# Patient Record
Sex: Male | Born: 1935 | Race: White | Hispanic: No | Marital: Married | Smoking: Never smoker
Health system: Southern US, Community
[De-identification: ages and names within clinical notes are randomized; demographics above are authoritative.]

## PROBLEM LIST (undated history)

## (undated) ENCOUNTER — Inpatient Hospital Stay: Admission: EM | Payer: Self-pay | Source: Home / Self Care

## (undated) DIAGNOSIS — G4730 Sleep apnea, unspecified: Secondary | ICD-10-CM

## (undated) DIAGNOSIS — I480 Paroxysmal atrial fibrillation: Secondary | ICD-10-CM

## (undated) DIAGNOSIS — Z8709 Personal history of other diseases of the respiratory system: Secondary | ICD-10-CM

## (undated) DIAGNOSIS — I10 Essential (primary) hypertension: Secondary | ICD-10-CM

## (undated) DIAGNOSIS — G4733 Obstructive sleep apnea (adult) (pediatric): Secondary | ICD-10-CM

## (undated) DIAGNOSIS — N189 Chronic kidney disease, unspecified: Secondary | ICD-10-CM

## (undated) DIAGNOSIS — Z5111 Encounter for antineoplastic chemotherapy: Secondary | ICD-10-CM

## (undated) DIAGNOSIS — I5032 Chronic diastolic (congestive) heart failure: Secondary | ICD-10-CM

## (undated) DIAGNOSIS — E7800 Pure hypercholesterolemia, unspecified: Secondary | ICD-10-CM

## (undated) DIAGNOSIS — N400 Enlarged prostate without lower urinary tract symptoms: Secondary | ICD-10-CM

## (undated) DIAGNOSIS — D472 Monoclonal gammopathy: Secondary | ICD-10-CM

## (undated) DIAGNOSIS — I495 Sick sinus syndrome: Secondary | ICD-10-CM

## (undated) DIAGNOSIS — Z79899 Other long term (current) drug therapy: Secondary | ICD-10-CM

## (undated) DIAGNOSIS — I350 Nonrheumatic aortic (valve) stenosis: Secondary | ICD-10-CM

## (undated) DIAGNOSIS — C61 Malignant neoplasm of prostate: Secondary | ICD-10-CM

## (undated) DIAGNOSIS — R011 Cardiac murmur, unspecified: Secondary | ICD-10-CM

## (undated) DIAGNOSIS — T8859XA Other complications of anesthesia, initial encounter: Secondary | ICD-10-CM

## (undated) DIAGNOSIS — Z9989 Dependence on other enabling machines and devices: Secondary | ICD-10-CM

## (undated) DIAGNOSIS — N289 Disorder of kidney and ureter, unspecified: Secondary | ICD-10-CM

## (undated) DIAGNOSIS — C9000 Multiple myeloma not having achieved remission: Secondary | ICD-10-CM

## (undated) DIAGNOSIS — Z8679 Personal history of other diseases of the circulatory system: Secondary | ICD-10-CM

## (undated) HISTORY — DX: Pure hypercholesterolemia, unspecified: E78.00

## (undated) HISTORY — DX: Obstructive sleep apnea (adult) (pediatric): G47.33

## (undated) HISTORY — DX: Monoclonal gammopathy: D47.2

## (undated) HISTORY — DX: Personal history of other diseases of the respiratory system: Z87.09

## (undated) HISTORY — DX: Dependence on other enabling machines and devices: Z99.89

## (undated) HISTORY — DX: Disorder of kidney and ureter, unspecified: N28.9

## (undated) HISTORY — DX: Chronic kidney disease, unspecified: N18.9

## (undated) HISTORY — DX: Encounter for antineoplastic chemotherapy: Z51.11

## (undated) HISTORY — DX: Personal history of other diseases of the circulatory system: Z86.79

## (undated) HISTORY — DX: Malignant neoplasm of prostate: C61

## (undated) HISTORY — DX: Essential (primary) hypertension: I10

## (undated) HISTORY — DX: Benign prostatic hyperplasia without lower urinary tract symptoms: N40.0

## (undated) HISTORY — DX: Chronic diastolic (congestive) heart failure: I50.32

## (undated) HISTORY — DX: Nonrheumatic aortic (valve) stenosis: I35.0

## (undated) HISTORY — DX: Sick sinus syndrome: I49.5

## (undated) HISTORY — DX: Multiple myeloma not having achieved remission: C90.00

## (undated) HISTORY — DX: Other long term (current) drug therapy: Z79.899

## (undated) HISTORY — DX: Paroxysmal atrial fibrillation: I48.0

---

## 1969-10-03 HISTORY — PX: KNEE SURGERY: SHX244

## 1982-10-03 HISTORY — PX: GASTRECTOMY: SHX58

## 2008-03-05 ENCOUNTER — Encounter: Payer: Self-pay | Admitting: Emergency Medicine

## 2008-03-05 ENCOUNTER — Inpatient Hospital Stay (HOSPITAL_COMMUNITY): Admission: EM | Admit: 2008-03-05 | Discharge: 2008-03-09 | Payer: Self-pay | Admitting: General Surgery

## 2008-03-05 ENCOUNTER — Encounter (INDEPENDENT_AMBULATORY_CARE_PROVIDER_SITE_OTHER): Payer: Self-pay | Admitting: Surgery

## 2008-04-14 ENCOUNTER — Ambulatory Visit (HOSPITAL_COMMUNITY): Admission: RE | Admit: 2008-04-14 | Discharge: 2008-04-14 | Payer: Self-pay | Admitting: Nurse Practitioner

## 2009-02-09 ENCOUNTER — Ambulatory Visit (HOSPITAL_COMMUNITY): Admission: RE | Admit: 2009-02-09 | Discharge: 2009-02-09 | Payer: Self-pay | Admitting: Urology

## 2009-03-03 ENCOUNTER — Ambulatory Visit: Admission: RE | Admit: 2009-03-03 | Discharge: 2009-03-03 | Payer: Self-pay | Admitting: Urology

## 2009-03-09 DIAGNOSIS — Z8679 Personal history of other diseases of the circulatory system: Secondary | ICD-10-CM

## 2009-03-09 HISTORY — DX: Personal history of other diseases of the circulatory system: Z86.79

## 2009-03-16 ENCOUNTER — Encounter (INDEPENDENT_AMBULATORY_CARE_PROVIDER_SITE_OTHER): Payer: Self-pay | Admitting: Urology

## 2009-03-16 ENCOUNTER — Inpatient Hospital Stay (HOSPITAL_COMMUNITY): Admission: RE | Admit: 2009-03-16 | Discharge: 2009-03-22 | Payer: Self-pay | Admitting: Urology

## 2009-12-05 ENCOUNTER — Inpatient Hospital Stay (HOSPITAL_COMMUNITY): Admission: EM | Admit: 2009-12-05 | Discharge: 2009-12-11 | Payer: Self-pay | Admitting: Emergency Medicine

## 2009-12-08 ENCOUNTER — Ambulatory Visit: Payer: Self-pay | Admitting: Internal Medicine

## 2009-12-25 ENCOUNTER — Ambulatory Visit: Payer: Self-pay | Admitting: Hematology and Oncology

## 2010-01-06 LAB — MORPHOLOGY
PLT EST: ADEQUATE
RBC Comments: NORMAL

## 2010-01-06 LAB — CBC WITH DIFFERENTIAL/PLATELET
BASO%: 0.3 % (ref 0.0–2.0)
Basophils Absolute: 0 10*3/uL (ref 0.0–0.1)
EOS%: 3.4 % (ref 0.0–7.0)
Eosinophils Absolute: 0.2 10*3/uL (ref 0.0–0.5)
HCT: 43.6 % (ref 38.4–49.9)
HGB: 15 g/dL (ref 13.0–17.1)
LYMPH%: 31.6 % (ref 14.0–49.0)
MCH: 32.6 pg (ref 27.2–33.4)
MCHC: 34.4 g/dL (ref 32.0–36.0)
MCV: 94.7 fL (ref 79.3–98.0)
MONO#: 0.7 10*3/uL (ref 0.1–0.9)
MONO%: 10.1 % (ref 0.0–14.0)
NEUT#: 3.6 10*3/uL (ref 1.5–6.5)
NEUT%: 54.6 % (ref 39.0–75.0)
Platelets: 194 10*3/uL (ref 140–400)
RBC: 4.6 10*6/uL (ref 4.20–5.82)
RDW: 13.4 % (ref 11.0–14.6)
WBC: 6.6 10*3/uL (ref 4.0–10.3)
lymph#: 2.1 10*3/uL (ref 0.9–3.3)

## 2010-01-08 LAB — SPEP & IFE WITH QIG
Albumin ELP: 55.1 % — ABNORMAL LOW (ref 55.8–66.1)
Alpha-1-Globulin: 3.6 % (ref 2.9–4.9)
Alpha-2-Globulin: 8.1 % (ref 7.1–11.8)
Beta 2: 3.2 % (ref 3.2–6.5)
Beta Globulin: 5.2 % (ref 4.7–7.2)
Gamma Globulin: 24.8 % — ABNORMAL HIGH (ref 11.1–18.8)
IgA: 72 mg/dL (ref 68–378)
IgG (Immunoglobin G), Serum: 2550 mg/dL — ABNORMAL HIGH (ref 694–1618)
IgM, Serum: 31 mg/dL — ABNORMAL LOW (ref 60–263)
M-Spike, %: 1.54 g/dL
Total Protein, Serum Electrophoresis: 7.5 g/dL (ref 6.0–8.3)

## 2010-01-08 LAB — COMPREHENSIVE METABOLIC PANEL
ALT: 29 U/L (ref 0–53)
AST: 22 U/L (ref 0–37)
Albumin: 4.2 g/dL (ref 3.5–5.2)
Alkaline Phosphatase: 80 U/L (ref 39–117)
BUN: 19 mg/dL (ref 6–23)
CO2: 20 mEq/L (ref 19–32)
Calcium: 8.9 mg/dL (ref 8.4–10.5)
Chloride: 105 mEq/L (ref 96–112)
Creatinine, Ser: 1.83 mg/dL — ABNORMAL HIGH (ref 0.40–1.50)
Glucose, Bld: 94 mg/dL (ref 70–99)
Potassium: 4.6 mEq/L (ref 3.5–5.3)
Sodium: 135 mEq/L (ref 135–145)
Total Bilirubin: 0.6 mg/dL (ref 0.3–1.2)
Total Protein: 7.5 g/dL (ref 6.0–8.3)

## 2010-01-08 LAB — KAPPA/LAMBDA LIGHT CHAINS
Kappa free light chain: 71.3 mg/dL — ABNORMAL HIGH (ref 0.33–1.94)
Kappa:Lambda Ratio: 44.01 — ABNORMAL HIGH (ref 0.26–1.65)
Lambda Free Lght Chn: 1.62 mg/dL (ref 0.57–2.63)

## 2010-01-08 LAB — BETA 2 MICROGLOBULIN, SERUM: Beta-2 Microglobulin: 2.33 mg/L — ABNORMAL HIGH (ref 1.01–1.73)

## 2010-01-14 LAB — UIFE/LIGHT CHAINS/TP QN, 24-HR UR
Albumin, U: DETECTED
Alpha 1, Urine: DETECTED — AB
Alpha 2, Urine: DETECTED — AB
Beta, Urine: DETECTED — AB
Free Kappa Lt Chains,Ur: 2.32 mg/dL — ABNORMAL HIGH (ref 0.04–1.51)
Free Kappa/Lambda Ratio: 17.85 ratio — ABNORMAL HIGH (ref 0.46–4.00)
Free Lambda Excretion/Day: 1.56 mg/d
Free Lambda Lt Chains,Ur: 0.13 mg/dL (ref 0.08–1.01)
Free Lt Chn Excr Rate: 27.84 mg/d
Gamma Globulin, Urine: DETECTED — AB
Time: 24 hours
Total Protein, Urine-Ur/day: 40 mg/d (ref 10–140)
Total Protein, Urine: 3.3 mg/dL
Volume, Urine: 1200 mL

## 2010-01-21 ENCOUNTER — Other Ambulatory Visit: Admission: RE | Admit: 2010-01-21 | Discharge: 2010-01-21 | Payer: Self-pay | Admitting: Hematology and Oncology

## 2010-02-02 ENCOUNTER — Ambulatory Visit: Payer: Self-pay | Admitting: Hematology and Oncology

## 2010-02-18 ENCOUNTER — Encounter (INDEPENDENT_AMBULATORY_CARE_PROVIDER_SITE_OTHER): Payer: Self-pay | Admitting: Interventional Radiology

## 2010-02-18 ENCOUNTER — Ambulatory Visit (HOSPITAL_COMMUNITY): Admission: RE | Admit: 2010-02-18 | Discharge: 2010-02-19 | Payer: Self-pay | Admitting: Nephrology

## 2010-03-09 ENCOUNTER — Ambulatory Visit: Payer: Self-pay | Admitting: Hematology and Oncology

## 2010-06-09 ENCOUNTER — Ambulatory Visit: Payer: Self-pay | Admitting: Hematology and Oncology

## 2010-06-09 LAB — CBC WITH DIFFERENTIAL/PLATELET
BASO%: 0.1 % (ref 0.0–2.0)
Basophils Absolute: 0 10*3/uL (ref 0.0–0.1)
EOS%: 1.7 % (ref 0.0–7.0)
Eosinophils Absolute: 0.1 10*3/uL (ref 0.0–0.5)
HCT: 47.4 % (ref 38.4–49.9)
HGB: 15.8 g/dL (ref 13.0–17.1)
LYMPH%: 37.7 % (ref 14.0–49.0)
MCH: 31.6 pg (ref 27.2–33.4)
MCHC: 33.4 g/dL (ref 32.0–36.0)
MCV: 94.6 fL (ref 79.3–98.0)
MONO#: 0.5 10*3/uL (ref 0.1–0.9)
MONO%: 7 % (ref 0.0–14.0)
NEUT#: 3.7 10*3/uL (ref 1.5–6.5)
NEUT%: 53.5 % (ref 39.0–75.0)
Platelets: 218 10*3/uL (ref 140–400)
RBC: 5.01 10*6/uL (ref 4.20–5.82)
RDW: 15.5 % — ABNORMAL HIGH (ref 11.0–14.6)
WBC: 6.8 10*3/uL (ref 4.0–10.3)
lymph#: 2.6 10*3/uL (ref 0.9–3.3)

## 2010-06-11 LAB — COMPREHENSIVE METABOLIC PANEL
ALT: 18 U/L (ref 0–53)
AST: 19 U/L (ref 0–37)
Albumin: 3.9 g/dL (ref 3.5–5.2)
Alkaline Phosphatase: 85 U/L (ref 39–117)
BUN: 21 mg/dL (ref 6–23)
CO2: 21 mEq/L (ref 19–32)
Calcium: 9.2 mg/dL (ref 8.4–10.5)
Chloride: 106 mEq/L (ref 96–112)
Creatinine, Ser: 1.68 mg/dL — ABNORMAL HIGH (ref 0.40–1.50)
Glucose, Bld: 145 mg/dL — ABNORMAL HIGH (ref 70–99)
Potassium: 4.1 mEq/L (ref 3.5–5.3)
Sodium: 138 mEq/L (ref 135–145)
Total Bilirubin: 0.6 mg/dL (ref 0.3–1.2)
Total Protein: 7.6 g/dL (ref 6.0–8.3)

## 2010-06-11 LAB — SPEP & IFE WITH QIG
Albumin ELP: 57.1 % (ref 55.8–66.1)
Alpha-1-Globulin: 3 % (ref 2.9–4.9)
Alpha-2-Globulin: 7.8 % (ref 7.1–11.8)
Beta 2: 2.1 % — ABNORMAL LOW (ref 3.2–6.5)
Beta Globulin: 5 % (ref 4.7–7.2)
Gamma Globulin: 25 % — ABNORMAL HIGH (ref 11.1–18.8)
IgA: 57 mg/dL — ABNORMAL LOW (ref 68–378)
IgG (Immunoglobin G), Serum: 2670 mg/dL — ABNORMAL HIGH (ref 694–1618)
IgM, Serum: 29 mg/dL — ABNORMAL LOW (ref 60–263)
M-Spike, %: 1.63 g/dL
Total Protein, Serum Electrophoresis: 7.6 g/dL (ref 6.0–8.3)

## 2010-06-11 LAB — KAPPA/LAMBDA LIGHT CHAINS
Kappa free light chain: 79.9 mg/dL — ABNORMAL HIGH (ref 0.33–1.94)
Kappa:Lambda Ratio: 74.67 — ABNORMAL HIGH (ref 0.26–1.65)
Lambda Free Lght Chn: 1.07 mg/dL (ref 0.57–2.63)

## 2010-06-11 LAB — BETA 2 MICROGLOBULIN, SERUM: Beta-2 Microglobulin: 2.85 mg/L — ABNORMAL HIGH (ref 1.01–1.73)

## 2010-10-14 ENCOUNTER — Ambulatory Visit: Payer: Self-pay | Admitting: Hematology and Oncology

## 2010-10-18 LAB — CBC WITH DIFFERENTIAL/PLATELET
BASO%: 0.2 % (ref 0.0–2.0)
Basophils Absolute: 0 10*3/uL (ref 0.0–0.1)
EOS%: 2.9 % (ref 0.0–7.0)
Eosinophils Absolute: 0.2 10*3/uL (ref 0.0–0.5)
HCT: 46.5 % (ref 38.4–49.9)
HGB: 15.7 g/dL (ref 13.0–17.1)
LYMPH%: 38.4 % (ref 14.0–49.0)
MCH: 32.3 pg (ref 27.2–33.4)
MCHC: 33.8 g/dL (ref 32.0–36.0)
MCV: 95.6 fL (ref 79.3–98.0)
MONO#: 0.6 10*3/uL (ref 0.1–0.9)
MONO%: 7.9 % (ref 0.0–14.0)
NEUT#: 3.7 10*3/uL (ref 1.5–6.5)
NEUT%: 50.6 % (ref 39.0–75.0)
Platelets: 216 10*3/uL (ref 140–400)
RBC: 4.86 10*6/uL (ref 4.20–5.82)
RDW: 13.9 % (ref 11.0–14.6)
WBC: 7.3 10*3/uL (ref 4.0–10.3)
lymph#: 2.8 10*3/uL (ref 0.9–3.3)

## 2010-10-20 LAB — KAPPA/LAMBDA LIGHT CHAINS
Kappa free light chain: 88.5 mg/dL — ABNORMAL HIGH (ref 0.33–1.94)
Kappa:Lambda Ratio: 89.39 — ABNORMAL HIGH (ref 0.26–1.65)
Lambda Free Lght Chn: 0.99 mg/dL (ref 0.57–2.63)

## 2010-10-20 LAB — COMPREHENSIVE METABOLIC PANEL
ALT: 23 U/L (ref 0–53)
AST: 22 U/L (ref 0–37)
Albumin: 4.3 g/dL (ref 3.5–5.2)
Alkaline Phosphatase: 86 U/L (ref 39–117)
BUN: 22 mg/dL (ref 6–23)
CO2: 25 mEq/L (ref 19–32)
Calcium: 9.8 mg/dL (ref 8.4–10.5)
Chloride: 104 mEq/L (ref 96–112)
Creatinine, Ser: 1.66 mg/dL — ABNORMAL HIGH (ref 0.40–1.50)
Glucose, Bld: 119 mg/dL — ABNORMAL HIGH (ref 70–99)
Potassium: 4.5 mEq/L (ref 3.5–5.3)
Sodium: 139 mEq/L (ref 135–145)
Total Bilirubin: 0.5 mg/dL (ref 0.3–1.2)
Total Protein: 7.8 g/dL (ref 6.0–8.3)

## 2010-10-20 LAB — SPEP & IFE WITH QIG
Albumin ELP: 56 % (ref 55.8–66.1)
Alpha-1-Globulin: 3.3 % (ref 2.9–4.9)
Alpha-2-Globulin: 7.9 % (ref 7.1–11.8)
Beta 2: 3.1 % — ABNORMAL LOW (ref 3.2–6.5)
Beta Globulin: 5.2 % (ref 4.7–7.2)
Gamma Globulin: 24.5 % — ABNORMAL HIGH (ref 11.1–18.8)
IgA: 68 mg/dL (ref 68–378)
IgG (Immunoglobin G), Serum: 3080 mg/dL — ABNORMAL HIGH (ref 694–1618)
IgM, Serum: 32 mg/dL — ABNORMAL LOW (ref 60–263)
M-Spike, %: 1.61 g/dL
Total Protein, Serum Electrophoresis: 7.8 g/dL (ref 6.0–8.3)

## 2010-10-20 LAB — BETA 2 MICROGLOBULIN, SERUM: Beta-2 Microglobulin: 2.36 mg/L — ABNORMAL HIGH (ref 1.01–1.73)

## 2010-10-20 LAB — LACTATE DEHYDROGENASE: LDH: 161 U/L (ref 94–250)

## 2010-10-22 ENCOUNTER — Other Ambulatory Visit: Payer: Self-pay | Admitting: Hematology and Oncology

## 2010-10-22 DIAGNOSIS — C9000 Multiple myeloma not having achieved remission: Secondary | ICD-10-CM

## 2010-12-20 LAB — CBC
HCT: 36.5 % — ABNORMAL LOW (ref 39.0–52.0)
HCT: 36.8 % — ABNORMAL LOW (ref 39.0–52.0)
HCT: 38.2 % — ABNORMAL LOW (ref 39.0–52.0)
HCT: 39.2 % (ref 39.0–52.0)
HCT: 42.2 % (ref 39.0–52.0)
HCT: 43.7 % (ref 39.0–52.0)
HCT: 44.6 % (ref 39.0–52.0)
Hemoglobin: 12.7 g/dL — ABNORMAL LOW (ref 13.0–17.0)
Hemoglobin: 12.8 g/dL — ABNORMAL LOW (ref 13.0–17.0)
Hemoglobin: 13 g/dL (ref 13.0–17.0)
Hemoglobin: 13.5 g/dL (ref 13.0–17.0)
Hemoglobin: 14.5 g/dL (ref 13.0–17.0)
Hemoglobin: 15.1 g/dL (ref 13.0–17.0)
Hemoglobin: 15.5 g/dL (ref 13.0–17.0)
MCHC: 34.1 g/dL (ref 30.0–36.0)
MCHC: 34.4 g/dL (ref 30.0–36.0)
MCHC: 34.5 g/dL (ref 30.0–36.0)
MCHC: 34.5 g/dL (ref 30.0–36.0)
MCHC: 34.7 g/dL (ref 30.0–36.0)
MCHC: 34.8 g/dL (ref 30.0–36.0)
MCHC: 34.9 g/dL (ref 30.0–36.0)
MCV: 94.4 fL (ref 78.0–100.0)
MCV: 95.4 fL (ref 78.0–100.0)
MCV: 95.6 fL (ref 78.0–100.0)
MCV: 95.7 fL (ref 78.0–100.0)
MCV: 95.8 fL (ref 78.0–100.0)
MCV: 95.8 fL (ref 78.0–100.0)
MCV: 96 fL (ref 78.0–100.0)
Platelets: 163 10*3/uL (ref 150–400)
Platelets: 184 10*3/uL (ref 150–400)
Platelets: 202 10*3/uL (ref 150–400)
Platelets: 213 10*3/uL (ref 150–400)
Platelets: 222 10*3/uL (ref 150–400)
Platelets: 225 10*3/uL (ref 150–400)
Platelets: 253 10*3/uL (ref 150–400)
RBC: 3.81 MIL/uL — ABNORMAL LOW (ref 4.22–5.81)
RBC: 3.85 MIL/uL — ABNORMAL LOW (ref 4.22–5.81)
RBC: 3.98 MIL/uL — ABNORMAL LOW (ref 4.22–5.81)
RBC: 4.09 MIL/uL — ABNORMAL LOW (ref 4.22–5.81)
RBC: 4.42 MIL/uL (ref 4.22–5.81)
RBC: 4.57 MIL/uL (ref 4.22–5.81)
RBC: 4.73 MIL/uL (ref 4.22–5.81)
RDW: 14.2 % (ref 11.5–15.5)
RDW: 14.3 % (ref 11.5–15.5)
RDW: 14.4 % (ref 11.5–15.5)
RDW: 14.5 % (ref 11.5–15.5)
RDW: 14.6 % (ref 11.5–15.5)
RDW: 14.6 % (ref 11.5–15.5)
RDW: 14.7 % (ref 11.5–15.5)
WBC: 10.6 10*3/uL — ABNORMAL HIGH (ref 4.0–10.5)
WBC: 11.1 10*3/uL — ABNORMAL HIGH (ref 4.0–10.5)
WBC: 14.1 10*3/uL — ABNORMAL HIGH (ref 4.0–10.5)
WBC: 6.7 10*3/uL (ref 4.0–10.5)
WBC: 9 10*3/uL (ref 4.0–10.5)
WBC: 9.8 10*3/uL (ref 4.0–10.5)
WBC: 9.9 10*3/uL (ref 4.0–10.5)

## 2010-12-20 LAB — PROTIME-INR
INR: 0.97 (ref 0.00–1.49)
INR: 1.01 (ref 0.00–1.49)
Prothrombin Time: 12.8 seconds (ref 11.6–15.2)
Prothrombin Time: 13.2 seconds (ref 11.6–15.2)

## 2010-12-20 LAB — APTT
aPTT: 31 seconds (ref 24–37)
aPTT: 33 seconds (ref 24–37)

## 2010-12-20 LAB — COMPREHENSIVE METABOLIC PANEL
ALT: 31 U/L (ref 0–53)
AST: 34 U/L (ref 0–37)
Albumin: 3.2 g/dL — ABNORMAL LOW (ref 3.5–5.2)
Alkaline Phosphatase: 74 U/L (ref 39–117)
BUN: 13 mg/dL (ref 6–23)
CO2: 29 mEq/L (ref 19–32)
Calcium: 8.5 mg/dL (ref 8.4–10.5)
Chloride: 107 mEq/L (ref 96–112)
Creatinine, Ser: 1.59 mg/dL — ABNORMAL HIGH (ref 0.4–1.5)
GFR calc Af Amer: 52 mL/min — ABNORMAL LOW (ref >60)
GFR calc non Af Amer: 43 mL/min — ABNORMAL LOW (ref >60)
Glucose, Bld: 99 mg/dL (ref 70–99)
Potassium: 4.5 mEq/L (ref 3.5–5.1)
Sodium: 139 mEq/L (ref 135–145)
Total Bilirubin: 1 mg/dL (ref 0.3–1.2)
Total Protein: 6.5 g/dL (ref 6.0–8.3)

## 2010-12-20 LAB — GLUCOSE, CAPILLARY: Glucose-Capillary: 172 mg/dL — ABNORMAL HIGH (ref 70–99)

## 2010-12-20 LAB — MRSA PCR SCREENING: MRSA by PCR: NEGATIVE

## 2010-12-21 LAB — DIFFERENTIAL
Basophils Absolute: 0 10*3/uL (ref 0.0–0.1)
Basophils Relative: 0 % (ref 0–1)
Eosinophils Absolute: 0.2 10*3/uL (ref 0.0–0.7)
Eosinophils Relative: 3 % (ref 0–5)
Lymphocytes Relative: 34 % (ref 12–46)
Lymphs Abs: 2.2 10*3/uL (ref 0.7–4.0)
Monocytes Absolute: 0.5 10*3/uL (ref 0.1–1.0)
Monocytes Relative: 8 % (ref 3–12)
Neutro Abs: 3.4 10*3/uL (ref 1.7–7.7)
Neutrophils Relative %: 55 % (ref 43–77)

## 2010-12-21 LAB — CBC
HCT: 43 % (ref 39.0–52.0)
Hemoglobin: 14.6 g/dL (ref 13.0–17.0)
MCHC: 33.8 g/dL (ref 30.0–36.0)
MCV: 95.3 fL (ref 78.0–100.0)
Platelets: 244 10*3/uL (ref 150–400)
RBC: 4.52 MIL/uL (ref 4.22–5.81)
RDW: 14.2 % (ref 11.5–15.5)
WBC: 6.3 10*3/uL (ref 4.0–10.5)

## 2010-12-21 LAB — TISSUE HYBRIDIZATION (BONE MARROW)-NCBH

## 2010-12-21 LAB — BONE MARROW EXAM

## 2010-12-21 LAB — CHROMOSOME ANALYSIS, BONE MARROW

## 2010-12-27 LAB — CBC
HCT: 39.3 % (ref 39.0–52.0)
HCT: 39.5 % (ref 39.0–52.0)
HCT: 40 % (ref 39.0–52.0)
HCT: 40.7 % (ref 39.0–52.0)
HCT: 43.4 % (ref 39.0–52.0)
HCT: 50.4 % (ref 39.0–52.0)
Hemoglobin: 13.4 g/dL (ref 13.0–17.0)
Hemoglobin: 13.6 g/dL (ref 13.0–17.0)
Hemoglobin: 13.8 g/dL (ref 13.0–17.0)
Hemoglobin: 13.8 g/dL (ref 13.0–17.0)
Hemoglobin: 14.8 g/dL (ref 13.0–17.0)
Hemoglobin: 17.2 g/dL — ABNORMAL HIGH (ref 13.0–17.0)
MCHC: 33.9 g/dL (ref 30.0–36.0)
MCHC: 33.9 g/dL (ref 30.0–36.0)
MCHC: 34.1 g/dL (ref 30.0–36.0)
MCHC: 34.1 g/dL (ref 30.0–36.0)
MCHC: 34.3 g/dL (ref 30.0–36.0)
MCHC: 34.5 g/dL (ref 30.0–36.0)
MCV: 96.6 fL (ref 78.0–100.0)
MCV: 96.7 fL (ref 78.0–100.0)
MCV: 96.9 fL (ref 78.0–100.0)
MCV: 97.2 fL (ref 78.0–100.0)
MCV: 97.2 fL (ref 78.0–100.0)
MCV: 98.4 fL (ref 78.0–100.0)
Platelets: 121 10*3/uL — ABNORMAL LOW (ref 150–400)
Platelets: 148 K/uL — ABNORMAL LOW (ref 150–400)
Platelets: 150 10*3/uL (ref 150–400)
Platelets: 189 K/uL (ref 150–400)
Platelets: 193 10*3/uL (ref 150–400)
Platelets: 216 10*3/uL (ref 150–400)
RBC: 4.05 MIL/uL — ABNORMAL LOW (ref 4.22–5.81)
RBC: 4.09 MIL/uL — ABNORMAL LOW (ref 4.22–5.81)
RBC: 4.14 MIL/uL — ABNORMAL LOW (ref 4.22–5.81)
RBC: 4.15 MIL/uL — ABNORMAL LOW (ref 4.22–5.81)
RBC: 4.46 MIL/uL (ref 4.22–5.81)
RBC: 5.21 MIL/uL (ref 4.22–5.81)
RDW: 13.3 % (ref 11.5–15.5)
RDW: 13.4 % (ref 11.5–15.5)
RDW: 13.6 % (ref 11.5–15.5)
RDW: 13.7 % (ref 11.5–15.5)
RDW: 13.7 % (ref 11.5–15.5)
RDW: 13.8 % (ref 11.5–15.5)
WBC: 10.9 K/uL — ABNORMAL HIGH (ref 4.0–10.5)
WBC: 15.6 10*3/uL — ABNORMAL HIGH (ref 4.0–10.5)
WBC: 16.9 10*3/uL — ABNORMAL HIGH (ref 4.0–10.5)
WBC: 21 10*3/uL — ABNORMAL HIGH (ref 4.0–10.5)
WBC: 8.9 K/uL (ref 4.0–10.5)
WBC: 9.9 10*3/uL (ref 4.0–10.5)

## 2010-12-27 LAB — DIFFERENTIAL
Basophils Absolute: 0 10*3/uL (ref 0.0–0.1)
Basophils Relative: 0 % (ref 0–1)
Eosinophils Absolute: 0 10*3/uL (ref 0.0–0.7)
Eosinophils Relative: 0 % (ref 0–5)
Lymphocytes Relative: 7 % — ABNORMAL LOW (ref 12–46)
Lymphs Abs: 1.2 10*3/uL (ref 0.7–4.0)
Monocytes Absolute: 0.8 10*3/uL (ref 0.1–1.0)
Monocytes Relative: 5 % (ref 3–12)
Neutro Abs: 14.9 10*3/uL — ABNORMAL HIGH (ref 1.7–7.7)
Neutrophils Relative %: 88 % — ABNORMAL HIGH (ref 43–77)

## 2010-12-27 LAB — CULTURE, BLOOD (ROUTINE X 2)
Culture: NO GROWTH
Culture: NO GROWTH

## 2010-12-27 LAB — URINE CULTURE: Colony Count: 10000

## 2010-12-27 LAB — URINALYSIS, ROUTINE W REFLEX MICROSCOPIC
Bilirubin Urine: NEGATIVE
Glucose, UA: NEGATIVE mg/dL
Ketones, ur: 15 mg/dL — AB
Nitrite: NEGATIVE
Protein, ur: 30 mg/dL — AB
Specific Gravity, Urine: 1.022 (ref 1.005–1.030)
Urobilinogen, UA: 1 mg/dL (ref 0.0–1.0)
pH: 5 (ref 5.0–8.0)

## 2010-12-27 LAB — BASIC METABOLIC PANEL WITH GFR
BUN: 22 mg/dL (ref 6–23)
BUN: 27 mg/dL — ABNORMAL HIGH (ref 6–23)
CO2: 23 meq/L (ref 19–32)
CO2: 25 meq/L (ref 19–32)
Calcium: 8 mg/dL — ABNORMAL LOW (ref 8.4–10.5)
Calcium: 8.4 mg/dL (ref 8.4–10.5)
Chloride: 107 meq/L (ref 96–112)
Chloride: 107 meq/L (ref 96–112)
Creatinine, Ser: 1.57 mg/dL — ABNORMAL HIGH (ref 0.4–1.5)
Creatinine, Ser: 1.86 mg/dL — ABNORMAL HIGH (ref 0.4–1.5)
GFR calc non Af Amer: 36 mL/min — ABNORMAL LOW
GFR calc non Af Amer: 44 mL/min — ABNORMAL LOW
Glucose, Bld: 101 mg/dL — ABNORMAL HIGH (ref 70–99)
Glucose, Bld: 141 mg/dL — ABNORMAL HIGH (ref 70–99)
Potassium: 3.9 meq/L (ref 3.5–5.1)
Potassium: 4.4 meq/L (ref 3.5–5.1)
Sodium: 135 meq/L (ref 135–145)
Sodium: 136 meq/L (ref 135–145)

## 2010-12-27 LAB — CARDIAC PANEL(CRET KIN+CKTOT+MB+TROPI)
CK, MB: 1.8 ng/mL (ref 0.3–4.0)
CK, MB: 2.7 ng/mL (ref 0.3–4.0)
CK, MB: 4.5 ng/mL — ABNORMAL HIGH (ref 0.3–4.0)
Relative Index: 0.6 (ref 0.0–2.5)
Relative Index: 0.8 (ref 0.0–2.5)
Relative Index: 0.8 (ref 0.0–2.5)
Total CK: 233 U/L — ABNORMAL HIGH (ref 7–232)
Total CK: 426 U/L — ABNORMAL HIGH (ref 7–232)
Total CK: 546 U/L — ABNORMAL HIGH (ref 7–232)
Troponin I: 0.13 ng/mL — ABNORMAL HIGH (ref 0.00–0.06)

## 2010-12-27 LAB — POCT I-STAT, CHEM 8
BUN: 21 mg/dL (ref 6–23)
Calcium, Ion: 1.08 mmol/L — ABNORMAL LOW (ref 1.12–1.32)
Chloride: 106 mEq/L (ref 96–112)
Creatinine, Ser: 2 mg/dL — ABNORMAL HIGH (ref 0.4–1.5)
Glucose, Bld: 128 mg/dL — ABNORMAL HIGH (ref 70–99)
HCT: 53 % — ABNORMAL HIGH (ref 39.0–52.0)
Hemoglobin: 18 g/dL — ABNORMAL HIGH (ref 13.0–17.0)
Potassium: 4.4 mEq/L (ref 3.5–5.1)
Sodium: 138 mEq/L (ref 135–145)
TCO2: 23 mmol/L (ref 0–100)

## 2010-12-27 LAB — BASIC METABOLIC PANEL
BUN: 18 mg/dL (ref 6–23)
BUN: 18 mg/dL (ref 6–23)
BUN: 21 mg/dL (ref 6–23)
CO2: 22 mEq/L (ref 19–32)
CO2: 26 mEq/L (ref 19–32)
CO2: 29 mEq/L (ref 19–32)
Calcium: 8.2 mg/dL — ABNORMAL LOW (ref 8.4–10.5)
Calcium: 8.7 mg/dL (ref 8.4–10.5)
Calcium: 9 mg/dL (ref 8.4–10.5)
Chloride: 104 mEq/L (ref 96–112)
Chloride: 111 mEq/L (ref 96–112)
Chloride: 111 mEq/L (ref 96–112)
Creatinine, Ser: 1.52 mg/dL — ABNORMAL HIGH (ref 0.4–1.5)
Creatinine, Ser: 1.65 mg/dL — ABNORMAL HIGH (ref 0.4–1.5)
Creatinine, Ser: 2.14 mg/dL — ABNORMAL HIGH (ref 0.4–1.5)
GFR calc Af Amer: 37 mL/min — ABNORMAL LOW (ref >60)
GFR calc Af Amer: 50 mL/min — ABNORMAL LOW (ref >60)
GFR calc Af Amer: 55 mL/min — ABNORMAL LOW (ref >60)
GFR calc non Af Amer: 31 mL/min — ABNORMAL LOW (ref >60)
GFR calc non Af Amer: 41 mL/min — ABNORMAL LOW (ref >60)
GFR calc non Af Amer: 45 mL/min — ABNORMAL LOW (ref >60)
Glucose, Bld: 156 mg/dL — ABNORMAL HIGH (ref 70–99)
Glucose, Bld: 92 mg/dL (ref 70–99)
Glucose, Bld: 99 mg/dL (ref 70–99)
Potassium: 3.8 mEq/L (ref 3.5–5.1)
Potassium: 3.9 mEq/L (ref 3.5–5.1)
Potassium: 3.9 mEq/L (ref 3.5–5.1)
Sodium: 134 mEq/L — ABNORMAL LOW (ref 135–145)
Sodium: 141 mEq/L (ref 135–145)
Sodium: 144 mEq/L (ref 135–145)

## 2010-12-27 LAB — COMPREHENSIVE METABOLIC PANEL
ALT: 33 U/L (ref 0–53)
AST: 33 U/L (ref 0–37)
Albumin: 3.7 g/dL (ref 3.5–5.2)
Alkaline Phosphatase: 87 U/L (ref 39–117)
BUN: 20 mg/dL (ref 6–23)
CO2: 22 mEq/L (ref 19–32)
Calcium: 8.9 mg/dL (ref 8.4–10.5)
Chloride: 106 mEq/L (ref 96–112)
Creatinine, Ser: 2.03 mg/dL — ABNORMAL HIGH (ref 0.4–1.5)
GFR calc Af Amer: 39 mL/min — ABNORMAL LOW (ref >60)
GFR calc non Af Amer: 32 mL/min — ABNORMAL LOW (ref >60)
Glucose, Bld: 136 mg/dL — ABNORMAL HIGH (ref 70–99)
Potassium: 3.8 mEq/L (ref 3.5–5.1)
Sodium: 135 mEq/L (ref 135–145)
Total Bilirubin: 1.3 mg/dL — ABNORMAL HIGH (ref 0.3–1.2)
Total Protein: 8 g/dL (ref 6.0–8.3)

## 2010-12-27 LAB — RAPID STREP SCREEN (MED CTR MEBANE ONLY): Streptococcus, Group A Screen (Direct): POSITIVE — AB

## 2010-12-27 LAB — PROTIME-INR
INR: 1.29 (ref 0.00–1.49)
Prothrombin Time: 16 s — ABNORMAL HIGH (ref 11.6–15.2)

## 2010-12-27 LAB — TSH: TSH: 1.41 u[IU]/mL (ref 0.350–4.500)

## 2010-12-27 LAB — URINE MICROSCOPIC-ADD ON

## 2010-12-27 LAB — MAGNESIUM: Magnesium: 1.8 mg/dL (ref 1.5–2.5)

## 2010-12-27 LAB — POCT CARDIAC MARKERS
CKMB, poc: <1 ng/mL — ABNORMAL LOW (ref 1.0–8.0)
Myoglobin, poc: 408 ng/mL (ref 12–200)
Troponin i, poc: <0.05 ng/mL (ref 0.00–0.09)

## 2010-12-27 LAB — MRSA PCR SCREENING: MRSA by PCR: NEGATIVE

## 2011-01-10 LAB — BASIC METABOLIC PANEL
BUN: 10 mg/dL (ref 6–23)
BUN: 12 mg/dL (ref 6–23)
BUN: 17 mg/dL (ref 6–23)
BUN: 14 mg/dL (ref 6–23)
BUN: 16 mg/dL (ref 6–23)
BUN: 16 mg/dL (ref 6–23)
CO2: 23 mEq/L (ref 19–32)
CO2: 24 mEq/L (ref 19–32)
CO2: 27 mEq/L (ref 19–32)
CO2: 25 mEq/L (ref 19–32)
CO2: 29 mEq/L (ref 19–32)
CO2: 29 mEq/L (ref 19–32)
Calcium: 9.2 mg/dL (ref 8.4–10.5)
Calcium: 9.2 mg/dL (ref 8.4–10.5)
Calcium: 9.3 mg/dL (ref 8.4–10.5)
Calcium: 8 mg/dL — ABNORMAL LOW (ref 8.4–10.5)
Calcium: 8.4 mg/dL (ref 8.4–10.5)
Calcium: 9.4 mg/dL (ref 8.4–10.5)
Chloride: 104 mEq/L (ref 96–112)
Chloride: 104 mEq/L (ref 96–112)
Chloride: 107 mEq/L (ref 96–112)
Chloride: 104 mEq/L (ref 96–112)
Chloride: 106 mEq/L (ref 96–112)
Chloride: 108 mEq/L (ref 96–112)
Creatinine, Ser: 1.44 mg/dL (ref 0.4–1.5)
Creatinine, Ser: 1.45 mg/dL (ref 0.4–1.5)
Creatinine, Ser: 1.55 mg/dL — ABNORMAL HIGH (ref 0.4–1.5)
Creatinine, Ser: 1.38 mg/dL (ref 0.4–1.5)
Creatinine, Ser: 1.49 mg/dL (ref 0.4–1.5)
Creatinine, Ser: 1.62 mg/dL — ABNORMAL HIGH (ref 0.4–1.5)
GFR calc Af Amer: 54 mL/min — ABNORMAL LOW (ref >60)
GFR calc Af Amer: 58 mL/min — ABNORMAL LOW (ref >60)
GFR calc Af Amer: 58 mL/min — ABNORMAL LOW (ref >60)
GFR calc Af Amer: 51 mL/min — ABNORMAL LOW (ref >60)
GFR calc Af Amer: 56 mL/min — ABNORMAL LOW (ref >60)
GFR calc Af Amer: >60 mL/min (ref >60)
GFR calc non Af Amer: 44 mL/min — ABNORMAL LOW (ref >60)
GFR calc non Af Amer: 48 mL/min — ABNORMAL LOW (ref >60)
GFR calc non Af Amer: 48 mL/min — ABNORMAL LOW (ref >60)
GFR calc non Af Amer: 42 mL/min — ABNORMAL LOW (ref >60)
GFR calc non Af Amer: 46 mL/min — ABNORMAL LOW (ref >60)
GFR calc non Af Amer: 51 mL/min — ABNORMAL LOW (ref >60)
Glucose, Bld: 102 mg/dL — ABNORMAL HIGH (ref 70–99)
Glucose, Bld: 102 mg/dL — ABNORMAL HIGH (ref 70–99)
Glucose, Bld: 116 mg/dL — ABNORMAL HIGH (ref 70–99)
Glucose, Bld: 111 mg/dL — ABNORMAL HIGH (ref 70–99)
Glucose, Bld: 121 mg/dL — ABNORMAL HIGH (ref 70–99)
Glucose, Bld: 146 mg/dL — ABNORMAL HIGH (ref 70–99)
Potassium: 3.2 mEq/L — ABNORMAL LOW (ref 3.5–5.1)
Potassium: 3.4 mEq/L — ABNORMAL LOW (ref 3.5–5.1)
Potassium: 3.6 mEq/L (ref 3.5–5.1)
Potassium: 4.1 mEq/L (ref 3.5–5.1)
Potassium: 4.1 mEq/L (ref 3.5–5.1)
Potassium: 4.3 mEq/L (ref 3.5–5.1)
Sodium: 136 mEq/L (ref 135–145)
Sodium: 136 mEq/L (ref 135–145)
Sodium: 139 mEq/L (ref 135–145)
Sodium: 134 mEq/L — ABNORMAL LOW (ref 135–145)
Sodium: 137 mEq/L (ref 135–145)
Sodium: 140 mEq/L (ref 135–145)

## 2011-01-10 LAB — CARDIAC PANEL(CRET KIN+CKTOT+MB+TROPI)
CK, MB: 2.8 ng/mL (ref 0.3–4.0)
CK, MB: 3.7 ng/mL (ref 0.3–4.0)
Relative Index: 1.4 (ref 0.0–2.5)
Relative Index: 1.6 (ref 0.0–2.5)
Total CK: 204 U/L (ref 7–232)
Total CK: 235 U/L — ABNORMAL HIGH (ref 7–232)
Troponin I: 0.09 ng/mL — ABNORMAL HIGH (ref 0.00–0.06)
Troponin I: 0.15 ng/mL — ABNORMAL HIGH (ref 0.00–0.06)

## 2011-01-10 LAB — COMPREHENSIVE METABOLIC PANEL
ALT: 45 U/L (ref 0–53)
AST: 33 U/L (ref 0–37)
Albumin: 3.5 g/dL (ref 3.5–5.2)
Alkaline Phosphatase: 70 U/L (ref 39–117)
BUN: 8 mg/dL (ref 6–23)
CO2: 26 mEq/L (ref 19–32)
Calcium: 8.3 mg/dL — ABNORMAL LOW (ref 8.4–10.5)
Chloride: 105 mEq/L (ref 96–112)
Creatinine, Ser: 1.4 mg/dL (ref 0.4–1.5)
GFR calc Af Amer: >60 mL/min (ref >60)
GFR calc non Af Amer: 50 mL/min — ABNORMAL LOW (ref >60)
Glucose, Bld: 134 mg/dL — ABNORMAL HIGH (ref 70–99)
Potassium: 3.8 mEq/L (ref 3.5–5.1)
Sodium: 136 mEq/L (ref 135–145)
Total Bilirubin: 1.4 mg/dL — ABNORMAL HIGH (ref 0.3–1.2)
Total Protein: 7 g/dL (ref 6.0–8.3)

## 2011-01-10 LAB — HEMOGLOBIN AND HEMATOCRIT, BLOOD
HCT: 38 % — ABNORMAL LOW (ref 39.0–52.0)
HCT: 44 % (ref 39.0–52.0)
Hemoglobin: 13.4 g/dL (ref 13.0–17.0)
Hemoglobin: 15.3 g/dL (ref 13.0–17.0)

## 2011-01-10 LAB — CBC
HCT: 42.2 % (ref 39.0–52.0)
HCT: 40.5 % (ref 39.0–52.0)
HCT: 45.2 % (ref 39.0–52.0)
HCT: 47.9 % (ref 39.0–52.0)
Hemoglobin: 14.8 g/dL (ref 13.0–17.0)
Hemoglobin: 14.4 g/dL (ref 13.0–17.0)
Hemoglobin: 15.6 g/dL (ref 13.0–17.0)
Hemoglobin: 16.2 g/dL (ref 13.0–17.0)
MCHC: 35.1 g/dL (ref 30.0–36.0)
MCHC: 33.8 g/dL (ref 30.0–36.0)
MCHC: 34.5 g/dL (ref 30.0–36.0)
MCHC: 35.7 g/dL (ref 30.0–36.0)
MCV: 93.9 fL (ref 78.0–100.0)
MCV: 95.2 fL (ref 78.0–100.0)
MCV: 95.5 fL (ref 78.0–100.0)
MCV: 95.6 fL (ref 78.0–100.0)
Platelets: 162 10*3/uL (ref 150–400)
Platelets: 180 10*3/uL (ref 150–400)
Platelets: 197 10*3/uL (ref 150–400)
Platelets: 219 10*3/uL (ref 150–400)
RBC: 4.5 MIL/uL (ref 4.22–5.81)
RBC: 4.25 MIL/uL (ref 4.22–5.81)
RBC: 4.73 MIL/uL (ref 4.22–5.81)
RBC: 5.01 MIL/uL (ref 4.22–5.81)
RDW: 13.4 % (ref 11.5–15.5)
RDW: 12.8 % (ref 11.5–15.5)
RDW: 13.5 % (ref 11.5–15.5)
RDW: 13.5 % (ref 11.5–15.5)
WBC: 13.1 10*3/uL — ABNORMAL HIGH (ref 4.0–10.5)
WBC: 15.9 10*3/uL — ABNORMAL HIGH (ref 4.0–10.5)
WBC: 7.1 10*3/uL (ref 4.0–10.5)
WBC: 9.5 10*3/uL (ref 4.0–10.5)

## 2011-01-10 LAB — PROTIME-INR
INR: 1.1 (ref 0.00–1.49)
INR: 1.2 (ref 0.00–1.49)
INR: 2.6 — ABNORMAL HIGH (ref 0.00–1.49)
Prothrombin Time: 14.4 seconds (ref 11.6–15.2)
Prothrombin Time: 15.7 seconds — ABNORMAL HIGH (ref 11.6–15.2)
Prothrombin Time: 29.5 seconds — ABNORMAL HIGH (ref 11.6–15.2)

## 2011-01-10 LAB — BRAIN NATRIURETIC PEPTIDE: Pro B Natriuretic peptide (BNP): 318 pg/mL — ABNORMAL HIGH (ref 0.0–100.0)

## 2011-01-10 LAB — DIFFERENTIAL
Basophils Absolute: 0 10*3/uL (ref 0.0–0.1)
Basophils Relative: 0 % (ref 0–1)
Eosinophils Absolute: 0.2 10*3/uL (ref 0.0–0.7)
Eosinophils Relative: 1 % (ref 0–5)
Lymphocytes Relative: 9 % — ABNORMAL LOW (ref 12–46)
Lymphs Abs: 1.1 10*3/uL (ref 0.7–4.0)
Monocytes Absolute: 0.9 10*3/uL (ref 0.1–1.0)
Monocytes Relative: 7 % (ref 3–12)
Neutro Abs: 10.9 10*3/uL — ABNORMAL HIGH (ref 1.7–7.7)
Neutrophils Relative %: 83 % — ABNORMAL HIGH (ref 43–77)

## 2011-01-10 LAB — GLUCOSE, CAPILLARY: Glucose-Capillary: 146 mg/dL — ABNORMAL HIGH (ref 70–99)

## 2011-01-10 LAB — TYPE AND SCREEN
ABO/RH(D): O POS
Antibody Screen: NEGATIVE

## 2011-01-10 LAB — MAGNESIUM
Magnesium: 1.7 mg/dL (ref 1.5–2.5)
Magnesium: 1.7 mg/dL (ref 1.5–2.5)
Magnesium: 1.8 mg/dL (ref 1.5–2.5)

## 2011-01-10 LAB — AMYLASE: Amylase: 72 U/L (ref 27–131)

## 2011-01-10 LAB — LIPASE, BLOOD: Lipase: 16 U/L (ref 11–59)

## 2011-01-10 LAB — ABO/RH: ABO/RH(D): O POS

## 2011-02-12 HISTORY — PX: ADRENALECTOMY: SHX876

## 2011-02-12 HISTORY — PX: CHOLECYSTECTOMY: SHX55

## 2011-02-15 NOTE — Discharge Summary (Signed)
NAMESEQUOIA, MINCEY NO.:  [id number]   MEDICAL RECORD NO.:  [id number]          PATIENT TYPE:  INP   LOCATION:  1432                         FACILITY:  [facility]   PHYSICIAN:  Thornton Park. Daphine Deutscher, MD  DATE OF BIRTH:  03-22-1935   DATE OF ADMISSION:  03/05/2008  DATE OF DISCHARGE:  03/09/2008                               DISCHARGE SUMMARY   ADMISSION DIAGNOSES:  1. Right upper quadrant abdominal pain.  2. Cholecystitis.   DISCHARGE DIAGNOSIS:  Severe gangrenous cholecystitis.   PROCEDURE:  March 05, 2008, laparoscopy with open cholecystectomy and  cholecystostomy placement.   COURSE IN THE HOSPITAL:  Mr. Isreal Moline was admitted by Dr.  Zachery Dakins.  I subsequently operated on him.  Because of how bad his  gallbladder was, I ended up resecting most of it and getting out his  stones and then putting in a cholecystostomy tube, which was a #20  Foley, and also a Blake drain.  He had had a previous __________  for  ulcer disease many years ago and presented with really late-stage  cholecystitis.  Despite that, he got along fairly well and was ready for  discharge on March 09, 2008.   CONDITION:  Markedly improved.      Thornton Park Daphine Deutscher, MD  Electronically Signed     MBM/MEDQ  D:  04/09/2008  T:  04/09/2008  Job:  [id number]

## 2011-02-15 NOTE — Discharge Summary (Signed)
NAMEWADDELL, ITEN NO.:  [id number]   MEDICAL RECORD NO.:  [id number]          PATIENT TYPE:  INP   LOCATION:  1429                         FACILITY:  [facility]   PHYSICIAN:  Heloise Purpura, MD      DATE OF BIRTH:  [birth year]   DATE OF ADMISSION:  03/16/2009  DATE OF DISCHARGE:  03/22/2009                               DISCHARGE SUMMARY   ADMISSION DIAGNOSIS:  Left adrenal mass.   DISCHARGE DIAGNOSES:  1. Pheochromocytoma  2. Atrial fibrillation.   HISTORY AND PHYSICAL:  For full details please see admission history and  physical.  Briefly, Mr. Veasey is a 76 year old gentleman who was  incidentally found to have a left adrenal mass that was concerning for  possible malignancy or pheochromocytoma or other biochemically active  adrenal lesion.  He underwent a full biochemical evaluation including  serum and 24-hour urine studies which were negative and did not  demonstrate evidence for biochemical activity including no evidence for  catecholamine excess.  After undergoing a metastatic evaluation and  discussing management options, he did wish to proceed with  adrenalectomy.   HOSPITAL COURSE:  On March 16, 2009, the patient was taken to the  operating room and underwent a left laparoscopic adrenalectomy.  He  remained stable, intraoperatively as well as postoperatively.  He was  maintained on a cardiac monitor over the first 24 hours after surgery.  He remained hemodynamically stable and his hemoglobin was 14.4 on  postoperative day #1.  On postoperative day #1, he did experience quite  a bit of nausea and was only able to tolerate a small amount of liquid.  Due to some persistent nausea, he did undergo evaluation with laboratory  studies on postoperative day #2, including serum amylase and lipase  studies, which were negative for pancreatitis.  His nausea subsequently  improved and appeared to be related to anesthesia and normal  postoperative bowel  immotility.  He also experienced some subjective  shortness of breath although no objective findings that were concerning.  A chest x-ray was performed which was unremarkable.  By the evening of  postoperative day #2, he was doing quite well and had been able to  tolerate a regular diet.  He was ambulating without difficulty.  He had  been able to be transitioned to oral pain medication.  He was monitored  that evening with plans to allow him to be discharged on postoperative  day #3.  On the morning of postoperative day #3, he was noted to be  mildly tachycardiac and on evaluation was noted to be in an irregular  heart rhythm.  An ECG was performed which demonstrated atrial  fibrillation of new onset.  He was placed on Diltiazem for heart rate  control and a cardiology consult was obtained.  Per cardiology, he was  managed medically with heart rate and blood pressure control consisting  of beta blockade, amiodarone, and calcium channel blockade.  He was able  to be transitioned to an oral medical regimen over the next couple of  days and was able to have his IV Diltiazem drip discontinued.  Unfortunately, he remained in atrial fibrillation.  It was felt  necessary for him to be anticoagulated.  Per cardiology, he was  anticoagulated with Coumadin.  On March 22, 2009, his INR was therapeutic  any was able to be discharged home in good condition.   DISPOSITION:  Home.   DISCHARGE MEDICATIONS:  He was in he was instructed to resume his  regular home medications as before surgery.  In addition, he was  discharged home with prescriptions to take Vicodin as needed for pain  and also given a prescription for Flomax (see addendum).  He was also  placed on oral metoprolol and Diltiazem per cardiology as well as  amiodarone.   DISCHARGE INSTRUCTIONS:  He was instructed to be ambulatory but  specifically told to refrain from any heavy lifting, strenuous activity,  or driving.  He was instructed  on signs and symptoms to be aware of that  would indicate further problems with atrial fibrillation and rapid  ventricular response.  He was instructed to resume his regular diet as  before surgery.   FOLLOW UP:  He is scheduled to follow up in the urology clinic in 1 week  for further postoperative evaluation.  He also has been arranged to have  followup per usual cardiology for monitoring of his INR and further  cardiac evaluation for management of his atrial fibrillation.   ADDENDUM:  He also did develop voiding symptoms on postoperative day #1  after his catheter was removed.  Postvoid residual urine were initially  checked and found to be approximately 400.  He was placed on Flomax and  by the following day his postvoid residuals had decreased under 200.  He  was maintained on alpha blockade with little Flomax throughout his  hospitalization.  In addition, his pathology did return during his  hospitalization and it was noted that he did have a pheochromocytoma.  It was felt that this was most likely biochemically inactive considering  his preoperative biochemical evaluation and the fact that he had  remained exceedingly stable intraoperatively.  This pathology report and  its implications were discussed with the patient during his  hospitalization.      Heloise Purpura, MD  Electronically Signed     LB/MEDQ  D:  03/22/2009  T:  03/22/2009  Job:  [id number]

## 2011-02-15 NOTE — H&P (Signed)
[name], [name] NO.:  [id number]   MEDICAL RECORD NO.:  [id number]          PATIENT TYPE:  INP   LOCATION:  1432                         FACILITY:  [facility]   PHYSICIAN:  Anselm Pancoast. Weatherly, M.D.DATE OF BIRTH:  07/15/1935   DATE OF ADMISSION:  03/05/2008  DATE OF DISCHARGE:                              HISTORY & PHYSICAL   CHIEF COMPLAINT:  Severe epigastric and right upper quadrant pain.   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  He says he has seen Dr. [name] for a routine  physical examination, and there were no abnormalities noted.  He is on  no chronic medications, with the exception of 2 eye drops for glaucoma.  The patient has recently had x-rays and MRI and ultrasound that was  prompted after he had back pain that he contributes to the onset of  helping his son move, and during this they did an MRI of the back and  noted that he had gallstones and then also some cysts in his kidneys,  and he had an ultrasound of the kidneys that showed cysts.  The patient  last evening had a regular supper, and then about 9:00 had the onset of  severe epigastric pain.  The pain became more intense, and he presented  to the [facility] out on [address] at approximately 3 a.m.  In the  ER, he was noted to be hypertensive and in significant pain.  He was  treated with Dilaudid and Phenergan.  They did a bedside ultrasound that  showed that he had gallstones.  Of course, he knows he has had  gallstones, as he has been imaged with ultrasounds and an MRI recently.  Then, I was called at about 6:00, and they said they were sending him to  [facility].  The patient arrived here about 6:45, and the nurses noted  that he was extremely hypertensive, about 220/120 range, and he has no  history of medications for hypertension.  I suggested that they  could  give him some pain medication and came in to see him promptly afterward,  and he is hypertensive.  His past history is significant in that he has  had a vagotomy and pyloroplasty for peptic ulcer disease and presented  with bleeding in the 1980s.  He says he has not had episodes similar to  this in pain, and it is thought that the symptoms that he is having now  are mostly a gallbladder attack.  I have talked with Dr. Lendell Caprice, who  has ordered some blood pressure medication, and the patient is n.p.o.  I  have also tentatively scheduled him for laparoscopic cholecystectomy and  cholangiogram today under Dr. Daphine Deutscher, and he tentatively will be on the  O.R. schedule for approximately 11 a.m.  The patient is allergic to  PENICILLIN and was given cefotetan I think 1 g prior to transfer to him,  so that was about 6:30 a.m.   PAST SURGICAL HISTORY:  He  has had a TURP about 3 or 4 years ago, and  said the he did not have significantly elevated blood pressure at that  time.   REVIEW OF SYSTEMS:  He does not smoke.  He does not use excessive  alcohol.  He denies any chest pain consistent with angina, and he has  not vomited with this episode of pain.  When he was in the Emergency  Room in [facility], his blood pressure when first recorded was 240/114,  pulse was 87, and respirations 16.  After he had been given Dilaudid and  transferred, his blood pressure was 193/99, with a pulse of 80 when he  left that facility.   LABORATORY STUDIES:  His white count was 12,600, and his hematocrit is  48.3, his hemoglobin is 17.  His electrolytes are normal at sodium 140,  potassium 4, chloride 108, CO2 22, glucose is 154, supposedly he is not  a diabetic, and his BUN is 18, creatinine is 1.3.  His liver function  studies are normal.  They did not send a copy of the ultrasound they did  at bedside with the patient.  I think he did have some cardiac enzymes  that appear to be normal.  There is no EKG,  and I obtained an EKG which  showed sinus rhythm and what could be atrial complexes.   PHYSICAL EXAMINATION:  GENERAL:  He is a moderately overweight Caucasian  male who does appear uncomfortable.  His wife is with him.  The patient  appears adequately hydrated.  VITAL SIGNS:  Blood pressures obtained here are in the approximately 210  range, with a diastolic of about 110, pulse is in the 80s-90s.  LUNGS:  Clear.  HEART:  There may be a little systolic murmur.  ABDOMEN:  He is tender in the upper right abdomen and also the  epigastric area.  There is a well-healed midline incision, and there is  no obvious incisional hernia.  He is not tender in the lower abdomen.  EXTREMITIES:  There is no pedal edema, and there are good peripheral  pulses.  CNS:  Appears physiologic.   ADMISSION IMPRESSION:  1. Probably acute cholecystitis with stones.  2. History in the past of peptic ulcer disease, with a history of      vagotomy and pyloroplasty.  3. Obvious hypertension, not controlled, and probably exacerbated by      pain.  4. History of glaucoma.  5. Moderate obesity.   PLAN:  Dr. Daphine Deutscher has been notified, and the patient has been added to  the O.R. schedule for about 11:00.  I am going to get an acute abdomen  and chest, and abdominal x-rays to make sure there is no free air or  nothing unusual.  I do not think we need to repeat the ultrasound, since  his stones have been demonstrated now three ways, MRIs and the  ultrasounds on two occasions.  The patient has had 1 g of cefotetan at  approximately 6:30 a.m.           ______________________________  Anselm Pancoast. Zachery Dakins, M.D.     WJW/MEDQ  D:  03/05/2008  T:  03/05/2008  Job:  [id number]

## 2011-02-15 NOTE — Op Note (Signed)
NAMEADRIENNE, DELAY NO.:  [id number]   MEDICAL RECORD NO.:  [id number]          PATIENT TYPE:  INP   LOCATION:  1432                         FACILITY:  [facility]   PHYSICIAN:  Thornton Park. Daphine Deutscher, MD  DATE OF BIRTH:  12/02/1934   DATE OF PROCEDURE:  03/05/2008  DATE OF DISCHARGE:  03/05/2008                               OPERATIVE REPORT   PREOPERATIVE DIAGNOSIS:  Acute cholecystitis.   POSTOPERATIVE DIAGNOSIS:  Acute and subacute chronic cholecystitis with  a gangrenous gallbladder.   PROCEDURE:  Laparoscopy, open cholecystectomy, cholecystostomy tube  placement with cholangiogram.   SURGEON:  Luretha Murphy, M.D.   ASSISTANT:  Anselm Pancoast. Zachery Dakins, M.D.   ANESTHESIA:  General endotracheal.   DESCRIPTION OF PROCEDURE:  Mr. Tiggs is a 76 year old man who  previously had undergone a vagotomy and pyloroplasty.  He was admitted  this morning by Dr. Zachery Dakins coming in from a [facility]  facility out on [address].  After prepping him with Techni-Care  and draping sterilely, made a longitudinal incision in his navel and  used an open technique to enter without difficulty.  I put my finger  inside and gently swept away and created a space, but he had lot of  adhesions from his midline incision from his laparotomy.  With the scope  in place, I found transverse colon was blocking off what appeared to be  gangrenous gallbladder, and what I did was I gently took that down with  scissors, and this fell away revealing green to black gallbladder.  It  was extremely tense because of her number of stones, and I thought about  doing opening it, but I held off.  Standard trocars were then placed,  two on the right lateral side and one in the upper midline, and I  dissected this down, but down near the neck, it really was stuck to the  duodenum, and because of the pyloroplasty, I wanted to prevent a  possible duodenal injury, not to mention common duct  injury.  I  therefore elected to open.  Once upon opening, I did a cholecystotomy  sucking out the gallbladder and removing as many little stones.  The  scrub nurse counted about 60 that I had removed at one point.  I then  attempted to go ahead and take out most if not all of the gallbladder  and carried this down to the infundibulum which was still somewhat thick-  walled, but at that point, it tended to be stuck to the duodenum.  With  my finger inside, I could see how it dove down and then went into this  chronic inflammatory mass.  I felt that the safest thing to do would be  to put a cholecystostomy tube at this point and not try to dissect free  the cystic duct.  A 20 Foley was brought in through a separate lateral  incision and blown up with about 5 mL of saline and then sutured in  place with a pursestring suture and with interrupted 2-0 Vicryls.  A 19  Blake drain was placed around that.  I did do a cholecystogram which  showed filling of the distal cystic duct, and there appeared to be a  stone impacted in that.  I went ahead and sucked out this and put it to  drain.  The 19 Blake drain was placed.  The wound was infiltrated with  lidocaine, it was irrigated out, closed in 2 layers with running #1 PDS,  and all  wounds were stable.  The patient tolerated the procedure well.  The  umbilical port was also repaired with a single 0 Vicryl pulling the  fascia together.  The patient tolerated the procedure well and was taken  to recovery room in satisfactory condition.      Thornton Park Daphine Deutscher, MD  Electronically Signed     MBM/MEDQ  D:  03/05/2008  T:  03/05/2008  Job:  [id number]

## 2011-02-15 NOTE — Op Note (Signed)
NAMEYOSEF, KROGH NO.:  [id number]   MEDICAL RECORD NO.:  [id number]          PATIENT TYPE:  INP   LOCATION:  0012                         [facility]AC[facility]   PHYSICIAN:  Heloise Purpura, MD      DATE OF BIRTH:  09/08/1935   DATE OF PROCEDURE:  03/16/2009  DATE OF DISCHARGE:                               OPERATIVE REPORT   PREOPERATIVE DIAGNOSIS:  Left adrenal mass.   POSTOPERATIVE DIAGNOSIS:  Left adrenal mass.   PROCEDURE:  Left laparoscopic adrenalectomy.   SURGEON:  Heloise Purpura, MD.   ASSISTANT:  Delia Chimes, NP.   ANESTHESIA:  General.   COMPLICATIONS:  None.   ESTIMATED BLOOD LOSS:  100 mL.   INTRAVENOUS FLUIDS:  2350 of lactated Ringer's.   SPECIMEN:  Left adrenal gland.   DISPOSITION:  Specimen to pathology.   INDICATION:  Mr. Moen is a 76 year old gentleman who was found to  have an incidentally detected left adrenal mass.  He underwent an  evaluation including MRI which demonstrated findings concerning for a  possible malignant neoplasm versus pheochromocytoma.  He underwent a  biochemical evaluation which did not demonstrate metabolic activity or  evidence of catecholamine excess.  After a discussion regarding  management options for treatment and a negative metastatic workup, he  elected to proceed with the above procedure.  The potential risks,  complications and alternative treatment options were discussed in detail  and informed consent was obtained.   DESCRIPTION OF PROCEDURE:  The patient was taken to the operating room  and a general anesthetic was administered.  He was given preoperative  antibiotics, placed in the left modified flank position, prepped and  draped in the usual sterile fashion.  Next, a preoperative time-out was  performed.  A site was selected just superior to the umbilicus in the  midline.  The patient had a prior upper midline incision.  This was  performed in an open Hasson fashion, allowing entry  through the rectus  fascia.  There were noted to be some adhesions below the rectus fascia  it was decided to proceed with placement of a port in the left lower  quadrant.  A 12 mm port was placed with the aid of the OptiView device  allowing entry under direct vision.  This allowed the pneumoperitoneum  to be developed and inspection of the abdomen revealed some adhesions  along the upper midline along with some omental adhesions overlying the  left kidney and colon.  This under laparoscopic vision, the previously  made incision into the upper midline was carried into the peritoneal  cavity away from the previously mentioned adhesions.  An additional 5 mm  port was then placed into the left upper quadrant under direct vision.  All ports were placed under direct vision and without difficulty.  With  the aid of the Harmonic scalpel, the white line of Toldt was incised  along the length of the descending colon allowing the colon to be  mobilized medially.  There were significant adhesions between the  omentum and the abdominal wall in the left upper quadrant which were  carefully taken down the Harmonic scalpel as well.  The patient was  noted to have a large cyst overlying the lateral aspect of the left  kidney.  This did provide some difficulty in the ability to retract the  kidney and therefore it was decided to drain this cyst which allowed for  easy retraction of the kidney laterally and inferiorly.  The space  between the anterior layer of Gerota's fascia and the mesocolon was then  developed and the renal vein was identified.  The patient was noted to  have multiple renal arteries on his on preoperative imaging.  He  appeared to have approximately three renal arteries on the left side and  a single renal vein.  The renal vein was identified and there was noted  to be a renal artery just superior to the renal vein.  This obscured the  adrenal vein which appeared to be extending  underneath the superior  renal artery.  The upper pole of the kidney was identified and dissected  away from the adrenal bed.  The adrenal vein was then identified  extending underneath the superior renal artery and was isolated with  right-angle dissection.  In order to properly and safely perform this  dissection an additional of 5 mm port was placed in the left lateral  abdominal wall with another 5 mm port placed in the left upper quadrant.  With adequate retraction the adrenal vein was able to be isolated with  right-angle dissection and divided between multiple Hem-o-lok clips. No  change in the patient's hemodynamic parameters were noted after ligation  of the adrenal vein.  The Harmonic scalpel and Hem-o-lok clips were then  used to ligate and divide the arterial blood supply to the adrenal gland  in the surrounding retroperitoneal fat.  There was noted to be an  extensive amount of perinephric fat which did make this dissection  somewhat difficult.  Once the adrenal mass and surrounding perinephric  fat was isolated and disarticulated it was placed into an Endopouch  retrieval bag for removal.  The adrenal bed was examined.  There  appeared to be excellent hemostasis even with a low pneumoperitoneum.  A  piece of Surgicel was then placed into the adrenal bed for extra  hemostatic purposes.  The 12 mm left lower quadrant port was then closed  at the fascial level with a zero Vicryl suture placed with the aid of  the suture passer device.  All remaining ports were removed under direct  vision and the adrenal mass was removed within the Endopouch retrieval  bag via the midline port site which was slightly extended inferiorly  away from the previously mentioned adhesions.  All ports were removed  under direct vision and the midline incision site was closed with a  running zero Vicryl suture.  The port sites were then injected with  0.25% Marcaine and reapproximated at the skin level  with 4-0 Monocryl  subcuticular sutures.  Dermabond was applied at the level of the skin.  He tolerated the procedure well and without complications.  He was able  to be extubated and transferred to the recovery unit in satisfactory  condition.      Heloise Purpura, MD  Electronically Signed     LB/MEDQ  D:  03/16/2009  T:  03/16/2009  Job:  [id number]

## 2011-02-15 NOTE — Consult Note (Signed)
NAMEMARQUIS, Thomas [name] NO.:  [id number]   MEDICAL RECORD NO.:  [id number]          PATIENT TYPE:  INP   LOCATION:  1429                         FACILITY:  [facility]   PHYSICIAN:  Lyn Records, M.D.   DATE OF BIRTH:  1935-05-04   DATE OF CONSULTATION:  03/19/2009  DATE OF DISCHARGE:                                 CONSULTATION   REASON FOR CONSULTATION:  Atrial fibrillation.   CONCLUSIONS:  1. Acute atrial fibrillation with rapid ventricular response likely      secondary to postop state and elevated blood pressure.  2. Left adrenalectomy, March 16, 2009.  3. Hypertension, untreated  4. History of glaucoma.  5. History of peptic ulcer disease.  6. Hyperlipidemia.   RECOMMENDATIONS:  1. Aggressive blood pressure control, to include a beta-blocker.  2. IV amiodarone bolus to convert AFib.  3. Consider echocardiography.  Before I order this study, I will check      our office records.  The patient states that he had an echo at      [facility] less than a year ago.  4. Replete potassium.  5. Check BNP.  6. Check serial cardiac markers.  7. IV heparin with subcu Lovenox if AFib persist for greater than 48      hours.  Anticoagulation would have to be approved by Dr. Laverle Patter      given the patient's postoperative state.   COMMENTS:  The patient is 76 years of age, was admitted to the hospital  on March 16, 2009, and underwent successful left adrenalectomy for an  adrenal mass.  He has recuperated quite well, but on March 19, 2009, a.m.  was noted to be in atrial fibrillation with rapid ventricular response.  The patient was relatively asymptomatic.  The patient did have a  pheochromocytoma by pathology report.  This likely had some influence on  the elevated blood pressures prior to adrenal resection.  He has been  asymptomatic while in atrial fibrillation is best I can tell.  He  currently denies chest pain, orthopnea, PND, syncope, and prior history  of heart  disease.  He was scheduled for discharge yesterday, but had a  fever.   OBJECTIVE:  VITAL SIGNS: Blood pressure is 138/90, heart rate is 130 on  IV Cardizem.  NECK: No JVD is noted.  No carotid bruits are heard.  LUNGS: Clear.  Breath sounds are diminished.  CARDIAC: Exam reveals a rapid irregular rhythm.  No murmurs or rub is  heard.  ABDOMEN: Soft.  EXTREMITIES: Reveal no edema.   EKG demonstrates atrial fibrillation with a rapid ventricular response  at 135 beats per minute without acute ST-T wave change.  Laboratory data  to this point is unremarkable with exception of a potassium of 3.2.  He  has already received supplemental potassium.  Chest x-ray on admission  was unremarkable.  EKG on admission was unremarkable.   DISCUSSION:  The patient has developed postoperative acute atrial  fibrillation.  Hopefully, this will convert relatively easily.  We will  give some intravenous amiodarone to attempt more rapid conversion.  He  needs  to have aggressive blood pressure control.  My assumption is that  the entire pheochromocytoma was resected.  Therefore, his current  pressures are probably not being driven by catecholamine excess.  It  will probably be safe to use a beta-blocker in this situation.  That  would certainly help to control heart rate response.  A combined alpha  and beta-blocker may be needed if we have difficulty with increasing  blood pressure upon initiation of a pure beta-blocker.  We will monitor.      Lyn Records, M.D.  Electronically Signed     HWS/MEDQ  D:  03/19/2009  T:  03/20/2009  Job:  [id number]   cc:   Heloise Purpura, MD  Fax: [phone]   Stacie Acres. Cliffton Asters, M.D.  Fax: [phone]

## 2011-02-15 NOTE — Consult Note (Signed)
NAMEBORNA, Thomas [name] NO.:  [id number]   MEDICAL RECORD NO.:  [id number]          [name] TYPE:  INP   LOCATION:  1432                         FACILITY:  [facility]   PHYSICIAN:  Corinna L. Lendell Caprice, MDDATE OF BIRTH:  Jun 25, 1935   DATE OF CONSULTATION:  03/05/2008  DATE OF DISCHARGE:                                 CONSULTATION   REQUESTING PHYSICIAN:  Anselm Pancoast. Zachery Dakins, M.D.   REASON FOR CONSULTATION:  Hypertensive urgency   IMPRESSION AND RECOMMENDATION:  1. Hypertensive urgency:  Thomas [name] reports high blood pressure      readings in Thomas past in Thomas office but does not carry a diagnosis      of hypertension.  Thomas [name] has never been on any antihypertensives or      counseled on a low-salt diet, etc. I suspect Thomas [name] has underlying      hypertension worsened by pain.  I will ask for progress notes from      Dr. Lucilla Lame office to confirm.  I recommend changing morphine to      Dilaudid to see whether this helps with this pain and elevated      blood pressure.  I will also give labetalol IV as needed as well as      hydralazine IV as needed.  Thomas [name] may need long-term antihypertensive.      Once his systolic blood pressure is less than __________ and      diastolic blood pressure less than 100, Thomas [name] is cleared for surgery      from my standpoint.  2. Glaucoma:  Continue outpatient medications.  3. History of peptic ulcer disease, status post vagotomy in 1984.  4. Recommend DVT and ulcer prophylaxis after surgery per general      surgery.   HISTORY OF PRESENT ILLNESS:  Thomas [name] is a pleasant 76 year old  white male who had a sudden onset of right upper quadrant pain last  night.  Thomas [name] was seen at Thomas free-standing [facility] emergency room and  directly admitted to Dr. Annette Stable service.  His blood pressure in Thomas  emergency room was 238/114 and has remained elevated here on Thomas floor.  Thomas [name] has received several small doses of morphine without much relief.  His pain  initially was 10/10 and remains about Thomas same.  Thomas [name] also had  vomiting.  Thomas [name] has had no diarrhea.  I was consulted for assistance with  blood pressure.  Thomas [name] denies any chest pain, shortness of breath.  Thomas [name] does complain of a headache, which she attributes to not eating.  Thomas [name]  denies any visual changes.   PAST MEDICAL HISTORY:  As above.   MEDICATIONS:  At home Thomas [name] takes Travatan 1 drop to both eyes daily,  Alphagan 1 drop to left eye twice a day.   SOCIAL HISTORY:  Thomas [name] does not drink, smoke or have a drug abuse  history.  Thomas [name] is here with his wife and other family members.   FAMILY HISTORY:  Negative for hypertension, heart disease, stroke.   PAST SURGICAL HISTORY:  As above.  Also bilateral knee surgeries.  REVIEW OF SYSTEMS:  As above, otherwise negative.   PHYSICAL EXAMINATION:  Temperature has not been recorded on Thomas floor  but in Thomas emergency room was 96.8 orally.  Blood pressure this morning  was 221/107 and after 10 mg of IV labetalol is 214/106, pulse 87,  respiratory rate 16, oxygen saturation 95% on room air.  GENERAL:  Thomas [name] is in a mild to moderate amount of distress due to  pain.  HEENT:  Normocephalic, atraumatic.  Pupils equal, round, reactive to  light.  Sclerae are nonicteric.  Slightly dry mucous membranes.  NECK:  Supple.  No thyromegaly.  No carotid bruits.  LUNGS:  Clear to auscultation bilaterally without wheezes, rhonchi or  rales.  CARDIOVASCULAR:  Regular rate and rhythm without murmurs, gallops or  rubs.  ABDOMEN:  Normal bowel sounds, soft.  Thomas [name] has right upper quadrant  tenderness and a positive Murphy sign.  GU:  Deferred.  RECTAL:  Deferred.  EXTREMITIES:  No clubbing, cyanosis or edema.  SKIN:  Thomas [name] has linear excoriations which are rather erythematous on both  forearms and his right leg without any vesicles.  NEUROLOGIC:  Thomas [name] is alert and oriented.  Cranial nerves and  sensorimotor exam are intact.  PSYCHIATRIC:   Normal affect.   LABS:  White blood cell count is 12,000 with 90% neutrophils, otherwise  unremarkable CBC.  Complete metabolic panel significant for a glucose of  154, SGOT of 40, total protein 9.0, otherwise unremarkable.  Lipase 81.  BNP 25.  Point care enzymes negative.  EKG shows normal sinus rhythm  with PACs.  Chest x-ray shows cardiomegaly without anything acute.  Two  views of Thomas abdomen shows nothing acute.  Two views of Thomas chest shows  nothing acute.   In addition to above, I will check a hemoglobin A1c to further evaluate  Thomas [name]'s hyperglycemia.  However, I suspect this is a stress  response rather than diabetes.   Thank you Dr. Zachery Dakins, for this consult, and I will continue to  follow.      Corinna L. Lendell Caprice, MD  Electronically Signed     CLS/MEDQ  D:  03/05/2008  T:  03/05/2008  Job:  [id number]   cc:   Stacie Acres. Cliffton Asters, M.D.  Fax: [phone]   [place] Daphine Deutscher, MD  [address]  [place]  [place] [address]

## 2011-02-21 ENCOUNTER — Ambulatory Visit (HOSPITAL_COMMUNITY)
Admission: RE | Admit: 2011-02-21 | Discharge: 2011-02-21 | Disposition: A | Discharge status: Home / Self Care | Payer: Medicare Other | Source: Ambulatory Visit | Attending: Hematology and Oncology | Admitting: Hematology and Oncology

## 2011-02-21 ENCOUNTER — Encounter (HOSPITAL_BASED_OUTPATIENT_CLINIC_OR_DEPARTMENT_OTHER): Payer: Medicare Other | Admitting: Hematology and Oncology

## 2011-02-21 ENCOUNTER — Other Ambulatory Visit: Payer: Self-pay | Admitting: Hematology and Oncology

## 2011-02-21 DIAGNOSIS — D472 Monoclonal gammopathy: Secondary | ICD-10-CM

## 2011-02-21 DIAGNOSIS — I129 Hypertensive chronic kidney disease with stage 1 through stage 4 chronic kidney disease, or unspecified chronic kidney disease: Secondary | ICD-10-CM

## 2011-02-21 DIAGNOSIS — C9000 Multiple myeloma not having achieved remission: Secondary | ICD-10-CM

## 2011-02-21 DIAGNOSIS — E8809 Other disorders of plasma-protein metabolism, not elsewhere classified: Secondary | ICD-10-CM

## 2011-02-21 DIAGNOSIS — N189 Chronic kidney disease, unspecified: Secondary | ICD-10-CM

## 2011-02-21 LAB — CBC WITH DIFFERENTIAL/PLATELET
BASO%: 0.4 % (ref 0.0–2.0)
Basophils Absolute: 0 10*3/uL (ref 0.0–0.1)
EOS%: 2.6 % (ref 0.0–7.0)
Eosinophils Absolute: 0.2 10*3/uL (ref 0.0–0.5)
HCT: 46.2 % (ref 38.4–49.9)
HGB: 15.7 g/dL (ref 13.0–17.1)
LYMPH%: 36 % (ref 14.0–49.0)
MCH: 32.3 pg (ref 27.2–33.4)
MCHC: 34 g/dL (ref 32.0–36.0)
MCV: 94.8 fL (ref 79.3–98.0)
MONO#: 0.7 10*3/uL (ref 0.1–0.9)
MONO%: 9 % (ref 0.0–14.0)
NEUT#: 4 10*3/uL (ref 1.5–6.5)
NEUT%: 52 % (ref 39.0–75.0)
Platelets: 196 10*3/uL (ref 140–400)
RBC: 4.88 10*6/uL (ref 4.20–5.82)
RDW: 14.3 % (ref 11.0–14.6)
WBC: 7.8 10*3/uL (ref 4.0–10.3)
lymph#: 2.8 10*3/uL (ref 0.9–3.3)

## 2011-02-23 LAB — SPEP & IFE WITH QIG
Albumin ELP: 56 % (ref 55.8–66.1)
Alpha-1-Globulin: 3.5 % (ref 2.9–4.9)
Alpha-2-Globulin: 7.9 % (ref 7.1–11.8)
Beta 2: 3.1 % — ABNORMAL LOW (ref 3.2–6.5)
Beta Globulin: 5.3 % (ref 4.7–7.2)
Gamma Globulin: 24.2 % — ABNORMAL HIGH (ref 11.1–18.8)
IgA: 58 mg/dL — ABNORMAL LOW (ref 68–378)
IgG (Immunoglobin G), Serum: 2050 mg/dL — ABNORMAL HIGH (ref 694–1618)
IgM, Serum: 24 mg/dL — ABNORMAL LOW (ref 60–263)
M-Spike, %: 1.51 g/dL
Total Protein, Serum Electrophoresis: 7.5 g/dL (ref 6.0–8.3)

## 2011-02-23 LAB — COMPREHENSIVE METABOLIC PANEL
ALT: 22 U/L (ref 0–53)
AST: 20 U/L (ref 0–37)
Albumin: 4.2 g/dL (ref 3.5–5.2)
Alkaline Phosphatase: 86 U/L (ref 39–117)
BUN: 19 mg/dL (ref 6–23)
CO2: 23 mEq/L (ref 19–32)
Calcium: 9.6 mg/dL (ref 8.4–10.5)
Chloride: 106 mEq/L (ref 96–112)
Creatinine, Ser: 1.61 mg/dL — ABNORMAL HIGH (ref 0.40–1.50)
Glucose, Bld: 86 mg/dL (ref 70–99)
Potassium: 4.5 mEq/L (ref 3.5–5.3)
Sodium: 137 mEq/L (ref 135–145)
Total Bilirubin: 0.6 mg/dL (ref 0.3–1.2)
Total Protein: 7.5 g/dL (ref 6.0–8.3)

## 2011-02-23 LAB — KAPPA/LAMBDA LIGHT CHAINS
Kappa free light chain: 72.2 mg/dL — ABNORMAL HIGH (ref 0.33–1.94)
Kappa:Lambda Ratio: 78.48 — ABNORMAL HIGH (ref 0.26–1.65)
Lambda Free Lght Chn: 0.92 mg/dL (ref 0.57–2.63)

## 2011-02-25 ENCOUNTER — Encounter (HOSPITAL_BASED_OUTPATIENT_CLINIC_OR_DEPARTMENT_OTHER): Payer: Medicare Other | Admitting: Hematology and Oncology

## 2011-02-25 DIAGNOSIS — C9000 Multiple myeloma not having achieved remission: Secondary | ICD-10-CM

## 2011-04-03 DIAGNOSIS — C61 Malignant neoplasm of prostate: Secondary | ICD-10-CM

## 2011-04-03 HISTORY — DX: Malignant neoplasm of prostate: C61

## 2011-04-03 HISTORY — PX: TRANSURETHRAL RESECTION OF PROSTATE: SHX73

## 2011-06-30 LAB — COMPREHENSIVE METABOLIC PANEL
ALT: 34
ALT: 38
ALT: 58 — ABNORMAL HIGH
AST: 29
AST: 40 — ABNORMAL HIGH
AST: 54 — ABNORMAL HIGH
Albumin: 2.8 — ABNORMAL LOW
Albumin: 3.1 — ABNORMAL LOW
Albumin: 4.3
Alkaline Phosphatase: 111
Alkaline Phosphatase: 58
Alkaline Phosphatase: 58
BUN: 12
BUN: 18
BUN: 20
CO2: 22
CO2: 25
CO2: 26
Calcium: 8.3 — ABNORMAL LOW
Calcium: 8.7
Calcium: 9.4
Chloride: 101
Chloride: 108
Chloride: 108
Creatinine, Ser: 1.3
Creatinine, Ser: 1.33
Creatinine, Ser: 1.5
GFR calc Af Amer: 56 — ABNORMAL LOW
GFR calc Af Amer: >60
GFR calc Af Amer: >60
GFR calc non Af Amer: 46 — ABNORMAL LOW
GFR calc non Af Amer: 53 — ABNORMAL LOW
GFR calc non Af Amer: 54 — ABNORMAL LOW
Glucose, Bld: 109 — ABNORMAL HIGH
Glucose, Bld: 154 — ABNORMAL HIGH
Glucose, Bld: 162 — ABNORMAL HIGH
Potassium: 3.8
Potassium: 4
Potassium: 4.6
Sodium: 133 — ABNORMAL LOW
Sodium: 139
Sodium: 140
Total Bilirubin: 0.7
Total Bilirubin: 1
Total Bilirubin: 1.5 — ABNORMAL HIGH
Total Protein: 5.8 — ABNORMAL LOW
Total Protein: 6.3
Total Protein: 9 — ABNORMAL HIGH

## 2011-06-30 LAB — DIFFERENTIAL
Basophils Absolute: 0
Basophils Absolute: 0
Basophils Relative: 0
Basophils Relative: 0
Eosinophils Absolute: 0
Eosinophils Absolute: 0
Eosinophils Relative: 0
Eosinophils Relative: 0
Lymphocytes Relative: 6 — ABNORMAL LOW
Lymphocytes Relative: 8 — ABNORMAL LOW
Lymphs Abs: 0.9
Lymphs Abs: 1
Monocytes Absolute: 0.3
Monocytes Absolute: 0.7
Monocytes Relative: 3
Monocytes Relative: 4
Neutro Abs: 11.3 — ABNORMAL HIGH
Neutro Abs: 15.3 — ABNORMAL HIGH
Neutrophils Relative %: 90 — ABNORMAL HIGH
Neutrophils Relative %: 90 — ABNORMAL HIGH

## 2011-06-30 LAB — HEMOGLOBIN A1C
Hgb A1c MFr Bld: 5.3
Mean Plasma Glucose: 111

## 2011-06-30 LAB — CBC
HCT: 40.1
HCT: 43.4
HCT: 48.3
Hemoglobin: 13.8
Hemoglobin: 14.6
Hemoglobin: 17
MCHC: 33.6
MCHC: 34.3
MCHC: 35.3
MCV: 89.7
MCV: 90.8
MCV: 92.2
Platelets: 183
Platelets: 207
Platelets: 215
RBC: 4.41
RBC: 4.7
RBC: 5.38
RDW: 13.4
RDW: 14.4
RDW: 14.4
WBC: 12.6 — ABNORMAL HIGH
WBC: 16.9 — ABNORMAL HIGH
WBC: 9.4

## 2011-06-30 LAB — POCT B-TYPE NATRIURETIC PEPTIDE (BNP): B Natriuretic Peptide, POC: 25.1

## 2011-06-30 LAB — POCT CARDIAC MARKERS
CKMB, poc: 2.2
Myoglobin, poc: 112
Operator id: 5507
Troponin i, poc: <0.05

## 2011-06-30 LAB — LIPASE, BLOOD: Lipase: 81

## 2011-08-30 ENCOUNTER — Other Ambulatory Visit: Payer: Self-pay | Admitting: Hematology and Oncology

## 2011-08-30 ENCOUNTER — Other Ambulatory Visit (HOSPITAL_BASED_OUTPATIENT_CLINIC_OR_DEPARTMENT_OTHER): Payer: Medicare Other | Admitting: Lab

## 2011-08-30 DIAGNOSIS — C9000 Multiple myeloma not having achieved remission: Secondary | ICD-10-CM

## 2011-08-30 LAB — CBC WITH DIFFERENTIAL/PLATELET
BASO%: 0.3 % (ref 0.0–2.0)
Basophils Absolute: 0 10*3/uL (ref 0.0–0.1)
EOS%: 3.6 % (ref 0.0–7.0)
Eosinophils Absolute: 0.2 10*3/uL (ref 0.0–0.5)
HCT: 46.5 % (ref 38.4–49.9)
HGB: 15.7 g/dL (ref 13.0–17.1)
LYMPH%: 36.6 % (ref 14.0–49.0)
MCH: 32.1 pg (ref 27.2–33.4)
MCHC: 33.7 g/dL (ref 32.0–36.0)
MCV: 95.1 fL (ref 79.3–98.0)
MONO#: 0.6 10*3/uL (ref 0.1–0.9)
MONO%: 8.8 % (ref 0.0–14.0)
NEUT#: 3.4 10*3/uL (ref 1.5–6.5)
NEUT%: 50.7 % (ref 39.0–75.0)
Platelets: 205 10*3/uL (ref 140–400)
RBC: 4.88 10*6/uL (ref 4.20–5.82)
RDW: 14.1 % (ref 11.0–14.6)
WBC: 6.7 10*3/uL (ref 4.0–10.3)
lymph#: 2.5 10*3/uL (ref 0.9–3.3)

## 2011-09-03 ENCOUNTER — Encounter: Payer: Self-pay | Admitting: *Deleted

## 2011-09-05 LAB — COMPREHENSIVE METABOLIC PANEL
ALT: 27 U/L (ref 0–53)
AST: 22 U/L (ref 0–37)
Albumin: 4.1 g/dL (ref 3.5–5.2)
Alkaline Phosphatase: 83 U/L (ref 39–117)
BUN: 22 mg/dL (ref 6–23)
CO2: 24 mEq/L (ref 19–32)
Calcium: 9.5 mg/dL (ref 8.4–10.5)
Chloride: 106 mEq/L (ref 96–112)
Creatinine, Ser: 1.79 mg/dL — ABNORMAL HIGH (ref 0.50–1.35)
Glucose, Bld: 106 mg/dL — ABNORMAL HIGH (ref 70–99)
Potassium: 4.2 mEq/L (ref 3.5–5.3)
Sodium: 139 mEq/L (ref 135–145)
Total Bilirubin: 0.7 mg/dL (ref 0.3–1.2)
Total Protein: 7.7 g/dL (ref 6.0–8.3)

## 2011-09-05 LAB — SPEP & IFE WITH QIG
Albumin ELP: 55.6 % — ABNORMAL LOW (ref 55.8–66.1)
Alpha-1-Globulin: 3.6 % (ref 2.9–4.9)
Alpha-2-Globulin: 8.5 % (ref 7.1–11.8)
Beta 2: 2.8 % — ABNORMAL LOW (ref 3.2–6.5)
Beta Globulin: 5.2 % (ref 4.7–7.2)
Gamma Globulin: 24.3 % — ABNORMAL HIGH (ref 11.1–18.8)
IgA: 60 mg/dL — ABNORMAL LOW (ref 68–379)
IgG (Immunoglobin G), Serum: 2530 mg/dL — ABNORMAL HIGH (ref 650–1600)
IgM, Serum: 20 mg/dL — ABNORMAL LOW (ref 41–251)
M-Spike, %: 1.54 g/dL
Total Protein, Serum Electrophoresis: 7.7 g/dL (ref 6.0–8.3)

## 2011-09-05 LAB — KAPPA/LAMBDA LIGHT CHAINS
Kappa free light chain: 83 mg/dL — ABNORMAL HIGH (ref 0.33–1.94)
Kappa:Lambda Ratio: 140.68 — ABNORMAL HIGH (ref 0.26–1.65)
Lambda Free Lght Chn: 0.59 mg/dL (ref 0.57–2.63)

## 2011-09-06 ENCOUNTER — Ambulatory Visit (HOSPITAL_BASED_OUTPATIENT_CLINIC_OR_DEPARTMENT_OTHER): Payer: Medicare Other | Admitting: Hematology and Oncology

## 2011-09-06 DIAGNOSIS — C9000 Multiple myeloma not having achieved remission: Secondary | ICD-10-CM

## 2011-09-06 DIAGNOSIS — Z23 Encounter for immunization: Secondary | ICD-10-CM

## 2011-09-06 DIAGNOSIS — I10 Essential (primary) hypertension: Secondary | ICD-10-CM

## 2011-09-06 DIAGNOSIS — N289 Disorder of kidney and ureter, unspecified: Secondary | ICD-10-CM

## 2011-09-06 HISTORY — DX: Disorder of kidney and ureter, unspecified: N28.9

## 2011-09-06 MED ORDER — INFLUENZA VIRUS VACC SPLIT PF IM SUSP
0.5000 mL | INTRAMUSCULAR | Status: AC | PRN
  Administered 2011-09-06: 0.5 mL via INTRAMUSCULAR

## 2011-09-06 NOTE — Progress Notes (Signed)
This office note has been dictated.

## 2011-09-06 NOTE — Progress Notes (Signed)
CC:   Lyn Records, M.D. Stacie Acres Cliffton Asters, M.D. Cecille Aver, M.D. Valetta Fuller, MD  IDENTIFYING STATEMENT:  The patient is a 76 year old man with multiple myeloma who presents for followup.  INTERIM HISTORY:  Mr. Spisak presents for followup.  He was last seen here 6 months ago.  He reports no pain.  His weight is stable.  LABORATORIES:  Lab results are as follows:  On 08/30/2011 hemoglobin and hematocrit 15.7 and 46.5 respectively, platelets 205; BUN and creatinine 22 and 1.75 respectively; M-spike 1.54 (1.51), IgG 2530 (2050), IgA 60 (58), IgM 20 (24).  MEDICATIONS:  Medications reviewed and updated.  ALLERGIES:  Ceftriaxone, penicillin.  PHYSICAL EXAMINATION:  General:  The patient is a well-appearing, well- nourished man in no distress.  Vitals:  Pulse 54, blood pressure 150/86, temperature 97.5, respirations 20, weight 213 pounds.  HEENT:  Head is atraumatic, normocephalic.  Sclerae anicteric.  Mouth moist.  Chest: Clear to percussion and auscultation.  CVS:  Unremarkable.  Abdomen: Soft, nontender.  Bowel sounds present.  Extremities:  No calf tenderness.  Pulses present and symmetrical.  LABORATORY DATA:  08/30/2011 white count 6.7; sodium 139, potassium 4.2, chloride 106, CO2 of 24, glucose 106, T-bili 0.7, alkaline phosphatase 83, AST 22, ALT 27, calcium 9.5.  Serum free light chains note a kappa/lambda ratio of 140.68 and a kappa free light chain of 83 (72).  IMPRESSION AND PLAN:  Mr. Steffenhagen is a 76 year old man with multiple myeloma, primary with light chain deposition disease based on the fact that bone marrow revealed 11% plasma cells.  His myeloma markers are slightly elevated, but overall better than they were a year ago.  Thus, we will continue surveillance for his smoldering disease and he has a followup visit in 6 months' time.  Besides blood work, we will obtain a skeletal survey.  He will receive a flu vaccine prior to discharge  from clinic.    ______________________________ Laurice Record, M.D. LIO/MEDQ  D:  09/06/2011  T:  09/06/2011  Job:  [id number]

## 2012-01-25 ENCOUNTER — Other Ambulatory Visit (HOSPITAL_BASED_OUTPATIENT_CLINIC_OR_DEPARTMENT_OTHER): Payer: Medicare Other | Admitting: Lab

## 2012-01-25 DIAGNOSIS — N289 Disorder of kidney and ureter, unspecified: Secondary | ICD-10-CM

## 2012-01-25 DIAGNOSIS — I10 Essential (primary) hypertension: Secondary | ICD-10-CM

## 2012-01-25 DIAGNOSIS — C9000 Multiple myeloma not having achieved remission: Secondary | ICD-10-CM

## 2012-01-25 LAB — CBC WITH DIFFERENTIAL/PLATELET
BASO%: 0.2 % (ref 0.0–2.0)
Basophils Absolute: 0 10*3/uL (ref 0.0–0.1)
EOS%: 3.3 % (ref 0.0–7.0)
Eosinophils Absolute: 0.2 10*3/uL (ref 0.0–0.5)
HCT: 44 % (ref 38.4–49.9)
HGB: 15.2 g/dL (ref 13.0–17.1)
LYMPH%: 39.9 % (ref 14.0–49.0)
MCH: 33 pg (ref 27.2–33.4)
MCHC: 34.6 g/dL (ref 32.0–36.0)
MCV: 95.4 fL (ref 79.3–98.0)
MONO#: 0.7 10*3/uL (ref 0.1–0.9)
MONO%: 10.6 % (ref 0.0–14.0)
NEUT#: 3 10*3/uL (ref 1.5–6.5)
NEUT%: 46 % (ref 39.0–75.0)
Platelets: 194 10*3/uL (ref 140–400)
RBC: 4.62 10*6/uL (ref 4.20–5.82)
RDW: 14.5 % (ref 11.0–14.6)
WBC: 6.5 10*3/uL (ref 4.0–10.3)
lymph#: 2.6 10*3/uL (ref 0.9–3.3)
nRBC: 0 % (ref 0–0)

## 2012-01-27 LAB — COMPREHENSIVE METABOLIC PANEL
ALT: 34 U/L (ref 0–53)
AST: 27 U/L (ref 0–37)
Albumin: 4.2 g/dL (ref 3.5–5.2)
Alkaline Phosphatase: 73 U/L (ref 39–117)
BUN: 17 mg/dL (ref 6–23)
CO2: 25 mEq/L (ref 19–32)
Calcium: 9.1 mg/dL (ref 8.4–10.5)
Chloride: 107 mEq/L (ref 96–112)
Creatinine, Ser: 1.66 mg/dL — ABNORMAL HIGH (ref 0.50–1.35)
Glucose, Bld: 90 mg/dL (ref 70–99)
Potassium: 4.2 mEq/L (ref 3.5–5.3)
Sodium: 138 mEq/L (ref 135–145)
Total Bilirubin: 0.5 mg/dL (ref 0.3–1.2)
Total Protein: 7.2 g/dL (ref 6.0–8.3)

## 2012-01-27 LAB — SPEP & IFE WITH QIG
Albumin ELP: 57.5 % (ref 55.8–66.1)
Alpha-1-Globulin: 3.6 % (ref 2.9–4.9)
Alpha-2-Globulin: 7.9 % (ref 7.1–11.8)
Beta 2: 2.6 % — ABNORMAL LOW (ref 3.2–6.5)
Beta Globulin: 4.9 % (ref 4.7–7.2)
Gamma Globulin: 23.5 % — ABNORMAL HIGH (ref 11.1–18.8)
IgA: 59 mg/dL — ABNORMAL LOW (ref 68–379)
IgG (Immunoglobin G), Serum: 2390 mg/dL — ABNORMAL HIGH (ref 650–1600)
IgM, Serum: 19 mg/dL — ABNORMAL LOW (ref 41–251)
M-Spike, %: 1.38 g/dL
Total Protein, Serum Electrophoresis: 7.2 g/dL (ref 6.0–8.3)

## 2012-01-27 LAB — KAPPA/LAMBDA LIGHT CHAINS
Kappa free light chain: 59.8 mg/dL — ABNORMAL HIGH (ref 0.33–1.94)
Kappa:Lambda Ratio: 87.94 — ABNORMAL HIGH (ref 0.26–1.65)
Lambda Free Lght Chn: 0.68 mg/dL (ref 0.57–2.63)

## 2012-02-01 ENCOUNTER — Encounter: Payer: Self-pay | Admitting: Hematology and Oncology

## 2012-02-01 ENCOUNTER — Telehealth: Payer: Self-pay | Admitting: Hematology and Oncology

## 2012-02-01 ENCOUNTER — Ambulatory Visit (HOSPITAL_BASED_OUTPATIENT_CLINIC_OR_DEPARTMENT_OTHER): Payer: Medicare Other | Admitting: Hematology and Oncology

## 2012-02-01 DIAGNOSIS — C9000 Multiple myeloma not having achieved remission: Secondary | ICD-10-CM

## 2012-02-01 NOTE — Patient Instructions (Signed)
Patient to follow up as instructed.   Current Outpatient Prescriptions  Medication Sig Dispense Refill  . brimonidine-timolol (COMBIGAN) 0.2-0.5 % ophthalmic solution Place 1 drop into both eyes every 12 (twelve) hours.        Marland Kitchen diltiazem (CARTIA XT) 240 MG 24 hr capsule Take 240 mg by mouth daily.        . fluticasone (FLONASE) 50 MCG/ACT nasal spray Place 2 sprays into the nose as needed. Take 2 sprays each nostril daily.      . metoprolol (LOPRESSOR) 50 MG tablet Take 50 mg by mouth 2 (two) times daily.        . Omega-3 Fatty Acids (FISH OIL) 300 MG CAPS Take 300 mg by mouth daily. Megared .       Marland Kitchen Travoprost, BAK Free, (TRAVATAN) 0.004 % SOLN ophthalmic solution Place 1 drop into both eyes daily.             May 2013  Sunday Monday Tuesday Wednesday Thursday Friday Saturday            1   EST PT 30   9:00 AM  (30 min.)  Laurice Record, MD  [facility] 2   3   4    5   6   7   8   9   10   11    12   13   14   15   16   17   18    19   20   21   22   23   24   25    26   27   28   29   30    31

## 2012-02-01 NOTE — Progress Notes (Signed)
This office note has been dictated.

## 2012-02-01 NOTE — Progress Notes (Signed)
CC:   Stacie Acres. Cliffton Asters, M.D. Cecille Aver, M.D. Valetta Fuller, MD  IDENTIFYING STATEMENT:  Patient is a 77 year old man with multiple myeloma who presents for followup.  INTERVAL HISTORY:  The patient was last seen 6 months ago.  Has no current issues or concerns.  Does not report pain.  Notes a stable weight.  LAB RESULTS:  On 01/25/2012 notes the following; hemoglobin and hematocrit 16.2 and 44 respectively, platelets 194; calcium 9.1; BUN and creatinine 17 and 1.66 (22-1.79); IgG 2390 (2530), IgA 59 (60), IgM 19 (20); SPEP demonstrated monoclonal IgG kappa protein with an M spike of 1.38 g per dL (1.5 g per dL).  MEDICATIONS:  Reviewed and updated.  PAST MEDICAL HISTORY/FAMILY HISTORY/SOCIAL HISTORY:  Unchanged.  REVIEW OF SYSTEMS:  Ten point review of systems negative.  PHYSICAL EXAM:  Patient is alert and oriented x3.  Vitals:  Pulse 47, blood pressure 150/67, temperature 97.1, respirations 20, weight 221.3 pounds.  HEENT:  Head is atraumatic, normocephalic.  Sclerae anicteric. Mouth moist.  Chest:  Clear.  Cardiovascular:  Unremarkable.  Abdomen: Soft, nontender.  Bowel sounds present.  Extremities:  No calf tenderness or edema.  Lymph nodes:  No adenopathy.  LAB DATA:  As above.  In addition, white cell count 6.5, sodium 139, potassium 4.2, chloride 106, CO2 24.  T bilirubin 0.7, alkaline phosphatase 83, AST 22, ALT 27, calcium 9.5.  IMPRESSION AND PLAN:  Mr. 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  His myeloma markers are a little better than last time, but in general stable. He will receive a skeletal survey.  He follows up in 6 months' time or sooner if needed.   ______________________________ Laurice Record, M.D. LIO/MEDQ  D:  02/01/2012  T:  02/01/2012  Job:  [id number]

## 2012-02-01 NOTE — Telephone Encounter (Signed)
appts made and printed for pt aom °

## 2012-02-06 ENCOUNTER — Ambulatory Visit (HOSPITAL_COMMUNITY)
Admission: RE | Admit: 2012-02-06 | Discharge: 2012-02-06 | Disposition: A | Discharge status: Home / Self Care | Payer: Medicare Other | Source: Ambulatory Visit | Attending: Hematology and Oncology | Admitting: Hematology and Oncology

## 2012-02-06 DIAGNOSIS — C9000 Multiple myeloma not having achieved remission: Secondary | ICD-10-CM | POA: Insufficient documentation

## 2012-02-06 DIAGNOSIS — I10 Essential (primary) hypertension: Secondary | ICD-10-CM

## 2012-02-06 DIAGNOSIS — N289 Disorder of kidney and ureter, unspecified: Secondary | ICD-10-CM

## 2012-02-09 ENCOUNTER — Telehealth: Payer: Self-pay | Admitting: Nurse Practitioner

## 2012-02-09 NOTE — Telephone Encounter (Signed)
Informed pt per Dr. Dalene Carrow- bone survey looked stable.  Pt verbalized appreciation.

## 2012-06-06 ENCOUNTER — Other Ambulatory Visit: Payer: Self-pay | Admitting: Gastroenterology

## 2012-07-04 ENCOUNTER — Other Ambulatory Visit (HOSPITAL_BASED_OUTPATIENT_CLINIC_OR_DEPARTMENT_OTHER): Payer: Medicare Other

## 2012-07-04 DIAGNOSIS — D472 Monoclonal gammopathy: Secondary | ICD-10-CM

## 2012-07-04 LAB — COMPREHENSIVE METABOLIC PANEL (CC13)
ALT: 33 U/L (ref 0–55)
AST: 28 U/L (ref 5–34)
Albumin: 3.7 g/dL (ref 3.5–5.0)
Alkaline Phosphatase: 86 U/L (ref 40–150)
BUN: 18 mg/dL (ref 7.0–26.0)
CO2: 22 mEq/L (ref 22–29)
Calcium: 9.3 mg/dL (ref 8.4–10.4)
Chloride: 108 mEq/L — ABNORMAL HIGH (ref 98–107)
Creatinine: 1.7 mg/dL — ABNORMAL HIGH (ref 0.7–1.3)
Glucose: 92 mg/dl (ref 70–99)
Potassium: 4 mEq/L (ref 3.5–5.1)
Sodium: 137 mEq/L (ref 136–145)
Total Bilirubin: 0.6 mg/dL (ref 0.20–1.20)
Total Protein: 7.5 g/dL (ref 6.4–8.3)

## 2012-07-04 LAB — CBC WITH DIFFERENTIAL/PLATELET
BASO%: 0.2 % (ref 0.0–2.0)
Basophils Absolute: 0 10*3/uL (ref 0.0–0.1)
EOS%: 3 % (ref 0.0–7.0)
Eosinophils Absolute: 0.2 10*3/uL (ref 0.0–0.5)
HCT: 44.7 % (ref 38.4–49.9)
HGB: 15.5 g/dL (ref 13.0–17.1)
LYMPH%: 42.4 % (ref 14.0–49.0)
MCH: 31.5 pg (ref 27.2–33.4)
MCHC: 34.7 g/dL (ref 32.0–36.0)
MCV: 90.9 fL (ref 79.3–98.0)
MONO#: 0.5 10*3/uL (ref 0.1–0.9)
MONO%: 7.3 % (ref 0.0–14.0)
NEUT#: 3 10*3/uL (ref 1.5–6.5)
NEUT%: 47.1 % (ref 39.0–75.0)
Platelets: 189 10*3/uL (ref 140–400)
RBC: 4.92 10*6/uL (ref 4.20–5.82)
RDW: 13.9 % (ref 11.0–14.6)
WBC: 6.4 10*3/uL (ref 4.0–10.3)
lymph#: 2.7 10*3/uL (ref 0.9–3.3)

## 2012-07-06 LAB — SPEP & IFE WITH QIG
Albumin ELP: 55.1 % — ABNORMAL LOW (ref 55.8–66.1)
Alpha-1-Globulin: 3.7 % (ref 2.9–4.9)
Alpha-2-Globulin: 8.2 % (ref 7.1–11.8)
Beta 2: 2.8 % — ABNORMAL LOW (ref 3.2–6.5)
Beta Globulin: 5.4 % (ref 4.7–7.2)
Gamma Globulin: 24.8 % — ABNORMAL HIGH (ref 11.1–18.8)
IgA: 61 mg/dL — ABNORMAL LOW (ref 68–379)
IgG (Immunoglobin G), Serum: 2050 mg/dL — ABNORMAL HIGH (ref 650–1600)
IgM, Serum: 18 mg/dL — ABNORMAL LOW (ref 41–251)
M-Spike, %: 1.55 g/dL
Total Protein, Serum Electrophoresis: 7.4 g/dL (ref 6.0–8.3)

## 2012-07-06 LAB — KAPPA/LAMBDA LIGHT CHAINS
Kappa free light chain: 75.2 mg/dL — ABNORMAL HIGH (ref 0.33–1.94)
Kappa:Lambda Ratio: 59.21 — ABNORMAL HIGH (ref 0.26–1.65)
Lambda Free Lght Chn: 1.27 mg/dL (ref 0.57–2.63)

## 2012-07-11 ENCOUNTER — Encounter: Payer: Self-pay | Admitting: Hematology and Oncology

## 2012-07-11 ENCOUNTER — Ambulatory Visit (HOSPITAL_BASED_OUTPATIENT_CLINIC_OR_DEPARTMENT_OTHER): Payer: Medicare Other | Admitting: Hematology and Oncology

## 2012-07-11 ENCOUNTER — Telehealth: Payer: Self-pay | Admitting: Hematology and Oncology

## 2012-07-11 VITALS — BP 155/75 | HR 48 | Temp 97.0°F | Resp 20 | Ht 66.0 in | Wt 209.0 lb

## 2012-07-11 DIAGNOSIS — C9000 Multiple myeloma not having achieved remission: Secondary | ICD-10-CM

## 2012-07-11 MED ORDER — INFLUENZA VIRUS VACC SPLIT PF IM SUSP
0.5000 mL | Freq: Once | INTRAMUSCULAR | Status: AC
  Administered 2012-07-11: 0.5 mL via INTRAMUSCULAR
  Filled 2012-07-11: qty 0.5

## 2012-07-11 NOTE — Progress Notes (Signed)
This office note has been dictated.

## 2012-07-11 NOTE — Telephone Encounter (Signed)
Printed and gv pt appt schedule for Jan and Feb 2014

## 2012-07-11 NOTE — Patient Instructions (Addendum)
Thomas [name]  [id number]   [facility] - AFTER VISIT SUMMARY   **RECOMMENDATIONS MADE BY THE CONSULTANT AND ANY TEST    RESULTS WILL BE SENT TO YOUR REFERRING DOCTORS.   YOUR EXAM FINDINGS, LABS AND RESULTS WERE DISCUSSED BY YOUR MD TODAY.  YOU CAN GO TO THE  WEB SITE FOR INSTRUCTIONS ON HOW TO ASSESS MY CHART FOR ADDITIONAL INFORMATION AS NEEDED.  Your Updated drug allergies are: Allergies as of 07/11/2012 - Review Complete 02/01/2012  Allergen Reaction Noted  . Ceftriaxone sodium Hives 09/03/2011  . Penicillins Hives 09/03/2011    Your current list of medications are: Current Outpatient Prescriptions  Medication Sig Dispense Refill  . brimonidine-timolol (COMBIGAN) 0.2-0.5 % ophthalmic solution Place 1 drop into both eyes every 12 (twelve) hours.        Marland Kitchen diltiazem (CARTIA XT) 240 MG 24 hr capsule Take 240 mg by mouth daily.        . fluticasone (FLONASE) 50 MCG/ACT nasal spray Place 2 sprays into the nose as needed. Take 2 sprays each nostril daily.      . metoprolol (LOPRESSOR) 50 MG tablet Take 50 mg by mouth 2 (two) times daily.        . Omega-3 Fatty Acids (FISH OIL) 300 MG CAPS Take 300 mg by mouth daily. Megared .       Marland Kitchen Travoprost, BAK Free, (TRAVATAN) 0.004 % SOLN ophthalmic solution Place 1 drop into both eyes daily.          INSTRUCTIONS GIVEN AND DISCUSSED:  See attached schedule   SPECIAL INSTRUCTIONS/FOLLOW-UP:  See above.  I acknowledge that I have been informed and understand all the instructions given to me and received a copy.I know to contact the clinic, my physician, or go to the emergency Department if any problems should occur.   I do not have any more questions at this time, but understand that I may call the [facility] at [phone] during business hours should I have any further questions or need assistance in obtaining follow-up care.

## 2012-07-11 NOTE — Progress Notes (Signed)
CC:   Thomas [name]. Thomas [name], M.D. Cecille Aver, M.D. Valetta Fuller, MD  IDENTIFYING STATEMENT:  The patient is a 77 year old man with a smoldering multiple myeloma who presents for followup.  INTERVAL HISTORY:  Thomas [name] was seen 4 months ago.  Has no back pain.  Has not lost weight.  Denies constipation.  Lab results are as follows:  On 07/04/2012, hemoglobin and hematocrit 15.4 and 44.7 respectively, platelets 189; calcium 9.3, BUN and creatinine 18 and 1.7 respectively; serum protein electrophoresis notes an M-spike of 1.55 (1.38), IgG of 2050 (2390), IgA of 61 (59), IgM of 18 (19). Kappa free light chain 75.20, lambda free light chain 1.27, kappa/lambda ratio 59.21 (87.94).  MEDICATIONS:  Reviewed and updated.  ALLERGIES:  Ceftriaxone, penicillin.  PAST MEDICAL HISTORY/FAMILY HISTORY/SOCIAL HISTORY:  Unchanged.  REVIEW OF SYSTEMS:  10-point review of systems is negative.  PHYSICAL EXAMINATION:  General:  The patient is a well-appearing, well- nourished man in no current distress.  Vitals:  Pulse 48, blood pressure 155/75, temperature 97, respirations 20, weight 209 pounds.  HEENT: Head is atraumatic, normocephalic.  Sclerae are anicteric.  Chest: Clear.  CVS:  Unremarkable.  Abdomen:  Soft, nontender.  Bowel sounds present.  Extremities:  No edema.  LABORATORY DATA:  As above.  In addition, white cell count 6.4.  Sodium 137, potassium 4, chloride 108, CO2 is 22, glucose 92.  T-bili 0.6, alkaline phosphatase 86, AST 28, ALT 33, total protein 7.5.  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  Kappa free light chain has increased from 4 months ago, but and he does not have evidence of  declining renal function.  He will continue observation and follows up in 4  months' time with blood  work.   ______________________________ Laurice Record, M.D. LIO/MEDQ  D:  07/11/2012  T:  07/11/2012  Job:  [id number]

## 2012-10-06 ENCOUNTER — Telehealth: Payer: Self-pay | Admitting: *Deleted

## 2012-10-06 NOTE — Telephone Encounter (Signed)
Per patient reassignment I have called patient to change providers. I have spoke with the patient and patient aware that Dr. Dalene Carrow is no longer with the practice. I have explained to the patient that Dr. Dalene Carrow recommends that Dr. Arline Asp takes over his care. Patient agrees with this decision and aware of new appts.

## 2012-10-08 ENCOUNTER — Encounter: Payer: Self-pay | Admitting: Oncology

## 2012-11-02 ENCOUNTER — Other Ambulatory Visit (HOSPITAL_BASED_OUTPATIENT_CLINIC_OR_DEPARTMENT_OTHER): Payer: Medicare Other | Admitting: Lab

## 2012-11-02 ENCOUNTER — Ambulatory Visit (HOSPITAL_COMMUNITY)
Admission: RE | Admit: 2012-11-02 | Discharge: 2012-11-02 | Disposition: A | Discharge status: Home / Self Care | Payer: Medicare Other | Source: Ambulatory Visit | Attending: Hematology and Oncology | Admitting: Hematology and Oncology

## 2012-11-02 DIAGNOSIS — C9000 Multiple myeloma not having achieved remission: Secondary | ICD-10-CM

## 2012-11-02 LAB — COMPREHENSIVE METABOLIC PANEL (CC13)
ALT: 40 U/L (ref 0–55)
AST: 32 U/L (ref 5–34)
Albumin: 3.6 g/dL (ref 3.5–5.0)
Alkaline Phosphatase: 89 U/L (ref 40–150)
BUN: 20 mg/dL (ref 7.0–26.0)
CO2: 25 mEq/L (ref 22–29)
Calcium: 9.5 mg/dL (ref 8.4–10.4)
Chloride: 107 mEq/L (ref 98–107)
Creatinine: 1.7 mg/dL — ABNORMAL HIGH (ref 0.7–1.3)
Glucose: 105 mg/dl — ABNORMAL HIGH (ref 70–99)
Potassium: 4.2 mEq/L (ref 3.5–5.1)
Sodium: 139 mEq/L (ref 136–145)
Total Bilirubin: 0.41 mg/dL (ref 0.20–1.20)
Total Protein: 7.9 g/dL (ref 6.4–8.3)

## 2012-11-02 LAB — CBC WITH DIFFERENTIAL/PLATELET
BASO%: 0.2 % (ref 0.0–2.0)
Basophils Absolute: 0 10*3/uL (ref 0.0–0.1)
EOS%: 4.7 % (ref 0.0–7.0)
Eosinophils Absolute: 0.3 10*3/uL (ref 0.0–0.5)
HCT: 46.6 % (ref 38.4–49.9)
HGB: 16 g/dL (ref 13.0–17.1)
LYMPH%: 37.8 % (ref 14.0–49.0)
MCH: 32.7 pg (ref 27.2–33.4)
MCHC: 34.3 g/dL (ref 32.0–36.0)
MCV: 95.2 fL (ref 79.3–98.0)
MONO#: 0.7 10*3/uL (ref 0.1–0.9)
MONO%: 9.6 % (ref 0.0–14.0)
NEUT#: 3.4 10*3/uL (ref 1.5–6.5)
NEUT%: 47.7 % (ref 39.0–75.0)
Platelets: 209 10*3/uL (ref 140–400)
RBC: 4.9 10*6/uL (ref 4.20–5.82)
RDW: 14.1 % (ref 11.0–14.6)
WBC: 7.1 10*3/uL (ref 4.0–10.3)
lymph#: 2.7 10*3/uL (ref 0.9–3.3)

## 2012-11-06 LAB — SPEP & IFE WITH QIG
Albumin ELP: 55.6 % — ABNORMAL LOW (ref 55.8–66.1)
Alpha-1-Globulin: 3.5 % (ref 2.9–4.9)
Alpha-2-Globulin: 8.1 % (ref 7.1–11.8)
Beta 2: 3 % — ABNORMAL LOW (ref 3.2–6.5)
Beta Globulin: 5.5 % (ref 4.7–7.2)
Gamma Globulin: 24.3 % — ABNORMAL HIGH (ref 11.1–18.8)
IgA: 76 mg/dL (ref 68–379)
IgG (Immunoglobin G), Serum: 2500 mg/dL — ABNORMAL HIGH (ref 650–1600)
IgM, Serum: 41 mg/dL (ref 41–251)
M-Spike, %: 1.62 g/dL
Total Protein, Serum Electrophoresis: 8 g/dL (ref 6.0–8.3)

## 2012-11-06 LAB — KAPPA/LAMBDA LIGHT CHAINS
Kappa free light chain: 60.8 mg/dL — ABNORMAL HIGH (ref 0.33–1.94)
Kappa:Lambda Ratio: 51.09 — ABNORMAL HIGH (ref 0.26–1.65)
Lambda Free Lght Chn: 1.19 mg/dL (ref 0.57–2.63)

## 2012-11-09 ENCOUNTER — Ambulatory Visit (HOSPITAL_BASED_OUTPATIENT_CLINIC_OR_DEPARTMENT_OTHER): Payer: Medicare Other | Admitting: Physician Assistant

## 2012-11-09 ENCOUNTER — Ambulatory Visit (HOSPITAL_BASED_OUTPATIENT_CLINIC_OR_DEPARTMENT_OTHER): Payer: Medicare Other | Admitting: Lab

## 2012-11-09 ENCOUNTER — Encounter: Payer: Self-pay | Admitting: Physician Assistant

## 2012-11-09 ENCOUNTER — Telehealth: Payer: Self-pay | Admitting: Oncology

## 2012-11-09 ENCOUNTER — Ambulatory Visit: Payer: Medicare Other | Admitting: Hematology and Oncology

## 2012-11-09 VITALS — BP 171/76 | HR 58 | Temp 97.7°F | Resp 20 | Ht 66.0 in | Wt 214.8 lb

## 2012-11-09 DIAGNOSIS — C9000 Multiple myeloma not having achieved remission: Secondary | ICD-10-CM

## 2012-11-09 LAB — LACTATE DEHYDROGENASE (CC13): LDH: 203 U/L (ref 125–245)

## 2012-11-09 NOTE — Progress Notes (Signed)
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 Kathrene Bongo for evaluation of worsening renal function, and M spike was noted to be  4.7 g/dL. A restricted band consistent with monoclonal protein was present.The monoclonal protein peak accounted for 1.54 g/dL of the YNWGN5.62 g/dL of protein in the gamma region.  Initial  IgG was 2550 on 01/06/2010.IgA was 72 and serum IgM was 31. Total protein was 7.5, kappa free light chains were 71.3, lambda free light chains 1.62,and kappa lambda ratio was 44.01. 24 hour urine protein performed on 01/12/2010 showed 40 mg of protein,free kappa light chains of 2.32, free lambda light chain to 0.13 and free kappa lambda ratio 17.85. Bone marrow biopsy on 01/21/2010 was remarkable for 11% plasma cells. Initial beta 2 microglobulin was 3.2,with last value on 11/02/2012 at 3.0. The patient remained on close monitoring,and as of 11/02/2012, his M spike was 1.62, with IgG of 2500, IgA 76 and IgM of 41.his kappa lambda ratio was 15.09, with a kappa free light chains of 60.08.last metastatic bone survey on 11/02/2012 did not show progression of the disease.  Marland Kitchen Hypertension   . Hypercholesterolemia   . Chronic kidney disease     Hx of bilateral renal cysts.  . History of atrial fibrillation 03/09/09  . Mild aortic stenosis   . Glaucoma   . BPH (benign prostatic hypertrophy)   . Smoldering multiple myeloma   . Prostate cancer 7/12    T1C Ad Ca  [name]/p TURP, observation only  . Hx of epiglottitis     2011     Procedure Date  . Adrenalectomy 02/12/11    Left, for Pheochromocytoma  . Cholecystectomy 02/12/11  . Gastrectomy 1984    [name]/P gastrectomy for ulcer  . Knee surgery 1971    [name]/P Right knee surgery  . Transurethral  resection of prostate 7/12   INTERVAL HISTORY: Thomas [name] returns today for followup evaluation. He was last seen at our office on 07/11/2012.it was noted that his kappa free light chain had increase, but without evidence of declining renal function. He remains on observation.the patient denies any fever, chills, night sweats, bone pain, arthralgia, or myalgias. He denies any appetite changes, or weight loss. He remains active. His last metastatic bone survey on 11/02/2012, did not show progression of the disease.   MEDICATIONS: Current Outpatient Prescriptions  Medication Sig Dispense Refill  . brimonidine-timolol (COMBIGAN) 0.2-0.5 % ophthalmic solution Place 1 drop into both eyes every 12 (twelve) hours.        Marland Kitchen diltiazem (CARTIA XT) 240 MG 24 hr capsule Take 240 mg by mouth daily.        . metoprolol (LOPRESSOR) 50 MG tablet Take 50 mg by mouth 2 (two) times daily.        . Omega-3 Fatty Acids (FISH OIL) 300 MG CAPS Take 300 mg by mouth daily. Megared .       Marland Kitchen Travoprost, BAK Free, (TRAVATAN) 0.004 % SOLN ophthalmic solution Place 1 drop into both eyes daily.         ALLERGIES:  is allergic to ceftriaxone sodium; penicillins; levaquin; and nsaids.  REVIEW OF SYSTEMS:  The rest of the 14-point review of system was negative.   There were no vitals filed for this visit. Wt Readings from Last 3  Encounters:  07/11/12 209 lb (94.802 kg)  02/01/12 221 lb 4.8 oz (100.381 kg)  09/06/11 213 lb 6.4 oz (96.798 kg)      PHYSICAL EXAMINATION:  General:  well-nourished in no acute distress.   HEENT:  Eyes:  no scleral icterus.No oropharyngeal lesions.  Neck was without   thyromegaly. No cervical, supraclavicular or axillary adenopathy. Respiratory: lungs were clear bilaterally without wheezing or crackles.  Cardiovascular:  Regular rate and rhythm, S1/S2, without murmur, rub or gallop.   GI:  abdomen was soft, flat, nontender, nondistended, without organomegaly.   Musculoskeletal:  no  spinal tenderness of palpation of vertebral spine.  Skin exam was without ecchymosis, petechiae.   Neuro exam :nonfocal.  Patient was able to get on and off exam table without assistance.  Gait was normal.  Patient was alerted and oriented.  Attention was good.   Language was appropriate.  Mood was normal without depression.  Speech was not pressured.  Thought content was not tangential.     LABORATORY/RADIOLOGY DATA:   Lab 11/02/12 1131  WBC 7.1  HGB 16.0  HCT 46.6  PLT 209  MCV 95.2  MCH 32.7  MCHC 34.3  RDW 14.1  LYMPHSABS 2.7  MONOABS 0.7  EOSABS 0.3  BASOSABS 0.0  BANDABS --    CMP    Lab 11/02/12 1131  NA 139  K 4.2  CL 107  CO2 25  GLUCOSE 105*  BUN 20.0  CREATININE 1.7*  CALCIUM 9.5  MG --  AST 32  ALT 40  ALKPHOS 89  BILITOT 0.41        Component Value Date/Time   BILITOT 0.41 11/02/2012 1131   BILITOT 0.5 01/25/2012 1159     Radiology Studies:  Dg Bone Survey Met  11/02/2012  *RADIOLOGY REPORT*  Clinical Data: Multiple myeloma.  Asymptomatic patient.  METASTATIC BONE SURVEY  Comparison: 02/06/2012.  Findings: Lucent area in the frontal region of the skull appears similar to the prior exam.  There are no new calvarial lesions identified.  No new compression fractures are identified. Osteoarthritis of the knee is present bilaterally with multiple loose bodies in the right knee.  Degenerative cyst in the right humeral head.  Right humeral shaft appears normal.  Subtle lucent area in the left humerus mid shaft appears identical to the prior exam allowing for projection.  Thoracolumbar spondylosis appears similar. Subtle chronic mid thoracic compression fracture is unchanged.  This appears to be a T8, better seen on the lumbar spine radiographs.  IMPRESSION: Stable examination without evidence of new osteolytic lesions. Stable faint areas of radiolucency in the frontal bones of the skull and mid shaft of the left humerus.   Original Report Authenticated By:  Andreas Newport, M.D.        ASSESSMENT AND PLAN: Dr. Arline Asp has seen and evaluated the patient and reviewed the chart. The patient has been seen for the first time, after the departure of Dr.Odogwu on 10/02/2012.  Overall, the patient remains clinically stable regarding his smoldering multiple myeloma. We will continue to check his labs on a regular basis. Today, we will add beta 2 microglobulin and 24-hour urine protein to the lab orders. Upon return in 4 months., We'll obtain a CBC with differential, symmetric, LDH, quantitative immunoglobulins for close monitoring of his disease.patient knows to call if he has any questions or concerns in the interim. Greater than 40 minutes have been spent with the patient reviewing his chart, and face-to-face.

## 2012-11-09 NOTE — Patient Instructions (Signed)
Return in 4 months for follow up with Dr. Arline Asp, with labs 24 hr urine protein to determine the protein in your urine

## 2012-11-09 NOTE — Telephone Encounter (Signed)
gv and printed appt schedule for pt for June.Marland KitchenMarland Kitchenpt wanted lab the week b4 visit...sent pt back to the lab

## 2012-11-12 LAB — BETA 2 MICROGLOBULIN, SERUM: Beta-2 Microglobulin: 2.58 mg/L — ABNORMAL HIGH (ref 1.01–1.73)

## 2012-11-13 LAB — PROTEIN, URINE, 24 HOUR
Collection Interval-UPROT: 24 hours
Protein, 24H Urine: 72 mg/d (ref 50–100)
Protein, Urine: 6 mg/dL
Urine Total Volume-UPROT: 1200 mL

## 2013-02-18 ENCOUNTER — Telehealth: Payer: Self-pay | Admitting: Oncology

## 2013-03-08 ENCOUNTER — Other Ambulatory Visit: Payer: Medicare Other | Admitting: Lab

## 2013-03-15 ENCOUNTER — Other Ambulatory Visit: Payer: Self-pay | Admitting: Family Medicine

## 2013-03-15 ENCOUNTER — Ambulatory Visit: Payer: Medicare Other | Admitting: Oncology

## 2013-03-15 ENCOUNTER — Ambulatory Visit
Admission: RE | Admit: 2013-03-15 | Discharge: 2013-03-15 | Disposition: A | Discharge status: Home / Self Care | Payer: Medicare Other | Source: Ambulatory Visit | Attending: Family Medicine | Admitting: Family Medicine

## 2013-03-21 ENCOUNTER — Other Ambulatory Visit (HOSPITAL_BASED_OUTPATIENT_CLINIC_OR_DEPARTMENT_OTHER): Payer: Medicare Other | Admitting: Lab

## 2013-03-21 DIAGNOSIS — C9000 Multiple myeloma not having achieved remission: Secondary | ICD-10-CM

## 2013-03-21 DIAGNOSIS — D472 Monoclonal gammopathy: Secondary | ICD-10-CM

## 2013-03-21 LAB — COMPREHENSIVE METABOLIC PANEL (CC13)
ALT: 40 U/L (ref 0–55)
AST: 30 U/L (ref 5–34)
Albumin: 3.5 g/dL (ref 3.5–5.0)
Alkaline Phosphatase: 91 U/L (ref 40–150)
BUN: 14.6 mg/dL (ref 7.0–26.0)
CO2: 21 mEq/L — ABNORMAL LOW (ref 22–29)
Calcium: 9.3 mg/dL (ref 8.4–10.4)
Chloride: 109 mEq/L — ABNORMAL HIGH (ref 98–107)
Creatinine: 1.8 mg/dL — ABNORMAL HIGH (ref 0.7–1.3)
Glucose: 142 mg/dl — ABNORMAL HIGH (ref 70–99)
Potassium: 4 mEq/L (ref 3.5–5.1)
Sodium: 137 mEq/L (ref 136–145)
Total Bilirubin: 0.5 mg/dL (ref 0.20–1.20)
Total Protein: 7.4 g/dL (ref 6.4–8.3)

## 2013-03-21 LAB — IGG, IGA, IGM
IgA: 51 mg/dL — ABNORMAL LOW (ref 68–379)
IgG (Immunoglobin G), Serum: 2020 mg/dL — ABNORMAL HIGH (ref 650–1600)
IgM, Serum: 16 mg/dL — ABNORMAL LOW (ref 41–251)

## 2013-03-21 LAB — CBC WITH DIFFERENTIAL/PLATELET
BASO%: 0.3 % (ref 0.0–2.0)
Basophils Absolute: 0 10*3/uL (ref 0.0–0.1)
EOS%: 3.2 % (ref 0.0–7.0)
Eosinophils Absolute: 0.2 10*3/uL (ref 0.0–0.5)
HCT: 44.2 % (ref 38.4–49.9)
HGB: 15.5 g/dL (ref 13.0–17.1)
LYMPH%: 38.9 % (ref 14.0–49.0)
MCH: 32.7 pg (ref 27.2–33.4)
MCHC: 35 g/dL (ref 32.0–36.0)
MCV: 93.4 fL (ref 79.3–98.0)
MONO#: 0.5 10*3/uL (ref 0.1–0.9)
MONO%: 7.4 % (ref 0.0–14.0)
NEUT#: 3.3 10*3/uL (ref 1.5–6.5)
NEUT%: 50.2 % (ref 39.0–75.0)
Platelets: 196 10*3/uL (ref 140–400)
RBC: 4.73 10*6/uL (ref 4.20–5.82)
RDW: 14.2 % (ref 11.0–14.6)
WBC: 6.5 10*3/uL (ref 4.0–10.3)
lymph#: 2.5 10*3/uL (ref 0.9–3.3)

## 2013-03-28 ENCOUNTER — Ambulatory Visit (HOSPITAL_BASED_OUTPATIENT_CLINIC_OR_DEPARTMENT_OTHER): Payer: Medicare Other | Admitting: Oncology

## 2013-03-28 ENCOUNTER — Encounter: Payer: Self-pay | Admitting: Oncology

## 2013-03-28 ENCOUNTER — Telehealth: Payer: Self-pay | Admitting: Oncology

## 2013-03-28 VITALS — BP 151/75 | HR 58 | Temp 97.4°F | Resp 18 | Ht 66.0 in | Wt 205.8 lb

## 2013-03-28 DIAGNOSIS — C9000 Multiple myeloma not having achieved remission: Secondary | ICD-10-CM

## 2013-03-28 NOTE — Telephone Encounter (Signed)
Gave pt appt for lab and MD on October 2014 °

## 2013-03-28 NOTE — Progress Notes (Signed)
This office note has been dictated.  #[id number]

## 2013-03-29 NOTE — Progress Notes (Signed)
CC:   Stacie Acres. Cliffton Asters, M.D. Cecille Aver, M.D. Valetta Fuller, MD  PROBLEM LIST: 1. Smoldering multiple myeloma, IgG kappa, with bone marrow aspirate     and biopsy carried out on 01/21/2010 showing 11% plasma cells.  On     03/21/2013, IgG level was 2020, IgA level 51, IgM 16.  On 1/31,     2014. Serum kappa light chains was 60.80.  The M protein on serum     protein electrophoresis, was 1.62 g/dL. Beta 2 microglobulin on     11/09/2012 was 2.58.  The patient has a normal albumin.  Metastatic     bone series, on 11/02/2012, showed no acute changes when compared     with prior x-rays from 02/06/2012.  There were faint areas of     radiolucency in the frontal bones of the skull and mid shaft of the     left humerus felt to be stable. 2. Renal insufficiency.  A left kidney biopsy, carried out on     02/18/2010, showed moderate to severe arterial nephrosclerosis with     moderate to severe tubulointerstitial scarring.  Immunofluorescence     microscopy showed monoclonal kappa chain staining along the tubular     basement membrane. 3. Hypertension. 4. History of recurrent atrial fibrillation. 5. Mild aortic stenosis. 6. Glaucoma. 7. Bilateral renal cysts. 8. 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 Barron Alvine. 9. History of hypercholesterolemia. 10. History of bleeding gastric ulcer in 1984, status post vagotomy and     pyloroplasty. 11. Status post right knee surgery in 1971. 12. Status post left adrenalectomy for a pheochromocytoma by Dr. Heloise Purpura on 02/02/2011.    MEDICATIONS:  Reviewed and recorded. Current Outpatient Prescriptions  Medication Sig Dispense Refill  . BOOSTRIX 5-2.5-18.5 injection       . brimonidine-timolol (COMBIGAN) 0.2-0.5 % ophthalmic solution Place 1 drop into both eyes every 12 (twelve) hours.        . clobetasol cream (TEMOVATE) 0.05 %       . colchicine 0.6 MG tablet  Take 0.6 mg by mouth daily.      Marland Kitchen diltiazem (CARTIA XT) 240 MG 24 hr capsule Take 240 mg by mouth daily.        Marland Kitchen diltiazem (DILACOR XR) 240 MG 24 hr capsule       . metoprolol (LOPRESSOR) 50 MG tablet Take 50 mg by mouth 2 (two) times daily.        . Omega-3 Fatty Acids (FISH OIL) 300 MG CAPS Take 300 mg by mouth daily. Megared .       Marland Kitchen Travoprost, BAK Free, (TRAVATAN) 0.004 % SOLN ophthalmic solution Place 1 drop into both eyes daily.        No current facility-administered medications for this visit.    IMMUNIZATIONS: 1. Pneumovax given in 2009. 2. Flu shot given every fall, most recently the fall of 2013.   SMOKING HISTORY:  The patient has never smoked cigarettes.   HISTORY:  Thomas [name] is 78 year old white married male with a diagnosis of smoldering multiple myeloma, IgG kappa on the basis of a bone marrow aspirate and biopsy carried out on 01/21/2010, which showed 11% plasma cells.  The patient was referred at that time by Dr. Carolynn Comment because of renal insufficiency and a monoclonal protein. Apparently, the initial IgG level was 2550, kappa free light chains were 71.3.  and a 24-hour urine collection on 01/12/2010 showed 40 mg of protein.  The patient has not shown any signs of progression.  He was last seen by [place] on 11/09/2012.  He recently has had some ankle pain felt to be gout.  The patient is on colchicine twice daily and has developed diarrhea.  He did have some x-rays of his right foot and ankle that failed to show any abnormalities other than some mild lateral ankle swelling.  The patient is without any other significant changes in his condition, feels generally well and is active.  He denies any musculoskeletal pains or any sense of ill health.  He is here today with his wife.   PHYSICAL EXAMINATION:  The patient looks well.  Weight is 205 pounds 12.8 ounces, height 5 feet 6 inches, body surface area 2.08 sq m.  Vital signs:  Blood pressure 151/75.   Other vital signs are normal.  Pulse is regular.  He is afebrile.  HEENT:  There is no scleral icterus.  He does wear glasses.  Mouth and pharynx benign.  No peripheral adenopathy palpable.  Lungs:  Clear to percussion and auscultation.  Cardiac: Regular rhythm with soft systolic ejection murmur.  I did not hear a diastolic murmur.  Abdomen:  Benign with no organomegaly or masses palpable.  Surgical scars are well healed.  Extremities:  1+ ankle edema.  The ankle is tender to palpation.  Neurologic exam::  Normal. The patient has had surgery on his right knee.  LABORATORY DATA:  From 03/21/2013:  White count 6.5, ANC 3.3, hemoglobin 15.5, hematocrit 44.2, platelets 196,000.  Chemistries from 03/21/2013, notable for a BUN of 15, creatinine 1.8, calcium 9.3, albumin 3.5, total protein 7.4, and thus globulins are 3.9.  LDH on 11/09/2012 was 203. Beta 2 microglobulin from 11/09/2012 was 2.58 which is slightly elevated but unchanged going back to 06/09/2010, when the beta 2 microglobulin was 2.85.  IgG level 2020, IgA 51, IgM 16 on 03/21/2013, again basically stable or in fact improved.  On 10/18/2010, the IgG level was 3080. Kappa free light chains on 11/02/2012, were 60.80 as compared with 88.50 on 10/18/2010.  On 11/02/2012, the M spike was 1.62, again stable.   IMAGING STUDIES:   1. Metastatic bone survey on 11/02/2012 showed stable exam compared with 02/06/2012.  No evidence of new osteolytic lesions. There are stable faint areas of radiolucency in the frontal bones of the skull and mid shaft of the left humerus.   IMPRESSION AND PLAN:  Mr. Noyce seems to have stable disease.  There has been no evidence of progression or any symptoms.  His renal function seems to be stable.  I should mention that a 24-hour urine collection carried out on 11/12/2012, yielded 72 mg of protein, which also is unchanged and certainly within normal limits.  I have asked the patient to return in 4 months,  at which time we will check CBC, chemistries, quantitative immunoglobulins, and serum light chains.  Previous records were reviewed in preparation for this appointment.  The patient's x-rays were reviewed with a radiologist who felt that the changes noted in the skull and left humerus were most likely benign.  ______________________________ Samul Dada, M.D. DSM/MEDQ  D:  03/28/2013  T:  03/29/2013  Job:  [id number]

## 2013-05-08 ENCOUNTER — Other Ambulatory Visit: Payer: Self-pay

## 2013-07-24 ENCOUNTER — Other Ambulatory Visit: Payer: Self-pay | Admitting: Medical Oncology

## 2013-07-24 ENCOUNTER — Other Ambulatory Visit (HOSPITAL_BASED_OUTPATIENT_CLINIC_OR_DEPARTMENT_OTHER): Payer: Medicare Other | Admitting: Lab

## 2013-07-24 DIAGNOSIS — C9000 Multiple myeloma not having achieved remission: Secondary | ICD-10-CM

## 2013-07-24 DIAGNOSIS — D472 Monoclonal gammopathy: Secondary | ICD-10-CM

## 2013-07-24 LAB — CBC WITH DIFFERENTIAL/PLATELET
BASO%: 0.2 % (ref 0.0–2.0)
Basophils Absolute: 0 10*3/uL (ref 0.0–0.1)
EOS%: 3.4 % (ref 0.0–7.0)
Eosinophils Absolute: 0.2 10*3/uL (ref 0.0–0.5)
HCT: 44.4 % (ref 38.4–49.9)
HGB: 15.2 g/dL (ref 13.0–17.1)
LYMPH%: 37.5 % (ref 14.0–49.0)
MCH: 32.4 pg (ref 27.2–33.4)
MCHC: 34.3 g/dL (ref 32.0–36.0)
MCV: 94.7 fL (ref 79.3–98.0)
MONO#: 0.6 10*3/uL (ref 0.1–0.9)
MONO%: 8.2 % (ref 0.0–14.0)
NEUT#: 3.5 10*3/uL (ref 1.5–6.5)
NEUT%: 50.7 % (ref 39.0–75.0)
Platelets: 194 10*3/uL (ref 140–400)
RBC: 4.69 10*6/uL (ref 4.20–5.82)
RDW: 14.1 % (ref 11.0–14.6)
WBC: 6.9 10*3/uL (ref 4.0–10.3)
lymph#: 2.6 10*3/uL (ref 0.9–3.3)

## 2013-07-24 LAB — COMPREHENSIVE METABOLIC PANEL (CC13)
ALT: 42 U/L (ref 0–55)
AST: 38 U/L — ABNORMAL HIGH (ref 5–34)
Albumin: 3.6 g/dL (ref 3.5–5.0)
Alkaline Phosphatase: 79 U/L (ref 40–150)
Anion Gap: 5 mEq/L (ref 3–11)
BUN: 17.2 mg/dL (ref 7.0–26.0)
CO2: 24 mEq/L (ref 22–29)
Calcium: 10.1 mg/dL (ref 8.4–10.4)
Chloride: 109 mEq/L (ref 98–109)
Creatinine: 1.6 mg/dL — ABNORMAL HIGH (ref 0.7–1.3)
Glucose: 115 mg/dl (ref 70–140)
Potassium: 4.3 mEq/L (ref 3.5–5.1)
Sodium: 138 mEq/L (ref 136–145)
Total Bilirubin: 0.47 mg/dL (ref 0.20–1.20)
Total Protein: 7.8 g/dL (ref 6.4–8.3)

## 2013-07-24 LAB — LACTATE DEHYDROGENASE (CC13): LDH: 214 U/L (ref 125–245)

## 2013-07-25 LAB — KAPPA/LAMBDA LIGHT CHAINS
Kappa free light chain: 53.2 mg/dL — ABNORMAL HIGH (ref 0.33–1.94)
Kappa:Lambda Ratio: 43.97 — ABNORMAL HIGH (ref 0.26–1.65)
Lambda Free Lght Chn: 1.21 mg/dL (ref 0.57–2.63)

## 2013-07-25 LAB — IGG, IGA, IGM
IgA: 48 mg/dL — ABNORMAL LOW (ref 68–379)
IgG (Immunoglobin G), Serum: 1990 mg/dL — ABNORMAL HIGH (ref 650–1600)
IgM, Serum: 16 mg/dL — ABNORMAL LOW (ref 41–251)

## 2013-07-29 ENCOUNTER — Other Ambulatory Visit: Payer: Self-pay | Admitting: Internal Medicine

## 2013-07-29 ENCOUNTER — Ambulatory Visit (HOSPITAL_BASED_OUTPATIENT_CLINIC_OR_DEPARTMENT_OTHER): Payer: Medicare Other | Admitting: Internal Medicine

## 2013-07-29 ENCOUNTER — Telehealth: Payer: Self-pay | Admitting: Internal Medicine

## 2013-07-29 VITALS — BP 146/63 | HR 52 | Temp 96.8°F | Resp 18 | Ht 66.0 in | Wt 208.4 lb

## 2013-07-29 DIAGNOSIS — C9000 Multiple myeloma not having achieved remission: Secondary | ICD-10-CM

## 2013-07-29 DIAGNOSIS — I10 Essential (primary) hypertension: Secondary | ICD-10-CM

## 2013-07-29 DIAGNOSIS — Z23 Encounter for immunization: Secondary | ICD-10-CM

## 2013-07-29 DIAGNOSIS — N289 Disorder of kidney and ureter, unspecified: Secondary | ICD-10-CM

## 2013-07-29 DIAGNOSIS — N189 Chronic kidney disease, unspecified: Secondary | ICD-10-CM

## 2013-07-29 MED ORDER — INFLUENZA VAC SPLIT QUAD 0.5 ML IM SUSP
0.5000 mL | INTRAMUSCULAR | Status: AC
  Administered 2013-07-29: 0.5 mL via INTRAMUSCULAR
  Filled 2013-07-29: qty 0.5

## 2013-07-29 NOTE — Patient Instructions (Signed)
(  Smothering )Multiple Myeloma Multiple myeloma is the most common cancer of bone. It is caused by the uncontrolled multiplication of a type of white blood cell in the marrow. This white blood cell is called a plasma cell. This means the bone marrow is overworking producing plasma cells. Soon these overproduced cells begin to take up room in the marrow that is needed by other cells. This means that there are soon not enough red or white blood cells or platelets. Not enough red cells mean that the person is anemic. There are not enough red blood cells to carry oxygen around the body. There are not enough white blood cells to fight disease. This causes the person with multiple myeloma to not feel well. There is also bone pain through much of the body. SYMPTOMS  Anemia causes fatigue (tiredness) and weakness.  Back pain is common. This is from fractures (break in bones) caused by damage to the bones of the back.  Lack of white blood cells makes infection more likely.  Bleeding is a common problem from lack of the cells (platelets). Platelets help blood clots form. This may show up as bleeding from any place. Commonly this shows up as bleeding from the nose or gums.  Fractures (bone breaks) are more common anywhere. The back and ribs are the most commonly fractured areas. DIAGNOSIS  This tumor is often suggested by blood tests. Often doing a bone marrow sample makes the diagnosis (learning what is wrong). This is a test performed by taking a small sample of bone with a small needle. This bone often comes from the sternum (breast bone). This sample is sent to a pathologist (a specialist in looking at tissue under a microscope). After looking at the sample under the microscope, the pathologist is able to make a diagnosis of the problem. X-rays may also show boney changes. TREATMENT   Occasionally, anti-cancer medications may be used with multiple myeloma. Your caregiver can discuss this with  you.  Medications can also be given to help with the bone pain.  There is no cure for multiple myeloma. Lifestyle changes can add years of quality living. HOME CARE INSTRUCTIONS  Often there is no specific treatment for multiple myeloma. Most of the treatment consists of adjustments in dietary and living activities. Some of these changes include:  Your dietitian or caregiver helping you with your dietary questions.  Taking iron and vitamins as prescribed by your caregiver.  Eating a well balanced diet.  Staying active, but follow restrictions suggested by your caregiver. Avoiding heavy lifting (more than 10 pounds) and activities that cause increased pain.  Drinking plenty of water.  Using back braces and a cane may help with some of the boney pain. SEEK IMMEDIATE MEDICAL CARE IF:  You develop severe, uncontrolled boney pain.  You or your family notices confusion, problems with decision-making or inability to stay awake.  You notice increased urination or constipation.  You notice problems holding your water or stool.  You have numbness or loss of control of your extremities (arms/hands or legs/feet). Document Released: 06/14/2001 Document Revised: 12/12/2011 Document Reviewed: 09/14/2008 [facility] Patient Information 2014 [place], [place].

## 2013-07-29 NOTE — Telephone Encounter (Signed)
, °

## 2013-07-29 NOTE — Progress Notes (Signed)
Thomas [name] OFFICE PROGRESS NOTE  Thomas Bradford, MD [phone] [address], [address] [place] [address]  DIAGNOSIS: Multiple myeloma - Plan: CBC with Differential, Kappa/lambda light chains, UIFE/TP, SPEP & IFE with QIG, Comprehensive metabolic panel, DG Bone Survey Met  Renal insufficiency - Plan: CBC with Differential, Kappa/lambda light chains, UIFE/TP, SPEP & IFE with QIG, Comprehensive metabolic panel, DG Bone Survey Met  Hypertension  Chief Complaint  Patient presents with  . Multiple Myeloma    CURRENT THERAPY: Observation.   INTERVAL HISTORY: [name] 78 y.o. male with a history of smoldering multiple myeloma, IgG kappa is here for follow-up.  She was last seen by Dr. Kimberlee Nearing on 03/28/2013.  He denies any infections, lytic lesions.  He denies any recent flares of gout.  Patient is on colchicine twice daily and had developed diarrhea.  He remains active and is accompanied today by his wife Corrie Dandy.  MEDICAL HISTORY: Past Medical History  Diagnosis Date  . Hypertension   . Hypercholesterolemia   . Chronic kidney disease     Hx of bilateral renal cysts.  . History of atrial fibrillation 03/09/09  . Mild aortic stenosis   . Glaucoma   . BPH (benign prostatic hypertrophy)   . Smoldering multiple myeloma   . Prostate cancer 7/12    T1C Ad Ca  s/p TURP, observation only  . Hx of epiglottitis     2011    INTERIM HISTORY: has Multiple myeloma; Renal insufficiency; and Hypertension on his problem list.    ALLERGIES:  is allergic to ceftriaxone sodium; penicillins; contrast media; levaquin; and nsaids.  MEDICATIONS: has a current medication list which includes the following prescription(s): boostrix, brimonidine-timolol, sm calcium 600+d plus minerals, clobetasol cream, diltiazem, metoprolol, fish oil, and travoprost (bak free).  SURGICAL HISTORY:  Past Surgical History  Procedure Laterality Date  . Adrenalectomy  02/12/11    Left, for  Pheochromocytoma  . Cholecystectomy  02/12/11  . Gastrectomy  1984    S/P gastrectomy for ulcer  . Knee surgery  1971    S/P Right knee surgery  . Transurethral resection of prostate  7/12   PROBLEM LIST:  1. Smoldering multiple myeloma, IgG kappa, with bone marrow aspirate and biopsy carried out on 01/21/2010 showing 11% plasma cells. On 03/21/2013, IgG level was 2020, IgA level 51, IgM 16. On 1/31, 2014. Serum kappa light chains was 60.80. The M protein on serum protein electrophoresis, was 1.62 g/dL. Beta 2 microglobulin on  11/09/2012 was 2.58. The patient has a normal albumin. Metastatic bone series, on 11/02/2012, showed no acute changes when compared with prior x-rays from 02/06/2012. There were faint areas of radiolucency in the frontal bones of the skull and mid shaft of the left humerus felt to be stable.  2. Renal insufficiency. A left kidney biopsy, carried out on  02/18/2010, showed moderate to severe arterial nephrosclerosis with moderate to severe tubulointerstitial scarring. Immunofluorescence microscopy showed monoclonal kappa chain staining along the tubular  basement membrane.  3. Hypertension.  4. History of recurrent atrial fibrillation.  5. Mild aortic stenosis.  6. Glaucoma.  7. Bilateral renal cysts.  8. 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 Barron Alvine.  9. History of hypercholesterolemia.  10. History of bleeding gastric ulcer in 1984, status post vagotomy and pyloroplasty.  11. Status post right knee surgery in 1971.  12. Status post left adrenalectomy for a pheochromocytoma by Dr. Heloise Purpura on  02/02/2011.  REVIEW OF SYSTEMS:   Constitutional: Denies fevers, chills or abnormal weight loss Eyes: Denies blurriness of vision Ears, nose, mouth, throat, and face: Denies mucositis or sore throat Respiratory: Denies cough, dyspnea or wheezes Cardiovascular: Denies palpitation, chest discomfort or lower  extremity swelling Gastrointestinal:  Denies nausea, heartburn or change in bowel habits Skin: Denies abnormal skin rashes Lymphatics: Denies new lymphadenopathy or easy bruising Neurological:Denies numbness, tingling or new weaknesses Behavioral/Psych: Mood is stable, no new changes  All other systems were reviewed with the patient and are negative.  PHYSICAL EXAMINATION: ECOG PERFORMANCE STATUS: 0 - Asymptomatic  Blood pressure 146/63, pulse 52, temperature 96.8 F (36 C), temperature source Oral, resp. rate 18, height 5\' 6"  (1.676 m), weight 208 lb 6.4 oz (94.53 kg).  GENERAL:alert, no distress and comfortable; elderly male well developed.  SKIN: skin color, texture, turgor are normal, no rashes or significant lesions EYES: normal, Conjunctiva are pink and non-injected, sclera clear OROPHARYNX:no exudate, no erythema and lips, buccal mucosa, and tongue normal  NECK: supple, thyroid normal size, non-tender, without nodularity LYMPH:  no palpable lymphadenopathy in the cervical, axillary or supraclavicular LUNGS: clear to auscultation and percussion with normal breathing effort HEART: regular rate & rhythm and soft SEM  and no lower extremity edema ABDOMEN:abdomen soft, non-tender and normal bowel sounds Musculoskeletal:no cyanosis of digits and no clubbing  NEURO: alert & oriented x 3 with fluent speech, no focal motor/sensory deficits  Labs:  Lab Results  Component Value Date   WBC 6.9 07/24/2013   HGB 15.2 07/24/2013   HCT 44.4 07/24/2013   MCV 94.7 07/24/2013   PLT 194 07/24/2013   NEUTROABS 3.5 07/24/2013      Chemistry      Component Value Date/Time   NA 138 07/24/2013 1517   NA 138 01/25/2012 1159   K 4.3 07/24/2013 1517   K 4.2 01/25/2012 1159   CL 109* 03/21/2013 0958   CL 107 01/25/2012 1159   CO2 24 07/24/2013 1517   CO2 25 01/25/2012 1159   BUN 17.2 07/24/2013 1517   BUN 17 01/25/2012 1159   CREATININE 1.6* 07/24/2013 1517   CREATININE 1.66* 01/25/2012 1159       Component Value Date/Time   CALCIUM 10.1 07/24/2013 1517   CALCIUM 9.1 01/25/2012 1159   ALKPHOS 79 07/24/2013 1517   ALKPHOS 73 01/25/2012 1159   AST 38* 07/24/2013 1517   AST 27 01/25/2012 1159   ALT 42 07/24/2013 1517   ALT 34 01/25/2012 1159   BILITOT 0.47 07/24/2013 1517   BILITOT 0.5 01/25/2012 1159       Urine Studies No results found for this basename: UACOL, UAPR, USPG, UPH, UTP, UGL, UKET, UBIL, UHGB, UNIT, UROB, ULEU, UEPI, UWBC, URBC, UBAC, CAST, CRYS, UCOM, BILUA,  in the last 72 hours  Basic Metabolic Panel:  Recent Labs Lab 07/24/13 1517  NA 138  K 4.3  CO2 24  GLUCOSE 115  BUN 17.2  CREATININE 1.6*  CALCIUM 10.1   GFR Estimated Creatinine Clearance: 42.3 ml/min (by C-G formula based on Cr of 1.6). Liver Function Tests:  Recent Labs Lab 07/24/13 1517  AST 38*  ALT 42  ALKPHOS 79  BILITOT 0.47  PROT 7.8  ALBUMIN 3.6   CBC:  Recent Labs Lab 07/24/13 1517  WBC 6.9  NEUTROABS 3.5  HGB 15.2  HCT 44.4  MCV 94.7  PLT 194   Studies:  No results found.   RADIOGRAPHIC STUDIES: 1. Metastatic bone survey on 11/02/2012 showed  stable exam compared with 02/06/2012. No evidence of new osteolytic lesions. There are stable faint areas of radiolucency in the frontal bones of the skull and mid shaft of the left humerus.   ASSESSMENT: Thomas [name] 78 y.o. male with a history of Multiple myeloma - Plan: CBC with Differential, Kappa/lambda light chains, UIFE/TP, SPEP & IFE with QIG, Comprehensive metabolic panel, DG Bone Survey Met  Renal insufficiency - Plan: CBC with Differential, Kappa/lambda light chains, UIFE/TP, SPEP & IFE with QIG, Comprehensive metabolic panel, DG Bone Survey Met  Hypertension  Based on NCCN guidelines defining end organ damage with creatinine greater than 2 or a hemoglobin less than 10, he would be considered as smothering Multiple myeloma.   PLAN:  1. Smothering IgA kappa multiple myeloma, with bone marrow aspirate and  biopsy carried out on 01/21/2010 showing 11% plasma cells. On 03/21/2013, IgG level was2020, IgA level 51, IgM 16. On 1/31, 2014. Serum kappa light chains was 60.80. The M protein on serum protein electrophoresis, was 1.62 g/dL. Beta 2 microglobulin on  11/09/2012 was 2.58. The patient has a normal albumin. Metastatic bone series, on 11/02/2012, showed no acute changes when compared with prior x-rays from 02/06/2012.   For now, we will continue surveillance every 3 months with labs including CBC, CMP, SPEP and kappa/lamba. We discussed that there is 10% chance per year for the first 5 years that his smothering MM would become active symptomatic multiple myeloma. Mr. Cerezo seems to have stable disease. There has been no evidence of progression or any symptoms. His renal function seems to be stable. The patient's x-rays were reviewed with a radiologist who felt that the changes noted in the skull and left humerus were most likely benign.  2. Chronic Kidney Disease. --His creatinine is slightly improved.    3. Follow-up. -- The patient will return in 4 months, at which time we will  check CBC, chemistries, quantitative immunoglobulins, and serum light chains. He will also require a metastatic bone survey.   All questions were answered. The patient knows to call the clinic with any problems, questions or concerns. We can certainly see the patient much sooner if necessary.  I spent 15 minutes counseling the patient face to face. The total time spent in the appointment was 25 minutes.    Legend Tumminello, MD 07/30/2013 8:53 PM

## 2013-08-12 ENCOUNTER — Encounter: Payer: Self-pay | Admitting: Interventional Cardiology

## 2013-10-29 ENCOUNTER — Encounter: Payer: Self-pay | Admitting: Interventional Cardiology

## 2013-10-29 DIAGNOSIS — I350 Nonrheumatic aortic (valve) stenosis: Secondary | ICD-10-CM | POA: Insufficient documentation

## 2013-10-29 DIAGNOSIS — E7800 Pure hypercholesterolemia, unspecified: Secondary | ICD-10-CM | POA: Insufficient documentation

## 2013-10-29 DIAGNOSIS — N400 Enlarged prostate without lower urinary tract symptoms: Secondary | ICD-10-CM | POA: Insufficient documentation

## 2013-10-29 DIAGNOSIS — N189 Chronic kidney disease, unspecified: Secondary | ICD-10-CM | POA: Insufficient documentation

## 2013-11-01 ENCOUNTER — Telehealth: Payer: Self-pay | Admitting: *Deleted

## 2013-11-01 NOTE — Telephone Encounter (Signed)
sw pt informed the the pt that his appt for 11/25/13 has changed. gv apppt for 11/25/13@ 2pm. Pt is aware...td

## 2013-11-06 ENCOUNTER — Ambulatory Visit: Payer: Medicare Other | Admitting: Interventional Cardiology

## 2013-11-07 ENCOUNTER — Encounter: Payer: Self-pay | Admitting: Interventional Cardiology

## 2013-11-07 ENCOUNTER — Telehealth: Payer: Self-pay

## 2013-11-07 ENCOUNTER — Ambulatory Visit (INDEPENDENT_AMBULATORY_CARE_PROVIDER_SITE_OTHER): Payer: Medicare Other | Admitting: Interventional Cardiology

## 2013-11-07 VITALS — BP 120/80 | HR 56 | Ht 66.0 in | Wt 210.0 lb

## 2013-11-07 DIAGNOSIS — K921 Melena: Secondary | ICD-10-CM | POA: Insufficient documentation

## 2013-11-07 DIAGNOSIS — I480 Paroxysmal atrial fibrillation: Secondary | ICD-10-CM

## 2013-11-07 DIAGNOSIS — I10 Essential (primary) hypertension: Secondary | ICD-10-CM

## 2013-11-07 DIAGNOSIS — I4891 Unspecified atrial fibrillation: Secondary | ICD-10-CM

## 2013-11-07 DIAGNOSIS — E7800 Pure hypercholesterolemia, unspecified: Secondary | ICD-10-CM

## 2013-11-07 DIAGNOSIS — Z79899 Other long term (current) drug therapy: Secondary | ICD-10-CM

## 2013-11-07 HISTORY — DX: Other long term (current) drug therapy: Z79.899

## 2013-11-07 HISTORY — DX: Paroxysmal atrial fibrillation: I48.0

## 2013-11-07 MED ORDER — AMIODARONE HCL 200 MG PO TABS: 200.0000 mg | ORAL_TABLET | Freq: Every day | ORAL | Status: DC

## 2013-11-07 NOTE — Telephone Encounter (Signed)
lmom for pt to return call.called pt to give Dr.Smith's instructions to start Amiodarone 200mg  daily and f/u with Dr.Dmith in 4-6 wks

## 2013-11-07 NOTE — Patient Instructions (Addendum)
Start Aspirin 81mg  daily  We will call you about starting the mew medication Eliquis or Amiodarone  Your physician wants you to follow-up in: 1 year You will receive a reminder letter in the mail two months in advance. If you don't receive a letter, please call our office to schedule the follow-up appointment.

## 2013-11-07 NOTE — Progress Notes (Signed)
Patient ID: Thomas [name], male   DOB: 18-May-1935, 79 y.o.   MRN: [id number] Past Medical History  Peptic ulcer disease, recent melena, 03/2009, while on Coumadin.   Hypertension   Glaucoma   BPH   Mild aortic stenosis   renal insufficiency--Dr Louie Boston, s/p bx 5/11, stage 3 CKD   bilateral kidney cysts--Dr. Risa Grill   Adenomatous polyps   L adrenal tumor on CT 4/10, pheochromocytoma   Paroxysmal atrial fibrillation, reverted on amiodarone therapy.--Dr Pernell Dupre   obsructive sleep apnea--Dr Radford Pax   possible multiple myeloma--Dr Chism   Prostate CA--Dr. Risa Grill   R knee OA   L knee torn meniscus, Dr Tonita Cong   Elevated uric acid, possible gout   Stress fx of ankle 2014   Osteopenia      [address] [place],   [address] Phone: [phone] Fax:  [phone]  Date:  11/07/2013   ID:  Thomas [name], DOB 12-09-1934, MRN [id number]  PCP:  [name], MD   ASSESSMENT:  1. Paroxysmal atrial fibrillation, continues 2. History of peptic ulcer disease with melena on Coumadin 3. Possible coronary artery disease with inferior Q waves noted on EKG, old and unchanged 4. Hypertension  PLAN:  1. Coated aspirin 81 mg daily. 2. Amiodarone 200 mg daily to suppress recurring episodes of atrial fibrillation. Return in 6 weeks with EKG to have further discussion concerning management  3. Continue antihypertensive therapy   SUBJECTIVE: Thomas [name] is a 79 y.o. male has had at least 5 episodes of prolonged atrial fibrillation over the past year since seeing me. Long discussion concerning the inadequacy of by her current medical regimen. He is on no antiplatelet or antithrombotic therapy. He had melena presumed related. GI workup for melena in 2010 revealed no significant abnormality. Coumadin was discontinued. We eventually tried Pradaxa, but the patient could not afford it. He is subsequently also had a colonoscopy which revealed a  hyperplastic polyp. No recurrence of melena. His remote history of surgery for peptic ulcer disease in 1985.   Wt Readings from Last 3 Encounters:  11/07/13 210 lb (95.255 kg)  07/29/13 208 lb 6.4 oz (94.53 kg)  03/28/13 205 lb 12.8 oz (93.35 kg)     Past Medical History  Diagnosis Date  . Hypertension   . Hypercholesterolemia   . Chronic kidney disease     Hx of bilateral renal cysts.  . History of atrial fibrillation 03/09/09  . Mild aortic stenosis   . Glaucoma   . BPH (benign prostatic hypertrophy)   . Smoldering multiple myeloma   . Prostate cancer 7/12    T1C Ad Ca  s/p TURP, observation only  . Hx of epiglottitis     2011    Current Outpatient Prescriptions  Medication Sig Dispense Refill  . BOOSTRIX 5-2.5-18.5 injection       . brimonidine-timolol (COMBIGAN) 0.2-0.5 % ophthalmic solution Place 1 drop into both eyes every 12 (twelve) hours.        . Calcium Carbonate-Vit D-Min (SM CALCIUM 600+D PLUS MINERALS) 600-800 MG-UNIT CHEW Chew 1 tablet by mouth daily.      . clobetasol cream (TEMOVATE) 0.05 %       . diltiazem (CARTIA XT) 240 MG 24 hr capsule Take 240 mg by mouth daily.        Marland Kitchen KRILL OIL PO Take by mouth daily.      . metoprolol (LOPRESSOR) 50 MG tablet Take 50 mg by mouth 2 (two) times  daily.        . Travoprost, BAK Free, (TRAVATAN) 0.004 % SOLN ophthalmic solution Place 1 drop into both eyes daily.        No current facility-administered medications for this visit.    Allergies:    Allergies  Allergen Reactions  . Ceftriaxone Sodium Hives  . Penicillins Hives  . Contrast Media [Iodinated Diagnostic Agents]     Poor kidney function  . Levaquin [Levofloxacin]   . Nsaids     Social History:  The patient  reports that he has never smoked. He does not have any smokeless tobacco history on file.   ROS:  Please see the history of present illness.      All other systems reviewed and negative.   OBJECTIVE: VS:  BP 120/80  Pulse 56  Ht 5' 6"  (1.676 m)   Wt 210 lb (95.255 kg)  BMI 33.91 kg/m2 Well nourished, well developed, in no acute distress, obese HEENT: normal Neck: JVD flat. Carotid bruit absent  Cardiac:  normal S1, S2; RRR; no murmur Lungs:  clear to auscultation bilaterally, no wheezing, rhonchi or rales Abd: soft, nontender, no hepatomegaly Ext: Edema absent2. Pulses 2+ and symmetric Skin: warm and dry Neuro:  CNs 2-12 intact, no focal abnormalities noted  EKG:  Normal sinus rhythm with small inferior Q waves.       Signed, Illene Labrador III, MD 11/07/2013 1:44 PM

## 2013-11-08 NOTE — Telephone Encounter (Signed)
returned pt call.pt given Dr.Smith's instructions start Amiodarone 200mg  daily, an Rx has been sent to pt pharmacy.pt has a f/u appt with an EKG on 12/05/13 @ 4pm.pt to call the office if he is experiencing symptoms.pt agreeable with plan and verbalized understanding

## 2013-11-08 NOTE — Telephone Encounter (Signed)
Follow up    Pt is returning Lisa's call

## 2013-11-18 ENCOUNTER — Other Ambulatory Visit (HOSPITAL_BASED_OUTPATIENT_CLINIC_OR_DEPARTMENT_OTHER): Payer: Medicare Other

## 2013-11-18 DIAGNOSIS — C9000 Multiple myeloma not having achieved remission: Secondary | ICD-10-CM

## 2013-11-18 DIAGNOSIS — N289 Disorder of kidney and ureter, unspecified: Secondary | ICD-10-CM

## 2013-11-18 LAB — CBC WITH DIFFERENTIAL/PLATELET
BASO%: 0.3 % (ref 0.0–2.0)
Basophils Absolute: 0 10*3/uL (ref 0.0–0.1)
EOS%: 3.6 % (ref 0.0–7.0)
Eosinophils Absolute: 0.3 10*3/uL (ref 0.0–0.5)
HCT: 46.9 % (ref 38.4–49.9)
HGB: 15.9 g/dL (ref 13.0–17.1)
LYMPH%: 41.3 % (ref 14.0–49.0)
MCH: 32.3 pg (ref 27.2–33.4)
MCHC: 33.8 g/dL (ref 32.0–36.0)
MCV: 95.4 fL (ref 79.3–98.0)
MONO#: 0.7 10*3/uL (ref 0.1–0.9)
MONO%: 9.2 % (ref 0.0–14.0)
NEUT#: 3.3 10*3/uL (ref 1.5–6.5)
NEUT%: 45.6 % (ref 39.0–75.0)
Platelets: 192 10*3/uL (ref 140–400)
RBC: 4.92 10*6/uL (ref 4.20–5.82)
RDW: 13.7 % (ref 11.0–14.6)
WBC: 7.2 10*3/uL (ref 4.0–10.3)
lymph#: 3 10*3/uL (ref 0.9–3.3)

## 2013-11-18 LAB — COMPREHENSIVE METABOLIC PANEL (CC13)
ALT: 34 U/L (ref 0–55)
AST: 30 U/L (ref 5–34)
Albumin: 3.9 g/dL (ref 3.5–5.0)
Alkaline Phosphatase: 75 U/L (ref 40–150)
Anion Gap: 5 meq/L (ref 3–11)
BUN: 20.2 mg/dL (ref 7.0–26.0)
CO2: 26 meq/L (ref 22–29)
Calcium: 9.9 mg/dL (ref 8.4–10.4)
Chloride: 107 meq/L (ref 98–109)
Creatinine: 1.8 mg/dL — ABNORMAL HIGH (ref 0.7–1.3)
Glucose: 132 mg/dL (ref 70–140)
Potassium: 4.1 meq/L (ref 3.5–5.1)
Sodium: 138 meq/L (ref 136–145)
Total Bilirubin: 0.74 mg/dL (ref 0.20–1.20)
Total Protein: 7.7 g/dL (ref 6.4–8.3)

## 2013-11-20 LAB — SPEP & IFE WITH QIG
Albumin ELP: 56 % (ref 55.8–66.1)
Alpha-1-Globulin: 5.7 % — ABNORMAL HIGH (ref 2.9–4.9)
Alpha-2-Globulin: 7.8 % (ref 7.1–11.8)
Beta 2: 2 % — ABNORMAL LOW (ref 3.2–6.5)
Beta Globulin: 5.7 % (ref 4.7–7.2)
Gamma Globulin: 22.8 % — ABNORMAL HIGH (ref 11.1–18.8)
IgA: 50 mg/dL — ABNORMAL LOW (ref 68–379)
IgG (Immunoglobin G), Serum: 1900 mg/dL — ABNORMAL HIGH (ref 650–1600)
IgM, Serum: 18 mg/dL — ABNORMAL LOW (ref 41–251)
M-Spike, %: 1.45 g/dL
Total Protein, Serum Electrophoresis: 7.7 g/dL (ref 6.0–8.3)

## 2013-11-20 LAB — KAPPA/LAMBDA LIGHT CHAINS
Kappa free light chain: 62.5 mg/dL — ABNORMAL HIGH (ref 0.33–1.94)
Kappa:Lambda Ratio: 46.3 — ABNORMAL HIGH (ref 0.26–1.65)
Lambda Free Lght Chn: 1.35 mg/dL (ref 0.57–2.63)

## 2013-11-25 ENCOUNTER — Ambulatory Visit (HOSPITAL_COMMUNITY)
Admission: RE | Admit: 2013-11-25 | Discharge: 2013-11-25 | Disposition: A | Discharge status: Home / Self Care | Payer: Medicare Other | Source: Ambulatory Visit | Attending: Internal Medicine | Admitting: Internal Medicine

## 2013-11-25 ENCOUNTER — Telehealth: Payer: Self-pay | Admitting: Internal Medicine

## 2013-11-25 ENCOUNTER — Encounter: Payer: Self-pay | Admitting: Internal Medicine

## 2013-11-25 ENCOUNTER — Ambulatory Visit (HOSPITAL_BASED_OUTPATIENT_CLINIC_OR_DEPARTMENT_OTHER): Payer: Medicare Other | Admitting: Internal Medicine

## 2013-11-25 VITALS — BP 144/72 | HR 55 | Temp 98.0°F | Resp 20 | Ht 66.0 in | Wt 212.7 lb

## 2013-11-25 DIAGNOSIS — M2340 Loose body in knee, unspecified knee: Secondary | ICD-10-CM | POA: Insufficient documentation

## 2013-11-25 DIAGNOSIS — C9000 Multiple myeloma not having achieved remission: Secondary | ICD-10-CM

## 2013-11-25 DIAGNOSIS — D472 Monoclonal gammopathy: Secondary | ICD-10-CM

## 2013-11-25 DIAGNOSIS — N289 Disorder of kidney and ureter, unspecified: Secondary | ICD-10-CM

## 2013-11-25 DIAGNOSIS — M439 Deforming dorsopathy, unspecified: Secondary | ICD-10-CM | POA: Insufficient documentation

## 2013-11-25 DIAGNOSIS — I10 Essential (primary) hypertension: Secondary | ICD-10-CM

## 2013-11-25 DIAGNOSIS — M4120 Other idiopathic scoliosis, site unspecified: Secondary | ICD-10-CM | POA: Insufficient documentation

## 2013-11-25 DIAGNOSIS — N189 Chronic kidney disease, unspecified: Secondary | ICD-10-CM

## 2013-11-25 DIAGNOSIS — C61 Malignant neoplasm of prostate: Secondary | ICD-10-CM

## 2013-11-25 LAB — UIFE/LIGHT CHAINS/TP QN, 24-HR UR
Albumin, U: DETECTED
Alpha 1, Urine: DETECTED — AB
Alpha 2, Urine: DETECTED — AB
Beta, Urine: DETECTED — AB
Free Kappa Lt Chains,Ur: 0.57 mg/dL (ref 0.14–2.42)
Free Kappa/Lambda Ratio: 19 ratio — ABNORMAL HIGH (ref 2.04–10.37)
Free Lambda Excretion/Day: 0.81 mg/d
Free Lambda Lt Chains,Ur: 0.03 mg/dL (ref 0.02–0.67)
Free Lt Chn Excr Rate: 15.39 mg/d
Gamma Globulin, Urine: DETECTED — AB
Time: 24 hours
Total Protein, Urine-Ur/day: 30 mg/d (ref 10–140)
Total Protein, Urine: 1.1 mg/dL
Volume, Urine: 2700 mL

## 2013-11-25 NOTE — Telephone Encounter (Signed)
gv and printed appt sched and avs for pt for June... °

## 2013-11-25 NOTE — Progress Notes (Signed)
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 Smothering Multiple myeloma    CURRENT TREATMENT: Active surveillance.     Multiple myeloma   01/21/2010 Initial Diagnosis Smothering Multiple myeloma, IgG Kappa. (normal calcium, hemoglobin, negative skeletal survey, creatinine less than 2).    01/21/2010 Bone Marrow Biopsy 11 % plasma cells.    02/18/2010 Procedure Left [name] biopsy showed moderate to severe arterial nephrosclerosis with moderate to severe tubulointerstitial scarring. Immunofluorescence microsocopy showed monoclonal kappa chain stain along the tubular BM.   11/02/2012 Imaging Metastatic survey showed no acute changes from prior.   11/02/2012 Tumor Marker Serum kappa light chains was 60.8.  M protein on serum protein electrophoresis was 1.62 g/dL.    11/09/2012 Tumor Marker Beta 2 microglobulin was 2.58   03/21/2013 Tumor Marker IgG level was 2020, IgA level 51, IgM 16.   11/18/2013 Tumor Marker IgG 1900; Kappa light chains 62.5; M-spike, 1.45 g/dL.  Creatinine 1.8.   11/25/2013 Imaging Metastatic skeletal survey negative for lytic lesions. Faint areas of radiolucency in the frontal bones of the skull and mid-shaft of the left humerus felt to be stable.      INTERVAL HISTORY: Thomas [name] 79 y.o. male with a history of IgG Kappa Smothering MM is here for follow-up.  She was last seen by me on 07/29/2013.  He is accompanied by [name].  He reports having a prostate biopsy about one month ago demonstrating stable disease.  He has been experiencing constipation for the past several days.  He is taking dulcolax as needed.  He  denies blood in his stools or melena.  He denies infections or bone pain.    MEDICAL HISTORY: Past Medical History  Diagnosis Date  . Hypertension   . Hypercholesterolemia   . Chronic [name] disease     Hx of bilateral renal cysts.  . History of atrial fibrillation 03/09/09  . Mild aortic stenosis   . Glaucoma   . BPH (benign prostatic hypertrophy)   . Smoldering multiple myeloma   . Prostate cancer 7/12    T1C Ad Ca  s/p TURP, observation only  . Hx of epiglottitis     2011    INTERIM HISTORY: has Multiple myeloma; Renal insufficiency; Hypertension; Hypercholesterolemia; Chronic [name] disease; Mild aortic stenosis; BPH (benign prostatic hypertrophy); Melena; Paroxysmal atrial fibrillation; and Encounter for long-term (current) use of other medications on his problem list.    ALLERGIES:  is allergic to ceftriaxone sodium; penicillins; contrast media; levaquin; and nsaids.  MEDICATIONS: has a current medication list which includes the following prescription(s): amiodarone, aspirin, brimonidine-timolol, sm calcium 600+d plus minerals, clobetasol cream, diltiazem, krill oil, metoprolol, travoprost (bak free), and boostrix.  SURGICAL HISTORY:  Past Surgical History  Procedure Laterality Date  . Adrenalectomy  02/12/11    Left, for Pheochromocytoma  . Cholecystectomy  02/12/11  . Gastrectomy  1984    S/P gastrectomy for ulcer  . Knee surgery  1971    S/P Right knee surgery  . Transurethral resection of prostate  7/12   PROBLEM LISTS: 1. Hypertension.  2. History of recurrent atrial fibrillation.  3. Mild aortic stenosis.  4. Glaucoma.  5. Bilateral renal cysts.  6. 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 Rana Snare.  7. History of hypercholesterolemia.  8. History of bleeding gastric ulcer in 1984, status post vagotomy and pyloroplasty.  9. Status post right knee surgery in 1971.  10. Status post left adrenalectomy  for a pheochromocytoma by Dr. Raynelle Bring on 02/02/2011.  REVIEW OF SYSTEMS:   Constitutional: Denies fevers, chills or abnormal weight loss Eyes: Denies blurriness of vision Ears, nose, mouth, throat, and face: Denies mucositis or sore throat Respiratory: Denies cough, dyspnea or wheezes Cardiovascular: Denies palpitation, chest discomfort or lower extremity swelling Gastrointestinal:  Denies nausea, heartburn; changes in bowel habits as noted above Skin: Denies abnormal skin rashes Lymphatics: Denies new lymphadenopathy or easy bruising Neurological:Denies numbness, tingling or new weaknesses Behavioral/Psych: Mood is stable, no new changes  All other systems were reviewed with the patient and are negative.  PHYSICAL EXAMINATION: ECOG PERFORMANCE STATUS: 0 - Asymptomatic  Blood pressure 144/72, pulse 55, temperature 98 F (36.7 C), temperature source Oral, resp. rate 20, height _0  (1.676 m), weight 212 lb 11.2 oz (96.48 kg), SpO2 97.00%.  GENERAL:alert, no distress and comfortable; mildly obese. Elderly male.  SKIN: skin color, texture, turgor are normal, no rashes or significant lesions EYES: normal, Conjunctiva are pink and non-injected, sclera clear OROPHARYNX:no exudate, no erythema and lips, buccal mucosa, and tongue normal  NECK: supple, thyroid normal size, non-tender, without nodularity LYMPH:  no palpable lymphadenopathy in the cervical, axillary or supraclavicular LUNGS: clear to auscultation with normal breathing effort, no wheezes or rhonchi HEART: regular rate & rhythm and soft SEM and no lower extremity edema ABDOMEN:abdomen soft, non-tender and normal bowel sounds Musculoskeletal:no cyanosis of digits and no clubbing  NEURO: alert & oriented x 3 with fluent speech, no focal motor/sensory deficits  Labs:  Lab Results  Component Value Date   WBC 7.2 11/18/2013   HGB 15.9 11/18/2013   HCT 46.9 11/18/2013   MCV 95.4 11/18/2013   PLT 192 11/18/2013   NEUTROABS 3.3  11/18/2013      Chemistry      Component Value Date/Time   NA 138 11/18/2013 1108   NA 138 01/25/2012 1159   K 4.1 11/18/2013 1108   K 4.2 01/25/2012 1159   CL 109* 03/21/2013 0958   CL 107 01/25/2012 1159   CO2 26 11/18/2013 1108   CO2 25 01/25/2012 1159   BUN 20.2 11/18/2013 1108   BUN 17 01/25/2012 1159   CREATININE 1.8* 11/18/2013 1108   CREATININE 1.66* 01/25/2012 1159      Component Value Date/Time   CALCIUM 9.9 11/18/2013 1108   CALCIUM 9.1 01/25/2012 1159   ALKPHOS 75 11/18/2013 1108   ALKPHOS 73 01/25/2012 1159   AST 30 11/18/2013 1108   AST 27 01/25/2012 1159   ALT 34 11/18/2013 1108   ALT 34 01/25/2012 1159   BILITOT 0.74 11/18/2013 1108   BILITOT 0.5 01/25/2012 1159     Studies:  Dg Bone Survey Met  11/25/2013   CLINICAL DATA:  History of multiple myeloma  EXAM: METASTATIC BONE SURVEY  COMPARISON:  DG BONE SURVEY MET dated 11/02/2012; DG BONE SURVEY MET dated 02/06/2012  FINDINGS: No new discrete lytic lesions.  Faint areas of apparent lucency involving frontal bone of the skull appears grossly unchanged. No new discrete calvarial lesions.  Subtle lucent lesion involving the midshaft of the left humerus is grossly unchanged.  Mild (<25%) compression deformities within 2 adjacent mid thoracic vertebral bodies  appears grossly unchanged. Lumbar and cervical vertebral body heights are grossly preserved. Mild scoliotic curvature of the thoracolumbar spine, grossly unchanged.  There is multilevel the within the cervical, thoracic and lumbar spine, grossly unchanged. There is partial ossification of the nuchal ligament.  Mild degenerate change of the pubic symphysis.  Multiple loose bodies are again seen within the medial aspect of the right knee joint space.  Grossly unchanged borderline enlarged cardiac silhouette and mediastinal contours with mild tortuosity and possible slight ectasia of the thoracic aorta. Surgical clips overlie the expected location of the gastroesophageal junction. Post  cholecystectomy.  IMPRESSION: 1. No new discrete lytic lesions to suggest progression of multiple myeloma. 2. Grossly unchanged ill-defined lucencies within the frontal calvarium and left humeral diaphysis.   Electronically Signed   By: Sandi Mariscal M.D.   On: 11/25/2013 15:25      ASSESSMENT: Thomas [name] 79 y.o. male with a history of MGUS (monoclonal gammopathy of unknown significance) - Plan: CBC with Differential, Comprehensive metabolic panel (Cmet) - [facility], IgG, IgA, IgM, Kappa/lambda light chains  Multiple myeloma  Chronic [name] disease   PLAN:  Based on NCCN guidelines defining end organ damage with creatinine greater than 2 or a hemoglobin less than 10, he would be considered as smothering Multiple myeloma.   1. Smothering IgA kappa multiple myeloma  --He is doing well clinically. For now, we will continue surveillance every 3-4 months with labs including CBC, CMP, SPEP and kappa/lamba. We discussed that there is 10% chance per year for the first 5 years that his smothering MM would become active symptomatic multiple myeloma. Mr. Veron seems to have stable disease. There has been no evidence of progression or any symptoms. His renal function seems to be stable. Metastatic survey is as noted above.   2. Chronic [name] Disease.  --His creatinine is stable.  3. Follow-up.  -- The patient will return in 4 months, at which time we will  check CBC, chemistries, quantitative immunoglobulins, and serum light chains.    All questions were answered. The patient knows to call the clinic with any problems, questions or concerns. We can certainly see the patient much sooner if necessary.  I spent 15 minutes counseling the patient face to face. The total time spent in the appointment was 25 minutes.    Gimena Buick, MD 11/25/2013 9:28 PM

## 2013-11-25 NOTE — Telephone Encounter (Signed)
Patient made aware of skeletal survey results.  No new lytic lesions noted.

## 2013-12-05 ENCOUNTER — Ambulatory Visit: Payer: Medicare Other | Admitting: Interventional Cardiology

## 2013-12-12 ENCOUNTER — Encounter: Payer: Self-pay | Admitting: Physician Assistant

## 2013-12-12 ENCOUNTER — Ambulatory Visit (INDEPENDENT_AMBULATORY_CARE_PROVIDER_SITE_OTHER): Payer: Medicare Other | Admitting: Physician Assistant

## 2013-12-12 ENCOUNTER — Ambulatory Visit: Payer: Medicare Other | Admitting: Physician Assistant

## 2013-12-12 VITALS — BP 149/76 | HR 49 | Ht 66.0 in | Wt 212.4 lb

## 2013-12-12 DIAGNOSIS — I10 Essential (primary) hypertension: Secondary | ICD-10-CM

## 2013-12-12 DIAGNOSIS — I480 Paroxysmal atrial fibrillation: Secondary | ICD-10-CM

## 2013-12-12 DIAGNOSIS — I4891 Unspecified atrial fibrillation: Secondary | ICD-10-CM

## 2013-12-12 NOTE — Progress Notes (Signed)
HPI:  This is a very pleasant 79 year old male patient [name] who has history of paroxysmal atrial fibrillation, hypertension, mild aortic stenosis, and obstructive sleep apnea. He was recently started on amiodarone for increase in atrial fibrillation. Since he's been on this drug he has had no breakthrough atrial fibrillation. He says he did have some tingling in his arms and trouble sleeping when he first started taking it but this has resolved. He denies any chest pain, palpitations, dyspnea, dyspnea on exertion, dizziness, or presyncope.   Allergies-- Ceftriaxone Sodium -- Hives  -- Penicillins -- Hives  -- Contrast Media [Iodinated Diagnostic Agents]    --  Poor kidney function  -- Levaquin [Levofloxacin]   -- Nsaids   Current Outpatient Prescriptions on File Prior to Visit: amiodarone (PACERONE) 200 MG tablet, Take 1 tablet (200 mg total) by mouth daily., Disp: 30 tablet, Rfl: 11 aspirin 81 MG tablet, Take 81 mg by mouth daily., Disp: , Rfl:  BOOSTRIX 5-2.5-18.5 injection, , Disp: , Rfl:  brimonidine-timolol (COMBIGAN) 0.2-0.5 % ophthalmic solution, Place 1 drop into both eyes every 12 (twelve) hours.  , Disp: , Rfl:  Calcium Carbonate-Vit D-Min (SM CALCIUM 600+D PLUS MINERALS) 600-800 MG-UNIT CHEW, Chew 1 tablet by mouth daily., Disp: , Rfl:  clobetasol cream (TEMOVATE) 0.05 %, , Disp: , Rfl:  diltiazem (CARTIA XT) 240 MG 24 hr capsule, Take 240 mg by mouth daily.  , Disp: , Rfl:  KRILL OIL PO, Take by mouth daily., Disp: , Rfl:  metoprolol (LOPRESSOR) 50 MG tablet, Take 50 mg by mouth 2 (two) times daily.  , Disp: , Rfl:  Travoprost, BAK Free, (TRAVATAN) 0.004 % SOLN ophthalmic solution, Place 1 drop into both eyes daily. , Disp: , Rfl:   No current facility-administered medications on file prior to visit.   Past Medical History:   Hypertension                                                 Hypercholesterolemia                                         Chronic kidney  disease                                         Comment:Hx of bilateral renal cysts.   History of atrial fibrillation                  03/09/09       Mild aortic stenosis                                         Glaucoma                                                     BPH (benign prostatic hypertrophy)  Smoldering multiple myeloma                                  Prostate cancer                                 7/12           Comment:T1C Ad Ca  s/p TURP, observation only   Hx of epiglottitis                                             Comment:2011  Past Surgical History:   ADRENALECTOMY                                    02/12/11        Comment:Left, for Pheochromocytoma   CHOLECYSTECTOMY                                  02/12/11      GASTRECTOMY                                      1984           Comment:S/P gastrectomy for ulcer   KNEE SURGERY                                     1971           Comment:S/P Right knee surgery   TRANSURETHRAL RESECTION OF PROSTATE              7/12        Review of patient's family history indicates:   Prostate cancer                Father                   Melanoma                       Brother                  Prostate cancer                Brother                  Social History   Marital Status: Married             Spouse Name:                      Years of Education:                 Number of children: 3           Occupational History Occupation          Tax inspector  Social History Main Topics   Smoking Status: Never Smoker                     Smokeless Status: Not on file                      Alcohol Use:                Drug Use:                Sexual Activity:                    Other Topics            Concern   None on file  Social History Narrative   None on file    ROS:   PHYSICAL EXAM: Well-nournished, in no acute distress. Neck: No JVD, HJR, Bruit, or thyroid  enlargement  Lungs: No tachypnea, clear without wheezing, rales, or rhonchi  Cardiovascular: RRR, PMI not displaced, 7-9/4 systolic murmur at the left sternal border, no gallops, bruit, thrill, or heave.  Abdomen: BS normal. Soft without organomegaly, masses, lesions or tenderness.  Extremities: without cyanosis, clubbing or edema. Good distal pulses bilateral  SKin: Warm, no lesions or rashes   Musculoskeletal: No deformities  Neuro: no focal signs  BP 149/76  Pulse 49  Ht _0  (1.676 m)  Wt 212 lb 6.4 oz (96.344 kg)  BMI 34.30 kg/m2    EKG: Sinus bradycardia 49 beats per minute

## 2013-12-12 NOTE — Assessment & Plan Note (Signed)
The patient hasn't had any breakthrough atrial fibrillation but since being started on amiodarone. He is bradycardic. Because of this I will stop his Cardizem. We will continue metoprolol. He says that Cardizem is more expensive and he preferred to stop this drug. He does have hypertension his blood pressure is up a little today. He may need to increase her metoprolol or another drug. Recommend 2 g sodium diet. I will see him back in 2 weeks.

## 2013-12-12 NOTE — Patient Instructions (Addendum)
STOP CARTIA XT  PLEASE FOLLOW UP WITH MICHELLE LENZE, PA ON 12/30/13 @ 10:45   2 Gram Low Sodium Diet A 2 gram sodium diet restricts the amount of sodium in the diet to no more than 2 g or 2000 mg daily. Limiting the amount of sodium is often used to help lower blood pressure. It is important if you have heart, liver, or kidney problems. Many foods contain sodium for flavor and sometimes as a preservative. When the amount of sodium in a diet needs to be low, it is important to know what to look for when choosing foods and drinks. The following includes some information and guidelines to help make it easier for you to adapt to a low sodium diet. QUICK TIPS  Do not add salt to food.  Avoid convenience items and fast food.  Choose unsalted snack foods.  Buy lower sodium products, often labeled as "lower sodium" or "no salt added."  Check food labels to learn how much sodium is in 1 serving.  When eating at a restaurant, ask that your food be prepared with less salt or none, if possible. READING FOOD LABELS FOR SODIUM INFORMATION The nutrition facts label is a good place to find how much sodium is in foods. Look for products with no more than 500 to 600 mg of sodium per meal and no more than 150 mg per serving. Remember that 2 g = 2000 mg. The food label may also list foods as:  Sodium-free: Less than 5 mg in a serving.  Very low sodium: 35 mg or less in a serving.  Low-sodium: 140 mg or less in a serving.  Light in sodium: 50% less sodium in a serving. For example, if a food that usually has 300 mg of sodium is changed to become light in sodium, it will have 150 mg of sodium.  Reduced sodium: 25% less sodium in a serving. For example, if a food that usually has 400 mg of sodium is changed to reduced sodium, it will have 300 mg of sodium. CHOOSING FOODS Grains  Avoid: Salted crackers and snack items. Some cereals, including instant hot cereals. Bread stuffing and biscuit mixes.  Seasoned rice or pasta mixes.  Choose: Unsalted snack items. Low-sodium cereals, oats, puffed wheat and rice, shredded wheat. English muffins and bread. Pasta. Meats  Avoid: Salted, canned, smoked, spiced, pickled meats, including fish and poultry. Bacon, ham, sausage, cold cuts, hot dogs, anchovies.  Choose: Low-sodium canned tuna and salmon. Fresh or frozen meat, poultry, and fish. Dairy  Avoid: Processed cheese and spreads. Cottage cheese. Buttermilk and condensed milk. Regular cheese.  Choose: Milk. Low-sodium cottage cheese. Yogurt. Sour cream. Low-sodium cheese. Fruits and Vegetables  Avoid: Regular canned vegetables. Regular canned tomato sauce and paste. Frozen vegetables in sauces. Olives. Angie Fava. Relishes. Sauerkraut.  Choose: Low-sodium canned vegetables. Low-sodium tomato sauce and paste. Frozen or fresh vegetables. Fresh and frozen fruit. Condiments  Avoid: Canned and packaged gravies. Worcestershire sauce. Tartar sauce. Barbecue sauce. Soy sauce. Steak sauce. Ketchup. Onion, garlic, and table salt. Meat flavorings and tenderizers.  Choose: Fresh and dried herbs and spices. Low-sodium varieties of mustard and ketchup. Lemon juice. Tabasco sauce. Horseradish. SAMPLE 2 GRAM SODIUM MEAL PLAN Breakfast / Sodium (mg)  1 cup low-fat milk / 267 mg  2 slices whole-wheat toast / 270 mg  1 tbs heart-healthy margarine / 153 mg  1 hard-boiled egg / 139 mg  1 small orange / 0 mg Lunch / Sodium (mg)  1 cup  raw carrots / 76 mg   cup hummus / 298 mg  1 cup low-fat milk / 143 mg   cup red grapes / 2 mg  1 whole-wheat pita bread / 356 mg Dinner / Sodium (mg)  1 cup whole-wheat pasta / 2 mg  1 cup low-sodium tomato sauce / 73 mg  3 oz lean ground beef / 57 mg  1 small side salad (1 cup raw spinach leaves,  cup cucumber,  cup yellow bell pepper) with 1 tsp olive oil and 1 tsp red wine vinegar / 25 mg Snack / Sodium (mg)  1 container low-fat vanilla yogurt / 107  mg  3 graham cracker squares / 127 mg Nutrient Analysis  Calories: 2033  Protein: 77 g  Carbohydrate: 282 g  Fat: 72 g  Sodium: 1971 mg Document Released: 09/19/2005 Document Revised: 12/12/2011 Document Reviewed: 12/21/2009 [employer] Patient Information 2014 [place], [place].

## 2013-12-12 NOTE — Assessment & Plan Note (Signed)
He does have hypertension his blood pressure is up a little today. He may need to increase her metoprolol or another drug. Recommend 2 g sodium diet. I will see him back in 2 weeks.

## 2013-12-24 ENCOUNTER — Telehealth: Payer: Self-pay | Admitting: Interventional Cardiology

## 2013-12-24 NOTE — Telephone Encounter (Signed)
called pt with Dr.Smith instructions. per Dr.Smith pt to come in for an EKG. pt will come in tomorrow for a nurse visit EKG @ 11am...pt aware and verbalized understanding

## 2013-12-24 NOTE — Telephone Encounter (Signed)
returned pt call.pt sts that he has been in afib since 12/20/13 Fri night.pt ic currently taking amiodarone 200mg  daily,metoprolol 50mg  bid, diltiazem was d/c by M.Lenze at last o/v due to brady.pt sts that his hr has been ranging 85-88bpm.pt is having sob it could be heard while on the phone with pt.pt adv that Dr.Smith will be back in the office after 3pm I will talk with him and call pt back with Dr.Smith recommendations.pt verbalized understanding.

## 2013-12-24 NOTE — Telephone Encounter (Signed)
New message     Patient calling   In the office a couple of week ago . Medication was changed . Having irregular heart rate.

## 2013-12-25 ENCOUNTER — Inpatient Hospital Stay (HOSPITAL_COMMUNITY): Payer: Medicare Other

## 2013-12-25 ENCOUNTER — Ambulatory Visit (INDEPENDENT_AMBULATORY_CARE_PROVIDER_SITE_OTHER): Payer: Medicare Other | Admitting: Interventional Cardiology

## 2013-12-25 ENCOUNTER — Encounter (HOSPITAL_COMMUNITY): Payer: Self-pay | Admitting: General Practice

## 2013-12-25 ENCOUNTER — Inpatient Hospital Stay (HOSPITAL_COMMUNITY)
Admission: AD | Admit: 2013-12-25 | Discharge: 2013-12-27 | DRG: 308 | Disposition: A | Discharge status: Home / Self Care | Payer: Medicare Other | Source: Ambulatory Visit | Attending: Interventional Cardiology | Admitting: Interventional Cardiology

## 2013-12-25 ENCOUNTER — Encounter (INDEPENDENT_AMBULATORY_CARE_PROVIDER_SITE_OTHER): Payer: Medicare Other

## 2013-12-25 ENCOUNTER — Encounter: Payer: Self-pay | Admitting: Interventional Cardiology

## 2013-12-25 VITALS — BP 110/70 | HR 97 | Ht 66.0 in | Wt 200.5 lb

## 2013-12-25 DIAGNOSIS — N189 Chronic kidney disease, unspecified: Secondary | ICD-10-CM

## 2013-12-25 DIAGNOSIS — I480 Paroxysmal atrial fibrillation: Secondary | ICD-10-CM | POA: Diagnosis present

## 2013-12-25 DIAGNOSIS — Z79899 Other long term (current) drug therapy: Secondary | ICD-10-CM

## 2013-12-25 DIAGNOSIS — Z8042 Family history of malignant neoplasm of prostate: Secondary | ICD-10-CM

## 2013-12-25 DIAGNOSIS — I359 Nonrheumatic aortic valve disorder, unspecified: Secondary | ICD-10-CM | POA: Diagnosis present

## 2013-12-25 DIAGNOSIS — I5033 Acute on chronic diastolic (congestive) heart failure: Secondary | ICD-10-CM

## 2013-12-25 DIAGNOSIS — I10 Essential (primary) hypertension: Secondary | ICD-10-CM | POA: Diagnosis present

## 2013-12-25 DIAGNOSIS — I4891 Unspecified atrial fibrillation: Principal | ICD-10-CM | POA: Diagnosis present

## 2013-12-25 DIAGNOSIS — H409 Unspecified glaucoma: Secondary | ICD-10-CM | POA: Diagnosis present

## 2013-12-25 DIAGNOSIS — I129 Hypertensive chronic kidney disease with stage 1 through stage 4 chronic kidney disease, or unspecified chronic kidney disease: Secondary | ICD-10-CM | POA: Diagnosis present

## 2013-12-25 DIAGNOSIS — G4730 Sleep apnea, unspecified: Secondary | ICD-10-CM | POA: Diagnosis present

## 2013-12-25 DIAGNOSIS — G4733 Obstructive sleep apnea (adult) (pediatric): Secondary | ICD-10-CM

## 2013-12-25 DIAGNOSIS — Z7982 Long term (current) use of aspirin: Secondary | ICD-10-CM

## 2013-12-25 DIAGNOSIS — E7800 Pure hypercholesterolemia, unspecified: Secondary | ICD-10-CM | POA: Diagnosis present

## 2013-12-25 DIAGNOSIS — Z8546 Personal history of malignant neoplasm of prostate: Secondary | ICD-10-CM

## 2013-12-25 DIAGNOSIS — I350 Nonrheumatic aortic (valve) stenosis: Secondary | ICD-10-CM | POA: Diagnosis present

## 2013-12-25 DIAGNOSIS — I5032 Chronic diastolic (congestive) heart failure: Secondary | ICD-10-CM

## 2013-12-25 DIAGNOSIS — N184 Chronic kidney disease, stage 4 (severe): Secondary | ICD-10-CM | POA: Diagnosis present

## 2013-12-25 DIAGNOSIS — Z808 Family history of malignant neoplasm of other organs or systems: Secondary | ICD-10-CM

## 2013-12-25 DIAGNOSIS — C9000 Multiple myeloma not having achieved remission: Secondary | ICD-10-CM | POA: Diagnosis present

## 2013-12-25 DIAGNOSIS — R5381 Other malaise: Secondary | ICD-10-CM | POA: Diagnosis present

## 2013-12-25 DIAGNOSIS — R5383 Other fatigue: Secondary | ICD-10-CM

## 2013-12-25 DIAGNOSIS — K922 Gastrointestinal hemorrhage, unspecified: Secondary | ICD-10-CM | POA: Diagnosis present

## 2013-12-25 DIAGNOSIS — N400 Enlarged prostate without lower urinary tract symptoms: Secondary | ICD-10-CM | POA: Diagnosis present

## 2013-12-25 DIAGNOSIS — I509 Heart failure, unspecified: Secondary | ICD-10-CM

## 2013-12-25 HISTORY — DX: Cardiac murmur, unspecified: R01.1

## 2013-12-25 HISTORY — DX: Sleep apnea, unspecified: G47.30

## 2013-12-25 HISTORY — DX: Other complications of anesthesia, initial encounter: T88.59XA

## 2013-12-25 LAB — CBC WITH DIFFERENTIAL/PLATELET
Basophils Absolute: 0 10*3/uL (ref 0.0–0.1)
Basophils Relative: 0 % (ref 0–1)
Eosinophils Absolute: 0 10*3/uL (ref 0.0–0.7)
Eosinophils Relative: 0 % (ref 0–5)
HCT: 46.7 % (ref 39.0–52.0)
Hemoglobin: 17 g/dL (ref 13.0–17.0)
Lymphocytes Relative: 28 % (ref 12–46)
Lymphs Abs: 2.6 10*3/uL (ref 0.7–4.0)
MCH: 33.3 pg (ref 26.0–34.0)
MCHC: 36.4 g/dL — ABNORMAL HIGH (ref 30.0–36.0)
MCV: 91.6 fL (ref 78.0–100.0)
Monocytes Absolute: 1.1 10*3/uL — ABNORMAL HIGH (ref 0.1–1.0)
Monocytes Relative: 12 % (ref 3–12)
Neutro Abs: 5.5 10*3/uL (ref 1.7–7.7)
Neutrophils Relative %: 60 % (ref 43–77)
Platelets: 153 10*3/uL (ref 150–400)
RBC: 5.1 MIL/uL (ref 4.22–5.81)
RDW: 13.4 % (ref 11.5–15.5)
WBC: 9.2 10*3/uL (ref 4.0–10.5)

## 2013-12-25 LAB — TSH: TSH: 2.329 u[IU]/mL (ref 0.350–4.500)

## 2013-12-25 LAB — TROPONIN I
Troponin I: <0.3 ng/mL (ref <0.30)
Troponin I: <0.3 ng/mL (ref <0.30)

## 2013-12-25 LAB — COMPREHENSIVE METABOLIC PANEL
ALT: 29 U/L (ref 0–53)
AST: 41 U/L — ABNORMAL HIGH (ref 0–37)
Albumin: 3.7 g/dL (ref 3.5–5.2)
Alkaline Phosphatase: 63 U/L (ref 39–117)
BUN: 26 mg/dL — ABNORMAL HIGH (ref 6–23)
CO2: 22 mEq/L (ref 19–32)
Calcium: 9.2 mg/dL (ref 8.4–10.5)
Chloride: 98 mEq/L (ref 96–112)
Creatinine, Ser: 1.95 mg/dL — ABNORMAL HIGH (ref 0.50–1.35)
GFR calc Af Amer: 37 mL/min — ABNORMAL LOW (ref >90)
GFR calc non Af Amer: 32 mL/min — ABNORMAL LOW (ref >90)
Glucose, Bld: 96 mg/dL (ref 70–99)
Potassium: 4.3 mEq/L (ref 3.7–5.3)
Sodium: 134 mEq/L — ABNORMAL LOW (ref 137–147)
Total Bilirubin: 0.7 mg/dL (ref 0.3–1.2)
Total Protein: 8 g/dL (ref 6.0–8.3)

## 2013-12-25 LAB — HEMOGLOBIN A1C
Hgb A1c MFr Bld: 5.4 % (ref <5.7)
Mean Plasma Glucose: 108 mg/dL (ref <117)

## 2013-12-25 LAB — PRO B NATRIURETIC PEPTIDE: Pro B Natriuretic peptide (BNP): 1249 pg/mL — ABNORMAL HIGH (ref 0–450)

## 2013-12-25 MED ORDER — BRIMONIDINE TARTRATE 0.2 % OP SOLN
1.0000 [drp] | Freq: Two times a day (BID) | OPHTHALMIC | Status: DC
  Administered 2013-12-25 – 2013-12-27 (×4): 1 [drp] via OPHTHALMIC
  Filled 2013-12-25: qty 5

## 2013-12-25 MED ORDER — LATANOPROST 0.005 % OP SOLN
1.0000 [drp] | Freq: Every day | OPHTHALMIC | Status: DC
  Administered 2013-12-25 – 2013-12-26 (×2): 1 [drp] via OPHTHALMIC
  Filled 2013-12-25: qty 2.5

## 2013-12-25 MED ORDER — SODIUM CHLORIDE 0.9 % IJ SOLN
3.0000 mL | Freq: Two times a day (BID) | INTRAMUSCULAR | Status: DC
  Administered 2013-12-25 (×2): 3 mL via INTRAVENOUS

## 2013-12-25 MED ORDER — SODIUM CHLORIDE 0.9 % IV SOLN
250.0000 mL | INTRAVENOUS | Status: DC | PRN
  Administered 2013-12-26: 250 mL via INTRAVENOUS

## 2013-12-25 MED ORDER — POTASSIUM CHLORIDE CRYS ER 20 MEQ PO TBCR
20.0000 meq | EXTENDED_RELEASE_TABLET | Freq: Once | ORAL | Status: AC
  Administered 2013-12-25: 20 meq via ORAL
  Filled 2013-12-25: qty 1

## 2013-12-25 MED ORDER — FUROSEMIDE 10 MG/ML IJ SOLN
80.0000 mg | Freq: Once | INTRAMUSCULAR | Status: AC
  Administered 2013-12-25: 80 mg via INTRAVENOUS
  Filled 2013-12-25: qty 8

## 2013-12-25 MED ORDER — SODIUM CHLORIDE 0.9 % IV SOLN
INTRAVENOUS | Status: DC
  Administered 2013-12-26: 01:00:00 via INTRAVENOUS
  Administered 2013-12-26: 500 mL via INTRAVENOUS
  Administered 2013-12-26: 06:00:00 via INTRAVENOUS

## 2013-12-25 MED ORDER — CLOBETASOL PROPIONATE 0.05 % EX CREA
TOPICAL_CREAM | Freq: Two times a day (BID) | CUTANEOUS | Status: DC
  Filled 2013-12-25: qty 15

## 2013-12-25 MED ORDER — SODIUM CHLORIDE 0.9 % IJ SOLN: 3.0000 mL | INTRAMUSCULAR | Status: DC | PRN

## 2013-12-25 MED ORDER — TIMOLOL MALEATE 0.5 % OP SOLN
1.0000 [drp] | Freq: Two times a day (BID) | OPHTHALMIC | Status: DC
  Administered 2013-12-25 – 2013-12-27 (×4): 1 [drp] via OPHTHALMIC
  Filled 2013-12-25: qty 5

## 2013-12-25 MED ORDER — CALCIUM CARBONATE-VITAMIN D 500-200 MG-UNIT PO TABS
1.0000 | ORAL_TABLET | Freq: Every day | ORAL | Status: DC
  Administered 2013-12-26 – 2013-12-27 (×2): 1 via ORAL
  Filled 2013-12-25 (×3): qty 1

## 2013-12-25 MED ORDER — HEPARIN (PORCINE) IN NACL 100-0.45 UNIT/ML-% IJ SOLN
900.0000 [IU]/h | INTRAMUSCULAR | Status: AC
  Administered 2013-12-25: 1150 [IU]/h via INTRAVENOUS
  Administered 2013-12-26: 1050 [IU]/h via INTRAVENOUS
  Filled 2013-12-25 (×4): qty 250

## 2013-12-25 MED ORDER — BRIMONIDINE TARTRATE-TIMOLOL 0.2-0.5 % OP SOLN: 1.0000 [drp] | Freq: Two times a day (BID) | OPHTHALMIC | Status: DC

## 2013-12-25 MED ORDER — HEPARIN BOLUS VIA INFUSION
4000.0000 [IU] | Freq: Once | INTRAVENOUS | Status: AC
  Administered 2013-12-25: 4000 [IU] via INTRAVENOUS
  Filled 2013-12-25: qty 4000

## 2013-12-25 MED ORDER — AMIODARONE HCL 200 MG PO TABS
200.0000 mg | ORAL_TABLET | Freq: Two times a day (BID) | ORAL | Status: DC
  Administered 2013-12-25 – 2013-12-27 (×4): 200 mg via ORAL
  Filled 2013-12-25 (×5): qty 1

## 2013-12-25 MED ORDER — SM CALCIUM 600+D PLUS MINERALS 600-800 MG-UNIT PO CHEW: 1.0000 | CHEWABLE_TABLET | Freq: Every day | ORAL | Status: DC

## 2013-12-25 MED ORDER — METOPROLOL TARTRATE 50 MG PO TABS
50.0000 mg | ORAL_TABLET | Freq: Two times a day (BID) | ORAL | Status: DC
  Administered 2013-12-25 – 2013-12-27 (×4): 50 mg via ORAL
  Filled 2013-12-25 (×5): qty 1

## 2013-12-25 NOTE — Patient Instructions (Signed)
You are being admitted to [facility] Please report to admitting Your bed assignment is [place] (Room 10)

## 2013-12-25 NOTE — Progress Notes (Signed)
Patient ID: Thomas [name], male   DOB: Feb 17, 1935, 79 y.o.   MRN: [id number]    [address] [place], [place]  [address] Phone: [phone] Fax:  [phone]  Date:  12/25/2013   ID:  Thomas [name] January 14, 1935, MRN [id number]  PCP:  [name], MD   ASSESSMENT:  1. Atrial fibrillation, persistent for at least 72 hours  2. Diastolic heart failure, mildly decompensated 3. Chronic kidney disease 4. Hypertension  PLAN:  1. We'll need to be admitted to the hospital   SUBJECTIVE: Thomas [name] is a 79 y.o. male who has felt terrible since Thursday, 4 days ago when he began having weakness, irregular heart rhythm, and at the same time began noticing chills, cough, nasal congestion, and shortness of breath. He feels as though atrial fibrillation has been present continuously since it started.  Amiodarone of been started 200 mg per day, approximately 6 weeks ago because he had been having intermittent episodes of atrial fibrillation. The plan was to try to control his rhythm and therefore avoid the need for anticoagulation in this patient who has had significant GI bleeding when on chronic anticoagulation.  He denies angina. He has been having some orthopnea, difficulty sleeping, and PND. There is no peripheral edema.   Wt Readings from Last 3 Encounters:  12/12/13 212 lb 6.4 oz (96.344 kg)  11/25/13 212 lb 11.2 oz (96.48 kg)  11/07/13 210 lb (95.255 kg)     Past Medical History  Diagnosis Date  . Hypertension   . Hypercholesterolemia   . Chronic kidney disease     Hx of bilateral renal cysts.  . History of atrial fibrillation 03/09/09  . Mild aortic stenosis   . Glaucoma   . BPH (benign prostatic hypertrophy)   . Smoldering multiple myeloma   . Prostate cancer 7/12    T1C Ad Ca  s/p TURP, observation only  . Hx of epiglottitis     2011    Current Outpatient Prescriptions  Medication Sig Dispense Refill  . amiodarone (PACERONE) 200 MG  tablet Take 1 tablet (200 mg total) by mouth daily.  30 tablet  11  . aspirin 81 MG tablet Take 81 mg by mouth daily.      Marland Kitchen BOOSTRIX 5-2.5-18.5 injection       . brimonidine-timolol (COMBIGAN) 0.2-0.5 % ophthalmic solution Place 1 drop into both eyes every 12 (twelve) hours.        . Calcium Carbonate-Vit D-Min (SM CALCIUM 600+D PLUS MINERALS) 600-800 MG-UNIT CHEW Chew 1 tablet by mouth daily.      . clobetasol cream (TEMOVATE) 0.05 %       . KRILL OIL PO Take by mouth daily.      . metoprolol (LOPRESSOR) 50 MG tablet Take 50 mg by mouth 2 (two) times daily.        . Travoprost, BAK Free, (TRAVATAN) 0.004 % SOLN ophthalmic solution Place 1 drop into both eyes daily.        No current facility-administered medications for this visit.    Allergies:    Allergies  Allergen Reactions  . Ceftriaxone Sodium Hives  . Penicillins Hives  . Contrast Media [Iodinated Diagnostic Agents]     Poor kidney function  . Levaquin [Levofloxacin]   . Nsaids     Social History:  The patient  reports that he has never smoked. He does not have any smokeless tobacco history on file.   ROS:  Please see the  history of present illness.      All other systems reviewed and negative.   OBJECTIVE: VS:  There were no vitals taken for this visit. Well nourished, well developed, in no acute distress,  HEENT: normal Neck: JVD . Carotid bruit   Cardiac:  normal S1, S2; RRR; no murmur Lungs:  clear to auscultation bilaterally, no wheezing, rhonchi or rales Abd: soft, nontender, no hepatomegaly Ext: Edema . Pulses  Skin: warm and dry Neuro:  CNs 2-12 intact, no focal abnormalities noted  EKG:  Atrial fibrillation with moderate rate control       Signed, Illene Labrador III, MD 12/25/2013 11:53 AM

## 2013-12-25 NOTE — H&P (Addendum)
Thomas [name] is a 79 y.o. male  Admit Date: 12/25/2013 Referring Physician: [name], M.D. Primary Cardiologist:: H. WB Blenda Bridegroom, M.D. Chief complaint / reason for admission: Persistent atrial fibrillation  HPI: Thomas [name] is a 79 y.o. male who has felt terrible since Thursday, 5 days ago,  when he began having weakness, irregular  rapid heart rhythm, and at the same time began noticing chills, cough, nasal congestion, and shortness of breath. He feels as though atrial fibrillation has been present continuously since that time. Amiodarone was started 200 mg per day, approximately 6 weeks ago because he had been having intermittent episodes of atrial fibrillation.   He has not had an episode to last this long. The plan was to try to control his rhythm and therefore avoid the need for anticoagulation in this patient who has had significant GI bleeding when on chronic anticoagulation. The long his heart has been out of rhythm, the worse he has fell. He denies angina. He has been having some orthopnea, difficulty sleeping, and PND. There is no peripheral edema.     PMH:    Past Medical History  Diagnosis Date  . Hypertension   . Hypercholesterolemia   . Chronic kidney disease     Hx of bilateral renal cysts.  . History of atrial fibrillation 03/09/09  . Mild aortic stenosis   . Glaucoma   . BPH (benign prostatic hypertrophy)   . Smoldering multiple myeloma   . Prostate cancer 7/12    T1C Ad Ca  s/p TURP, observation only  . Hx of epiglottitis     2011  . Complication of anesthesia     DIFFICULT TO WAKE   . Dysrhythmia     ATRIAL FIBRILATION  . Heart murmur   . Sleep apnea   . Arthritis     LEGS    PSH:    Past Surgical History  Procedure Laterality Date  . Adrenalectomy  02/12/11    Left, for Pheochromocytoma  . Cholecystectomy  02/12/11  . Gastrectomy  1984    S/P gastrectomy for ulcer  . Knee surgery  1971    S/P Right knee surgery  . Transurethral  resection of prostate  7/12   ALLERGIES:   Ceftriaxone sodium; Penicillins; Contrast media; Levaquin; and Nsaids Prior to Admit Meds:   Prescriptions prior to admission  Medication Sig Dispense Refill  . amiodarone (PACERONE) 200 MG tablet Take 1 tablet (200 mg total) by mouth daily.  30 tablet  11  . aspirin 81 MG tablet Take 81 mg by mouth daily.      Marland Kitchen BOOSTRIX 5-2.5-18.5 injection       . brimonidine-timolol (COMBIGAN) 0.2-0.5 % ophthalmic solution Place 1 drop into both eyes every 12 (twelve) hours.        . Calcium Carbonate-Vit D-Min (SM CALCIUM 600+D PLUS MINERALS) 600-800 MG-UNIT CHEW Chew 1 tablet by mouth daily.      . clobetasol cream (TEMOVATE) 0.05 %       . KRILL OIL PO Take by mouth daily.      . metoprolol (LOPRESSOR) 50 MG tablet Take 50 mg by mouth 2 (two) times daily.        . Travoprost, BAK Free, (TRAVATAN) 0.004 % SOLN ophthalmic solution Place 1 drop into both eyes daily.        Family HX:    Family History  Problem Relation Age of Onset  . Prostate cancer Father   . Melanoma Brother   .  Prostate cancer Brother    Social HX:    History   Social History  . Marital Status: Married    Spouse Name: N/A    Number of Children: 3  . Years of Education: N/A   Occupational History  .     Social History Main Topics  . Smoking status: Never Smoker   . Smokeless tobacco: Never Used  . Alcohol Use: No  . Drug Use: No  . Sexual Activity: Not on file   Other Topics Concern  . Not on file   Social History Narrative  . No narrative on file     ROS. Poor appetite. No neurological complaints. No blood in urine or stool. Orthopnea has been present. Nasal congestion, cough, chills, but no fever has been recorded.  Physical Exam: Blood pressure 121/82, pulse 103, temperature 97.7 F (36.5 C), temperature source Oral, height 5' 6"  (1.676 m), weight 200 lb 8 oz (90.946 kg), SpO2 97.00%.    Obese, appearing somewhat chronically ill.  No obvious neck vein  distention  With the patient lying at 45.no carotid bruits.  Chest reveals diminished breath sounds at both bases. No wheezing is heard. Faint rales are heard at the bases.   Cardiac exam reveals an irregularly irregular rhythm with only moderate rate control. 2 of 6 systolic murmur is heard at the right upper sternal border.   Abdomen is obese without obvious tenderness. Bowel sounds are normal.   Extremities reveal no edema. Pulses are 2+ and symmetric in the upper and lower extremities.   Neurological exam reveals no focal deficits. The patient's affect is depressed.   Labs: Lab Results  Component Value Date   WBC 9.2 12/25/2013   HGB 17.0 12/25/2013   HCT 46.7 12/25/2013   MCV 91.6 12/25/2013   PLT 153 12/25/2013    Recent Labs Lab 12/25/13 1455  NA 134*  K 4.3  CL 98  CO2 22  BUN 26*  CREATININE 1.95*  CALCIUM 9.2  PROT 8.0  BILITOT 0.7  ALKPHOS 63  ALT 29  AST 41*  GLUCOSE 96   Lab Results  Component Value Date   CKTOTAL 546* 12/06/2009   CKMB 4.5* 12/06/2009   TROPONINI <0.30 12/25/2013   BNP    Component Value Date/Time   PROBNP 1249.0* 12/25/2013 1455      Radiology:  Pending  EKG:   atrial fibrillation with ventricular rate of 97 beats per minute and nonspecific T-wave abnormality. Otherwise normal  ASSESSMENT:   1. Persistent atrial fibrillation in a patient who has had only paroxysmal episodes lasting usually less than several hours. He has not been in atrial fibrillation for greater than 96 hours. This is despite amiodarone therapy being started 5 weeks ago. It was not loaded however.   2. Fatigue and dyspnea likely related to acute on chronic diastolic heart failure   3. History of gastrointestinal bleeding on Coumadin therapy   4. Hypertension   5. Chronic kidney disease, stage III-IV  Plan:  1. Admit to hospital for anticoagulation with heparin  2. TEE guided cardioversion in a.m.  3. Oral anticoagulation therapy with a factor X A.  inhibitor for at least 3 weeks after cardioversion  4. I have increase amiodarone to a higher dose to hopefully achieve rhythm control once fully loaded  5. IV diuretic therapy if needed to treat acute heart failure  [name] 12/25/2013 5:44 PM

## 2013-12-25 NOTE — Progress Notes (Signed)
Pt c/o shortness of breath and HR sustaining 110-140's, sats 94 on room air, pt placed on 2L of O2 for comfort. On call Md paged, awaiting for response. Will continue to monitor patient.

## 2013-12-25 NOTE — Progress Notes (Addendum)
ANTICOAGULATION CONSULT NOTE - Initial Consult  Pharmacy Consult for heparin Indication: atrial fibrillation  Allergies  Allergen Reactions  . Ceftriaxone Sodium Hives  . Penicillins Hives  . Contrast Media [Iodinated Diagnostic Agents]     Poor kidney function  . Levaquin [Levofloxacin]   . Nsaids     Patient Measurements: Height: 5' 6"  (167.6 cm) Weight: 200 lb 8 oz (90.946 kg) IBW/kg (Calculated) : 63.8 Heparin Dosing Weight: 83.1 kg  Vital Signs: BP: 110/70 mmHg (03/25 1158) Pulse Rate: 97 (03/25 1158)  Labs: No results found for this basename: HGB, HCT, PLT, APTT, LABPROT, INR, HEPARINUNFRC, CREATININE, CKTOTAL, CKMB, TROPONINI,  in the last 72 hours  Estimated Creatinine Clearance: 36.8 ml/min (by C-G formula based on Cr of 1.8).   Medical History: Past Medical History  Diagnosis Date  . Hypertension   . Hypercholesterolemia   . Chronic kidney disease     Hx of bilateral renal cysts.  . History of atrial fibrillation 03/09/09  . Mild aortic stenosis   . Glaucoma   . BPH (benign prostatic hypertrophy)   . Smoldering multiple myeloma   . Prostate cancer 7/12    T1C Ad Ca  s/p TURP, observation only  . Hx of epiglottitis     2011    Medications:  Medication history not complete yet  Assessment: 79 y/o male with weakness, irregular heart rhythm, and cold symptoms over the last few days who presented to his cardiologist office and was found to be in Afib. He has a history of GI bleeding while on chronic anticoagulation. Pharmacy consulted to begin heparin. Last CBC was normal and SCr was 1.8 on 11/18/13.  Goal of Therapy:  Heparin level 0.3-0.7 units/ml Monitor platelets by anticoagulation protocol: Yes   Plan:  -Heparin 4000 units IV bolus then infuse at 1150 units/hr -8 hr heparin level -Daily heparin level and CBC -Monitor for s/sx of bleeding -F/U baseline labs  [facility], [place].D., BCPS Clinical Pharmacist Pager: [phone] 12/25/2013 1:45  PM   Addendum: H/H 17/46.7 pltc 153  Continue with current plan.  [place], Pharm.D., BCPS Clinical Pharmacist Pager: [phone] 12/25/2013 4:05 PM

## 2013-12-26 ENCOUNTER — Inpatient Hospital Stay (HOSPITAL_COMMUNITY): Payer: Medicare Other | Admitting: Certified Registered"

## 2013-12-26 ENCOUNTER — Ambulatory Visit: Payer: Medicare Other | Admitting: Physician Assistant

## 2013-12-26 ENCOUNTER — Encounter (HOSPITAL_COMMUNITY): Payer: Self-pay | Admitting: Certified Registered"

## 2013-12-26 ENCOUNTER — Encounter (HOSPITAL_COMMUNITY)
Admission: AD | Disposition: A | Discharge status: Home / Self Care | Payer: Self-pay | Source: Ambulatory Visit | Attending: Interventional Cardiology

## 2013-12-26 ENCOUNTER — Encounter (HOSPITAL_COMMUNITY): Payer: Medicare Other | Admitting: Certified Registered"

## 2013-12-26 DIAGNOSIS — I4891 Unspecified atrial fibrillation: Secondary | ICD-10-CM

## 2013-12-26 HISTORY — PX: CARDIOVERSION: SHX1299

## 2013-12-26 HISTORY — PX: TEE WITHOUT CARDIOVERSION: SHX5443

## 2013-12-26 LAB — CBC
HCT: 48 % (ref 39.0–52.0)
Hemoglobin: 17.2 g/dL — ABNORMAL HIGH (ref 13.0–17.0)
MCH: 33 pg (ref 26.0–34.0)
MCHC: 35.8 g/dL (ref 30.0–36.0)
MCV: 92 fL (ref 78.0–100.0)
Platelets: 177 10*3/uL (ref 150–400)
RBC: 5.22 MIL/uL (ref 4.22–5.81)
RDW: 13.6 % (ref 11.5–15.5)
WBC: 9.9 10*3/uL (ref 4.0–10.5)

## 2013-12-26 LAB — HEPARIN LEVEL (UNFRACTIONATED)
Heparin Unfractionated: 0.46 IU/mL (ref 0.30–0.70)
Heparin Unfractionated: 0.78 IU/mL — ABNORMAL HIGH (ref 0.30–0.70)
Heparin Unfractionated: 0.78 IU/mL — ABNORMAL HIGH (ref 0.30–0.70)

## 2013-12-26 LAB — BASIC METABOLIC PANEL
BUN: 30 mg/dL — ABNORMAL HIGH (ref 6–23)
CO2: 23 mEq/L (ref 19–32)
Calcium: 9.2 mg/dL (ref 8.4–10.5)
Chloride: 99 mEq/L (ref 96–112)
Creatinine, Ser: 2.07 mg/dL — ABNORMAL HIGH (ref 0.50–1.35)
GFR calc Af Amer: 34 mL/min — ABNORMAL LOW (ref >90)
GFR calc non Af Amer: 29 mL/min — ABNORMAL LOW (ref >90)
Glucose, Bld: 97 mg/dL (ref 70–99)
Potassium: 4 mEq/L (ref 3.7–5.3)
Sodium: 137 mEq/L (ref 137–147)

## 2013-12-26 LAB — TROPONIN I: Troponin I: <0.3 ng/mL (ref <0.30)

## 2013-12-26 SURGERY — ECHOCARDIOGRAM, TRANSESOPHAGEAL
Anesthesia: General

## 2013-12-26 MED ORDER — FENTANYL CITRATE 0.05 MG/ML IJ SOLN
INTRAMUSCULAR | Status: DC | PRN
  Administered 2013-12-26: 25 ug via INTRAVENOUS

## 2013-12-26 MED ORDER — BUTAMBEN-TETRACAINE-BENZOCAINE 2-2-14 % EX AERO
INHALATION_SPRAY | CUTANEOUS | Status: DC | PRN
Start: 2013-12-26 — End: 2013-12-26
  Administered 2013-12-26: 2 via TOPICAL

## 2013-12-26 MED ORDER — MIDAZOLAM HCL 5 MG/ML IJ SOLN
INTRAMUSCULAR | Status: AC
  Filled 2013-12-26: qty 2

## 2013-12-26 MED ORDER — PROPOFOL 10 MG/ML IV BOLUS
INTRAVENOUS | Status: DC | PRN
  Administered 2013-12-26: 40 mg via INTRAVENOUS

## 2013-12-26 MED ORDER — ONDANSETRON HCL 4 MG/2ML IJ SOLN
INTRAMUSCULAR | Status: DC | PRN
  Administered 2013-12-26: 4 mg via INTRAVENOUS

## 2013-12-26 MED ORDER — FENTANYL CITRATE 0.05 MG/ML IJ SOLN
INTRAMUSCULAR | Status: AC
  Filled 2013-12-26: qty 2

## 2013-12-26 MED ORDER — MIDAZOLAM HCL 10 MG/2ML IJ SOLN
INTRAMUSCULAR | Status: DC | PRN
  Administered 2013-12-26 (×2): 2 mg via INTRAVENOUS

## 2013-12-26 MED ORDER — ONDANSETRON HCL 4 MG/2ML IJ SOLN
INTRAMUSCULAR | Status: AC
  Filled 2013-12-26: qty 2

## 2013-12-26 NOTE — Progress Notes (Signed)
Utilization review completed.  

## 2013-12-26 NOTE — Anesthesia Preprocedure Evaluation (Addendum)
Anesthesia Evaluation  Patient identified by MRN, date of birth, ID band Patient awake    Reviewed: Allergy & Precautions, H&P , NPO status , Patient's Chart, lab work & pertinent test results, reviewed documented beta blocker date and time   History of Anesthesia Complications (+) PROLONGED EMERGENCE and history of anesthetic complications  Airway Mallampati: II TM Distance: >3 FB Neck ROM: Full    Dental   Pulmonary sleep apnea ,  breath sounds clear to auscultation        Cardiovascular hypertension, Pt. on medications and Pt. on home beta blockers + dysrhythmias Atrial Fibrillation + Valvular Problems/Murmurs AS Rhythm:Irregular Rate:Normal     Neuro/Psych    GI/Hepatic   Endo/Other    Renal/GU Renal InsufficiencyRenal diseaseCreatinine 2.07     Musculoskeletal   Abdominal   Peds  Hematology   Anesthesia Other Findings   Reproductive/Obstetrics                          Anesthesia Physical Anesthesia Plan  ASA: III  Anesthesia Plan: General   Post-op Pain Management:    Induction: Intravenous  Airway Management Planned: Mask  Additional Equipment:   Intra-op Plan:   Post-operative Plan:   Informed Consent: I have reviewed the patients History and Physical, chart, labs and discussed the procedure including the risks, benefits and alternatives for the proposed anesthesia with the patient or authorized representative who has indicated his/her understanding and acceptance.   Dental advisory given  Plan Discussed with:   Anesthesia Plan Comments:         Anesthesia Quick Evaluation

## 2013-12-26 NOTE — Progress Notes (Signed)
ANTICOAGULATION CONSULT NOTE - Follow Up Consult  Pharmacy Consult for heparin Indication: atrial fibrillation  Allergies  Allergen Reactions  . Ceftriaxone Sodium Hives  . Penicillins Hives  . Contrast Media [Iodinated Diagnostic Agents]     Poor kidney function  . Levaquin [Levofloxacin]   . Nsaids     Patient Measurements: Height: 5\' 6"  (167.6 cm) Weight: 200 lb 4.4 oz (90.844 kg) IBW/kg (Calculated) : 63.8 Heparin Dosing Weight: 83.1 kg  Vital Signs: Temp: 98.2 F (36.8 C) (03/26 1027) Temp src: Oral (03/26 1027) BP: 122/62 mmHg (03/26 1027) Pulse Rate: 97 (03/26 1027)  Labs:  Recent Labs  12/25/13 0002 12/25/13 1455 12/25/13 2201 12/26/13 0450 12/26/13 0915  HGB  --  17.0  --   --  17.2*  HCT  --  46.7  --   --  48.0  PLT  --  153  --   --  177  HEPARINUNFRC 0.78*  --   --   --  0.78*  CREATININE  --  1.95*  --   --  2.07*  TROPONINI  --  <0.30 <0.30 <0.30  --     Estimated Creatinine Clearance: 32 ml/min (by C-G formula based on Cr of 2.07).   Medications:  Infusions:  . sodium chloride 20 mL/hr at 12/26/13 3419  . heparin 1,050 Units/hr (12/26/13 0630)    Assessment: 79 y/o male with weakness, irregular heart rhythm, and cold symptoms over the last few days who presented to his cardiologist office and was found to be in Afib. He has a history of GI bleeding while on chronic anticoagulation.  Heparin level this morning is supratherapeutic at 0.78 despite rate decrease from last night. No bleeding noted, CBC is stable. Plan is for TEE/DCCV today and transition to factor Xa inhibitor for at least 3 weeks.   Goal of Therapy:  Heparin level 0.3-0.7 units/ml Monitor platelets by anticoagulation protocol: Yes   Plan:  - Decrease heparin drip to 900 units/hr - 8 hr heparin level unless changed to factor Xa inhibitor - Daily heparin level and CBC - Monitor for s/sx of bleeding - Factor Xa inhibitor dose will need to be adjusted for renal  function  [facility], Pharm.D., BCPS Clinical Pharmacist Pager: [phone] 12/26/2013 10:35 AM

## 2013-12-26 NOTE — Progress Notes (Signed)
Patient Profile: 79 y/o male with h/o PAF, on low dose Amiodarone therapy, now in persistent symptomatic atrial fibrillation. Also with a PMH of GI bleeding.    Subjective: Only complaint is fatigue. Denies palpitations, SOB, CP, dizziness.   Objective: Vital signs in last 24 hours: Temp:  [97.7 F (36.5 C)-99 F (37.2 C)] 98.7 F (37.1 C) (03/26 0622) Pulse Rate:  [53-103] 90 (03/26 0622) Resp:  [18-20] 18 (03/26 0622) BP: (110-153)/(70-86) 126/72 mmHg (03/26 0622) SpO2:  [95 %-97 %] 95 % (03/26 0622) Weight:  [200 lb 4.4 oz (90.844 kg)-200 lb 8 oz (90.946 kg)] 200 lb 4.4 oz (90.844 kg) (03/26 0000) Last BM Date: 12/24/13  Intake/Output from previous day: 03/25 0701 - 03/26 0700 In: 244 [P.O.:244] Out: 1200 [Urine:1200] Intake/Output this shift:    Medications Current Facility-Administered Medications  Medication Dose Route Frequency Provider Last Rate Last Dose  . 0.9 %  sodium chloride infusion  250 mL Intravenous PRN Belva Crome III, MD      . 0.9 %  sodium chloride infusion   Intravenous Continuous Belva Crome III, MD 20 mL/hr at 12/26/13 [phone]    . amiodarone (PACERONE) tablet 200 mg  200 mg Oral BID Belva Crome III, MD   200 mg at 12/25/13 2220  . brimonidine (ALPHAGAN) 0.2 % ophthalmic solution 1 drop  1 drop Both Eyes Q12H Belva Crome III, MD   1 drop at 12/25/13 2218   And  . timolol (TIMOPTIC) 0.5 % ophthalmic solution 1 drop  1 drop Both Eyes BID Belva Crome III, MD   1 drop at 12/25/13 2218  . calcium-vitamin D (OSCAL WITH D) 500-200 MG-UNIT per tablet 1 tablet  1 tablet Oral Q breakfast [name], MD   1 tablet at 12/26/13 [phone]  . clobetasol cream (TEMOVATE) 0.05 %   Topical BID Belva Crome III, MD      . heparin ADULT infusion 100 units/mL (25000 units/250 mL)  1,050 Units/hr Intravenous Continuous Belva Crome III, MD 10.5 mL/hr at 12/26/13 0630 1,050 Units/hr at 12/26/13 0630  . latanoprost (XALATAN) 0.005 % ophthalmic solution 1 drop  1  drop Both Eyes QHS Belva Crome III, MD   1 drop at 12/25/13 2354  . metoprolol (LOPRESSOR) tablet 50 mg  50 mg Oral BID Belva Crome III, MD   50 mg at 12/25/13 2218  . sodium chloride 0.9 % injection 3 mL  3 mL Intravenous Q12H Belva Crome III, MD   3 mL at 12/25/13 2221  . sodium chloride 0.9 % injection 3 mL  3 mL Intravenous PRN [name], MD        PE: General appearance: alert, cooperative and no distress Lungs: clear to auscultation bilaterally Heart: irregularly irregular rhythm Extremities: no LEE Pulses: 2+ and symmetric Skin: Skin color, texture, turgor normal. No rashes or lesions or warm and dry Neurologic: Grossly normal  Lab Results:   Recent Labs  12/25/13 1455  WBC 9.2  HGB 17.0  HCT 46.7  PLT 153   BMET  Recent Labs  12/25/13 1455  NA 134*  K 4.3  CL 98  CO2 22  GLUCOSE 96  BUN 26*  CREATININE 1.95*  CALCIUM 9.2    Assessment/Plan  Active Problems:   Atrial fibrillation  Plan:  1. Atrial Fibrillation: Ventricular rate in the 110s this am. BP stable. Fairly asymptomatic, with the exception of constant fatigue. Plan is for  TEE/DCCV today at 1300 with Dr. Harrington Challenger. Continue on IV heparin, PO Amiodarone and Lopressor. The goal is to try to control his rhythm and therefore avoid the need for anticoagulation in this patient who has had significant GI bleeding when on chronic anticoagulation. However, post cardioversion, he will need to be on oral anticoagulation therapy with a factor X A. inhibitor for at least 3 weeks.    LOS: 1 day    Brittainy M. Ladoris Gene 12/26/2013 8:49 AM   I have personally seen and examined this patient with Lyda Jester, PA-C. I agree with the assessment and plan as outlined above. TEE/DCCV later today. Continue heparin today then likely start Eliquis in am.   Saachi Zale 12/26/2013 12:46 PM

## 2013-12-26 NOTE — Anesthesia Postprocedure Evaluation (Signed)
  Anesthesia Post-op Note  Patient: Thomas [name]  Procedure(s) Performed: Procedure(s): TRANSESOPHAGEAL ECHOCARDIOGRAM (TEE) (N/A) CARDIOVERSION (N/A)  Patient Location: PACU  Anesthesia Type:General  Level of Consciousness: awake and alert   Airway and Oxygen Therapy: Patient Spontanous Breathing  Post-op Pain: none  Post-op Assessment: Post-op Vital signs reviewed, Patient's Cardiovascular Status Stable and Respiratory Function Stable  Post-op Vital Signs: Reviewed  Filed Vitals:   12/26/13 1232  BP: 117/66  Pulse:   Temp:   Resp: 19    Complications: No apparent anesthesia complications

## 2013-12-26 NOTE — Progress Notes (Signed)
Echocardiogram Echocardiogram Transesophageal has been performed.  Joelene Millin 12/26/2013, 12:08 PM

## 2013-12-26 NOTE — Progress Notes (Signed)
ANTICOAGULATION CONSULT NOTE - Follow Up Consult  Pharmacy Consult for heparin Indication: atrial fibrillation  Allergies  Allergen Reactions  . Ceftriaxone Sodium Hives  . Penicillins Hives  . Contrast Media [Iodinated Diagnostic Agents]     Poor kidney function  . Levaquin [Levofloxacin]   . Nsaids     Patient Measurements: Height: 5\' 6"  (167.6 cm) Weight: 200 lb 4.4 oz (90.844 kg) IBW/kg (Calculated) : 63.8 Heparin Dosing Weight: 83.1 kg  Vital Signs: Temp: 98.2 F (36.8 C) (03/26 2044) Temp src: Oral (03/26 2044) BP: 116/60 mmHg (03/26 2044) Pulse Rate: 62 (03/26 2044)  Labs:  Recent Labs  12/25/13 0002 12/25/13 1455 12/25/13 2201 12/26/13 0450 12/26/13 0915 12/26/13 1934  HGB  --  17.0  --   --  17.2*  --   HCT  --  46.7  --   --  48.0  --   PLT  --  153  --   --  177  --   HEPARINUNFRC 0.78*  --   --   --  0.78* 0.46  CREATININE  --  1.95*  --   --  2.07*  --   TROPONINI  --  <0.30 <0.30 <0.30  --   --     Estimated Creatinine Clearance: 32 ml/min (by C-G formula based on Cr of 2.07).   Medications:  Infusions:  . heparin 900 Units/hr (12/26/13 1350)    Assessment: 79 y/o male on heparin for afib. S/p DCCV today - pt now in NSR. Heparin level therapeutic on 900 units/hr. No bleeding noted.  Goal of Therapy:  Heparin level 0.3-0.7 units/ml Monitor platelets by anticoagulation protocol: Yes   Plan:  - Continue heparin drip at 900 units/hr - Daily heparin level and CBC  Sherlon Handing, PharmD, BCPS Clinical pharmacist, pager [phone] 12/26/2013 9:05 PM

## 2013-12-26 NOTE — Interval H&P Note (Signed)
History and Physical Interval Note:  12/26/2013 11:16 AM  Thomas [name]  has presented today for surgery, with the diagnosis of A FIB   The various methods of treatment have been discussed with the patient and family. After consideration of risks, benefits and other options for treatment, the patient has consented to  Procedure(s): TRANSESOPHAGEAL ECHOCARDIOGRAM (TEE) (N/A) CARDIOVERSION (N/A) as a surgical intervention .  The patient's history has been reviewed, patient examined, no change in status, stable for surgery.  I have reviewed the patient's chart and labs.  Questions were answered to the patient's satisfaction.     [name]

## 2013-12-26 NOTE — Op Note (Signed)
Patient anesthetized with propofol per anesthesia With pads in AP position patient cardioverted to SR with 200 J synchronized biphasic energy. 12 lead EKG pending.

## 2013-12-26 NOTE — Progress Notes (Signed)
[facility] for heparin Indication: atrial fibrillation  Allergies  Allergen Reactions  . Ceftriaxone Sodium Hives  . Penicillins Hives  . Contrast Media [Iodinated Diagnostic Agents]     Poor kidney function  . Levaquin [Levofloxacin]   . Nsaids     Patient Measurements: Height: 5' 6"  (167.6 cm) Weight: 200 lb 4.4 oz (90.844 kg) IBW/kg (Calculated) : 63.8 Heparin Dosing Weight: 83.1 kg  Vital Signs: Temp: 98.6 F (37 C) (03/26 0000) Temp src: Oral (03/26 0000) BP: 124/77 mmHg (03/26 0000) Pulse Rate: 88 (03/26 0000)  Labs:  Recent Labs  12/25/13 0002 12/25/13 1455 12/25/13 2201  HGB  --  17.0  --   HCT  --  46.7  --   PLT  --  153  --   HEPARINUNFRC 0.78*  --   --   CREATININE  --  1.95*  --   TROPONINI  --  <0.30 <0.30    Estimated Creatinine Clearance: 34 ml/min (by C-G formula based on Cr of 1.95).   Medical History: Past Medical History  Diagnosis Date  . Hypertension   . Hypercholesterolemia   . Chronic kidney disease     Hx of bilateral renal cysts.  . History of atrial fibrillation 03/09/09  . Mild aortic stenosis   . Glaucoma   . BPH (benign prostatic hypertrophy)   . Smoldering multiple myeloma   . Prostate cancer 7/12    T1C Ad Ca  s/p TURP, observation only  . Hx of epiglottitis     2011  . Complication of anesthesia     DIFFICULT TO WAKE   . Dysrhythmia     ATRIAL FIBRILATION  . Heart murmur   . Sleep apnea   . Arthritis     LEGS    Medications:  Medication history not complete yet  Assessment: 79 y/o male with weakness, irregular heart rhythm, and cold symptoms over the last few days who presented to his cardiologist office and was found to be in Afib. He has a history of GI bleeding while on chronic anticoagulation. Pharmacy consulted to begin heparin. Last CBC was normal and SCr was 1.8 on 11/18/13.  Initial heparin level 0.78.  Goal of Therapy:  Heparin level 0.3-0.7 units/ml Monitor  platelets by anticoagulation protocol: Yes   Plan:  -Decrease heparin drip to1050 units/hr -6 hr heparin level -Daily heparin level and CBC -Monitor for s/sx of bleeding  Rhyan Wolters, Pharm.D Clinical Pharmacist Pager: [phone] 12/26/2013 12:52 AM   A

## 2013-12-26 NOTE — Transfer of Care (Signed)
Immediate Anesthesia Transfer of Care Note  Patient: Thomas [name]  Procedure(s) Performed: Procedure(s): TRANSESOPHAGEAL ECHOCARDIOGRAM (TEE) (N/A) CARDIOVERSION (N/A)  Patient Location: Endoscopy Unit  Anesthesia Type:General  Level of Consciousness: responds to stimulation  Airway & Oxygen Therapy: Patient Spontanous Breathing and Patient connected to nasal cannula oxygen  Post-op Assessment: Report given to PACU RN and Post -op Vital signs reviewed and stable  Post vital signs: Reviewed and stable  Complications: No apparent anesthesia complications

## 2013-12-26 NOTE — Op Note (Signed)
Limited exam due to patient's breathing LA, LAA without thrombus. LV function normal.

## 2013-12-26 NOTE — OR Nursing (Signed)
After spraying with cetacaine patient became nauseated and spit a lot of oral drainage.  4 MG iv ZOFRAN GIVEN. After 4 mg IV midalzolm and fentanyl patient began to desat to 89%.  Jaw trust preformed and and oxygen mask was placed.  TEE was preformed and cardioversion was done with anesthesia.

## 2013-12-27 ENCOUNTER — Encounter: Payer: Self-pay | Admitting: Interventional Cardiology

## 2013-12-27 ENCOUNTER — Encounter (HOSPITAL_COMMUNITY): Payer: Self-pay | Admitting: Internal Medicine

## 2013-12-27 DIAGNOSIS — G4733 Obstructive sleep apnea (adult) (pediatric): Secondary | ICD-10-CM

## 2013-12-27 DIAGNOSIS — I5032 Chronic diastolic (congestive) heart failure: Secondary | ICD-10-CM

## 2013-12-27 DIAGNOSIS — I509 Heart failure, unspecified: Secondary | ICD-10-CM

## 2013-12-27 HISTORY — DX: Chronic diastolic (congestive) heart failure: I50.32

## 2013-12-27 LAB — CBC
HCT: 40.5 % (ref 39.0–52.0)
Hemoglobin: 14.1 g/dL (ref 13.0–17.0)
MCH: 32.4 pg (ref 26.0–34.0)
MCHC: 34.8 g/dL (ref 30.0–36.0)
MCV: 93.1 fL (ref 78.0–100.0)
Platelets: 167 10*3/uL (ref 150–400)
RBC: 4.35 MIL/uL (ref 4.22–5.81)
RDW: 13.6 % (ref 11.5–15.5)
WBC: 8.4 10*3/uL (ref 4.0–10.5)

## 2013-12-27 LAB — HEPARIN LEVEL (UNFRACTIONATED): Heparin Unfractionated: 0.48 IU/mL (ref 0.30–0.70)

## 2013-12-27 MED ORDER — APIXABAN 5 MG PO TABS: 5.0000 mg | ORAL_TABLET | Freq: Two times a day (BID) | ORAL | Status: DC

## 2013-12-27 MED ORDER — APIXABAN 5 MG PO TABS
5.0000 mg | ORAL_TABLET | Freq: Two times a day (BID) | ORAL | Status: DC
  Administered 2013-12-27: 5 mg via ORAL
  Filled 2013-12-27 (×2): qty 1

## 2013-12-27 MED ORDER — AMIODARONE HCL 200 MG PO TABS: 200.0000 mg | ORAL_TABLET | Freq: Two times a day (BID) | ORAL | Status: DC

## 2013-12-27 NOTE — Clinical Documentation Improvement (Signed)
   Please clarify if the diagnosis of "Acute on Chronic Diastolic Heart Failure" is applicable to this admission.   If so, please include the diagnosis in the discharge summary and current hospital problem list, including if Present on Admission (POA)  Clinical Information:    - "Fatigue and dyspnea likely related to acute on chronic diastolic heart failure" documented in H&P   - Lasix 80 mg IV given x 1 this admission   - 1200 ml of urine output record within 12 hours of admission   Thank You,  Erling Conte ,RN BSN CCDS Certified Clinical Documentation Specialist [phone] [facility]

## 2013-12-27 NOTE — Progress Notes (Signed)
Pt discharged to home per MD order. Pt received and reviewed all discharge instructions and medication information including follow-up appointments and prescriptions. Pt verbalized understanding. Pt alert and oriented at discharge instructions with no complaints of pain. Pt IV and telemetry box removed prior to discharge. Pt escorted to private vehicle via wheelchair by guest services volunteer. Thomas [name]

## 2013-12-27 NOTE — Progress Notes (Signed)
ANTICOAGULATION CONSULT NOTE - Initial Consult  Pharmacy Consult for Apixaban Indication: atrial fibrillation  Allergies  Allergen Reactions  . Ceftriaxone Sodium Hives  . Penicillins Hives  . Contrast Media [Iodinated Diagnostic Agents]     Poor kidney function  . Levaquin [Levofloxacin]   . Nsaids     Patient Measurements: Height: 5' 6"  (167.6 cm) Weight: 205 lb 4 oz (93.1 kg) IBW/kg (Calculated) : 63.8  Vital Signs: Temp: 98.7 F (37.1 C) (03/27 0527) Temp src: Oral (03/27 0527) BP: 112/66 mmHg (03/27 0527) Pulse Rate: 60 (03/27 0527)  Labs:  Recent Labs  12/25/13 1455 12/25/13 2201 12/26/13 0450 12/26/13 0915 12/26/13 1934 12/27/13 0422  HGB 17.0  --   --  17.2*  --  14.1  HCT 46.7  --   --  48.0  --  40.5  PLT 153  --   --  177  --  167  HEPARINUNFRC  --   --   --  0.78* 0.46 0.48  CREATININE 1.95*  --   --  2.07*  --   --   TROPONINI <0.30 <0.30 <0.30  --   --   --     Estimated Creatinine Clearance: 32.4 ml/min (by C-G formula based on Cr of 2.07).   Medical History: Past Medical History  Diagnosis Date  . Hypertension   . Hypercholesterolemia   . Chronic kidney disease     Hx of bilateral renal cysts.  . History of atrial fibrillation 03/09/09  . Mild aortic stenosis   . Glaucoma   . BPH (benign prostatic hypertrophy)   . Smoldering multiple myeloma   . Prostate cancer 7/12    T1C Ad Ca  s/p TURP, observation only  . Hx of epiglottitis     2011  . Complication of anesthesia     DIFFICULT TO WAKE   . Dysrhythmia     ATRIAL FIBRILATION  . Heart murmur   . Sleep apnea   . Arthritis     LEGS    Medications:  Scheduled:  . amiodarone  200 mg Oral BID  . apixaban  5 mg Oral BID  . brimonidine  1 drop Both Eyes Q12H   And  . timolol  1 drop Both Eyes BID  . calcium-vitamin D  1 tablet Oral Q breakfast  . clobetasol cream   Topical BID  . latanoprost  1 drop Both Eyes QHS  . metoprolol  50 mg Oral BID  . sodium chloride  3 mL  Intravenous Q12H   Infusions:  . heparin 900 Units/hr (12/26/13 1350)    Assessment: 79 yo M on heparin for afib, s/p DCCV yesterday.  Heparin level this morning is therapeutic.  New orders to transition to Apixaban.  Pt only has one criteria for reduced dose (SCr >1.5).  Pt qualifies for 69m BID dose.    Goal of Therapy:  Therapeutic Anticoagulation   Plan:  Discontinue heparin at 1000.  Discontinue heparin levels. Start Apixaban 588mBID - first dose at 1000. Apixaban education.  Ki[employer] IncPharm.D., BCPS Clinical Pharmacist Pager [phone]/27/2015 8:27 AM

## 2013-12-27 NOTE — Discharge Summary (Signed)
Discharge Summary   Patient ID: Thomas [name] MRN: [id number], DOB/AGE: 77/27/1936 77 y.o. Admit date: 12/25/2013 D/C date:     12/27/2013  Primary Cardiologist: Dr. Tamala Julian  Active Problems:   Atrial fibrillation   Multiple myeloma   Hypertension   Hypercholesterolemia   Chronic kidney disease   Mild aortic stenosis   BPH (benign prostatic hypertrophy)   Paroxysmal atrial fibrillation   OSA (obstructive sleep apnea)   Acute on chronic diastolic CHF (congestive heart failure), NYHA class 1   Admission Dates: 12/25/13 - 12/27/13 Discharge Diagnosis: Atrial fibrillation s/p TEE/DCCV on 12/26/13 and now maintaining NSR.  HPI: Thomas [name] is a 79 y.o. male with a history of PAF on amiodarone (started 1.5 mo ago), HTN, HLD, chronic diastolic heart failure, CKD, prostate cancer, OSA, history of GI bleed on Coumadin and mild aortic stenosis who presented to [facility] on 12/25/13 with persistent symptomatic atrial fibrillation. The patient complained of weakness, irregular rapid heart rhythm as well as chills, cough, nasal congestion, and shortness of breath x 5days. He felt as though his atrial fibrillation has been present continuously since that time. He had been put on amiodarone 200 mg per day without a load approximately 6 weeks prior because of intermittent episodes of atrial fibrillation; however he had never had an episode that lasted this long. The plan was to try to control his rhythm and therefore avoid the need for anticoagulation in this patient who has had significant GI bleeding when on chronic anticoagulation.   Hospital course: He was put on IV heparin, PO Amiodarone 248m BID and Lopressor with plans for TEE/DCCV the next day. His SOB and fatigue was felt to be secondary to acute on chronic diastolic heart failure exacerbated by atrial fibrillation. His symptoms improved with one dose of IV Lasix 870m He underwent successful TEE/DCCV on 12/26/13 and is now maintaining NSR. TEE  revealed LAA without thrombus and normal LV function. Post cardioversion, he will need to be on oral anticoagulation therapy with a factor X A. inhibitor for at least 3 weeks. On the day of discharge his heparin was discontinued and he was given one dose of apixaban 84m59mPharmacy was consulted and recommended 84mg74mD for the next 3 weeks. He has had no signs or symptoms of bleeding and his H/H is stable at 14/40.   The patient has had an uncomplicated hospital course and is recovering well. He has been seen by Dr. SmitTamala Julianay and deemed stable for discharge home. All follow-up appointments have been scheduled. Discharge medications include amidarone 200mg84m, apixaban 84mg B48mfor at least 3 weeks and lopressor 50mg B29mHis aspirin was discontinued and can be resumed when felt safe as an outpatient.   Discharge Vitals: Blood pressure 133/67, pulse 67, temperature 98.7 F (37.1 C), temperature source Oral, resp. rate 18, height 5' 6"  (1.676 m), weight 205 lb 4 oz (93.1 kg), SpO2 95.00%.  Labs: Lab Results  Component Value Date   WBC 8.4 12/27/2013   HGB 14.1 12/27/2013   HCT 40.5 12/27/2013   MCV 93.1 12/27/2013   PLT 167 12/27/2013     Recent Labs Lab 12/25/13 1455 12/26/13 0915  NA 134* 137  K 4.3 4.0  CL 98 99  CO2 22 23  BUN 26* 30*  CREATININE 1.95* 2.07*  CALCIUM 9.2 9.2  PROT 8.0  --   BILITOT 0.7  --   ALKPHOS 63  --   ALT 29  --   AST  41*  --   GLUCOSE 96 97    Recent Labs  12/25/13 1455 12/25/13 2201 12/26/13 0450  TROPONINI <0.30 <0.30 <0.30    Diagnostic Studies/Procedures   Dg Chest 2 View  12/25/2013   CLINICAL DATA:  Shortness of breath  EXAM: CHEST  2 VIEW  COMPARISON:  12/06/2009  FINDINGS: Cardiac shadow is stable. The lungs are well aerated bilaterally. No focal infiltrate or sizable effusion is seen. Postoperative changes are noted in the upper abdomen.  IMPRESSION: No active cardiopulmonary disease.   Electronically Signed   By: Inez Catalina M.D.   On:  12/25/2013 20:46    Discharge Medications     Medication List    STOP taking these medications       aspirin EC 81 MG tablet     Travoprost (BAK Free) 0.004 % Soln ophthalmic solution  Commonly known as:  TRAVATAN      TAKE these medications       amiodarone 200 MG tablet  Commonly known as:  PACERONE  Take 1 tablet (200 mg total) by mouth 2 (two) times daily.     apixaban 5 MG Tabs tablet  Commonly known as:  ELIQUIS  Take 1 tablet (5 mg total) by mouth 2 (two) times daily.     COMBIGAN 0.2-0.5 % ophthalmic solution  Generic drug:  brimonidine-timolol  Place 1 drop into both eyes every 12 (twelve) hours.     KRILL OIL PO  Take 1 capsule by mouth daily.     metoprolol 50 MG tablet  Commonly known as:  LOPRESSOR  Take 50 mg by mouth 2 (two) times daily.     SM CALCIUM 600+D PLUS MINERALS 600-800 MG-UNIT Chew  Chew 1 tablet by mouth daily.        Disposition   The patient will be discharged in stable condition to home.  Future Appointments Provider Department Dept Phone   01/10/2014 8:00 AM Burtis Junes, NP [place] Office [phone]   03/24/2014 11:00 AM [facility] Only [facility] [phone]   03/31/2014 10:30 AM [facility]ing Provider [facility] [phone]     Follow-up Information   Follow up with Truitt Merle, NP On 01/10/2014. (8am )    Specialty:  Nurse Practitioner   Contact information:   [place] [place] [address] [phone]         Duration of Discharge Encounter: Greater than 30 minutes including physician and PA time.  SignedVertell Limber, Yina Riviere PA-C 12/27/2013, 11:35 AM

## 2013-12-27 NOTE — Care Management Note (Addendum)
    Page 1 of 1   12/27/2013     11:05:55 AM   CARE MANAGEMENT NOTE 12/27/2013  Patient:  Thomas [name], Thomas [name]   Account Number:  [id number]  Date Initiated:  12/27/2013  Documentation initiated by:  GRAVES-BIGELOW,Tiffanee Mcnee  Subjective/Objective Assessment:   Pt admitted for Persistent atrial fibrillation. Plan for home with eliquis. Pt uses [facility] in [place]. Pharmacy has some of the medication and can order.     Action/Plan:   CM has benefits check in process and will make pt aware of cost once completed. CM will provide pt with 30 day free card as well. MD please write Rx for 30 day free no refills and then the original with refills.   Anticipated DC Date:  12/27/2013   Anticipated DC Plan:  [place]  CM consult  Medication Assistance      Choice offered to / List presented to:             Status of service:  Completed, signed off Medicare Important Message given?   (If response is "NO", the following Medicare IM given date fields will be blank) Date Medicare IM given:   Date Additional Medicare IM given:    Discharge Disposition:  HOME/SELF CARE  Per UR Regulation:  Reviewed for med. necessity/level of care/duration of stay  If discussed at [place] of Stay Meetings, dates discussed:    Comments:  12-27-13 [address] [place] [phone] Co pay will be 45.00 and pt is aware.

## 2013-12-27 NOTE — Discharge Summary (Signed)
S/p cardioversion and still loading amiodarone. Will f/u in 7-10 days where amiodarone dose may be adjusted down and he will be assessed for recurrent GI bleeding on anticoagulation. Agree with note and plan to discharge.

## 2013-12-27 NOTE — Progress Notes (Signed)
Switched to Apixaban. Maintain amiodarone at 200 mg PO BID and setup OV with me in 7 -19 days. Okay to discharge today

## 2013-12-27 NOTE — Discharge Instructions (Signed)
Atrial Fibrillation  Atrial fibrillation is a type of irregular heart rhythm (arrhythmia). During atrial fibrillation, the upper chambers of the heart (atria) quiver continuously in a chaotic pattern. This causes an irregular and often rapid heart rate.   Atrial fibrillation is the result of the heart becoming overloaded with disorganized signals that tell it to beat. These signals are normally released one at a time by a part of the right atrium called the sinoatrial node. They then travel from the atria to the lower chambers of the heart (ventricles), causing the atria and ventricles to contract and pump blood as they pass. In atrial fibrillation, parts of the atria outside of the sinoatrial node also release these signals. This results in two problems. First, the atria receive so many signals that they do not have time to fully contract. Second, the ventricles, which can only receive one signal at a time, beat irregularly and out of rhythm with the atria.   There are three types of atrial fibrillation:    Paroxysmal Paroxysmal atrial fibrillation starts suddenly and stops on its own within a week.    Persistent Persistent atrial fibrillation lasts for more than a week. It may stop on its own or with treatment.    Permanent Permanent atrial fibrillation does not go away. Episodes of atrial fibrillation may lead to permanent atrial fibrillation.   Atrial fibrillation can prevent your heart from pumping blood normally. It increases your risk of stroke and can lead to heart failure.   CAUSES    Heart conditions, including a heart attack, heart failure, coronary artery disease, and heart valve conditions.    Inflammation of the sac that surrounds the heart (pericarditis).    Blockage of an artery in the lungs (pulmonary embolism).    Pneumonia or other infections.    Chronic lung disease.    Thyroid problems, especially if the thyroid is overactive (hyperthyroidism).    Caffeine, excessive alcohol  use, and use of some illegal drugs.    Use of some medications, including certain decongestants and diet pills.    Heart surgery.    Birth defects.   Sometimes, no cause can be found. When this happens, the atrial fibrillation is called lone atrial fibrillation. The risk of complications from atrial fibrillation increases if you have lone atrial fibrillation and you are age 60 years or older.  RISK FACTORS   Heart failure.   Coronary artery disease   Diabetes mellitus.    High blood pressure (hypertension).    Obesity.    Other arrhythmias.    Increased age.  SYMPTOMS    A feeling that your heart is beating rapidly or irregularly.    A feeling of discomfort or pain in your chest.    Shortness of breath.    Sudden lightheadedness or weakness.    Getting tired easily when exercising.    Urinating more often than normal (mainly when atrial fibrillation first begins).   In paroxysmal atrial fibrillation, symptoms may start and suddenly stop.  DIAGNOSIS   Your caregiver may be able to detect atrial fibrillation when taking your pulse. Usually, testing is needed to diagnosis atrial fibrillation. Tests may include:    Electrocardiography. During this test, the electrical impulses of your heart are recorded while you are lying down.    Echocardiography. During echocardiography, sound waves are used to evaluate how blood flows through your heart.    Stress test. There is more than one type of stress test. If a stress test is   needed, ask your caregiver about which type is best for you.    Chest X-ray exam.    Blood tests.    Computed tomography (CT).   TREATMENT    Treating any underlying conditions. For example, if you have an overactive thyroid, treating the condition may correct atrial fibrillation.    Medication. Medications may be given to control a rapid heart rate or to prevent blood clots, heart failure, or a stroke.    Procedure to correct the rhythm of the  heart:   Electrical cardioversion. During electrical cardioversion, a controlled, low-energy shock is delivered to the heart through your skin. If you have chest pain, very low pressure blood pressure, or sudden heart failure, this procedure may need to be done as an emergency.   Catheter ablation. During this procedure, heart tissues that send the signals that cause atrial fibrillation are destroyed.   Maze or minimaze procedure. During this surgery, thin lines of heart tissue that carry the abnormal signals are destroyed. The maze procedure is an open-heart surgery. The minimaze procedure is a minimally invasive surgery. This means that small cuts are made to access the heart instead of a large opening.   Pulmonary venous isolation. During this surgery, tissue around the veins that carry blood from the lungs (pulmonary veins) is destroyed. This tissue is thought to carry the abnormal signals.  HOME CARE INSTRUCTIONS    Take medications as directed by your caregiver.   Only take medications that your caregiver approves. Some medications can make atrial fibrillation worse or recur.   If blood thinners were prescribed by your caregiver, take them exactly as directed. Too much can cause bleeding. Too little and you will not have the needed protection against stroke and other problems.   Perform blood tests at home if directed by your caregiver.   Perform blood tests exactly as directed.    Quit smoking if you smoke.    Do not drink alcohol.    Do not drink caffeinated beverages such as coffee, soda, and some teas. You may drink decaffeinated coffee, soda, or tea.    Maintain a healthy weight. Do not use diet pills unless your caregiver approves. They may make heart problems worse.    Follow diet instructions as directed by your caregiver.    Exercise regularly as directed by your caregiver.    Keep all follow-up appointments.  PREVENTION   The following substances can cause atrial fibrillation  to recur:    Caffeinated beverages.    Alcohol.    Certain medications, especially those used for breathing problems.    Certain herbs and herbal medications, such as those containing ephedra or ginseng.   Illegal drugs such as cocaine and amphetamines.  Sometimes medications are given to prevent atrial fibrillation from recurring. Proper treatment of any underlying condition is also important in helping prevent recurrence.   SEEK MEDICAL CARE IF:   You notice a change in the rate, rhythm, or strength of your heartbeat.    You suddenly begin urinating more frequently.    You tire more easily when exerting yourself or exercising.   SEEK IMMEDIATE MEDICAL CARE IF:    You develop chest pain, abdominal pain, sweating, or weakness.   You feel sick to your stomach (nauseous).   You develop shortness of breath.   You suddenly develop swollen feet and ankles.   You feel dizzy.   You face or limbs feel numb or weak.   There is a change in your   vision or speech.  MAKE SURE YOU:    Understand these instructions.   Will watch your condition.   Will get help right away if you are not doing well or get worse.  Document Released: 09/19/2005 Document Revised: 01/14/2013 Document Reviewed: 10/30/2012  [employer] Patient Information 2014 [employer].

## 2013-12-30 ENCOUNTER — Ambulatory Visit: Payer: Medicare Other | Admitting: Physician Assistant

## 2013-12-30 ENCOUNTER — Telehealth: Payer: Self-pay | Admitting: Interventional Cardiology

## 2013-12-30 DIAGNOSIS — G4730 Sleep apnea, unspecified: Secondary | ICD-10-CM

## 2013-12-30 NOTE — Telephone Encounter (Signed)
Patient is ready to schedule 2nd sleep study, Please call and advise.

## 2013-12-31 NOTE — Telephone Encounter (Signed)
returned pt call.pt adv that ref for slep study will be sent in. a scheduler will call him with the time,date, and location.pt sts that he believes he was back in afib last night for just a couple of hours and converted back to nsr.pt is post tee dccv on 12/25/13.adv pt I will fwd a message to Dr.Smith to make him aware.pt agreeable with plan and verbalized understanding

## 2014-01-01 NOTE — Telephone Encounter (Signed)
No change in therapy needed.

## 2014-01-10 ENCOUNTER — Ambulatory Visit (INDEPENDENT_AMBULATORY_CARE_PROVIDER_SITE_OTHER): Payer: Medicare Other | Admitting: Nurse Practitioner

## 2014-01-10 ENCOUNTER — Encounter: Payer: Self-pay | Admitting: Nurse Practitioner

## 2014-01-10 VITALS — BP 150/90 | HR 53 | Ht 66.0 in | Wt 194.8 lb

## 2014-01-10 DIAGNOSIS — G4730 Sleep apnea, unspecified: Secondary | ICD-10-CM

## 2014-01-10 DIAGNOSIS — I4891 Unspecified atrial fibrillation: Secondary | ICD-10-CM

## 2014-01-10 DIAGNOSIS — Z79899 Other long term (current) drug therapy: Secondary | ICD-10-CM

## 2014-01-10 LAB — CBC
HCT: 45.6 % (ref 39.0–52.0)
Hemoglobin: 15.1 g/dL (ref 13.0–17.0)
MCHC: 33.2 g/dL (ref 30.0–36.0)
MCV: 96.3 fl (ref 78.0–100.0)
Platelets: 346 10*3/uL (ref 150.0–400.0)
RBC: 4.73 Mil/uL (ref 4.22–5.81)
RDW: 13.7 % (ref 11.5–14.6)
WBC: 7.9 10*3/uL (ref 4.5–10.5)

## 2014-01-10 LAB — TSH: TSH: 4.34 u[IU]/mL (ref 0.35–5.50)

## 2014-01-10 LAB — HEPATIC FUNCTION PANEL
ALT: 25 U/L (ref 0–53)
AST: 25 U/L (ref 0–37)
Albumin: 3.6 g/dL (ref 3.5–5.2)
Alkaline Phosphatase: 69 U/L (ref 39–117)
Bilirubin, Direct: 0.1 mg/dL (ref 0.0–0.3)
Total Bilirubin: 1 mg/dL (ref 0.3–1.2)
Total Protein: 8.1 g/dL (ref 6.0–8.3)

## 2014-01-10 LAB — BASIC METABOLIC PANEL
BUN: 21 mg/dL (ref 6–23)
CO2: 28 mEq/L (ref 19–32)
Calcium: 10 mg/dL (ref 8.4–10.5)
Chloride: 103 mEq/L (ref 96–112)
Creatinine, Ser: 2 mg/dL — ABNORMAL HIGH (ref 0.4–1.5)
GFR: 33.83 mL/min — ABNORMAL LOW (ref >60.00)
Glucose, Bld: 87 mg/dL (ref 70–99)
Potassium: 4.4 mEq/L (ref 3.5–5.1)
Sodium: 136 mEq/L (ref 135–145)

## 2014-01-10 NOTE — Patient Instructions (Addendum)
Continue on your current medicines  When you finish your Eliquis - start back on baby aspirin  Schedule the split night study  Monitor your pulse at home. Let us know if you are having recurrent atrial fib  We will recheck labs today  See Dr. Tamala Julian in 6 weeks  Call the [place] office at [phone] if you have any questions, problems or concerns.

## 2014-01-10 NOTE — Progress Notes (Signed)
Leitha Schuller Rylander Date of Birth: 12/25/1934 Medical Record #[id number]  History of Present Illness: Mr. Hain is seen back today for a post hospital visit. Seen for Dr. Tamala Julian. He is a 79 year old male with PAF on amiodarone, HTN, HLD, chronic diastolic HF, CKD, prostate cancer, OSA, prior GI bleed on coumadin, and mild AS. CHADSVASC looks to be at least a 4 (HTN, age, HF).   Presented to the [facility] ER on 12/25/13 with symptomatic atrial fib. He had been started about 6 weeks prior on amiodarone but was not loaded. Plan was to try and control his rhythm so he could avoid anticoagulation given prior GI bleed history. In the hospital he was treated with a dose of IV lasix. Amiodarone was increased. Underwent successful TEE/DCCV. Put on anticoagulation for 3 weeks only with Eliquis.  Comes in today. Here with wife and daughter. He is doing well. Notes that he did have atrial fib for the first week he was home. Notes that when he is out, he gets tired. No palpitations. No chest pain. Not short of breath. Anxious to get off the Eliquis. No bleeding noted.   Current Outpatient Prescriptions  Medication Sig Dispense Refill  . amiodarone (PACERONE) 200 MG tablet Take 1 tablet (200 mg total) by mouth 2 (two) times daily.  60 tablet  0  . apixaban (ELIQUIS) 5 MG TABS tablet Take 1 tablet (5 mg total) by mouth 2 (two) times daily.  60 tablet  0  . brimonidine-timolol (COMBIGAN) 0.2-0.5 % ophthalmic solution Place 1 drop into both eyes every 12 (twelve) hours.        . Calcium Carbonate-Vitamin D (CALCIUM-VITAMIN D) 500-200 MG-UNIT per tablet Take 1 tablet by mouth daily.      Marland Kitchen docusate sodium (COLACE) 100 MG capsule Take 100 mg by mouth daily.      Marland Kitchen KRILL OIL PO Take 1 capsule by mouth daily.       . metoprolol (LOPRESSOR) 50 MG tablet Take 50 mg by mouth 2 (two) times daily.        . travoprost, benzalkonium, (TRAVATAN) 0.004 % ophthalmic solution Place 1 drop into both eyes at bedtime.       No  current facility-administered medications for this visit.    Allergies  Allergen Reactions  . Ceftriaxone Sodium Hives  . Penicillins Hives  . Contrast Media [Iodinated Diagnostic Agents]     Poor kidney function  . Levaquin [Levofloxacin]   . Nsaids     Past Medical History  Diagnosis Date  . Hypertension   . Hypercholesterolemia   . Chronic kidney disease     Hx of bilateral renal cysts.  . History of atrial fibrillation 03/09/09  . Mild aortic stenosis   . Glaucoma   . BPH (benign prostatic hypertrophy)   . Smoldering multiple myeloma   . Prostate cancer 7/12    T1C Ad Ca  s/p TURP, observation only  . Hx of epiglottitis     2011  . Heart murmur   . Sleep apnea     Past Surgical History  Procedure Laterality Date  . Adrenalectomy  02/12/11    Left, for Pheochromocytoma  . Cholecystectomy  02/12/11  . Gastrectomy  1984    S/P gastrectomy for ulcer  . Knee surgery  1971    S/P Right knee surgery  . Transurethral resection of prostate  7/12  . Tee without cardioversion N/A 12/26/2013    Procedure: TRANSESOPHAGEAL ECHOCARDIOGRAM (TEE);  Surgeon: Carmin Muskrat  Harrington Challenger, MD;  Location: [facility];  Service: Cardiovascular;  Laterality: N/A;  . Cardioversion N/A 12/26/2013    Procedure: CARDIOVERSION;  Surgeon: [name], MD;  Location: [facility] ENDOSCOPY;  Service: Cardiovascular;  Laterality: N/A;    History  Smoking status  . Never Smoker   Smokeless tobacco  . Never Used    History  Alcohol Use No    Family History  Problem Relation Age of Onset  . Prostate cancer Father   . Melanoma Brother   . Prostate cancer Brother     Review of Systems: The review of systems is per the HPI.  All other systems were reviewed and are negative.  Physical Exam: BP 150/90  Pulse 53  Ht 5' 6"  (1.676 m)  Wt 194 lb 12.8 oz (88.361 kg)  BMI 31.46 kg/m2 Patient is very pleasant and in no acute distress. He is obese. Skin is warm and dry. Color is normal.  HEENT is unremarkable.  Normocephalic/atraumatic. PERRL. Sclera are nonicteric. Neck is supple. No masses. No JVD. Lungs are clear. Cardiac exam shows a regular rate and rhythm. Harsh outflow murmur. Abdomen is soft. Extremities are without edema. Gait and ROM are intact. No gross neurologic deficits noted.  LABORATORY DATA: EKG today shows sinus brady, rate of 53 with 1st degree AV block.  Lab Results  Component Value Date   WBC 8.4 12/27/2013   HGB 14.1 12/27/2013   HCT 40.5 12/27/2013   PLT 167 12/27/2013   GLUCOSE 97 12/26/2013   ALT 29 12/25/2013   AST 41* 12/25/2013   NA 137 12/26/2013   K 4.0 12/26/2013   CL 99 12/26/2013   CREATININE 2.07* 12/26/2013   BUN 30* 12/26/2013   CO2 23 12/26/2013   TSH 2.329 12/25/2013   INR 1.01 02/19/2010   HGBA1C 5.4 12/25/2013   TEE Impressions:  - No cardiac source of emboli was indentified. Transesophageal echocardiography with cardioversion. 2D and intravenous contrast injection. Height: Height: 167.6cm. Height: 66in. Weight: Weight: 90.9kg. Weight: 200lb. Body mass index: BMI: 32.3kg/m^2. Body surface area: BSA: 2.23m2. Blood pressure: 129/94. Patient status: Inpatient. Location: Endoscopy.  ------------------------------------------------------------  ------------------------------------------------------------ Left ventricle: Overall normal LV systolic function.  ------------------------------------------------------------ Left atrium: No evidence of thrombus in the atrial cavity or appendage.  ------------------------------------------------------------ Prepared and Electronically Authenticated by  RDorris Carnes2015-03-26T18:26:[id number]   Patient anesthetized with propofol per anesthesia  With pads in AP position patient cardioverted to SR with 200 J synchronized biphasic energy.  12 lead EKG pending.   Assessment / Plan: 1. PAF - on amiodarone - remains in sinus but has had breakthrough reported right after discharge - will leave him on his current regimen  for now. Consider cutting back on return visit. Cautioned about sun exposure. Recheck his labs today.   2. Prior GI bleed on anticoagulation - will finish his current supply of Eliquis - then go back to baby aspirin - has already gotten notice from his insurance company that they will NOT cover.   3. HTN - needs to monitor.  4. Diastolic HF - looks compensated.   5. OSA - was to have had split night study - I have released the order today.   See him back in 6 weeks with EKG.   Patient is agreeable to this plan and will call if any problems develop in the interim.   LBurtis Junes RN, AManor1198 Meadowbrook CourtSRodmanGPackwood [place]  [phone]

## 2014-01-23 ENCOUNTER — Other Ambulatory Visit (HOSPITAL_COMMUNITY): Payer: Self-pay | Admitting: Physician Assistant

## 2014-02-17 ENCOUNTER — Ambulatory Visit (INDEPENDENT_AMBULATORY_CARE_PROVIDER_SITE_OTHER): Payer: Medicare Other | Admitting: Interventional Cardiology

## 2014-02-17 ENCOUNTER — Encounter: Payer: Self-pay | Admitting: Interventional Cardiology

## 2014-02-17 VITALS — BP 166/72 | HR 49 | Ht 66.0 in | Wt 197.0 lb

## 2014-02-17 DIAGNOSIS — I509 Heart failure, unspecified: Secondary | ICD-10-CM

## 2014-02-17 DIAGNOSIS — Z79899 Other long term (current) drug therapy: Secondary | ICD-10-CM

## 2014-02-17 DIAGNOSIS — I4891 Unspecified atrial fibrillation: Secondary | ICD-10-CM

## 2014-02-17 DIAGNOSIS — I10 Essential (primary) hypertension: Secondary | ICD-10-CM

## 2014-02-17 DIAGNOSIS — I5033 Acute on chronic diastolic (congestive) heart failure: Secondary | ICD-10-CM

## 2014-02-17 MED ORDER — AMIODARONE HCL 200 MG PO TABS: 200.0000 mg | ORAL_TABLET | Freq: Every day | ORAL | Status: DC

## 2014-02-17 NOTE — Patient Instructions (Signed)
Your physician has recommended you make the following change in your medication:  1) DECREASE Amiodarone to 200mg  daily  Take all other medications as prescribed  You have a follow up appt scheduled on 05/12/14 @9 :30am

## 2014-02-17 NOTE — Progress Notes (Signed)
Patient ID: Thomas [name], male   DOB: 1935-08-30, 79 y.o.   MRN: [id number]    [address], [address] [place], [place]  [address] Phone: [phone] Fax:  [phone]  Date:  02/17/2014   ID:  Thomas [name], DOB May 04, 1935, MRN [id number]  PCP:  [name], MD   ASSESSMENT:  1. Atrial fibrillation, now in sinus rhythm after cardioversion 2. Amiodarone therapy, with a rhythm control 3. History of bleeding on anticoagulation therapy 4. Acute on chronic diastolic heart failure, resolved after restoration of sinus rhythm  PLAN:  1. decrease amiodarone to 200 mg daily 2. Clinical followup in 3 months 3. Call if fatigue, dyspnea, edema, etc. 4. Followup on pending sleep study results    SUBJECTIVE: Thomas [name] is a 79 y.o. male who is doing well. He denies angina. He has not had syncope. He does note slow heart rate. He has started wearing his CPAP mask. A recent sleep study done approximately 10 days ago has not yet been resulted. Swelling and edema have resolved. He is now off of anticoagulation because of a history of GI bleeding or more than one occasion when on chronic anticoagulation therapy .   Wt Readings from Last 3 Encounters:  02/17/14 197 lb (89.359 kg)  01/10/14 194 lb 12.8 oz (88.361 kg)  12/27/13 205 lb 4 oz (93.1 kg)     Past Medical History  Diagnosis Date  . Hypertension   . Hypercholesterolemia   . Chronic kidney disease     Hx of bilateral renal cysts.  . History of atrial fibrillation 03/09/09  . Mild aortic stenosis   . Glaucoma   . BPH (benign prostatic hypertrophy)   . Smoldering multiple myeloma   . Prostate cancer 7/12    T1C Ad Ca  s/p TURP, observation only  . Hx of epiglottitis     2011  . Heart murmur   . Sleep apnea     Current Outpatient Prescriptions  Medication Sig Dispense Refill  . amiodarone (PACERONE) 200 MG tablet TAKE 1 TABLET BY MOUTH TWICE A DAY  60 tablet  0  . brimonidine-timolol (COMBIGAN) 0.2-0.5 %  ophthalmic solution Place 1 drop into both eyes every 12 (twelve) hours.        . Calcium Carbonate-Vitamin D (CALCIUM-VITAMIN D) 500-200 MG-UNIT per tablet Take 1 tablet by mouth daily.      Marland Kitchen docusate sodium (COLACE) 100 MG capsule Take 100 mg by mouth daily.      Marland Kitchen KRILL OIL PO Take 1 capsule by mouth daily.       . metoprolol (LOPRESSOR) 50 MG tablet Take 50 mg by mouth 2 (two) times daily.        . travoprost, benzalkonium, (TRAVATAN) 0.004 % ophthalmic solution Place 1 drop into both eyes at bedtime.       No current facility-administered medications for this visit.    Allergies:    Allergies  Allergen Reactions  . Ceftriaxone Sodium Hives  . Penicillins Hives  . Contrast Media [Iodinated Diagnostic Agents]     Poor kidney function  . Levaquin [Levofloxacin]   . Nsaids     Social History:  The patient  reports that he has never smoked. He has never used smokeless tobacco. He reports that he does not drink alcohol or use illicit drugs.   ROS:  Please see the history of present illness.   Denies neurological complaints. Has not had syncope.   All other systems reviewed  and negative.   OBJECTIVE: VS:  Ht 5' 6"  (1.676 m)  Wt 197 lb (89.359 kg)  BMI 31.81 kg/m2 Well nourished, well developed, in no acute distress, obese losing weight HEENT: normal Neck: JVD flat. Carotid bruit absent  Cardiac:  normal S1, S2; RRR, but bradycardic. No murmur Lungs:  clear to auscultation bilaterally, no wheezing, rhonchi or rales Abd: soft, nontender, no hepatomegaly Ext: Edema absent. Pulses 2+ Skin: warm and dry Neuro:  CNs 2-12 intact, no focal abnormalities noted  EKG:  Marked sinus bradycardia at 47 beats per minute on amiodarone 200 mg twice a day       Signed, Illene Labrador III, MD 02/17/2014 11:53 AM

## 2014-02-19 ENCOUNTER — Other Ambulatory Visit (HOSPITAL_COMMUNITY): Payer: Self-pay | Admitting: Interventional Cardiology

## 2014-03-24 ENCOUNTER — Other Ambulatory Visit (HOSPITAL_BASED_OUTPATIENT_CLINIC_OR_DEPARTMENT_OTHER): Payer: Medicare Other

## 2014-03-24 DIAGNOSIS — C9000 Multiple myeloma not having achieved remission: Secondary | ICD-10-CM

## 2014-03-24 DIAGNOSIS — D472 Monoclonal gammopathy: Secondary | ICD-10-CM

## 2014-03-24 LAB — COMPREHENSIVE METABOLIC PANEL (CC13)
ALT: 27 U/L (ref 0–55)
AST: 25 U/L (ref 5–34)
Albumin: 3.7 g/dL (ref 3.5–5.0)
Alkaline Phosphatase: 83 U/L (ref 40–150)
Anion Gap: 5 mEq/L (ref 3–11)
BUN: 19.6 mg/dL (ref 7.0–26.0)
CO2: 26 mEq/L (ref 22–29)
Calcium: 9.4 mg/dL (ref 8.4–10.4)
Chloride: 108 mEq/L (ref 98–109)
Creatinine: 1.9 mg/dL — ABNORMAL HIGH (ref 0.7–1.3)
Glucose: 115 mg/dl (ref 70–140)
Potassium: 4.2 mEq/L (ref 3.5–5.1)
Sodium: 139 mEq/L (ref 136–145)
Total Bilirubin: 0.67 mg/dL (ref 0.20–1.20)
Total Protein: 7.7 g/dL (ref 6.4–8.3)

## 2014-03-24 LAB — CBC WITH DIFFERENTIAL/PLATELET
BASO%: 0.3 % (ref 0.0–2.0)
Basophils Absolute: 0 10*3/uL (ref 0.0–0.1)
EOS%: 2.2 % (ref 0.0–7.0)
Eosinophils Absolute: 0.2 10*3/uL (ref 0.0–0.5)
HCT: 49.4 % (ref 38.4–49.9)
HGB: 16.4 g/dL (ref 13.0–17.1)
LYMPH%: 32.1 % (ref 14.0–49.0)
MCH: 32.2 pg (ref 27.2–33.4)
MCHC: 33.3 g/dL (ref 32.0–36.0)
MCV: 96.7 fL (ref 79.3–98.0)
MONO#: 0.7 10*3/uL (ref 0.1–0.9)
MONO%: 8.7 % (ref 0.0–14.0)
NEUT#: 4.4 10*3/uL (ref 1.5–6.5)
NEUT%: 56.7 % (ref 39.0–75.0)
Platelets: 215 10*3/uL (ref 140–400)
RBC: 5.1 10*6/uL (ref 4.20–5.82)
RDW: 15 % — ABNORMAL HIGH (ref 11.0–14.6)
WBC: 7.8 10*3/uL (ref 4.0–10.3)
lymph#: 2.5 10*3/uL (ref 0.9–3.3)

## 2014-03-26 LAB — IGG, IGA, IGM
IgA: 52 mg/dL — ABNORMAL LOW (ref 68–379)
IgG (Immunoglobin G), Serum: 2100 mg/dL — ABNORMAL HIGH (ref 650–1600)
IgM, Serum: 18 mg/dL — ABNORMAL LOW (ref 41–251)

## 2014-03-26 LAB — KAPPA/LAMBDA LIGHT CHAINS
Kappa free light chain: 88.8 mg/dL — ABNORMAL HIGH (ref 0.33–1.94)
Kappa:Lambda Ratio: 69.38 — ABNORMAL HIGH (ref 0.26–1.65)
Lambda Free Lght Chn: 1.28 mg/dL (ref 0.57–2.63)

## 2014-03-31 ENCOUNTER — Ambulatory Visit (HOSPITAL_BASED_OUTPATIENT_CLINIC_OR_DEPARTMENT_OTHER): Payer: Medicare Other | Admitting: Internal Medicine

## 2014-03-31 ENCOUNTER — Encounter: Payer: Self-pay | Admitting: Internal Medicine

## 2014-03-31 VITALS — BP 164/77 | HR 51 | Temp 97.5°F | Resp 18 | Ht 66.0 in | Wt 198.9 lb

## 2014-03-31 DIAGNOSIS — N189 Chronic kidney disease, unspecified: Secondary | ICD-10-CM

## 2014-03-31 DIAGNOSIS — C9000 Multiple myeloma not having achieved remission: Secondary | ICD-10-CM

## 2014-03-31 NOTE — Progress Notes (Signed)
Thomas [name] OFFICE PROGRESS NOTE  Thomas Schwalbe, MD [place], [address] [place] [address]  DIAGNOSIS: Smothering Multiple myeloma - Plan: CBC with Differential, Comprehensive metabolic panel (Cmet) - [facility], Lactate dehydrogenase (LDH) - [facility], Kappa/lambda light chains, IgG, IgA, IgM  Chief Complaint  Patient presents with  . Multiple Myeloma    CURRENT TREATMENT: Active surveillance.     Multiple myeloma   01/21/2010 Initial Diagnosis Smothering Multiple myeloma, IgG Kappa. (normal calcium, hemoglobin, negative skeletal survey, creatinine less than 2).    01/21/2010 Bone Marrow Biopsy 11 % plasma cells.    02/18/2010 Procedure Left [name] biopsy showed moderate to severe arterial nephrosclerosis with moderate to severe tubulointerstitial scarring. Immunofluorescence microsocopy showed monoclonal kappa chain stain along the tubular BM.   11/02/2012 Imaging Metastatic survey showed no acute changes from prior.   11/02/2012 Tumor Marker Serum kappa light chains was 60.8.  M protein on serum protein electrophoresis was 1.62 g/dL.    11/09/2012 Tumor Marker Beta 2 microglobulin was 2.58   03/21/2013 Tumor Marker IgG level was 2020, IgA level 51, IgM 16.   11/18/2013 Tumor Marker IgG 1900; Kappa light chains 62.5; M-spike, 1.45 g/dL.  Creatinine 1.8.   11/25/2013 Imaging Metastatic skeletal survey negative for lytic lesions. Faint areas of radiolucency in the frontal bones of the skull and mid-shaft of the left humerus felt to be stable.    INTERVAL HISTORY: Thomas [name] 79 y.o. male with a history of IgG Kappa Smothering MM is here for follow-up.  He was last seen by me on 11/25/2013.  He reports being admitted to [facility] on March 26th for a couple days for further evaluation of his heart arrhythmia and atrial fibrillation.  He had cardio conversion with a TTE to determine if clot was present (it was not per patient).  He was also started on amiodarone and  is now being followed with cardiology every 3 months. His last visit was in April. He denies chest pain or shortness of breath.     He is accompanied by [name].  He reports having a prostate biopsy about five months ago demonstrating stable disease.  He denies blood in his stools or melena.  He denies infections or bone pain.  Dr. Dema [name] added vitamin D supplementation on his recent visit.   MEDICAL HISTORY: Past Medical History  Diagnosis Date  . Hypertension   . Hypercholesterolemia   . Chronic [name] disease     Hx of bilateral renal cysts.  . History of atrial fibrillation 03/09/09  . Mild aortic stenosis   . Glaucoma   . BPH (benign prostatic hypertrophy)   . Smoldering multiple myeloma   . Prostate cancer 7/12    T1C Ad Ca  s/p TURP, observation only  . Hx of epiglottitis     2011  . Heart murmur   . Sleep apnea     INTERIM HISTORY: has Multiple myeloma; Renal insufficiency; Hypertension; Hypercholesterolemia; Chronic [name] disease; Mild aortic stenosis; BPH (benign prostatic hypertrophy); Melena; Paroxysmal atrial fibrillation; Encounter for long-term (current) use of other medications; Atrial fibrillation; OSA (obstructive sleep apnea); and Acute on chronic diastolic CHF (congestive heart failure), NYHA class 1 on his problem list.    ALLERGIES:  is allergic to benadryl; ceftriaxone sodium; penicillins; contrast media; levaquin; and nsaids.  MEDICATIONS: has a current medication list which includes the following prescription(s): amiodarone, aspirin, brimonidine-timolol, calcium-vitamin d, vitamin d3, docusate sodium, krill oil, metoprolol, and travoprost (benzalkonium).  SURGICAL HISTORY:  Past Surgical History  Procedure Laterality Date  . Adrenalectomy  02/12/11    Left, for Pheochromocytoma  . Cholecystectomy  02/12/11  . Gastrectomy  1984    S/P gastrectomy for ulcer  . Knee surgery  1971    S/P Right knee surgery  . Transurethral resection of prostate  7/12  .  Tee without cardioversion N/A 12/26/2013    Procedure: TRANSESOPHAGEAL ECHOCARDIOGRAM (TEE);  Surgeon: Thomas Records, MD;  Location: [facility] ENDOSCOPY;  Service: Cardiovascular;  Laterality: N/A;  . Cardioversion N/A 12/26/2013    Procedure: CARDIOVERSION;  Surgeon: Thomas Records, MD;  Location: [facility];  Service: Cardiovascular;  Laterality: N/A;   PROBLEM LISTS: 1. Hypertension.  2. History of recurrent atrial fibrillation.  3. Mild aortic stenosis.  4. Glaucoma.  5. Bilateral renal cysts.  6. 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 Rana [name].  7. History of hypercholesterolemia.  8. History of bleeding gastric ulcer in 1984, status post vagotomy and pyloroplasty.  9. Status post right knee surgery in 1971.  10. Status post left adrenalectomy for a pheochromocytoma by Dr. Raynelle [name] on 02/02/2011.  REVIEW OF SYSTEMS:   Constitutional: Denies fevers, chills or abnormal weight loss Eyes: Denies blurriness of vision Ears, nose, mouth, throat, and face: Denies mucositis or sore throat Respiratory: Denies cough, dyspnea or wheezes Cardiovascular: Denies palpitation, chest discomfort or lower extremity swelling Gastrointestinal:  Denies nausea, heartburn; changes in bowel habits as noted above Skin: Denies abnormal skin rashes Lymphatics: Denies new lymphadenopathy or easy bruising Neurological:Denies numbness, tingling or new weaknesses Behavioral/Psych: Mood is stable, no new changes  All other systems were reviewed with the patient and are negative.  PHYSICAL EXAMINATION: ECOG PERFORMANCE STATUS: 0 - Asymptomatic  Blood pressure 164/77, pulse 51, temperature 97.5 F (36.4 C), temperature source Oral, resp. rate 18, height 5' 6"  (1.676 m), weight 198 lb 14.4 oz (90.22 kg).  GENERAL:alert, no distress and comfortable; mildly obese. Elderly male.  SKIN: skin color, texture, turgor are normal, no rashes or significant  lesions EYES: normal, Conjunctiva are pink and non-injected, sclera clear OROPHARYNX:no exudate, no erythema and lips, buccal mucosa, and tongue normal  NECK: supple, thyroid normal size, non-tender, without nodularity LYMPH:  no palpable lymphadenopathy in the cervical, axillary or supraclavicular LUNGS: clear to auscultation with normal breathing effort, no wheezes or rhonchi HEART: regular rate & rhythm and soft SEM and no lower extremity edema ABDOMEN:abdomen soft, non-tender and normal bowel sounds Musculoskeletal:no cyanosis of digits and no clubbing  NEURO: alert & oriented x 3 with fluent speech, no focal motor/sensory deficits  Labs:  Lab Results  Component Value Date   WBC 7.8 03/24/2014   HGB 16.4 03/24/2014   HCT 49.4 03/24/2014   MCV 96.7 03/24/2014   PLT 215 03/24/2014   NEUTROABS 4.4 03/24/2014      Chemistry      Component Value Date/Time   NA 139 03/24/2014 1102   NA 136 01/10/2014 0841   K 4.2 03/24/2014 1102   K 4.4 01/10/2014 0841   CL 103 01/10/2014 0841   CL 109* 03/21/2013 0958   CO2 26 03/24/2014 1102   CO2 28 01/10/2014 0841   BUN 19.6 03/24/2014 1102   BUN 21 01/10/2014 0841   CREATININE 1.9* 03/24/2014 1102   CREATININE 2.0* 01/10/2014 0841      Component Value Date/Time   CALCIUM 9.4 03/24/2014 1102   CALCIUM 10.0 01/10/2014 0841  ALKPHOS 83 03/24/2014 1102   ALKPHOS 69 01/10/2014 0841   AST 25 03/24/2014 1102   AST 25 01/10/2014 0841   ALT 27 03/24/2014 1102   ALT 25 01/10/2014 0841   BILITOT 0.67 03/24/2014 1102   BILITOT 1.0 01/10/2014 0841     Studies:  METASTATIC BONE SURVEY  COMPARISON: DG BONE SURVEY MET dated 11/02/2012; DG BONE SURVEY MET  dated 02/06/2012  FINDINGS:  No new discrete lytic lesions.  Faint areas of apparent lucency involving frontal bone of the skull  appears grossly unchanged. No new discrete calvarial lesions.  Subtle lucent lesion involving the midshaft of the left humerus is  grossly unchanged.  Mild (<25%) compression  deformities within 2 adjacent mid thoracic  vertebral bodies appears grossly unchanged. Lumbar and cervical  vertebral body heights are grossly preserved. Mild scoliotic  curvature of the thoracolumbar spine, grossly unchanged.  There is multilevel the within the cervical, thoracic and lumbar  spine, grossly unchanged. There is partial ossification of the  nuchal ligament.  Mild degenerate change of the pubic symphysis.  Multiple loose bodies are again seen within the medial aspect of the  right knee joint space.  Grossly unchanged borderline enlarged cardiac silhouette and  mediastinal contours with mild tortuosity and possible slight  ectasia of the thoracic aorta. Surgical clips overlie the expected  location of the gastroesophageal junction. Post cholecystectomy.  IMPRESSION:  1. No new discrete lytic lesions to suggest progression of multiple  myeloma.  2. Grossly unchanged ill-defined lucencies within the frontal  calvarium and left humeral diaphysis.    ASSESSMENT: Thomas [name] 79 y.o. male with a history of Multiple myeloma - Plan: CBC with Differential, Comprehensive metabolic panel (Cmet) - [facility], Lactate dehydrogenase (LDH) - [facility], Kappa/lambda light chains, IgG, IgA, IgM   PLAN:  Based on NCCN guidelines defining end organ damage with creatinine greater than 2 or a hemoglobin less than 10, he would be considered as smothering Multiple myeloma.   1. Smothering IgG kappa multiple myeloma  --He is doing well clinically. For now, we will continue surveillance every 3-4 months with labs including CBC, CMP, SPEP and kappa/lamba. We discussed that there is 10% chance per year for the first 5 years that his smothering MM would become active symptomatic multiple myeloma. Mr. Blue seems to have stable disease. There has been no evidence of progression or any symptoms. His renal function seems to be stable. Metastatic survey is as noted above.   2. Chronic [name] Disease.   --His creatinine is stable at 1.9 today.  3. Follow-up.  -- The patient will return in 4 months, at which time we will  check CBC, chemistries, quantitative immunoglobulins, and serum light chains.    All questions were answered. The patient knows to call the clinic with any problems, questions or concerns. We can certainly see the patient much sooner if necessary.  I spent 15 minutes counseling the patient face to face. The total time spent in the appointment was 25 minutes.    Jovonte Commins, MD 04/01/2014 5:34 AM

## 2014-04-01 ENCOUNTER — Telehealth: Payer: Self-pay | Admitting: Internal Medicine

## 2014-04-01 NOTE — Telephone Encounter (Signed)
, °

## 2014-05-12 ENCOUNTER — Ambulatory Visit (INDEPENDENT_AMBULATORY_CARE_PROVIDER_SITE_OTHER): Payer: Medicare Other | Admitting: Interventional Cardiology

## 2014-05-12 ENCOUNTER — Encounter: Payer: Self-pay | Admitting: Interventional Cardiology

## 2014-05-12 VITALS — BP 163/64 | HR 49 | Ht 66.0 in | Wt 194.4 lb

## 2014-05-12 DIAGNOSIS — I480 Paroxysmal atrial fibrillation: Secondary | ICD-10-CM

## 2014-05-12 DIAGNOSIS — R001 Bradycardia, unspecified: Secondary | ICD-10-CM

## 2014-05-12 DIAGNOSIS — I509 Heart failure, unspecified: Secondary | ICD-10-CM

## 2014-05-12 DIAGNOSIS — I10 Essential (primary) hypertension: Secondary | ICD-10-CM

## 2014-05-12 DIAGNOSIS — I4891 Unspecified atrial fibrillation: Secondary | ICD-10-CM

## 2014-05-12 DIAGNOSIS — I5033 Acute on chronic diastolic (congestive) heart failure: Secondary | ICD-10-CM

## 2014-05-12 DIAGNOSIS — Z79899 Other long term (current) drug therapy: Secondary | ICD-10-CM

## 2014-05-12 DIAGNOSIS — I498 Other specified cardiac arrhythmias: Secondary | ICD-10-CM

## 2014-05-12 MED ORDER — METOPROLOL TARTRATE 50 MG PO TABS: 25.0000 mg | ORAL_TABLET | Freq: Two times a day (BID) | ORAL | Status: DC

## 2014-05-12 NOTE — Progress Notes (Signed)
Patient ID: Thomas [name], male   DOB: 01-03-1935, 79 y.o.   MRN: [id number]    [address] [place],   [address] Phone: [phone] Fax:  [phone]  Date:  05/12/2014   ID:  Thomas [name], DOB 1935-07-07, MRN [id number]  PCP:  [name], MD   ASSESSMENT:  1. Paroxysmal atrial fibrillation, now in sinus/sinus bradycardia and asymptomatic 2. Hypertension, elevated in the office but controlled at home 3. Bradycardia with heart rates in the 40-50 range but without obvious symptoms 4. Acute on chronic diastolic heart failure, compensated after conversion of atrial fibrillation to normal sinus rhythm 4. Sleep apnea is now treated with dramatic improvement in quality of life  PLAN:  1. Decrease metoprolol to 25 mg twice a day 2. Clinical followup in 3 months 3. If blood pressures consistently run above 140/90 on home monitoring, all and amlodipine 2-1/2 or 5 mg per day 4. On return a TSH and hepatic panel will be done    SUBJECTIVE: Thomas [name] is a 79 y.o. male who is asymptomatic. He has not had syncope. Is concerned about heart rates that run in the 40s. Her pressures at home with his adequate. Sleep apnea is now being treated he says that this is changed his life . He denies orthopnea, PND, angina, palpitations. Is no peripheral edema. He is having no blood in his urine or stool. He denies neurological complaints.   Wt Readings from Last 3 Encounters:  05/12/14 194 lb 6.4 oz (88.179 kg)  03/31/14 198 lb 14.4 oz (90.22 kg)  02/17/14 197 lb (89.359 kg)     Past Medical History  Diagnosis Date  . Hypertension   . Hypercholesterolemia   . Chronic kidney disease     Hx of bilateral renal cysts.  . History of atrial fibrillation 03/09/09  . Mild aortic stenosis   . Glaucoma   . BPH (benign prostatic hypertrophy)   . Smoldering multiple myeloma   . Prostate cancer 7/12    T1C Ad Ca  s/p TURP, observation only  . Hx of epiglottitis    2011  . Heart murmur   . Sleep apnea     Current Outpatient Prescriptions  Medication Sig Dispense Refill  . amiodarone (PACERONE) 200 MG tablet Take 200 mg by mouth daily.       Marland Kitchen aspirin 81 MG tablet Take 81 mg by mouth daily.      . brimonidine-timolol (COMBIGAN) 0.2-0.5 % ophthalmic solution Place 1 drop into both eyes 2 (two) times daily.       . Calcium Carbonate-Vitamin D (CALCIUM-VITAMIN D) 500-200 MG-UNIT per tablet Take 1 tablet by mouth daily.      . Cholecalciferol (VITAMIN D3) 1000 UNITS CAPS Take 2 capsules by mouth daily.      Marland Kitchen docusate sodium (COLACE) 100 MG capsule Take 200 mg by mouth daily.       Marland Kitchen KRILL OIL PO Take 30 mg by mouth daily.       . metoprolol (LOPRESSOR) 50 MG tablet Take 50 mg by mouth 2 (two) times daily.       . travoprost, benzalkonium, (TRAVATAN) 0.004 % ophthalmic solution Place 1 drop into both eyes at bedtime.       No current facility-administered medications for this visit.    Allergies:    Allergies  Allergen Reactions  . Benadryl [Diphenhydramine Hcl]     Bad dreams -  Hallucinations.  . Ceftriaxone Sodium Hives  .  Penicillins Hives  . Contrast Media [Iodinated Diagnostic Agents]     Poor kidney function  . Levaquin [Levofloxacin]   . Nsaids     Social History:  The patient  reports that he has never smoked. He has never used smokeless tobacco. He reports that he does not drink alcohol or use illicit drugs.   ROS:  Please see the history of present illness.   No lower extremity edema. No episodes of syncope. He denies angina.   All other systems reviewed and negative.   OBJECTIVE: VS:  BP 163/64  Pulse 49  Ht 5' 6"  (1.676 m)  Wt 194 lb 6.4 oz (88.179 kg)  BMI 31.39 kg/m2 Well nourished, well developed, in no acute distress, obese HEENT: normal Neck: JVD flat. Carotid bruit absent  Cardiac:  normal S1, S2; RRR; no murmur Lungs:  clear to auscultation bilaterally, no wheezing, rhonchi or rales Abd: soft, nontender, no  hepatomegaly Ext: Edema absent. Pulses 2+ Skin: warm and dry Neuro:  CNs 2-12 intact, no focal abnormalities noted  EKG:  Not repeated       Signed, Illene Labrador III, MD 05/12/2014 9:58 AM

## 2014-05-12 NOTE — Patient Instructions (Signed)
Your physician has recommended you make the following change in your medication:  1) DECREASE Metoprolol to 25mg  twice daily.  Monitor your blood pressure at home. Call the office it your br readings are consistently over 140/90  Your physician recommends that you return for lab work in: 3 months ( Tsh, Hepatic)  You have a follow up appointment scheduled on 08/14/14 @ 9:45am

## 2014-06-25 ENCOUNTER — Ambulatory Visit (INDEPENDENT_AMBULATORY_CARE_PROVIDER_SITE_OTHER): Payer: Medicare Other | Admitting: Cardiovascular Disease

## 2014-06-25 VITALS — BP 158/83 | HR 67 | Ht 66.0 in | Wt 194.4 lb

## 2014-06-25 DIAGNOSIS — I480 Paroxysmal atrial fibrillation: Secondary | ICD-10-CM

## 2014-06-25 DIAGNOSIS — C9000 Multiple myeloma not having achieved remission: Secondary | ICD-10-CM

## 2014-06-25 DIAGNOSIS — I4891 Unspecified atrial fibrillation: Secondary | ICD-10-CM

## 2014-06-25 DIAGNOSIS — Z9989 Dependence on other enabling machines and devices: Secondary | ICD-10-CM

## 2014-06-25 DIAGNOSIS — I10 Essential (primary) hypertension: Secondary | ICD-10-CM

## 2014-06-25 DIAGNOSIS — G4733 Obstructive sleep apnea (adult) (pediatric): Secondary | ICD-10-CM

## 2014-06-25 NOTE — Patient Instructions (Signed)
Your physician recommends that you schedule a follow-up appointment as  needed with Dr. Kelly for sleep. 

## 2014-06-26 ENCOUNTER — Encounter: Payer: Self-pay | Admitting: Cardiovascular Disease

## 2014-06-26 DIAGNOSIS — G4733 Obstructive sleep apnea (adult) (pediatric): Secondary | ICD-10-CM

## 2014-06-26 DIAGNOSIS — Z9989 Dependence on other enabling machines and devices: Secondary | ICD-10-CM

## 2014-06-26 HISTORY — DX: Obstructive sleep apnea (adult) (pediatric): G47.33

## 2014-06-26 NOTE — Progress Notes (Signed)
Patient ID: Thomas [name], male   DOB: [birth year], 79 y.o.   MRN: [id number]     HPI: Thomas [name], is a 79 y.o. male who is followed by Dr. Tamala Julian for cardiology care.  He recently was found to have obstructive sleep apnea and now presents for sleep clinic evaluation following initiation of CPAP therapy.  [name] is a 79 year old male who has a history of paroxysmal atrial fibrillation and recently has been maintaining sinus rhythm with sinus bradycardia.  He has a history of hypertension, hyperlipidemia, documented bilateral renal cysts, and mild aortic valve stenosis, and multiple myeloma.  He was referred by Dr. Tamala Julian for a diagnostic polysomnogram.  Due to concerns for obstructive sleep apnea, particularly with his atrial fibrillation, history.  His diagnostic polysomnogram, which was done at the [facility], heart and sleep [facility], on 02/05/2014 reveals severe obstructive sleep apnea with an AHI of 45.7 per hour.  He had oxygen desaturation to 86% and there was evidence for moderate snoring.  There were frequent periodic limb movements with an index of 36 per hour with 20.3 per hour to arousal.  He subsequently was referred for a CPAP titration in a CPAP pressure of 9 cm was recommended.  Of note, there was significant improvement in his periodic leg movements with CPAP therapy.  He has been on CPAP therapy for the past several months.  Download from 05/24/2014 to 06/22/2014 shows excellent compliance with 100% days of usage.  He had 97% days with use greater than 4 hours and is meeting Medicare compliance standards.  He has an air since [employer] unit and is at a set pressure of 9 cm.  His AHI is now excellent at 1.2.  There is no leak.  He has a Special educational needs teacher, fullface mask (medium).  The incision is healing CPAP therapy, he feels markedly improved with reference to sleep.  Now, only getting up 1 time to go to the bathroom in comparison to at least 3 times previously.  Sleep is  restorative.  He has more energy.  He denies any hypersomnolence.  He denies significant daytime sleepiness; however, his Epworth Sleepiness Scale score was calculated today at 10.   Epworth Sleepiness Scale: Situation   Chance of Dozing/Sleeping (0 = never , 1 = slight chance , 2 = moderate chance , 3 = high chance )   sitting and reading 3   watching TV 1   sitting inactive in a public place 2   being a passenger in a motor vehicle for an hour or more 0   lying down in the afternoon 3   sitting and talking to someone 0   sitting quietly after lunch (no alcohol) 1   while stopped for a few minutes in traffic as the driver 0   Total Score  10    Past Medical History  Diagnosis Date  . Hypertension   . Hypercholesterolemia   . Chronic kidney disease     Hx of bilateral renal cysts.  . History of atrial fibrillation 03/09/09  . Mild aortic stenosis   . Glaucoma   . BPH (benign prostatic hypertrophy)   . Smoldering multiple myeloma   . Prostate cancer 7/12    T1C Ad Ca  s/p TURP, observation only  . Hx of epiglottitis     2011  . Heart murmur   . Sleep apnea     Past Surgical History  Procedure Laterality Date  . Adrenalectomy  02/12/11  Left, for Pheochromocytoma  . Cholecystectomy  02/12/11  . Gastrectomy  1984    S/P gastrectomy for ulcer  . Knee surgery  1971    S/P Right knee surgery  . Transurethral resection of prostate  7/12  . Tee without cardioversion N/A 12/26/2013    Procedure: TRANSESOPHAGEAL ECHOCARDIOGRAM (TEE);  Surgeon: [name], MD;  Location: [facility];  Service: Cardiovascular;  Laterality: N/A;  . Cardioversion N/A 12/26/2013    Procedure: CARDIOVERSION;  Surgeon: [name], MD;  Location: [facility] ENDOSCOPY;  Service: Cardiovascular;  Laterality: N/A;    Allergies  Allergen Reactions  . Benadryl [Diphenhydramine Hcl]     Bad dreams -  Hallucinations.  . Ceftriaxone Sodium Hives  . Penicillins Hives  . Contrast Media [Iodinated Diagnostic  Agents]     Poor kidney function  . Levaquin [Levofloxacin]   . Nsaids     Current Outpatient Prescriptions  Medication Sig Dispense Refill  . amiodarone (PACERONE) 200 MG tablet Take 200 mg by mouth daily.       Marland Kitchen aspirin 81 MG tablet Take 81 mg by mouth daily.      . brimonidine-timolol (COMBIGAN) 0.2-0.5 % ophthalmic solution Place 1 drop into both eyes 2 (two) times daily.       . Calcium Carbonate-Vitamin D (CALCIUM-VITAMIN D) 500-200 MG-UNIT per tablet Take 1 tablet by mouth daily.      . Cholecalciferol (VITAMIN D3) 1000 UNITS CAPS Take 2 capsules by mouth daily.      Marland Kitchen docusate sodium (COLACE) 100 MG capsule Take 200 mg by mouth daily.       Marland Kitchen KRILL OIL PO Take 30 mg by mouth daily.       . metoprolol (LOPRESSOR) 50 MG tablet Take 0.5 tablets (25 mg total) by mouth 2 (two) times daily.      . travoprost, benzalkonium, (TRAVATAN) 0.004 % ophthalmic solution Place 1 drop into both eyes at bedtime.       No current facility-administered medications for this visit.    History   Social History  . Marital Status: Married    Spouse Name: N/A    Number of Children: 3  . Years of Education: N/A   Occupational History  .     Social History Main Topics  . Smoking status: Never Smoker   . Smokeless tobacco: Never Used  . Alcohol Use: No  . Drug Use: No  . Sexual Activity: Not on file   Other Topics Concern  . Not on file   Social History Narrative  . No narrative on file     ROS General: Negative; No fevers, chills, or night sweats HEENT: Negative; No changes in vision or hearing, sinus congestion, difficulty swallowing Pulmonary: Negative; No cough, wheezing, shortness of breath, hemoptysis Cardiovascular: Positive for PAF No chest pain, presyncope, syncope, palpatations GI: Negative; No nausea, vomiting, diarrhea, or abdominal pain GU: Negative; No dysuria, hematuria, or difficulty voiding Musculoskeletal: Negative; no myalgias, joint pain, or weakness Hematologic:  Positive for multiple myeloma; no easy bruising, bleeding Endocrine: Negative; no heat/cold intolerance Neuro: Negative; no changes in balance, headaches Skin: Negative; No rashes or skin lesions Psychiatric: Negative; No behavioral problems, depression Sleep: Negative; No daytime sleepiness, hypersomnolence, bruxism, restless legs, hypnogognic hallucinations, no cataplexy   Physical Exam BP 158/83  Pulse 67  Ht _0  (1.676 m)  Wt 194 lb 6.4 oz (88.179 kg)  BMI 31.39 kg/m2  General: Alert, oriented, no distress.  Skin: normal turgor, no rashes HEENT:  Normocephalic, atraumatic. Pupils round and reactive; sclera anicteric; extraocular muscles intact; Fundi without hemorrhages or exudates. Nose without nasal septal hypertrophy Mouth/Parynx benign; Mallinpatti scale 3 Neck: No JVD, no carotid briuts Lungs: clear to ausculatation and percussion; no wheezing or rales  Chest wall: No tenderness to palpation Heart: RRR, s1 s2 normal  Abdomen: soft, nontender; no hepatosplenomehaly, BS+; abdominal aorta nontender and not dilated by palpation. Back: No CVA tenderness Pulses 2+ Extremities: no clubbinbg cyanosis or edema, Homan's sign negative  Neurologic: grossly nonfocal; cranial nerves intact. Psychological: Normal affect and mood.   LABS:  BMET    Component Value Date/Time   NA 139 03/24/2014 1102   NA 136 01/10/2014 0841   K 4.2 03/24/2014 1102   K 4.4 01/10/2014 0841   CL 103 01/10/2014 0841   CL 109* 03/21/2013 0958   CO2 26 03/24/2014 1102   CO2 28 01/10/2014 0841   GLUCOSE 115 03/24/2014 1102   GLUCOSE 87 01/10/2014 0841   GLUCOSE 142* 03/21/2013 0958   BUN 19.6 03/24/2014 1102   BUN 21 01/10/2014 0841   CREATININE 1.9* 03/24/2014 1102   CREATININE 2.0* 01/10/2014 0841   CALCIUM 9.4 03/24/2014 1102   CALCIUM 10.0 01/10/2014 0841   GFRNONAA 29* 12/26/2013 0915   GFRAA 34* 12/26/2013 0915     Hepatic Function Panel     Component Value Date/Time   PROT 7.7 03/24/2014 1102   PROT  8.1 01/10/2014 0841   ALBUMIN 3.7 03/24/2014 1102   ALBUMIN 3.6 01/10/2014 0841   AST 25 03/24/2014 1102   AST 25 01/10/2014 0841   ALT 27 03/24/2014 1102   ALT 25 01/10/2014 0841   ALKPHOS 83 03/24/2014 1102   ALKPHOS 69 01/10/2014 0841   BILITOT 0.67 03/24/2014 1102   BILITOT 1.0 01/10/2014 0841   BILIDIR 0.1 01/10/2014 0841     CBC    Component Value Date/Time   WBC 7.8 03/24/2014 1102   WBC 7.9 01/10/2014 0841   RBC 5.10 03/24/2014 1102   RBC 4.73 01/10/2014 0841   HGB 16.4 03/24/2014 1102   HGB 15.1 01/10/2014 0841   HCT 49.4 03/24/2014 1102   HCT 45.6 01/10/2014 0841   PLT 215 03/24/2014 1102   PLT 346.0 01/10/2014 0841   MCV 96.7 03/24/2014 1102   MCV 96.3 01/10/2014 0841   MCH 32.2 03/24/2014 1102   MCH 32.4 12/27/2013 0422   MCHC 33.3 03/24/2014 1102   MCHC 33.2 01/10/2014 0841   RDW 15.0* 03/24/2014 1102   RDW 13.7 01/10/2014 0841   LYMPHSABS 2.5 03/24/2014 1102   LYMPHSABS 2.6 12/25/2013 1455   MONOABS 0.7 03/24/2014 1102   MONOABS 1.1* 12/25/2013 1455   EOSABS 0.2 03/24/2014 1102   EOSABS 0.0 12/25/2013 1455   BASOSABS 0.0 03/24/2014 1102   BASOSABS 0.0 12/25/2013 1455     BNP    Component Value Date/Time   PROBNP 1249.0* 12/25/2013 1455    Lipid Panel  No results found for this basename: chol, trig, hdl, cholhdl, vldl, ldlcalc, ldldirect     RADIOLOGY: No results found.    ASSESSMENT AND PLAN: Mr. 0 range underwent his diagnostic sleep study, which confirmed severe obstructive sleep apnea with AHI of 45.7 per hour 1 during REM sleep.  This was 47.6 per hour.  He did have significant periodic leg movements on the baseline study, but these significantly improved with CPAP initiation.  From a subjective standpoint, he notes marked improvement in energy and he feels that his sleep is significantly restorative.  He denies any awareness of breakthrough snoring.  He is meeting Medicare compliance standards His Epworth Sleepiness Scale score is borderline elevated, but he denies any  significant episodes of daytime sleepiness.  I discussed with him the importance of continued CPAP therapy and discussed daily with him concerning increased risk of recurrent atrial fibrillation if his sleep apnea is left untreated.  His blood pressure today was normal when rechecked by me at 126/84 although initially it was elevated when it was taken immediately upon coming into the office by the nursing staff.  The patient will follow up with Dr. Pernell Dupre for his Cardiologic care.  Of the available if problems arise from a sleep perspective.     Troy Sine, MD, [facility]  06/26/2014 7:29 PM

## 2014-06-29 ENCOUNTER — Encounter: Payer: Self-pay | Admitting: *Deleted

## 2014-07-04 ENCOUNTER — Encounter: Payer: Self-pay | Admitting: Cardiovascular Disease

## 2014-07-24 ENCOUNTER — Other Ambulatory Visit (HOSPITAL_BASED_OUTPATIENT_CLINIC_OR_DEPARTMENT_OTHER): Payer: Medicare Other

## 2014-07-24 DIAGNOSIS — C9000 Multiple myeloma not having achieved remission: Secondary | ICD-10-CM

## 2014-07-24 LAB — CBC WITH DIFFERENTIAL/PLATELET
BASO%: 0 % (ref 0.0–2.0)
Basophils Absolute: 0 10*3/uL (ref 0.0–0.1)
EOS%: 2.7 % (ref 0.0–7.0)
Eosinophils Absolute: 0.2 10*3/uL (ref 0.0–0.5)
HCT: 45.6 % (ref 38.4–49.9)
HGB: 15.9 g/dL (ref 13.0–17.1)
LYMPH%: 34 % (ref 14.0–49.0)
MCH: 32.4 pg (ref 27.2–33.4)
MCHC: 34.9 g/dL (ref 32.0–36.0)
MCV: 93.1 fL (ref 79.3–98.0)
MONO#: 0.6 10*3/uL (ref 0.1–0.9)
MONO%: 9.6 % (ref 0.0–14.0)
NEUT#: 3.4 10*3/uL (ref 1.5–6.5)
NEUT%: 53.7 % (ref 39.0–75.0)
Platelets: 190 10*3/uL (ref 140–400)
RBC: 4.9 10*6/uL (ref 4.20–5.82)
RDW: 14.2 % (ref 11.0–14.6)
WBC: 6.3 10*3/uL (ref 4.0–10.3)
lymph#: 2.2 10*3/uL (ref 0.9–3.3)
nRBC: 0 % (ref 0–0)

## 2014-07-24 LAB — COMPREHENSIVE METABOLIC PANEL (CC13)
ALT: 30 U/L (ref 0–55)
AST: 27 U/L (ref 5–34)
Albumin: 3.7 g/dL (ref 3.5–5.0)
Alkaline Phosphatase: 85 U/L (ref 40–150)
Anion Gap: 5 mEq/L (ref 3–11)
BUN: 21 mg/dL (ref 7.0–26.0)
CO2: 26 mEq/L (ref 22–29)
Calcium: 9.8 mg/dL (ref 8.4–10.4)
Chloride: 106 mEq/L (ref 98–109)
Creatinine: 1.9 mg/dL — ABNORMAL HIGH (ref 0.7–1.3)
Glucose: 95 mg/dl (ref 70–140)
Potassium: 4.2 mEq/L (ref 3.5–5.1)
Sodium: 137 mEq/L (ref 136–145)
Total Bilirubin: 0.72 mg/dL (ref 0.20–1.20)
Total Protein: 7.6 g/dL (ref 6.4–8.3)

## 2014-07-24 LAB — LACTATE DEHYDROGENASE (CC13): LDH: 206 U/L (ref 125–245)

## 2014-07-25 LAB — IGG, IGA, IGM
IgA: 46 mg/dL — ABNORMAL LOW (ref 68–379)
IgG (Immunoglobin G), Serum: 1920 mg/dL — ABNORMAL HIGH (ref 650–1600)
IgM, Serum: 26 mg/dL — ABNORMAL LOW (ref 41–251)

## 2014-07-25 LAB — KAPPA/LAMBDA LIGHT CHAINS
Kappa free light chain: 86 mg/dL — ABNORMAL HIGH (ref 0.33–1.94)
Kappa:Lambda Ratio: 54.43 — ABNORMAL HIGH (ref 0.26–1.65)
Lambda Free Lght Chn: 1.58 mg/dL (ref 0.57–2.63)

## 2014-07-31 ENCOUNTER — Telehealth: Payer: Self-pay | Admitting: Hematology

## 2014-07-31 ENCOUNTER — Ambulatory Visit (HOSPITAL_BASED_OUTPATIENT_CLINIC_OR_DEPARTMENT_OTHER): Payer: Medicare Other | Admitting: Hematology

## 2014-07-31 ENCOUNTER — Encounter: Payer: Self-pay | Admitting: Hematology

## 2014-07-31 VITALS — BP 132/66 | HR 75 | Temp 97.8°F | Resp 18 | Ht 66.0 in | Wt 197.7 lb

## 2014-07-31 DIAGNOSIS — N189 Chronic kidney disease, unspecified: Secondary | ICD-10-CM | POA: Diagnosis not present

## 2014-07-31 DIAGNOSIS — Z23 Encounter for immunization: Secondary | ICD-10-CM

## 2014-07-31 DIAGNOSIS — C9000 Multiple myeloma not having achieved remission: Secondary | ICD-10-CM

## 2014-07-31 MED ORDER — INFLUENZA VAC SPLIT QUAD 0.5 ML IM SUSY
0.5000 mL | PREFILLED_SYRINGE | Freq: Once | INTRAMUSCULAR | Status: AC
  Administered 2014-07-31: 0.5 mL via INTRAMUSCULAR
  Filled 2014-07-31: qty 0.5

## 2014-07-31 NOTE — Telephone Encounter (Signed)
Gave avs & cal for April 2016. Sent Dr mess to add order for Bone Survey.

## 2014-07-31 NOTE — Progress Notes (Signed)
[place] OFFICE PROGRESS NOTE  Thomas [name], Thomas [name], [address] [place] [address]  DIAGNOSIS: Smothering Multiple myeloma - Plan: CBC with Differential, Comprehensive metabolic panel (Cmet) - [facility], Lactate dehydrogenase (LDH) - [facility], SPEP & IFE with QIG, Kappa/lambda light chains, Influenza vac split quadrivalent PF (FLUARIX) injection 0.5 mL  Myeloma - Plan: DG Bone Survey Met  Chief Complaint  Patient presents with  . Follow-up    CURRENT TREATMENT: Active surveillance.     Multiple myeloma   01/21/2010 Initial Diagnosis Smothering Multiple myeloma, IgG Kappa. (normal calcium, hemoglobin, negative skeletal survey, creatinine less than 2).    01/21/2010 Bone Marrow Biopsy 11 % plasma cells.    02/18/2010 Procedure Left [name] biopsy showed moderate to severe arterial nephrosclerosis with moderate to severe tubulointerstitial scarring. Immunofluorescence microsocopy showed monoclonal kappa chain stain along the tubular BM.   11/02/2012 Imaging Metastatic survey showed no acute changes from prior.   11/02/2012 Tumor Marker Serum kappa light chains was 60.8.  M protein on serum protein electrophoresis was 1.62 g/dL.    11/09/2012 Tumor Marker Beta 2 microglobulin was 2.58   03/21/2013 Tumor Marker IgG level was 2020, IgA level 51, IgM 16.   11/18/2013 Tumor Marker IgG 1900; Kappa light chains 62.5; M-spike, 1.45 g/dL.  Creatinine 1.8.   11/25/2013 Imaging Metastatic skeletal survey negative for lytic lesions. Faint areas of radiolucency in the frontal bones of the skull and mid-shaft of the left humerus felt to be stable.    07/24/2014 Tumor Marker IgG 1920, Kappa light chain 86, Kappa lamda ratio 54; LDH 206, Creatinine 1.9   INTERVAL HISTORY:  Thomas [name] 79 y.o. male with a history of IgG Kappa Smothering MM is here for follow-up.  He was last seen by Thomas [name] on 03/31/2014.  He reports being admitted to [facility] on March 26th for a couple  days for further evaluation of his heart arrhythmia and atrial fibrillation.  He had cardio conversion with a TTE to determine if clot was present (it was not per patient).  He was also started on amiodarone and is now being followed with cardiology every 3 months. His last visit was in April. He denies chest pain or shortness of breath.     He is accompanied by [name].  He reports having a prostate biopsy about five months ago demonstrating stable disease.  He denies blood in his stools or melena.  He denies infections or bone pain.  Thomas [name] added vitamin D supplementation on his recent visit.   Patient's myeloma markers are stable as tabulated below. He denies any recent infections. He denies any weight loss malaise GI symptoms. He is getting a flu shot in my office and I also encouraged him to get the new pneumonia vaccine through his primary care doctor.  MEDICAL HISTORY: Past Medical History  Diagnosis Date  . Hypertension   . Hypercholesterolemia   . Chronic [name] disease     Hx of bilateral renal cysts.  . History of atrial fibrillation 03/09/09  . Mild aortic stenosis   . Glaucoma   . BPH (benign prostatic hypertrophy)   . Smoldering multiple myeloma   . Prostate cancer 7/12    T1C Ad Ca  s/p TURP, observation only  . Hx of epiglottitis     2011  . Heart murmur   . Sleep apnea     INTERIM HISTORY: has Multiple myeloma; Renal insufficiency; Hypertension; Hypercholesterolemia; Chronic [name] disease;  Mild aortic stenosis; BPH (benign prostatic hypertrophy); Melena; Paroxysmal atrial fibrillation; Encounter for long-term (current) use of other medications; Atrial fibrillation; OSA (obstructive sleep apnea); Acute on chronic diastolic CHF (congestive heart failure), NYHA class 1; and OSA on CPAP on his problem list.    ALLERGIES:  is allergic to benadryl; ceftriaxone sodium; penicillins; contrast media; levaquin; and nsaids.  MEDICATIONS: has a current medication list which  includes the following prescription(s): amiodarone, aspirin, brimonidine-timolol, calcium-vitamin d, vitamin d3, docusate sodium, krill oil, metoprolol, and travoprost (benzalkonium).  SURGICAL HISTORY:  Past Surgical History  Procedure Laterality Date  . Adrenalectomy  02/12/11    Left, for Pheochromocytoma  . Cholecystectomy  02/12/11  . Gastrectomy  1984    S/P gastrectomy for ulcer  . Knee surgery  1971    S/P Right knee surgery  . Transurethral resection of prostate  7/12  . Tee without cardioversion N/A 12/26/2013    Procedure: TRANSESOPHAGEAL ECHOCARDIOGRAM (TEE);  Surgeon: Thomas [name], Thomas [name];  Location: [facility] ENDOSCOPY;  Service: Cardiovascular;  Laterality: N/A;  . Cardioversion N/A 12/26/2013    Procedure: CARDIOVERSION;  Surgeon: Thomas [name], Thomas [name];  Location: [facility];  Service: Cardiovascular;  Laterality: N/A;   PROBLEM LISTS: 1. Hypertension.  2. History of recurrent atrial fibrillation.  3. Mild aortic stenosis.  4. Glaucoma.  5. Bilateral renal cysts.  6. 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  7. History of hypercholesterolemia.  8. History of bleeding gastric ulcer in 1984, status post vagotomy and pyloroplasty.  9. Status post right knee surgery in 1971.  10. Status post left adrenalectomy for a pheochromocytoma by Thomas [name] on 02/02/2011.  REVIEW OF SYSTEMS:   Constitutional: Denies fevers, chills or abnormal weight loss Eyes: Denies blurriness of vision Ears, nose, mouth, throat, and face: Denies mucositis or sore throat Respiratory: Denies cough, dyspnea or wheezes Cardiovascular: Denies palpitation, chest discomfort or lower extremity swelling Gastrointestinal:  Denies nausea, heartburn; changes in bowel habits as noted above Skin: Denies abnormal skin rashes Lymphatics: Denies new lymphadenopathy or easy bruising Neurological:Denies numbness, tingling or new  weaknesses Behavioral/Psych: Mood is stable, no new changes  All other systems were reviewed with the patient and are negative.  PHYSICAL EXAMINATION: ECOG PERFORMANCE STATUS: 0  Blood pressure 132/66, pulse 75, temperature 97.8 F (36.6 C), temperature source Oral, resp. rate 18, height 5' 6"  (1.676 m), weight 197 lb 11.2 oz (89.676 kg), SpO2 97.00%.  GENERAL:alert, no distress and comfortable; mildly obese. Elderly male.  SKIN: skin color, texture, turgor are normal, no rashes or significant lesions EYES: normal, Conjunctiva are pink and non-injected, sclera clear OROPHARYNX:no exudate, no erythema and lips, buccal mucosa, and tongue normal  NECK: supple, thyroid normal size, non-tender, without nodularity LYMPH:  no palpable lymphadenopathy in the cervical, axillary or supraclavicular LUNGS: clear to auscultation with normal breathing effort, no wheezes or rhonchi HEART: regular rate & rhythm and soft SEM and no lower extremity edema ABDOMEN:abdomen soft, non-tender and normal bowel sounds Musculoskeletal:no cyanosis of digits and no clubbing  NEURO: alert & oriented x 3 with fluent speech, no focal motor/sensory deficits  Labs:                   Studies:  METASTATIC BONE SURVEY 2013/12/16 COMPARISON: DG BONE SURVEY MET dated 11/02/2012; DG BONE SURVEY MET dated 02/06/2012  FINDINGS: No new discrete lytic lesions. Faint areas of apparent lucency involving frontal bone of the skull appears  grossly unchanged. No new discrete calvarial lesions. Subtle lucent lesion involving the midshaft of the left humerus is grossly unchanged. Mild (<25%) compression deformities within 2 adjacent mid thoracic vertebral bodies appears grossly unchanged. Lumbar and cervical vertebral body heights are grossly preserved. Mild scoliotic  curvature of the thoracolumbar spine, grossly unchanged. There is multilevel the within the cervical, thoracic and lumbar spine, grossly unchanged. There is  partial ossification of the nuchal ligament. Mild degenerate change of the pubic symphysis. Multiple loose bodies are again seen within the medial aspect of the right knee joint space. Grossly unchanged borderline enlarged cardiac silhouette and mediastinal contours with mild tortuosity and possible slight  ectasia of the thoracic aorta. Surgical clips overlie the expected location of the gastroesophageal junction. Post cholecystectomy.  IMPRESSION:  1. No new discrete lytic lesions to suggest progression of multiple myeloma.  2. Grossly unchanged ill-defined lucencies within the frontal calvarium and left humeral diaphysis.    ASSESSMENT: Thomas [name] 79 y.o. male with a history of Multiple myeloma - Plan: CBC with Differential, Comprehensive metabolic panel (Cmet) - [facility], Lactate dehydrogenase (LDH) - [facility], SPEP & IFE with QIG, Kappa/lambda light chains, Influenza vac split quadrivalent PF (FLUARIX) injection 0.5 mL  Myeloma - Plan: DG Bone Survey Met   PLAN:  Based on NCCN guidelines defining end organ damage with creatinine greater than 2 or a hemoglobin less than 10, he would be considered as smothering Multiple myeloma.   1. Smothering IgG kappa multiple myeloma  --He is doing well clinically. For now, we will continue surveillance every 6 months with labs including CBC, CMP, SPEP and kappa/lamba. We discussed that there is 10% chance per year for the first 5 years that his smothering MM would become active symptomatic multiple myeloma. Thomas [name] seems to have stable disease. There has been no evidence of progression or any symptoms. His renal function seems to be stable. Metastatic survey is as noted above. His initial diagnosis was in 2011 and he has done well with Observation alone strategy.  2. Chronic [name] Disease.  --His creatinine is stable at 1.9 today.  3. Vaccination. --He got flu shot in office and will check with PCP regarding the new pneumovax vaccine.  4.  Follow-up.  -- The patient will return in 6 months, at which time we will check CBC, chemistries, quantitative immunoglobulins, and serum light chains.    All questions were answered. The patient knows to call the clinic with any problems, questions or concerns. We can certainly see the patient much sooner if necessary.  I spent 15 minutes counseling the patient face to face. The total time spent in the appointment was 25 minutes.    Bernadene Bell, Thomas [name] Medical Hematologist/Oncologist [place] Pager: [phone] Office No: [phone]

## 2014-07-31 NOTE — Addendum Note (Signed)
Addended by: Hayes Ludwig on: 07/31/2014 12:03 PM   Modules accepted: Orders

## 2014-08-14 ENCOUNTER — Encounter: Payer: Self-pay | Admitting: Interventional Cardiology

## 2014-08-14 ENCOUNTER — Ambulatory Visit (INDEPENDENT_AMBULATORY_CARE_PROVIDER_SITE_OTHER): Payer: Medicare Other | Admitting: Interventional Cardiology

## 2014-08-14 VITALS — BP 142/60 | HR 46 | Ht 66.0 in | Wt 200.0 lb

## 2014-08-14 DIAGNOSIS — I350 Nonrheumatic aortic (valve) stenosis: Secondary | ICD-10-CM

## 2014-08-14 DIAGNOSIS — Z79899 Other long term (current) drug therapy: Secondary | ICD-10-CM

## 2014-08-14 DIAGNOSIS — N183 Chronic kidney disease, stage 3 unspecified: Secondary | ICD-10-CM

## 2014-08-14 DIAGNOSIS — I495 Sick sinus syndrome: Secondary | ICD-10-CM

## 2014-08-14 DIAGNOSIS — Z9989 Dependence on other enabling machines and devices: Secondary | ICD-10-CM

## 2014-08-14 DIAGNOSIS — I480 Paroxysmal atrial fibrillation: Secondary | ICD-10-CM

## 2014-08-14 DIAGNOSIS — I10 Essential (primary) hypertension: Secondary | ICD-10-CM

## 2014-08-14 DIAGNOSIS — G4733 Obstructive sleep apnea (adult) (pediatric): Secondary | ICD-10-CM

## 2014-08-14 HISTORY — DX: Sick sinus syndrome: I49.5

## 2014-08-14 LAB — HEPATIC FUNCTION PANEL
ALT: 23 U/L (ref 0–53)
AST: 23 U/L (ref 0–37)
Albumin: 3.5 g/dL (ref 3.5–5.2)
Alkaline Phosphatase: 72 U/L (ref 39–117)
Bilirubin, Direct: 0.1 mg/dL (ref 0.0–0.3)
Total Bilirubin: 0.8 mg/dL (ref 0.2–1.2)
Total Protein: 7.6 g/dL (ref 6.0–8.3)

## 2014-08-14 LAB — TSH: TSH: 4.91 u[IU]/mL — ABNORMAL HIGH (ref 0.35–4.50)

## 2014-08-14 MED ORDER — AMLODIPINE BESYLATE 5 MG PO TABS: 5.0000 mg | ORAL_TABLET | Freq: Every day | ORAL | Status: DC

## 2014-08-14 MED ORDER — CLOBETASOL PROPIONATE 0.05 % EX CREA: 1.0000 "application " | TOPICAL_CREAM | Freq: Two times a day (BID) | CUTANEOUS | Status: DC

## 2014-08-14 NOTE — Patient Instructions (Signed)
Your physician has recommended you make the following change in your medication:  1) STOP Metoprolol 2) START Amlodipine 5 mg daily. An Rx has been sent to your pharmacy 3) An Rx for Temovate cream has been sent to your pharmacy. Use as directed  Lab Today: Tsh, Hepatic  Your physician recommends that you return for lab work in: 6 months (Tsh, Hepatic)  Your physician wants you to follow-up in: 6 months You will receive a reminder letter in the mail two months in advance. If you don't receive a letter, please call our office to schedule the follow-up appointment.

## 2014-08-14 NOTE — Progress Notes (Signed)
Patient ID: Thomas [name], male   DOB: 25-May-1935, 79 y.o.   MRN: [id number]    [address], [place]  [address] Phone: [phone] Fax:  [phone]  Date:  08/14/2014   ID:  Thomas [name], DOB Apr 04, 1935, MRN [id number]  PCP:  [name], MD   ASSESSMENT:  1. Severe sinus bradycardia, unresolved after reduction in dose of beta blocker therapy 2. Hypertension, poorly controlled on lower dose beta blocker 3. Chronic diastolic heart failure, stable 4. Amiodarone therapy, monitoring for potential toxicity 5. Mild aortic stenosis  PLAN:  1. Discontinue metoprolol 2. Amlodipine 5 mg daily 3. Refill steroid cream 4. TSH and a Saralyn Pilar panel today and in 6 months 5. Clinical follow-up in 5 months 6. Monitor for continued bradycardia and possible need for pacemaker therapy   SUBJECTIVE: Thomas [name] is a 79 y.o. male who is doing well. He has not had syncope or palpitations. He does not believe he has had any episodes of atrial fib. He denies transient neurological complaints. No orthopnea, PND or lower extremity swelling has occurred. There is been no blood in his urine or stool. He denies abdominal discomfort.   Wt Readings from Last 3 Encounters:  08/14/14 200 lb (90.719 kg)  07/31/14 197 lb 11.2 oz (89.676 kg)  06/25/14 194 lb 6.4 oz (88.179 kg)     Past Medical History  Diagnosis Date  . Hypertension   . Hypercholesterolemia   . Chronic kidney disease     Hx of bilateral renal cysts.  . History of atrial fibrillation 03/09/09  . Mild aortic stenosis   . Glaucoma   . BPH (benign prostatic hypertrophy)   . Smoldering multiple myeloma   . Prostate cancer 7/12    T1C Ad Ca  s/p TURP, observation only  . Hx of epiglottitis     2011  . Heart murmur   . Sleep apnea     Current Outpatient Prescriptions  Medication Sig Dispense Refill  . amiodarone (PACERONE) 200 MG tablet Take 200 mg by mouth daily.     Marland Kitchen aspirin 81 MG tablet  Take 81 mg by mouth daily.    . brimonidine-timolol (COMBIGAN) 0.2-0.5 % ophthalmic solution Place 1 drop into both eyes 2 (two) times daily.     . Calcium Carbonate-Vitamin D (CALCIUM-VITAMIN D) 500-200 MG-UNIT per tablet Take 1 tablet by mouth daily.    . Cholecalciferol (VITAMIN D3) 1000 UNITS CAPS Take 2 capsules by mouth daily.    Marland Kitchen docusate sodium (COLACE) 100 MG capsule Take 200 mg by mouth daily.     Marland Kitchen KRILL OIL PO Take 30 mg by mouth daily.     . metoprolol (LOPRESSOR) 50 MG tablet Take 0.5 tablets (25 mg total) by mouth 2 (two) times daily.    . travoprost, benzalkonium, (TRAVATAN) 0.004 % ophthalmic solution Place 1 drop into both eyes at bedtime.     No current facility-administered medications for this visit.    Allergies:    Allergies  Allergen Reactions  . Benadryl [Diphenhydramine Hcl]     Bad dreams -  Hallucinations.  . Ceftriaxone Sodium Hives  . Penicillins Hives  . Contrast Media [Iodinated Diagnostic Agents]     Poor kidney function  . Levaquin [Levofloxacin]   . Nsaids     Social History:  The patient  reports that he has never smoked. He has never used smokeless tobacco. He reports that he does not drink alcohol or use illicit  drugs.   ROS:  Please see the history of present illness.   Appetite is been stable. Weight is been stable. No peripheral edema. No wheezing. No Nausea, vomiting, tremor, or vision disturbance.   All other systems reviewed and negative.   OBJECTIVE: VS:  BP 142/60 mmHg  Pulse 46  Ht 5' 6"  (1.676 m)  Wt 200 lb (90.719 kg)  BMI 32.30 kg/m2 Well nourished, well developed, in no acute distress, obese HEENT: normal Neck: JVD flat. Carotid bruit absent  Cardiac:  normal S1, S2; RRR; no murmur. Bradycardic Lungs:  clear to auscultation bilaterally, no wheezing, rhonchi or rales Abd: soft, nontender, no hepatomegaly Ext: Edema absent. Pulses 2+ Skin: warm and dry Neuro:  CNs 2-12 intact, no focal abnormalities noted  EKG:  Severe  sinus bradycardia with first AV block       Signed, Illene Labrador III, MD 08/14/2014 10:27 AM

## 2014-08-19 ENCOUNTER — Telehealth: Payer: Self-pay

## 2014-08-19 DIAGNOSIS — Z79899 Other long term (current) drug therapy: Secondary | ICD-10-CM

## 2014-08-19 NOTE — Telephone Encounter (Signed)
-----   Message from [place], MD sent at 08/18/2014  2:08 PM EST ----- Mildly elevated TSH. Liver tests are normal.repeat TSH in 6 weeks. Elevation is due to amiodarone and we need to follow this.

## 2014-08-19 NOTE — Telephone Encounter (Signed)
Pt aware of lab results.Mildly elevated TSH. Liver tests are normal.repeat TSH in 6 weeks. Elevation is due to amiodarone and we need to follow this.pt will come to the office on 12/28 for repeat tsh.pt verbalized understanding.

## 2014-09-29 ENCOUNTER — Other Ambulatory Visit: Payer: Medicare Other

## 2014-10-01 ENCOUNTER — Other Ambulatory Visit (INDEPENDENT_AMBULATORY_CARE_PROVIDER_SITE_OTHER): Payer: Medicare Other | Admitting: *Deleted

## 2014-10-01 DIAGNOSIS — Z79899 Other long term (current) drug therapy: Secondary | ICD-10-CM

## 2014-10-01 LAB — TSH: TSH: 6.98 u[IU]/mL — ABNORMAL HIGH (ref 0.35–4.50)

## 2014-10-08 ENCOUNTER — Telehealth: Payer: Self-pay

## 2014-10-08 DIAGNOSIS — Z79899 Other long term (current) drug therapy: Secondary | ICD-10-CM

## 2014-10-08 MED ORDER — LEVOTHYROXINE SODIUM 50 MCG PO TABS: 50.0000 ug | ORAL_TABLET | Freq: Every day | ORAL | Status: DC

## 2014-10-08 NOTE — Telephone Encounter (Signed)
-----   Message from [name], MD sent at 10/03/2014  5:42 PM EST ----- Thyroid is underactive due to amiodarone.Start Levoxyl 50 micrograms a day. TSH in 6 weeks

## 2014-10-08 NOTE — Telephone Encounter (Signed)
Pt aware of lab results and Dr.Smith's recommendations.Thyroid is underactive due to amiodarone.Start Levoxyl 50 micrograms a day. TSH in 6 weeks. rx sent to pt pharmacy. Lab appt scheduled for 2/15. Pt verbalized understanding.

## 2014-11-17 ENCOUNTER — Other Ambulatory Visit: Payer: Self-pay

## 2014-11-28 ENCOUNTER — Other Ambulatory Visit (INDEPENDENT_AMBULATORY_CARE_PROVIDER_SITE_OTHER): Payer: Medicare Other | Admitting: *Deleted

## 2014-11-28 DIAGNOSIS — Z79899 Other long term (current) drug therapy: Secondary | ICD-10-CM

## 2014-11-28 LAB — TSH: TSH: 3.76 u[IU]/mL (ref 0.35–4.50)

## 2014-12-12 ENCOUNTER — Other Ambulatory Visit: Payer: Self-pay | Admitting: Interventional Cardiology

## 2014-12-17 ENCOUNTER — Telehealth: Payer: Self-pay | Admitting: Internal Medicine

## 2014-12-17 NOTE — Telephone Encounter (Signed)
returned call and spoke with patient and resched appt per pt request....patient ok and aware of new date and time

## 2014-12-24 ENCOUNTER — Telehealth: Payer: Self-pay | Admitting: Internal Medicine

## 2014-12-24 NOTE — Telephone Encounter (Signed)
Spoke with patients wife and she is aware of the new appt due to [employer]

## 2015-01-22 ENCOUNTER — Other Ambulatory Visit: Payer: Medicare Other

## 2015-01-27 ENCOUNTER — Other Ambulatory Visit: Payer: Self-pay | Admitting: Medical Oncology

## 2015-01-28 ENCOUNTER — Other Ambulatory Visit (HOSPITAL_BASED_OUTPATIENT_CLINIC_OR_DEPARTMENT_OTHER): Payer: Medicare Other

## 2015-01-28 ENCOUNTER — Other Ambulatory Visit: Payer: Self-pay | Admitting: Medical Oncology

## 2015-01-28 DIAGNOSIS — C9000 Multiple myeloma not having achieved remission: Secondary | ICD-10-CM

## 2015-01-28 LAB — CBC WITH DIFFERENTIAL/PLATELET
BASO%: 0.2 % (ref 0.0–2.0)
Basophils Absolute: 0 10*3/uL (ref 0.0–0.1)
EOS%: 2.2 % (ref 0.0–7.0)
Eosinophils Absolute: 0.1 10*3/uL (ref 0.0–0.5)
HCT: 46.2 % (ref 38.4–49.9)
HGB: 15.4 g/dL (ref 13.0–17.1)
LYMPH%: 32.4 % (ref 14.0–49.0)
MCH: 31.8 pg (ref 27.2–33.4)
MCHC: 33.4 g/dL (ref 32.0–36.0)
MCV: 95.4 fL (ref 79.3–98.0)
MONO#: 0.5 10*3/uL (ref 0.1–0.9)
MONO%: 8.6 % (ref 0.0–14.0)
NEUT#: 3.5 10*3/uL (ref 1.5–6.5)
NEUT%: 56.6 % (ref 39.0–75.0)
Platelets: 207 10*3/uL (ref 140–400)
RBC: 4.84 10*6/uL (ref 4.20–5.82)
RDW: 14.2 % (ref 11.0–14.6)
WBC: 6.2 10*3/uL (ref 4.0–10.3)
lymph#: 2 10*3/uL (ref 0.9–3.3)

## 2015-01-28 LAB — LACTATE DEHYDROGENASE (CC13): LDH: 231 U/L (ref 125–245)

## 2015-01-29 ENCOUNTER — Ambulatory Visit: Payer: Medicare Other

## 2015-01-30 LAB — KAPPA/LAMBDA LIGHT CHAINS
Kappa free light chain: 81.3 mg/dL — ABNORMAL HIGH (ref 0.33–1.94)
Kappa:Lambda Ratio: 64.02 — ABNORMAL HIGH (ref 0.26–1.65)
Lambda Free Lght Chn: 1.27 mg/dL (ref 0.57–2.63)

## 2015-01-30 LAB — IGA: IgA: 40 mg/dL — ABNORMAL LOW (ref 68–379)

## 2015-01-30 LAB — BETA 2 MICROGLOBULIN, SERUM: Beta-2 Microglobulin: 4 mg/L — ABNORMAL HIGH (ref <=2.51)

## 2015-01-30 LAB — IGG: IgG (Immunoglobin G), Serum: 2110 mg/dL — ABNORMAL HIGH (ref 650–1600)

## 2015-01-30 LAB — IGM: IgM, Serum: 19 mg/dL — ABNORMAL LOW (ref 41–251)

## 2015-02-04 ENCOUNTER — Encounter: Payer: Self-pay | Admitting: Internal Medicine

## 2015-02-04 ENCOUNTER — Ambulatory Visit: Payer: Medicare Other | Admitting: Internal Medicine

## 2015-02-04 ENCOUNTER — Ambulatory Visit (HOSPITAL_BASED_OUTPATIENT_CLINIC_OR_DEPARTMENT_OTHER): Payer: Medicare Other | Admitting: Internal Medicine

## 2015-02-04 ENCOUNTER — Telehealth: Payer: Self-pay | Admitting: Internal Medicine

## 2015-02-04 VITALS — BP 151/63 | HR 63 | Temp 98.2°F | Resp 18 | Ht 66.0 in | Wt 206.0 lb

## 2015-02-04 DIAGNOSIS — N189 Chronic kidney disease, unspecified: Secondary | ICD-10-CM | POA: Diagnosis not present

## 2015-02-04 DIAGNOSIS — C9000 Multiple myeloma not having achieved remission: Secondary | ICD-10-CM | POA: Diagnosis not present

## 2015-02-04 NOTE — Telephone Encounter (Signed)
Gave patient avs report and appointments for October and November.  °

## 2015-02-04 NOTE — Progress Notes (Signed)
[place] [phone]   Fax:(336) [place], [place] [address]  DIAGNOSIS: 1) IgG Kappa Smothering multiple myeloma diagnosed in February 2011 with 11% plasma cells on the bone marrow biopsy. 2) history of pheochromocytoma LEFT adrenal gland status post resection on 03/16/2009  PRIOR THERAPY: None  CURRENT THERAPY: Observation  INTERVAL HISTORY: Thomas [name] 80 y.o. male returns to the clinic today for follow-up visit accompanied by his wife. He is a former patient of Dr. Jamse Arn, Dr. Ralene Ok, Dr. Juliann Mule and Dr. Ruben Gottron who is here today to establish care with me after these physicians left the practice. The patient was diagnosed with smoldering multiple myeloma in April 2011 during evaluation for renal insufficiency. He had a bone marrow biopsy and aspirate performed on 01/21/2010 which showed 11% plasma cells. He has not received any active treatment and has been on observation for the last 5 years. He has no end organ damage except for the chronic renal insufficiency. He has no evidence of anemia or hypercalcemia. Skeletal bone survey that was performed in 2011 as well as February 2015 showed no changes is specific for myeloma or metastatic disease. The patient is feeling fine today with no specific complaints. He denied having any significant weight loss or night sweats. He has no chest pain, shortness of breath, cough or hemoptysis. The patient denied having any significant nausea or vomiting. He is followed by Dr. Moshe Cipro for his renal insufficiency. The patient had repeat myeloma panel performed recently and he is here for evaluation and discussion of his lab results.  MEDICAL HISTORY: Past Medical History  Diagnosis Date  . Hypertension   . Hypercholesterolemia   . Chronic kidney disease     Hx of bilateral renal cysts.  . History of atrial fibrillation 03/09/09  .  Mild aortic stenosis   . Glaucoma   . BPH (benign prostatic hypertrophy)   . Smoldering multiple myeloma   . Prostate cancer 7/12    T1C Ad Ca  s/p TURP, observation only  . Hx of epiglottitis     2011  . Heart murmur   . Sleep apnea     ALLERGIES:  is allergic to benadryl; ceftriaxone sodium; penicillins; contrast media; levaquin; and nsaids.  MEDICATIONS:  Current Outpatient Prescriptions  Medication Sig Dispense Refill  . amiodarone (PACERONE) 200 MG tablet Take 200 mg by mouth daily.     Marland Kitchen amLODipine (NORVASC) 5 MG tablet Take 1 tablet (5 mg total) by mouth daily. 30 tablet 11  . aspirin 81 MG tablet Take 81 mg by mouth daily.    . brimonidine-timolol (COMBIGAN) 0.2-0.5 % ophthalmic solution Place 1 drop into both eyes 2 (two) times daily.     . Cholecalciferol (VITAMIN D3) 1000 UNITS CAPS Take 2 capsules by mouth daily.    . clobetasol cream (TEMOVATE) 6.12 % Apply 1 application topically 2 (two) times daily. 30 g 1  . docusate sodium (COLACE) 100 MG capsule Take 200 mg by mouth daily.     Marland Kitchen levothyroxine (SYNTHROID, LEVOTHROID) 50 MCG tablet Take 1 tablet (50 mcg total) by mouth daily before breakfast. 30 tablet 5  . travoprost, benzalkonium, (TRAVATAN) 0.004 % ophthalmic solution Place 1 drop into both eyes at bedtime.    Marland Kitchen KRILL OIL PO Take 30 mg by mouth daily.      No current facility-administered medications for this visit.    SURGICAL  HISTORY:  Past Surgical History  Procedure Laterality Date  . Adrenalectomy  02/12/11    Left, for Pheochromocytoma  . Cholecystectomy  02/12/11  . Gastrectomy  1984    S/P gastrectomy for ulcer  . Knee surgery  1971    S/P Right knee surgery  . Transurethral resection of prostate  7/12  . Tee without cardioversion N/A 12/26/2013    Procedure: TRANSESOPHAGEAL ECHOCARDIOGRAM (TEE);  Surgeon: [name], MD;  Location: [facility];  Service: Cardiovascular;  Laterality: N/A;  . Cardioversion N/A 12/26/2013    Procedure:  CARDIOVERSION;  Surgeon: [name], MD;  Location: [facility] ENDOSCOPY;  Service: Cardiovascular;  Laterality: N/A;    REVIEW OF SYSTEMS:  Constitutional: negative Eyes: negative Ears, nose, mouth, throat, and face: negative Respiratory: negative Cardiovascular: negative Gastrointestinal: negative Genitourinary:negative Integument/breast: negative Hematologic/lymphatic: negative Musculoskeletal:negative Neurological: negative Behavioral/Psych: negative Endocrine: negative Allergic/Immunologic: negative   PHYSICAL EXAMINATION: General appearance: alert, cooperative and no distress Head: Normocephalic, without obvious abnormality, atraumatic Neck: no adenopathy, no JVD, supple, symmetrical, trachea midline and thyroid not enlarged, symmetric, no tenderness/mass/nodules Lymph nodes: Cervical, supraclavicular, and axillary nodes normal. Resp: clear to auscultation bilaterally Back: symmetric, no curvature. ROM normal. No CVA tenderness. Cardio: regular rate and rhythm, S1, S2 normal, no murmur, click, rub or gallop GI: soft, non-tender; bowel sounds normal; no masses,  no organomegaly Extremities: extremities normal, atraumatic, no cyanosis or edema Neurologic: Alert and oriented X 3, normal strength and tone. Normal symmetric reflexes. Normal coordination and gait  ECOG PERFORMANCE STATUS: 1 - Symptomatic but completely ambulatory  Blood pressure 151/63, pulse 63, temperature 98.2 F (36.8 C), temperature source Oral, resp. rate 18, height _0  (1.676 m), weight 206 lb (93.441 kg), SpO2 97 %.  LABORATORY DATA: Lab Results  Component Value Date   WBC 6.2 01/28/2015   HGB 15.4 01/28/2015   HCT 46.2 01/28/2015   MCV 95.4 01/28/2015   PLT 207 01/28/2015      Chemistry      Component Value Date/Time   NA 137 07/24/2014 1035   NA 136 01/10/2014 0841   K 4.2 07/24/2014 1035   K 4.4 01/10/2014 0841   CL 103 01/10/2014 0841   CL 109* 03/21/2013 0958   CO2 26 07/24/2014 1035    CO2 28 01/10/2014 0841   BUN 21.0 07/24/2014 1035   BUN 21 01/10/2014 0841   CREATININE 1.9* 07/24/2014 1035   CREATININE 2.0* 01/10/2014 0841      Component Value Date/Time   CALCIUM 9.8 07/24/2014 1035   CALCIUM 10.0 01/10/2014 0841   ALKPHOS 72 08/14/2014 1044   ALKPHOS 85 07/24/2014 1035   AST 23 08/14/2014 1044   AST 27 07/24/2014 1035   ALT 23 08/14/2014 1044   ALT 30 07/24/2014 1035   BILITOT 0.8 08/14/2014 1044   BILITOT 0.72 07/24/2014 1035       RADIOGRAPHIC STUDIES: No results found.  ASSESSMENT AND PLAN: This is a very pleasant 80 years old white male with IgG smoldering multiple myeloma diagnosed in April 2011. The patient is doing fine and he has no evidence of end organ damage except for the chronic renal insufficiency. His multiple myeloma today is unremarkable for any disease progression. I had a lengthy discussion with the patient and his wife today about his current condition and treatment options. I recommended for the patient to continue on observation for now. We will continue to monitor his renal function as well as hemoglobin and hematocrit and calcium level closely.  I will see the patient back for follow-up visit in 6 months with repeat myeloma panel for reevaluation of his disease. He was advised to call immediately if he has any concerning symptoms in the interval. The patient voices understanding of current disease status and treatment options and is in agreement with the current care plan.  All questions were answered. The patient knows to call the clinic with any problems, questions or concerns. We can certainly see the patient much sooner if necessary.  I spent 15 minutes counseling the patient face to face. The total time spent in the appointment was 25 minutes.  Disclaimer: This note was dictated with voice recognition software. Similar sounding words can inadvertently be transcribed and may not be corrected upon review.

## 2015-02-07 ENCOUNTER — Other Ambulatory Visit: Payer: Self-pay | Admitting: Interventional Cardiology

## 2015-02-10 ENCOUNTER — Ambulatory Visit (INDEPENDENT_AMBULATORY_CARE_PROVIDER_SITE_OTHER): Payer: Medicare Other | Admitting: Interventional Cardiology

## 2015-02-10 ENCOUNTER — Encounter: Payer: Self-pay | Admitting: Interventional Cardiology

## 2015-02-10 VITALS — BP 160/76 | HR 51 | Ht 66.0 in | Wt 207.6 lb

## 2015-02-10 DIAGNOSIS — I10 Essential (primary) hypertension: Secondary | ICD-10-CM

## 2015-02-10 DIAGNOSIS — I495 Sick sinus syndrome: Secondary | ICD-10-CM

## 2015-02-10 DIAGNOSIS — I350 Nonrheumatic aortic (valve) stenosis: Secondary | ICD-10-CM

## 2015-02-10 DIAGNOSIS — G4733 Obstructive sleep apnea (adult) (pediatric): Secondary | ICD-10-CM

## 2015-02-10 DIAGNOSIS — Z79899 Other long term (current) drug therapy: Secondary | ICD-10-CM | POA: Diagnosis not present

## 2015-02-10 DIAGNOSIS — I5032 Chronic diastolic (congestive) heart failure: Secondary | ICD-10-CM

## 2015-02-10 DIAGNOSIS — I480 Paroxysmal atrial fibrillation: Secondary | ICD-10-CM | POA: Diagnosis not present

## 2015-02-10 DIAGNOSIS — Z9989 Dependence on other enabling machines and devices: Secondary | ICD-10-CM

## 2015-02-10 LAB — HEPATIC FUNCTION PANEL
ALT: 24 U/L (ref 0–53)
AST: 25 U/L (ref 0–37)
Albumin: 3.9 g/dL (ref 3.5–5.2)
Alkaline Phosphatase: 79 U/L (ref 39–117)
Bilirubin, Direct: 0.5 mg/dL — ABNORMAL HIGH (ref 0.0–0.3)
Total Bilirubin: 0.7 mg/dL (ref 0.2–1.2)
Total Protein: 7.9 g/dL (ref 6.0–8.3)

## 2015-02-10 LAB — TSH: TSH: 4.69 u[IU]/mL — ABNORMAL HIGH (ref 0.35–4.50)

## 2015-02-10 NOTE — Progress Notes (Signed)
Cardiology Office Note   Date:  02/10/2015   ID:  MARQUEZ CEESAY, DOB [birth year], MRN [id number]  PCP:  [name], MD  Cardiologist:   [name], MD   Chief Complaint  Patient presents with  . Atrial Fibrillation      History of Present Illness: Thomas [name] is a 80 y.o. male who presents for sinus node dysfunction, paroxysmal atrial fibrillation, hypertension, chronic diastolic heart failure, and amiodarone therapy.  He has no cardiovascular complaints. There've not been any episodes of syncope, prolonged palpitation, edema, orthopnea, or PND. He denies angina.    Past Medical History  Diagnosis Date  . Hypertension   . Hypercholesterolemia   . Chronic kidney disease     Hx of bilateral renal cysts.  . History of atrial fibrillation 03/09/09  . Mild aortic stenosis   . Glaucoma   . BPH (benign prostatic hypertrophy)   . Smoldering multiple myeloma   . Prostate cancer 7/12    T1C Ad Ca  s/p TURP, observation only  . Hx of epiglottitis     2011  . Heart murmur   . Sleep apnea     Past Surgical History  Procedure Laterality Date  . Adrenalectomy  02/12/11    Left, for Pheochromocytoma  . Cholecystectomy  02/12/11  . Gastrectomy  1984    S/P gastrectomy for ulcer  . Knee surgery  1971    S/P Right knee surgery  . Transurethral resection of prostate  7/12  . Tee without cardioversion N/A 12/26/2013    Procedure: TRANSESOPHAGEAL ECHOCARDIOGRAM (TEE);  Surgeon: [name], MD;  Location: [place];  Service: Cardiovascular;  Laterality: N/A;  . Cardioversion N/A 12/26/2013    Procedure: CARDIOVERSION;  Surgeon: [name], MD;  Location[facility] ENDOSCOPY;  Service: Cardiovascular;  Laterality: N/A;     Current Outpatient Prescriptions  Medication Sig Dispense Refill  . amiodarone (PACERONE) 200 MG tablet Take 200 mg by mouth daily.     Marland Kitchen amLODipine (NORVASC) 5 MG tablet Take 1 tablet (5 mg total) by mouth daily. 30 tablet 11  . aspirin 81  MG tablet Take 81 mg by mouth daily.    . brimonidine-timolol (COMBIGAN) 0.2-0.5 % ophthalmic solution Place 1 drop into both eyes 2 (two) times daily.     . Cholecalciferol (VITAMIN D3) 1000 UNITS CAPS Take 2 capsules by mouth daily.    . clobetasol cream (TEMOVATE) 1.76 % Apply 1 application topically 2 (two) times daily. 30 g 1  . docusate sodium (COLACE) 100 MG capsule Take 200 mg by mouth daily.     Marland Kitchen levothyroxine (SYNTHROID, LEVOTHROID) 50 MCG tablet Take 1 tablet (50 mcg total) by mouth daily before breakfast. 30 tablet 5  . TRAVATAN Z 0.004 % SOLN ophthalmic solution Place 1 drop into both eyes at bedtime.     No current facility-administered medications for this visit.    Allergies:   Benadryl; Ceftriaxone sodium; Contrast media; Nsaids; Penicillins; and Levaquin    Social History:  The patient  reports that he has never smoked. He has never used smokeless tobacco. He reports that he does not drink alcohol or use illicit drugs.   Family History:  The patient's family history includes Kidney Stones in his brother; Liver disease in his sister, sister, and sister; Melanoma in his brother; Polymyositis in his mother; Prostate cancer in his brother and father.    ROS:  Please see the history of present illness.   Otherwise,  review of systems are positive for back discomfort.   All other systems are reviewed and negative.    PHYSICAL EXAM: VS:  BP 160/76 mmHg  Pulse 51  Ht 5' 6"  (1.676 m)  Wt 207 lb 9.6 oz (94.167 kg)  BMI 33.52 kg/m2  SpO2 97% , BMI Body mass index is 33.52 kg/(m^2). GEN: Well nourished, well developed, in no acute distress HEENT: normal Neck: no JVD, carotid bruits, or masses Cardiac: RRR; 2/6 crescendo decrescendo left sternal border and right upper sternal border systolic murmurs, rubs, or gallops,no edema . Respiratory:  clear to auscultation bilaterally, normal work of breathing GI: soft, nontender, nondistended, + BS MS: no deformity or atrophy Skin:  warm and dry, no rash Neuro:  Strength and sensation are intact Psych: euthymic mood, full affect   EKG:  EKG is not ordered today.    Recent Labs: 07/24/2014: BUN 21.0; Creatinine 1.9*; Potassium 4.2; Sodium 137 08/14/2014: ALT 23 11/28/2014: TSH 3.76 01/28/2015: Hemoglobin 15.4; Platelets 207    Lipid Panel No results found for: CHOL, TRIG, HDL, CHOLHDL, VLDL, LDLCALC, LDLDIRECT    Wt Readings from Last 3 Encounters:  02/10/15 207 lb 9.6 oz (94.167 kg)  02/04/15 206 lb (93.441 kg)  08/14/14 200 lb (90.719 kg)      Other studies Reviewed: Additional studies/ records that were reviewed today include: Medical records have been reviewed.. Review of the above records demonstrates: No significant new data is noted. He does have multiple myeloma that is stable.   ASSESSMENT AND PLAN:  Sinus node dysfunction: Stable with pacemaker and medical therapy  Paroxysmal atrial fibrillation - no recent recurrences  OSA on CPAP: Stable  Essential hypertension: Elevated systolic pressure. We set 140/90 mmHg as his target. He will monitor his blood pressure closely and let me know if he has pre-dominantly running higher than our goal.  On amiodarone therapy - no obvious toxicity  Chronic diastolic heart failure: No volume overload  Mild aortic stenosis: No recent evaluation     Current medicines are reviewed at length with the patient today.  The patient does not have concerns regarding medicines.  The following changes have been made:  no change. Low salt diet. Weight loss is abdicated.  Labs/ tests ordered today include:   Orders Placed This Encounter  Procedures  . DG Chest 2 View  . TSH  . Hepatic function panel     Disposition:   FU with HS in 6 months  Signed, [name], MD  02/10/2015 9:25 AM    [facility] [address], [place], [place]  [address] Phone: [phone]; Fax: [phone]

## 2015-02-10 NOTE — Patient Instructions (Signed)
Medication Instructions:  Your physician recommends that you continue on your current medications as directed. Please refer to the Current Medication list given to you today.   Labwork: Tsh, Hepatic today  Your physician recommends that you return for lab work in: 6 months Tsh, Hepatic  Testing/Procedures: A chest x-ray takes a picture of the organs and structures inside the chest, including the heart, lungs, and blood vessels. This test can show several things, including, whether the heart is enlarges; whether fluid is building up in the lungs; and whether pacemaker / defibrillator leads are still in place.(To be performed in 6 months)  Follow-Up: Your physician wants you to follow-up in: 6 months with Dr.Smith You will receive a reminder letter in the mail two months in advance. If you don't receive a letter, please call our office to schedule the follow-up appointment.   Any Other Special Instructions Will Be Listed Below (If Applicable).

## 2015-02-12 ENCOUNTER — Telehealth: Payer: Self-pay

## 2015-02-12 DIAGNOSIS — R7989 Other specified abnormal findings of blood chemistry: Secondary | ICD-10-CM

## 2015-02-12 NOTE — Telephone Encounter (Signed)
-----   Message from Belva Crome, MD sent at 02/11/2015  6:47 PM EDT ----- Mildly abnormal thyroid stimulating hormone. Repeat in 6-8 weeks.

## 2015-02-12 NOTE — Telephone Encounter (Signed)
Pt wife aware of lab results and Dr.Smith's recommendation. Mildly abnormal thyroid stimulating hormone. Repeat in 6-8 weeks. Pt will call back to schedule the lab appt. Results will be released to my chart. Pt wife verbalized understanding.

## 2015-03-23 ENCOUNTER — Other Ambulatory Visit (INDEPENDENT_AMBULATORY_CARE_PROVIDER_SITE_OTHER): Payer: Medicare Other | Admitting: *Deleted

## 2015-03-23 DIAGNOSIS — R7989 Other specified abnormal findings of blood chemistry: Secondary | ICD-10-CM | POA: Diagnosis not present

## 2015-03-23 DIAGNOSIS — I10 Essential (primary) hypertension: Secondary | ICD-10-CM

## 2015-03-23 LAB — TSH: TSH: 4.34 u[IU]/mL (ref 0.35–4.50)

## 2015-03-23 NOTE — Addendum Note (Signed)
Addended by: Eulis Foster on: 03/23/2015 12:21 PM   Modules accepted: Orders

## 2015-03-29 ENCOUNTER — Other Ambulatory Visit: Payer: Self-pay | Admitting: Interventional Cardiology

## 2015-03-30 ENCOUNTER — Other Ambulatory Visit: Payer: Self-pay

## 2015-03-30 DIAGNOSIS — Z79899 Other long term (current) drug therapy: Secondary | ICD-10-CM

## 2015-03-30 MED ORDER — LEVOTHYROXINE SODIUM 50 MCG PO TABS: 50.0000 ug | ORAL_TABLET | Freq: Every day | ORAL | Status: DC

## 2015-04-05 ENCOUNTER — Other Ambulatory Visit: Payer: Self-pay | Admitting: Interventional Cardiology

## 2015-05-14 ENCOUNTER — Telehealth: Payer: Self-pay | Admitting: *Deleted

## 2015-05-14 NOTE — Telephone Encounter (Signed)
Faxed signed CPAP order to [facility].

## 2015-07-16 ENCOUNTER — Telehealth: Payer: Self-pay | Admitting: Internal Medicine

## 2015-07-16 NOTE — Telephone Encounter (Signed)
PAL - moved 11/2 /fu to 11/21. Lab for 10/26 remains the same. Left message for patient and mailed schedule.

## 2015-07-18 ENCOUNTER — Other Ambulatory Visit: Payer: Self-pay | Admitting: Interventional Cardiology

## 2015-07-23 ENCOUNTER — Telehealth: Payer: Self-pay | Admitting: Medical Oncology

## 2015-07-23 ENCOUNTER — Telehealth: Payer: Self-pay | Admitting: Interventional Cardiology

## 2015-07-23 NOTE — Telephone Encounter (Signed)
Returned pt call. Adv pt that Dr.Smith did order a cxr  For pt to have in Nov 2016. Adv pt that it was for  his routine testing , since he is on Amio. Adv pt that he can walk in and have it done at [facility] any day in Nov he does not need an appt. Pt voiced appreciation for the call back and verbalized understanding.

## 2015-07-23 NOTE — Telephone Encounter (Signed)
Pt has information

## 2015-07-23 NOTE — Telephone Encounter (Signed)
New message  Pt called states that there is an order on file for DG Chest img. Pt states he is not sure if Dr. Tamala Julian ordered sthis test and if he did. What was it ordered for. Please call back to discuss

## 2015-07-28 ENCOUNTER — Ambulatory Visit
Admission: RE | Admit: 2015-07-28 | Discharge: 2015-07-28 | Disposition: A | Discharge status: Home / Self Care | Payer: Medicare Other | Source: Ambulatory Visit | Attending: Interventional Cardiology | Admitting: Interventional Cardiology

## 2015-07-28 DIAGNOSIS — Z79899 Other long term (current) drug therapy: Secondary | ICD-10-CM

## 2015-07-29 ENCOUNTER — Telehealth: Payer: Self-pay | Admitting: Interventional Cardiology

## 2015-07-29 ENCOUNTER — Other Ambulatory Visit (HOSPITAL_BASED_OUTPATIENT_CLINIC_OR_DEPARTMENT_OTHER): Payer: Medicare Other

## 2015-07-29 DIAGNOSIS — C9000 Multiple myeloma not having achieved remission: Secondary | ICD-10-CM

## 2015-07-29 LAB — CBC WITH DIFFERENTIAL/PLATELET
BASO%: 0.2 % (ref 0.0–2.0)
Basophils Absolute: 0 10*3/uL (ref 0.0–0.1)
EOS%: 1.9 % (ref 0.0–7.0)
Eosinophils Absolute: 0.1 10*3/uL (ref 0.0–0.5)
HCT: 46.3 % (ref 38.4–49.9)
HGB: 15.7 g/dL (ref 13.0–17.1)
LYMPH%: 24.3 % (ref 14.0–49.0)
MCH: 32.2 pg (ref 27.2–33.4)
MCHC: 33.9 g/dL (ref 32.0–36.0)
MCV: 95.2 fL (ref 79.3–98.0)
MONO#: 0.6 10*3/uL (ref 0.1–0.9)
MONO%: 9.7 % (ref 0.0–14.0)
NEUT#: 4.3 10*3/uL (ref 1.5–6.5)
NEUT%: 63.9 % (ref 39.0–75.0)
Platelets: 195 10*3/uL (ref 140–400)
RBC: 4.86 10*6/uL (ref 4.20–5.82)
RDW: 14.1 % (ref 11.0–14.6)
WBC: 6.7 10*3/uL (ref 4.0–10.3)
lymph#: 1.6 10*3/uL (ref 0.9–3.3)

## 2015-07-29 LAB — COMPREHENSIVE METABOLIC PANEL (CC13)
ALT: 26 U/L (ref 0–55)
AST: 24 U/L (ref 5–34)
Albumin: 4 g/dL (ref 3.5–5.0)
Alkaline Phosphatase: 91 U/L (ref 40–150)
Anion Gap: 5 mEq/L (ref 3–11)
BUN: 23.1 mg/dL (ref 7.0–26.0)
CO2: 23 mEq/L (ref 22–29)
Calcium: 9.8 mg/dL (ref 8.4–10.4)
Chloride: 112 mEq/L — ABNORMAL HIGH (ref 98–109)
Creatinine: 2.1 mg/dL — ABNORMAL HIGH (ref 0.7–1.3)
EGFR: 30 mL/min/{1.73_m2} — ABNORMAL LOW (ref >90)
Glucose: 93 mg/dl (ref 70–140)
Potassium: 4.4 mEq/L (ref 3.5–5.1)
Sodium: 139 mEq/L (ref 136–145)
Total Bilirubin: 0.85 mg/dL (ref 0.20–1.20)
Total Protein: 7.8 g/dL (ref 6.4–8.3)

## 2015-07-29 LAB — LACTATE DEHYDROGENASE (CC13): LDH: 234 U/L (ref 125–245)

## 2015-07-29 NOTE — Telephone Encounter (Signed)
New Message    Pt calling stating that he is returning Thomas [name]'s call.

## 2015-07-29 NOTE — Telephone Encounter (Signed)
Pt is aware of Cxray results.

## 2015-07-31 LAB — IGG, IGA, IGM
IgA: 42 mg/dL — ABNORMAL LOW (ref 68–379)
IgG (Immunoglobin G), Serum: 2180 mg/dL — ABNORMAL HIGH (ref 650–1600)
IgM, Serum: 15 mg/dL — ABNORMAL LOW (ref 41–251)

## 2015-07-31 LAB — KAPPA/LAMBDA LIGHT CHAINS
Kappa free light chain: 77.1 mg/dL — ABNORMAL HIGH (ref 0.33–1.94)
Kappa:Lambda Ratio: 78.67 — ABNORMAL HIGH (ref 0.26–1.65)
Lambda Free Lght Chn: 0.98 mg/dL (ref 0.57–2.63)

## 2015-07-31 LAB — BETA 2 MICROGLOBULIN, SERUM: Beta-2 Microglobulin: 3.99 mg/L — ABNORMAL HIGH (ref <=2.51)

## 2015-08-04 ENCOUNTER — Telehealth: Payer: Self-pay | Admitting: Interventional Cardiology

## 2015-08-04 NOTE — Telephone Encounter (Signed)
Returned pt call. Pt sts that he is returning a call from our office. Adv pt that we were calling to give him his cxr results. Pt was given results previously on 10/26. Nothing further needed at this time

## 2015-08-04 NOTE — Telephone Encounter (Signed)
NeW Message  Pt stated he is returning phone call to Dr Thompson Caul RN. Please call back and discuss.

## 2015-08-05 ENCOUNTER — Ambulatory Visit: Payer: Medicare Other | Admitting: Internal Medicine

## 2015-08-18 ENCOUNTER — Other Ambulatory Visit: Payer: Self-pay | Admitting: Interventional Cardiology

## 2015-08-24 ENCOUNTER — Telehealth: Payer: Self-pay | Admitting: Internal Medicine

## 2015-08-24 ENCOUNTER — Ambulatory Visit (HOSPITAL_BASED_OUTPATIENT_CLINIC_OR_DEPARTMENT_OTHER): Payer: Medicare Other | Admitting: Internal Medicine

## 2015-08-24 ENCOUNTER — Encounter: Payer: Self-pay | Admitting: Internal Medicine

## 2015-08-24 VITALS — BP 162/64 | HR 60 | Temp 97.6°F | Resp 18 | Ht 66.0 in | Wt 209.6 lb

## 2015-08-24 DIAGNOSIS — N289 Disorder of kidney and ureter, unspecified: Secondary | ICD-10-CM

## 2015-08-24 DIAGNOSIS — C9000 Multiple myeloma not having achieved remission: Secondary | ICD-10-CM

## 2015-08-24 NOTE — Progress Notes (Signed)
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 2) history of pheochromocytoma LEFT adrenal gland status post resection on 03/16/2009  PRIOR THERAPY: None  CURRENT THERAPY: Observation  INTERVAL HISTORY: Thomas [name] 80 y.o. male returns to the clinic today for follow-up visit accompanied by his wife. The patient is feeling fine today with no specific complaints except for arthritis in his knees. He denied having any significant weight loss or night sweats. He has no chest pain, shortness of breath, cough or hemoptysis. The patient denied having any significant nausea or vomiting. He is followed by Dr. Moshe Cipro for his renal insufficiency but has not seen her for the last 2 years. The patient had repeat myeloma panel performed recently and he is here for evaluation and discussion of his lab results.  MEDICAL HISTORY: Past Medical History  Diagnosis Date  . Hypertension   . Hypercholesterolemia   . Chronic kidney disease     Hx of bilateral renal cysts.  . History of atrial fibrillation 03/09/09  . Mild aortic stenosis   . Glaucoma   . BPH (benign prostatic hypertrophy)   . Smoldering multiple myeloma ([place])   . Prostate cancer ([place]) 7/12    T1C Ad Ca  s/p TURP, observation only  . Hx of epiglottitis     2011  . Heart murmur   . Sleep apnea     ALLERGIES:  is allergic to benadryl; ceftriaxone sodium; contrast media; nsaids; penicillins; and levaquin.  MEDICATIONS:  Current Outpatient Prescriptions  Medication Sig Dispense Refill  . amiodarone (PACERONE) 200 MG tablet TAKE 1 TABLET BY MOUTH EVERY DAY 30 tablet 3  . amLODipine (NORVASC) 5 MG tablet Take 1 tablet (5 mg total) by mouth daily. 30 tablet 11  . aspirin 81 MG  tablet Take 81 mg by mouth daily.    . brimonidine-timolol (COMBIGAN) 0.2-0.5 % ophthalmic solution Place 1 drop into both eyes 2 (two) times daily.     . Cholecalciferol (VITAMIN D3) 1000 UNITS CAPS Take 2 capsules by mouth daily.    . clobetasol cream (TEMOVATE) 6.25 % Apply 1 application topically 2 (two) times daily. 30 g 1  . levothyroxine (SYNTHROID, LEVOTHROID) 50 MCG tablet Take 1 tablet (50 mcg total) by mouth daily before breakfast. 30 tablet 9  . TRAVATAN Z 0.004 % SOLN ophthalmic solution Place 1 drop into both eyes at bedtime.    Marland Kitchen amiodarone (PACERONE) 200 MG tablet Take 200 mg by mouth daily.     Marland Kitchen FLUZONE HIGH-DOSE 0.5 ML SUSY      No current facility-administered medications for this visit.    SURGICAL HISTORY:  Past Surgical History  Procedure Laterality Date  . Adrenalectomy  02/12/11    Left, for Pheochromocytoma  . Cholecystectomy  02/12/11  . Gastrectomy  1984    S/P gastrectomy for ulcer  . Knee surgery  1971    S/P Right knee surgery  . Transurethral resection of prostate  7/12  . Tee without cardioversion N/A 12/26/2013    Procedure: TRANSESOPHAGEAL ECHOCARDIOGRAM (TEE);  Surgeon: [name], MD;  Location: [facility] ENDOSCOPY;  Service: Cardiovascular;  Laterality: N/A;  . Cardioversion N/A 12/26/2013    Procedure: CARDIOVERSION;  Surgeon: [name], MD;  Location: [place];  Service: Cardiovascular;  Laterality: N/A;    REVIEW OF SYSTEMS:  A comprehensive review of systems was negative except for: Musculoskeletal: positive for arthralgias   PHYSICAL EXAMINATION: General appearance: alert, cooperative and no distress Head: Normocephalic, without obvious abnormality, atraumatic Neck: no adenopathy, no JVD, supple, symmetrical, trachea midline and thyroid not enlarged, symmetric, no tenderness/mass/nodules Lymph nodes: Cervical, supraclavicular, and axillary nodes normal. Resp: clear to auscultation bilaterally Back: symmetric, no curvature. ROM normal. No  CVA tenderness. Cardio: regular rate and rhythm, S1, S2 normal, no murmur, click, rub or gallop GI: soft, non-tender; bowel sounds normal; no masses,  no organomegaly Extremities: extremities normal, atraumatic, no cyanosis or edema Neurologic: Alert and oriented X 3, normal strength and tone. Normal symmetric reflexes. Normal coordination and gait  ECOG PERFORMANCE STATUS: 1 - Symptomatic but completely ambulatory  Blood pressure 162/64, pulse 60, temperature 97.6 F (36.4 C), temperature source Oral, resp. rate 18, height $RemoveBe'5\' 6"'dFjoKqCod$  (1.676 m), weight 209 lb 9.6 oz (95.074 kg), SpO2 99 %.  LABORATORY DATA: Lab Results  Component Value Date   WBC 6.7 07/29/2015   HGB 15.7 07/29/2015   HCT 46.3 07/29/2015   MCV 95.2 07/29/2015   PLT 195 07/29/2015      Chemistry      Component Value Date/Time   NA 139 07/29/2015 1458   NA 136 01/10/2014 0841   K 4.4 07/29/2015 1458   K 4.4 01/10/2014 0841   CL 103 01/10/2014 0841   CL 109* 03/21/2013 0958   CO2 23 07/29/2015 1458   CO2 28 01/10/2014 0841   BUN 23.1 07/29/2015 1458   BUN 21 01/10/2014 0841   CREATININE 2.1* 07/29/2015 1458   CREATININE 2.0* 01/10/2014 0841      Component Value Date/Time   CALCIUM 9.8 07/29/2015 1458   CALCIUM 10.0 01/10/2014 0841   ALKPHOS 91 07/29/2015 1458   ALKPHOS 79 02/10/2015 0940   AST 24 07/29/2015 1458   AST 25 02/10/2015 0940   ALT 26 07/29/2015 1458   ALT 24 02/10/2015 0940   BILITOT 0.85 07/29/2015 1458   BILITOT 0.7 02/10/2015 0940       RADIOGRAPHIC STUDIES: Dg Chest 2 View  07/28/2015  CLINICAL DATA:  Amiodarone therapy, asymptomatic EXAM: CHEST  2 VIEW COMPARISON:  PA and lateral chest x-ray of December 26, 2014 FINDINGS: The lungs are adequately inflated and clear. The heart and pulmonary vascularity are within the limits of normal. There is tortuosity of the descending thoracic aorta. There is no pleural effusion. There is mild multilevel degenerative disc disease of the thoracic spine.  There are surgical clips in the region of the GE junction. IMPRESSION: There is no active cardiopulmonary disease. Electronically Signed   By: David  Martinique M.D.   On: 07/28/2015 15:59    ASSESSMENT AND PLAN: This is a very pleasant 80 years old white male with IgG smoldering multiple myeloma diagnosed in April 2011. The patient is doing fine and he has no evidence of end organ damage except for the chronic renal insufficiency. His multiple myeloma today is unremarkable for any disease progression. I discussed the lab result with the patient and his wife today. I recommended for him to continue on observation. For the insufficiency and hypertension, I advised the patient to reconsult with his nephrologist. I will see the patient back for follow-up visit in 6 months with repeat myeloma panel for reevaluation of his disease. He was advised to call immediately if he has any concerning symptoms in the interval. The  patient voices understanding of current disease status and treatment options and is in agreement with the current care plan.  All questions were answered. The patient knows to call the clinic with any problems, questions or concerns. We can certainly see the patient much sooner if necessary.  Disclaimer: This note was dictated with voice recognition software. Similar sounding words can inadvertently be transcribed and may not be corrected upon review.

## 2015-08-24 NOTE — Telephone Encounter (Signed)
Gave and printed appt sched and avs fo rpt for may 2017 °

## 2015-09-15 ENCOUNTER — Other Ambulatory Visit: Payer: Self-pay | Admitting: Interventional Cardiology

## 2015-09-15 ENCOUNTER — Encounter: Payer: Self-pay | Admitting: Interventional Cardiology

## 2015-09-15 ENCOUNTER — Ambulatory Visit (INDEPENDENT_AMBULATORY_CARE_PROVIDER_SITE_OTHER): Payer: Medicare Other | Admitting: Interventional Cardiology

## 2015-09-15 VITALS — BP 164/66 | HR 55 | Ht 66.0 in | Wt 208.0 lb

## 2015-09-15 DIAGNOSIS — Z79899 Other long term (current) drug therapy: Secondary | ICD-10-CM

## 2015-09-15 DIAGNOSIS — I495 Sick sinus syndrome: Secondary | ICD-10-CM | POA: Diagnosis not present

## 2015-09-15 DIAGNOSIS — I5032 Chronic diastolic (congestive) heart failure: Secondary | ICD-10-CM

## 2015-09-15 DIAGNOSIS — Z9989 Dependence on other enabling machines and devices: Secondary | ICD-10-CM

## 2015-09-15 DIAGNOSIS — I480 Paroxysmal atrial fibrillation: Secondary | ICD-10-CM

## 2015-09-15 DIAGNOSIS — I10 Essential (primary) hypertension: Secondary | ICD-10-CM

## 2015-09-15 DIAGNOSIS — G4733 Obstructive sleep apnea (adult) (pediatric): Secondary | ICD-10-CM

## 2015-09-15 LAB — TSH: TSH: 3.54 u[IU]/mL (ref 0.350–4.500)

## 2015-09-15 NOTE — Patient Instructions (Signed)
Medication Instructions:  Your physician recommends that you continue on your current medications as directed. Please refer to the Current Medication list given to you today.   Labwork: Tsh today  Tsh and Hepatic in 6 months  Testing/Procedures: None ordered  Follow-Up: Your physician wants you to follow-up in: 6 months with Dr.Smith You will receive a reminder letter in the mail two months in advance. If you don't receive a letter, please call our office to schedule the follow-up appointment.   Any Other Special Instructions Will Be Listed Below (If Applicable).     If you need a refill on your cardiac medications before your next appointment, please call your pharmacy.

## 2015-09-15 NOTE — Progress Notes (Signed)
Cardiology Office Note   Date:  09/15/2015   ID:  Thomas [name], DOB 22-Aug-1935, MRN [id number]  PCP:  [name], MD  Cardiologist:  [name], MD   Chief Complaint  Patient presents with  . Atrial Fibrillation      History of Present Illness: Thomas [name] is a 80 y.o. male who presents for  Bleeding on anticoagulation therapy, recurrent atrial fibrillation, amiodarone therapy for rhythm control of atrial fibrillation, hypertension, chronic kidney disease, and history of sleep apnea.   Still having intermittent palpitations. At times these episodes can last up to 30 minutes a longer period he feels irregularity in the heart rhythm. We are unable to use anticoagulation therapy because of prior bleeding complications, mostly GU.    He has not had angina. He denies syncope. He is using  C Pap sparingly.   He is accompanied by his daughter today. Thomas [name] sufferedpulmonary emboli  In the past 3 weeks. She is recuperating at home.    Past Medical History  Diagnosis Date  . Hypertension   . Hypercholesterolemia   . Chronic kidney disease     Hx of bilateral renal cysts.  . History of atrial fibrillation 03/09/09  . Mild aortic stenosis   . Glaucoma   . BPH (benign prostatic hypertrophy)   . Smoldering multiple myeloma ([place])   . Prostate cancer ([place]) 7/12    T1C Ad Ca  s/p TURP, observation only  . Hx of epiglottitis     2011  . Heart murmur   . Sleep apnea     Past Surgical History  Procedure Laterality Date  . Adrenalectomy  02/12/11    Left, for Pheochromocytoma  . Cholecystectomy  02/12/11  . Gastrectomy  1984    S/P gastrectomy for ulcer  . Knee surgery  1971    S/P Right knee surgery  . Transurethral resection of prostate  7/12  . Tee without cardioversion N/A 12/26/2013    Procedure: TRANSESOPHAGEAL ECHOCARDIOGRAM (TEE);  Surgeon: [name], MD;  Location: [facility];  Service: Cardiovascular;  Laterality: N/A;  . Cardioversion  N/A 12/26/2013    Procedure: CARDIOVERSION;  Surgeon: [name], MD;  Location: [facility] ENDOSCOPY;  Service: Cardiovascular;  Laterality: N/A;     Current Outpatient Prescriptions  Medication Sig Dispense Refill  . amiodarone (PACERONE) 200 MG tablet Take 200 mg by mouth daily.     Marland Kitchen amLODipine (NORVASC) 5 MG tablet Take 1 tablet (5 mg total) by mouth daily. 30 tablet 11  . aspirin 81 MG tablet Take 81 mg by mouth daily.    . brimonidine-timolol (COMBIGAN) 0.2-0.5 % ophthalmic solution Place 1 drop into both eyes 2 (two) times daily.     . Cholecalciferol (VITAMIN D3) 1000 UNITS CAPS Take 1 capsule by mouth daily.     . clobetasol cream (TEMOVATE) 5.17 % Apply 1 application topically 2 (two) times daily as needed (ITCHING).    Marland Kitchen levothyroxine (SYNTHROID, LEVOTHROID) 50 MCG tablet Take 1 tablet (50 mcg total) by mouth daily before breakfast. 30 tablet 9  . TRAVATAN Z 0.004 % SOLN ophthalmic solution Place 1 drop into both eyes at bedtime.     No current facility-administered medications for this visit.    Allergies:   Benadryl; Ceftriaxone sodium; Contrast media; Nsaids; Penicillins; and Levaquin    Social History:  The patient  reports that he has never smoked. He has never used smokeless tobacco. He reports that he does not drink  alcohol or use illicit drugs.   Family History:  The patient's family history includes Kidney Stones in his brother; Liver disease in his sister, sister, and sister; Melanoma in his brother; Polymyositis in his mother; Prostate cancer in his brother and father.    ROS:  Please see the history of present illness.   Otherwise, review of systems are positive for  Palpitations as noted above. Monitor his blood pressure at home and feels he runs usually between 742 595 mmHg systolic and 63-87 millimeters mercury diastolic. No blood in urine or stool. No head trauma..   All other systems are reviewed and negative.    PHYSICAL EXAM: VS:  BP 164/66 mmHg  Pulse 55  Ht  5' 6"  (1.676 m)  Wt 208 lb (94.348 kg)  BMI 33.59 kg/m2 , BMI Body mass index is 33.59 kg/(m^2). GEN: Well nourished, well developed, in no acute distress HEENT: normal Neck: no JVD, carotid bruits, or masses Cardiac: RRR.  There is  2 of 6 right upper sternal crescendo decrescendo systolic murmur  But rub, or gallop. There is  no edema. Respiratory:  clear to auscultation bilaterally, normal work of breathing. GI: soft, nontender, nondistended, + BS MS: no deformity or atrophy Skin: warm and dry, no rash Neuro:  Strength and sensation are intact Psych: euthymic mood, full affect   EKG:  EKG is ordered today. The ekg reveals  Sinus bradycardia , first-degree AV block, nonspecific T wave flattening.   Recent Labs: 03/23/2015: TSH 4.34 07/29/2015: ALT 26; BUN 23.1; Creatinine 2.1*; HGB 15.7; Platelets 195; Potassium 4.4; Sodium 139    Lipid Panel No results found for: CHOL, TRIG, HDL, CHOLHDL, VLDL, LDLCALC, LDLDIRECT    Wt Readings from Last 3 Encounters:  09/15/15 208 lb (94.348 kg)  08/24/15 209 lb 9.6 oz (95.074 kg)  02/10/15 207 lb 9.6 oz (94.167 kg)      Other studies Reviewed: Additional studies/ records that were reviewed today include:  Review the electronic medical records.. The findings include  Blood work that included a  Comprehensive metabolic panel was done in follow-up of multiple myeloma. Was done in October. No TSH was checked. Other radiographic studies were reviewed to look for any evidence of aortic disease. Current chest x-ray demonstrated aortic tortuosity there is no evidence of an aneurysm on any of his current radiologic studies.    ASSESSMENT AND PLAN:  1. Sinus node dysfunction (HCC)  Asymptomatic  2. Paroxysmal atrial fibrillation (HCC)  Probably still having brief episodes. Unable to anticoagulate because of recurrent bleeding 2. Mostly GU bleeding.  3. OSA on CPAP  treated but not very compliant  4. Essential hypertension   controlled  5. On amiodarone therapy  no evidence of toxicity  6. Chronic diastolic heart failure (HCC)  no evidence of volume overload    Current medicines are reviewed at length with the patient today.  The patient has the following concerns regarding medicines: none.  The following changes/actions have been instituted:     continue medications as listed   Clinical follow-up in 6 months   TSH today   Hepatic panel and TSH on return in 6 months   Reviewed recent chest x-ray which revealed aortic tortuosity. Consider repeat cardiac echocardiogram to evaluate aortic root size.  Labs/ tests ordered today include:   Orders Placed This Encounter  Procedures  . TSH  . EKG 12-Lead     Disposition:   FU with HS in 6 months  Signed, [name], MD  09/15/2015 5:36 PM    [facility] [place], [place], [place]  [address] Phone: [phone]; Fax: [phone]

## 2015-09-18 ENCOUNTER — Ambulatory Visit: Payer: Medicare Other | Admitting: Interventional Cardiology

## 2016-01-10 ENCOUNTER — Other Ambulatory Visit: Payer: Self-pay | Admitting: Interventional Cardiology

## 2016-01-11 NOTE — Telephone Encounter (Signed)
Ok to refill 

## 2016-02-15 ENCOUNTER — Other Ambulatory Visit (HOSPITAL_BASED_OUTPATIENT_CLINIC_OR_DEPARTMENT_OTHER): Payer: Medicare Other

## 2016-02-15 DIAGNOSIS — C9000 Multiple myeloma not having achieved remission: Secondary | ICD-10-CM | POA: Diagnosis not present

## 2016-02-15 DIAGNOSIS — N289 Disorder of kidney and ureter, unspecified: Secondary | ICD-10-CM

## 2016-02-15 LAB — CBC WITH DIFFERENTIAL/PLATELET
BASO%: 0.2 % (ref 0.0–2.0)
Basophils Absolute: 0 10*3/uL (ref 0.0–0.1)
EOS%: 1.7 % (ref 0.0–7.0)
Eosinophils Absolute: 0.1 10*3/uL (ref 0.0–0.5)
HCT: 49.7 % (ref 38.4–49.9)
HGB: 16.4 g/dL (ref 13.0–17.1)
LYMPH%: 28.9 % (ref 14.0–49.0)
MCH: 31.4 pg (ref 27.2–33.4)
MCHC: 32.9 g/dL (ref 32.0–36.0)
MCV: 95.2 fL (ref 79.3–98.0)
MONO#: 0.7 10*3/uL (ref 0.1–0.9)
MONO%: 10.3 % (ref 0.0–14.0)
NEUT#: 3.9 10*3/uL (ref 1.5–6.5)
NEUT%: 58.9 % (ref 39.0–75.0)
Platelets: 207 10*3/uL (ref 140–400)
RBC: 5.22 10*6/uL (ref 4.20–5.82)
RDW: 14.6 % (ref 11.0–14.6)
WBC: 6.6 10*3/uL (ref 4.0–10.3)
lymph#: 1.9 10*3/uL (ref 0.9–3.3)

## 2016-02-15 LAB — COMPREHENSIVE METABOLIC PANEL
ALT: 27 U/L (ref 0–55)
AST: 24 U/L (ref 5–34)
Albumin: 4 g/dL (ref 3.5–5.0)
Alkaline Phosphatase: 78 U/L (ref 40–150)
Anion Gap: 4 mEq/L (ref 3–11)
BUN: 18.7 mg/dL (ref 7.0–26.0)
CO2: 25 mEq/L (ref 22–29)
Calcium: 9.8 mg/dL (ref 8.4–10.4)
Chloride: 110 mEq/L — ABNORMAL HIGH (ref 98–109)
Creatinine: 2.1 mg/dL — ABNORMAL HIGH (ref 0.7–1.3)
EGFR: 29 mL/min/{1.73_m2} — ABNORMAL LOW (ref >90)
Glucose: 100 mg/dl (ref 70–140)
Potassium: 4.1 mEq/L (ref 3.5–5.1)
Sodium: 139 mEq/L (ref 136–145)
Total Bilirubin: 1.08 mg/dL (ref 0.20–1.20)
Total Protein: 8.1 g/dL (ref 6.4–8.3)

## 2016-02-15 LAB — LACTATE DEHYDROGENASE: LDH: 198 U/L (ref 125–245)

## 2016-02-16 LAB — IGG, IGA, IGM
IgA, Qn, Serum: 53 mg/dL — ABNORMAL LOW (ref 61–437)
IgG, Qn, Serum: 2367 mg/dL — ABNORMAL HIGH (ref 700–1600)
IgM, Qn, Serum: 22 mg/dL (ref 15–143)

## 2016-02-16 LAB — KAPPA/LAMBDA LIGHT CHAINS
Ig Kappa Free Light Chain: 800.94 mg/L — ABNORMAL HIGH (ref 3.30–19.40)
Ig Lambda Free Light Chain: 12.24 mg/L (ref 5.71–26.30)
Kappa/Lambda FluidC Ratio: 65.44 — ABNORMAL HIGH (ref 0.26–1.65)

## 2016-02-16 LAB — BETA 2 MICROGLOBULIN, SERUM: Beta-2: 2.9 mg/L — ABNORMAL HIGH (ref 0.6–2.4)

## 2016-02-22 ENCOUNTER — Ambulatory Visit (HOSPITAL_BASED_OUTPATIENT_CLINIC_OR_DEPARTMENT_OTHER): Payer: Medicare Other | Admitting: Internal Medicine

## 2016-02-22 ENCOUNTER — Telehealth: Payer: Self-pay | Admitting: Internal Medicine

## 2016-02-22 ENCOUNTER — Encounter: Payer: Self-pay | Admitting: Internal Medicine

## 2016-02-22 VITALS — BP 158/81 | HR 86 | Temp 98.1°F | Resp 18 | Ht 66.0 in | Wt 200.1 lb

## 2016-02-22 DIAGNOSIS — N289 Disorder of kidney and ureter, unspecified: Secondary | ICD-10-CM

## 2016-02-22 DIAGNOSIS — C9000 Multiple myeloma not having achieved remission: Secondary | ICD-10-CM | POA: Diagnosis not present

## 2016-02-22 NOTE — Progress Notes (Signed)
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 2) history of pheochromocytoma LEFT adrenal gland status post resection on 03/16/2009  PRIOR THERAPY: None  CURRENT THERAPY: Observation  INTERVAL HISTORY: Thomas [name] 81 y.o. male returns to the clinic today for six-month follow-up visit accompanied by his wife. The patient is feeling fine today with no specific complaints. He denied having any significant weight loss or night sweats. He has no chest pain, shortness of breath, cough or hemoptysis. The patient denied having any significant nausea or vomiting. He is followed by Dr. Moshe Cipro for his renal insufficiency and no significant changes recently. The patient had repeat myeloma panel performed recently and he is here for evaluation and discussion of his lab results.  MEDICAL HISTORY: Past Medical History  Diagnosis Date  . Hypertension   . Hypercholesterolemia   . Chronic kidney disease     Hx of bilateral renal cysts.  . History of atrial fibrillation 03/09/09  . Mild aortic stenosis   . Glaucoma   . BPH (benign prostatic hypertrophy)   . Smoldering multiple myeloma ([place])   . Prostate cancer ([place]) 7/12    T1C Ad Ca  s/p TURP, observation only  . Hx of epiglottitis     2011  . Heart murmur   . Sleep apnea     ALLERGIES:  is allergic to benadryl; ceftriaxone sodium; contrast media; nsaids; penicillins; and levaquin.  MEDICATIONS:  Current Outpatient Prescriptions  Medication Sig Dispense Refill  . amiodarone (PACERONE) 200 MG tablet Take 200 mg by mouth daily.     Marland Kitchen amLODipine (NORVASC) 5 MG tablet Take 1 tablet (5 mg total) by mouth daily. 30 tablet 11  . aspirin 81 MG tablet Take 81 mg by mouth daily.    .  brimonidine-timolol (COMBIGAN) 0.2-0.5 % ophthalmic solution Place 1 drop into both eyes 2 (two) times daily.     . Cholecalciferol (VITAMIN D3) 1000 UNITS CAPS Take 1 capsule by mouth daily.     . clobetasol cream (TEMOVATE) 7.74 % Apply 1 application topically 2 (two) times daily as needed (ITCHING).    Marland Kitchen levothyroxine (SYNTHROID, LEVOTHROID) 50 MCG tablet TAKE 1 TABLET BY MOUTH EVERY DAY BEFORE BREAKFAST 30 tablet 2  . TRAVATAN Z 0.004 % SOLN ophthalmic solution Place 1 drop into both eyes at bedtime.     No current facility-administered medications for this visit.    SURGICAL HISTORY:  Past Surgical History  Procedure Laterality Date  . Adrenalectomy  02/12/11    Left, for Pheochromocytoma  . Cholecystectomy  02/12/11  . Gastrectomy  1984    S/P gastrectomy for ulcer  . Knee surgery  1971    S/P Right knee surgery  . Transurethral resection of prostate  7/12  . Tee without cardioversion N/A 12/26/2013    Procedure: TRANSESOPHAGEAL ECHOCARDIOGRAM (TEE);  Surgeon: [name], MD;  Location: [facility];  Service: Cardiovascular;  Laterality: N/A;  . Cardioversion N/A 12/26/2013    Procedure: CARDIOVERSION;  Surgeon: [name], MD;  Location: [facility] ENDOSCOPY;  Service: Cardiovascular;  Laterality: N/A;    REVIEW OF SYSTEMS:  A comprehensive review of systems was negative.   PHYSICAL EXAMINATION: General appearance: alert, cooperative and no distress Head: Normocephalic, without obvious abnormality,  atraumatic Neck: no adenopathy, no JVD, supple, symmetrical, trachea midline and thyroid not enlarged, symmetric, no tenderness/mass/nodules Lymph nodes: Cervical, supraclavicular, and axillary nodes normal. Resp: clear to auscultation bilaterally Back: symmetric, no curvature. ROM normal. No CVA tenderness. Cardio: regular rate and rhythm, S1, S2 normal, no murmur, click, rub or gallop GI: soft, non-tender; bowel sounds normal; no masses,  no organomegaly Extremities: extremities  normal, atraumatic, no cyanosis or edema Neurologic: Alert and oriented X 3, normal strength and tone. Normal symmetric reflexes. Normal coordination and gait  ECOG PERFORMANCE STATUS: 1 - Symptomatic but completely ambulatory  Blood pressure 158/81, pulse 86, temperature 98.1 F (36.7 C), temperature source Oral, resp. rate 18, height 5' 6" (1.676 m), weight 200 lb 1.6 oz (90.765 kg), SpO2 99 %.  LABORATORY DATA: Lab Results  Component Value Date   WBC 6.6 02/15/2016   HGB 16.4 02/15/2016   HCT 49.7 02/15/2016   MCV 95.2 02/15/2016   PLT 207 02/15/2016      Chemistry      Component Value Date/Time   NA 139 02/15/2016 1108   NA 136 01/10/2014 0841   K 4.1 02/15/2016 1108   K 4.4 01/10/2014 0841   CL 103 01/10/2014 0841   CL 109* 03/21/2013 0958   CO2 25 02/15/2016 1108   CO2 28 01/10/2014 0841   BUN 18.7 02/15/2016 1108   BUN 21 01/10/2014 0841   CREATININE 2.1* 02/15/2016 1108   CREATININE 2.0* 01/10/2014 0841      Component Value Date/Time   CALCIUM 9.8 02/15/2016 1108   CALCIUM 10.0 01/10/2014 0841   ALKPHOS 78 02/15/2016 1108   ALKPHOS 79 02/10/2015 0940   AST 24 02/15/2016 1108   AST 25 02/10/2015 0940   ALT 27 02/15/2016 1108   ALT 24 02/10/2015 0940   BILITOT 1.08 02/15/2016 1108   BILITOT 0.7 02/10/2015 0940       RADIOGRAPHIC STUDIES: No results found.  ASSESSMENT AND PLAN: This is a very pleasant 81 years old white male with IgG smoldering multiple myeloma diagnosed in April 2011. The patient is doing fine and he has no evidence of end organ damage except for the chronic renal insufficiency. His multiple myeloma today is unremarkable for any disease progression. I discussed the lab result with the patient and his wife today. I recommended for him to continue on observation. For the insufficiency and hypertension, He will continue his follow-up visit and evaluation by nephrology. I will see the patient back for follow-up visit in 6 months with repeat  myeloma panel for reevaluation of his disease. He was advised to call immediately if he has any concerning symptoms in the interval. The patient voices understanding of current disease status and treatment options and is in agreement with the current care plan.  All questions were answered. The patient knows to call the clinic with any problems, questions or concerns. We can certainly see the patient much sooner if necessary.  Disclaimer: This note was dictated with voice recognition software. Similar sounding words can inadvertently be transcribed and may not be corrected upon review.       

## 2016-02-22 NOTE — Telephone Encounter (Signed)
Gave and printed appt sched and avs fo rpt for May °

## 2016-03-22 NOTE — Progress Notes (Signed)
Cardiology Office Note    Date:  03/23/2016   ID:  Thomas [name] [birth year], [name]  PCP:  Thomas Schwalbe, MD  Cardiologist: Thomas Grooms, MD   Chief Complaint  Patient presents with  . Atrial Fibrillation    History of Present Illness:  Thomas [name] is a 81 y.o. male paroxysmal atrial fibrillation, chronic diastolic heart failure, hypertension, chronic kidney disease, unable to use anticoagulation therapy due to recurrent GU bleeding, and mild aortic stenosis.  His wife tells me the he had an episode of atrial fibrillation and lasted at least 96 hours. A doubled up on amiodarone. They did not call us about the episode. He has had no neurological complaints. He denies chest discomfort. Atrial fibrillation causes excessive fatigue and dyspnea.  Past Medical History  Diagnosis Date  . Hypertension   . Hypercholesterolemia   . Chronic kidney disease     Hx of bilateral renal cysts.  . History of atrial fibrillation 03/09/09  . Mild aortic stenosis   . Glaucoma   . BPH (benign prostatic hypertrophy)   . Smoldering multiple myeloma ([place])   . Prostate cancer ([place]) 7/12    T1C Ad Ca  s/p TURP, observation only  . Hx of epiglottitis     2011  . Heart murmur   . Sleep apnea     Past Surgical History  Procedure Laterality Date  . Adrenalectomy  02/12/11    Left, for Pheochromocytoma  . Cholecystectomy  02/12/11  . Gastrectomy  1984    S/P gastrectomy for ulcer  . Knee surgery  1971    S/P Right knee surgery  . Transurethral resection of prostate  7/12  . Tee without cardioversion N/A 12/26/2013    Procedure: TRANSESOPHAGEAL ECHOCARDIOGRAM (TEE);  Surgeon: Thomas Records, MD;  Location: [facility];  Service: Cardiovascular;  Laterality: N/A;  . Cardioversion N/A 12/26/2013    Procedure: CARDIOVERSION;  Surgeon: Thomas Records, MD;  Location: [facility] ENDOSCOPY;  Service: Cardiovascular;  Laterality: N/A;    Current Medications: Outpatient Prescriptions  Prior to Visit  Medication Sig Dispense Refill  . amiodarone (PACERONE) 200 MG tablet Take 200 mg by mouth daily.     Marland Kitchen amLODipine (NORVASC) 5 MG tablet Take 1 tablet (5 mg total) by mouth daily. 30 tablet 11  . aspirin 81 MG tablet Take 81 mg by mouth daily.    . brimonidine-timolol (COMBIGAN) 0.2-0.5 % ophthalmic solution Place 1 drop into both eyes 2 (two) times daily.     . Cholecalciferol (VITAMIN D3) 1000 UNITS CAPS Take 1 capsule by mouth daily.     . clobetasol cream (TEMOVATE) 4.19 % Apply 1 application topically 2 (two) times daily as needed (ITCHING).    Marland Kitchen levothyroxine (SYNTHROID, LEVOTHROID) 50 MCG tablet TAKE 1 TABLET BY MOUTH EVERY DAY BEFORE BREAKFAST 30 tablet 2  . TRAVATAN Z 0.004 % SOLN ophthalmic solution Place 1 drop into both eyes at bedtime.     No facility-administered medications prior to visit.     Allergies:   Benadryl; Ceftriaxone sodium; Contrast media; Nsaids; Penicillins; and Levaquin   Social History   Social History  . Marital Status: Married    Spouse Name: N/A  . Number of Children: 3  . Years of Education: N/A   Occupational History  .     Social History Main Topics  . Smoking status: Never Smoker   . Smokeless tobacco: Never Used  . Alcohol Use: No  . Drug  Use: No  . Sexual Activity: Not Currently   Other Topics Concern  . None   Social History Narrative     Family History:  The patient's family history includes Kidney Stones in his brother; Liver disease in his sister, sister, and sister; Melanoma in his brother; Polymyositis in his mother; Prostate cancer in his brother and father.   ROS:   Please see the history of present illness.    Weight gain and dyspnea on exertion.   All other systems reviewed and are negative.   PHYSICAL EXAM:   VS:  BP 156/74 mmHg  Pulse 55  Ht 5' 6"  (1.676 m)  Wt 196 lb 12.8 oz (89.268 kg)  BMI 31.78 kg/m2   GEN: Well nourished, well developed, in no acute distress HEENT: normal Neck: no JVD,  carotid bruits, or masses Cardiac: Clinically in normal sinus rhythm with RRR, rubs, or gallops,no edema . There is a grade 3/6 crescendo decrescendo systolic murmur of aortic stenosis. Respiratory:  clear to auscultation bilaterally, normal work of breathing GI: soft, nontender, nondistended, + BS MS: no deformity or atrophy Skin: warm and dry, no rash Neuro:  Alert and Oriented x 3, Strength and sensation are intact Psych: euthymic mood, full affect  Wt Readings from Last 3 Encounters:  03/23/16 196 lb 12.8 oz (89.268 kg)  02/22/16 200 lb 1.6 oz (90.765 kg)  09/15/15 208 lb (94.348 kg)      Studies/Labs Reviewed:   EKG:  EKG  No new studies performed  Recent Labs: 09/15/2015: TSH 3.540 02/15/2016: ALT 27; BUN 18.7; Creatinine 2.1*; HGB 16.4; Platelets 207; Potassium 4.1; Sodium 139   Lipid Panel No results found for: CHOL, TRIG, HDL, CHOLHDL, VLDL, LDLCALC, LDLDIRECT  Additional studies/ [name] that were reviewed today include:   No recent functional testing.    ASSESSMENT:    1. Paroxysmal atrial fibrillation (HCC)   2. Chronic diastolic heart failure ([place])   3. Essential hypertension   4. On amiodarone therapy   5. Mild aortic stenosis      PLAN:  In order of problems listed above:  1. On return in 6 months he will have a TSH and hepatic panel. He had one prolonged episode of atrial fibrillation about 4 weeks ago where the heart was out of rhythm for a 96 hours. He is unable take anticoagulation due to recurrent urologic bleeding complications. We discussed ablation and Watchman as possible treatment additions. For the time being we will continue to monitor his current clinical status on simple drug therapy with amiodarone. 2. There is no evidence of volume overload. Cautioned to call us if dyspnea. 3. Low-salt diet is recommended 4. TSH and hepatic panel will be performed in 6 months on clinical follow-up. No evidence of toxicity is currently present. 5. He will  need to have a repeat echocardiogram performed    Medication Adjustments/Labs and Tests Ordered: Current medicines are reviewed at length with the patient today.  Concerns regarding medicines are outlined above.  Medication changes, Labs and Tests ordered today are listed in the Patient Instructions below. Patient Instructions  Medication Instructions:   NO CHANGE  Labwork:  Your physician recommends that you HAVE LAB WORK TODAY  Follow-Up:  Your physician wants you to follow-up in: [place] will receive a reminder letter in the mail two months in advance. If you don't receive a letter, please call our office to schedule the follow-up appointment.   ABLATION OR WATCHMAN ARE AVAILABLE TREATMENT  FOR ATRIAL FIB       Signed, Thomas Grooms, MD  03/23/2016 1:25 PM    [facility] [place], [place], [place]  [address] Phone: [phone]; Fax: [phone]

## 2016-03-23 ENCOUNTER — Encounter: Payer: Self-pay | Admitting: Interventional Cardiology

## 2016-03-23 ENCOUNTER — Ambulatory Visit (INDEPENDENT_AMBULATORY_CARE_PROVIDER_SITE_OTHER): Payer: Medicare Other | Admitting: Interventional Cardiology

## 2016-03-23 VITALS — BP 156/74 | HR 55 | Ht 66.0 in | Wt 196.8 lb

## 2016-03-23 DIAGNOSIS — Z79899 Other long term (current) drug therapy: Secondary | ICD-10-CM

## 2016-03-23 DIAGNOSIS — I350 Nonrheumatic aortic (valve) stenosis: Secondary | ICD-10-CM

## 2016-03-23 DIAGNOSIS — I5032 Chronic diastolic (congestive) heart failure: Secondary | ICD-10-CM | POA: Diagnosis not present

## 2016-03-23 DIAGNOSIS — I480 Paroxysmal atrial fibrillation: Secondary | ICD-10-CM

## 2016-03-23 DIAGNOSIS — I10 Essential (primary) hypertension: Secondary | ICD-10-CM | POA: Diagnosis not present

## 2016-03-23 LAB — HEPATIC FUNCTION PANEL
ALT: 26 U/L (ref 9–46)
AST: 28 U/L (ref 10–35)
Albumin: 4.1 g/dL (ref 3.6–5.1)
Alkaline Phosphatase: 71 U/L (ref 40–115)
Bilirubin, Direct: 0.1 mg/dL (ref <=0.2)
Indirect Bilirubin: 0.7 mg/dL (ref 0.2–1.2)
Total Bilirubin: 0.8 mg/dL (ref 0.2–1.2)
Total Protein: 7.6 g/dL (ref 6.1–8.1)

## 2016-03-23 LAB — TSH: TSH: 2.61 mIU/L (ref 0.40–4.50)

## 2016-03-23 NOTE — Patient Instructions (Signed)
Medication Instructions:   NO CHANGE  Labwork:  Your physician recommends that you HAVE LAB WORK TODAY  Follow-Up:  Your physician wants you to follow-up in: [place] will receive a reminder letter in the mail two months in advance. If you don't receive a letter, please call our office to schedule the follow-up appointment.   ABLATION OR WATCHMAN ARE AVAILABLE TREATMENT FOR ATRIAL FIB

## 2016-04-04 ENCOUNTER — Other Ambulatory Visit: Payer: Self-pay | Admitting: *Deleted

## 2016-04-04 MED ORDER — LEVOTHYROXINE SODIUM 50 MCG PO TABS: 50.0000 ug | ORAL_TABLET | Freq: Every day | ORAL | Status: DC

## 2016-06-30 ENCOUNTER — Other Ambulatory Visit: Payer: Self-pay | Admitting: Interventional Cardiology

## 2016-08-08 ENCOUNTER — Other Ambulatory Visit (HOSPITAL_BASED_OUTPATIENT_CLINIC_OR_DEPARTMENT_OTHER): Payer: Medicare Other

## 2016-08-08 DIAGNOSIS — N289 Disorder of kidney and ureter, unspecified: Secondary | ICD-10-CM

## 2016-08-08 DIAGNOSIS — C9000 Multiple myeloma not having achieved remission: Secondary | ICD-10-CM

## 2016-08-08 LAB — COMPREHENSIVE METABOLIC PANEL
ALT: 29 U/L (ref 0–55)
AST: 26 U/L (ref 5–34)
Albumin: 3.7 g/dL (ref 3.5–5.0)
Alkaline Phosphatase: 93 U/L (ref 40–150)
Anion Gap: 5 mEq/L (ref 3–11)
BUN: 19.6 mg/dL (ref 7.0–26.0)
CO2: 26 mEq/L (ref 22–29)
Calcium: 9.7 mg/dL (ref 8.4–10.4)
Chloride: 108 mEq/L (ref 98–109)
Creatinine: 2 mg/dL — ABNORMAL HIGH (ref 0.7–1.3)
EGFR: 31 mL/min/{1.73_m2} — ABNORMAL LOW (ref >90)
Glucose: 89 mg/dl (ref 70–140)
Potassium: 4.6 mEq/L (ref 3.5–5.1)
Sodium: 138 mEq/L (ref 136–145)
Total Bilirubin: 0.52 mg/dL (ref 0.20–1.20)
Total Protein: 8 g/dL (ref 6.4–8.3)

## 2016-08-08 LAB — CBC WITH DIFFERENTIAL/PLATELET
BASO%: 0.1 % (ref 0.0–2.0)
Basophils Absolute: 0 10*3/uL (ref 0.0–0.1)
EOS%: 2.7 % (ref 0.0–7.0)
Eosinophils Absolute: 0.2 10*3/uL (ref 0.0–0.5)
HCT: 46.5 % (ref 38.4–49.9)
HGB: 15.6 g/dL (ref 13.0–17.1)
LYMPH%: 31.9 % (ref 14.0–49.0)
MCH: 31.8 pg (ref 27.2–33.4)
MCHC: 33.6 g/dL (ref 32.0–36.0)
MCV: 94.8 fL (ref 79.3–98.0)
MONO#: 0.6 10*3/uL (ref 0.1–0.9)
MONO%: 9 % (ref 0.0–14.0)
NEUT#: 3.8 10*3/uL (ref 1.5–6.5)
NEUT%: 56.3 % (ref 39.0–75.0)
Platelets: 199 10*3/uL (ref 140–400)
RBC: 4.91 10*6/uL (ref 4.20–5.82)
RDW: 14.6 % (ref 11.0–14.6)
WBC: 6.8 10*3/uL (ref 4.0–10.3)
lymph#: 2.2 10*3/uL (ref 0.9–3.3)

## 2016-08-08 LAB — LACTATE DEHYDROGENASE: LDH: 204 U/L (ref 125–245)

## 2016-08-09 LAB — IGG, IGA, IGM
IgA, Qn, Serum: 50 mg/dL — ABNORMAL LOW (ref 61–437)
IgG, Qn, Serum: 2188 mg/dL — ABNORMAL HIGH (ref 700–1600)
IgM, Qn, Serum: 19 mg/dL (ref 15–143)

## 2016-08-09 LAB — BETA 2 MICROGLOBULIN, SERUM: Beta-2: 3 mg/L — ABNORMAL HIGH (ref 0.6–2.4)

## 2016-08-09 LAB — KAPPA/LAMBDA LIGHT CHAINS
Ig Kappa Free Light Chain: 686.1 mg/L — ABNORMAL HIGH (ref 3.3–19.4)
Ig Lambda Free Light Chain: 11.7 mg/L (ref 5.7–26.3)
Kappa/Lambda FluidC Ratio: 58.64 — ABNORMAL HIGH (ref 0.26–1.65)

## 2016-08-15 ENCOUNTER — Encounter: Payer: Self-pay | Admitting: Internal Medicine

## 2016-08-15 ENCOUNTER — Ambulatory Visit (HOSPITAL_BASED_OUTPATIENT_CLINIC_OR_DEPARTMENT_OTHER): Payer: Medicare Other | Admitting: Internal Medicine

## 2016-08-15 ENCOUNTER — Telehealth: Payer: Self-pay | Admitting: Internal Medicine

## 2016-08-15 VITALS — BP 158/68 | HR 55 | Temp 97.6°F | Resp 17 | Ht 66.0 in | Wt 199.6 lb

## 2016-08-15 DIAGNOSIS — C9000 Multiple myeloma not having achieved remission: Secondary | ICD-10-CM | POA: Diagnosis not present

## 2016-08-15 NOTE — Telephone Encounter (Signed)
Appointments scheduled per 11/13 LOS. Patient given AVS report and calendars with future scheduled appointments. Patient requested to have lab wk before MD. Didn't want lab scheduled on the same day.

## 2016-08-15 NOTE — Progress Notes (Signed)
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 2) history of pheochromocytoma LEFT adrenal gland status post resection on 03/16/2009  PRIOR THERAPY: None  CURRENT THERAPY: Observation  INTERVAL HISTORY: Thomas [name] 81 y.o. male returns to the clinic today for six-month follow-up visit accompanied by his wife. The patient is feeling fine today with no specific complaints except for aching pain. He denied having any significant weight loss or night sweats. He has no chest pain, shortness of breath, cough or hemoptysis. The patient denied having any significant nausea or vomiting. The patient had repeat myeloma panel performed recently and he is here for evaluation and discussion of his lab results.  MEDICAL HISTORY: Past Medical History:  Diagnosis Date  . BPH (benign prostatic hypertrophy)   . Chronic kidney disease    Hx of bilateral renal cysts.  . Glaucoma   . Heart murmur   . History of atrial fibrillation 03/09/09  . Hx of epiglottitis    2011  . Hypercholesterolemia   . Hypertension   . Mild aortic stenosis   . Prostate cancer ([place]) 7/12   T1C Ad Ca  s/p TURP, observation only  . Sleep apnea   . Smoldering multiple myeloma (HCC)     ALLERGIES:  is allergic to benadryl [diphenhydramine hcl]; ceftriaxone sodium; contrast media [iodinated diagnostic agents]; nsaids; penicillins; and levaquin [levofloxacin].  MEDICATIONS:  Current Outpatient Prescriptions  Medication Sig Dispense Refill  . amiodarone (PACERONE) 200 MG tablet Take 200 mg by mouth daily.     Marland Kitchen amLODipine (NORVASC) 5 MG tablet TAKE 1 TABLET BY MOUTH EVERY DAY 30 tablet 9  . aspirin 81 MG tablet Take 81 mg by mouth daily.    . brimonidine-timolol  (COMBIGAN) 0.2-0.5 % ophthalmic solution Place 1 drop into both eyes 2 (two) times daily.     . Cholecalciferol (VITAMIN D3) 1000 UNITS CAPS Take 1 capsule by mouth daily.     . clobetasol cream (TEMOVATE) 8.67 % Apply 1 application topically 2 (two) times daily as needed (ITCHING).    Marland Kitchen levothyroxine (SYNTHROID, LEVOTHROID) 50 MCG tablet Take 1 tablet (50 mcg total) by mouth daily before breakfast. 30 tablet 11  . TRAVATAN Z 0.004 % SOLN ophthalmic solution Place 1 drop into both eyes at bedtime.     No current facility-administered medications for this visit.     SURGICAL HISTORY:  Past Surgical History:  Procedure Laterality Date  . ADRENALECTOMY  02/12/11   Left, for Pheochromocytoma  . CARDIOVERSION N/A 12/26/2013   Procedure: CARDIOVERSION;  Surgeon: [name], MD;  Location: [facility];  Service: Cardiovascular;  Laterality: N/A;  . CHOLECYSTECTOMY  02/12/11  . GASTRECTOMY  1984   S/P gastrectomy for ulcer  . KNEE SURGERY  1971   S/P Right knee surgery  . TEE WITHOUT CARDIOVERSION N/A 12/26/2013   Procedure: TRANSESOPHAGEAL ECHOCARDIOGRAM (TEE);  Surgeon: [name], MD;  Location: [facility];  Service: Cardiovascular;  Laterality: N/A;  . TRANSURETHRAL RESECTION OF PROSTATE  7/12    REVIEW OF SYSTEMS:  A comprehensive review of systems was negative except for: Musculoskeletal: positive for arthralgias   PHYSICAL EXAMINATION: General appearance: alert, cooperative and no distress Head: Normocephalic, without obvious abnormality, atraumatic Neck: no adenopathy, no JVD,  supple, symmetrical, trachea midline and thyroid not enlarged, symmetric, no tenderness/mass/nodules Lymph nodes: Cervical, supraclavicular, and axillary nodes normal. Resp: clear to auscultation bilaterally Back: symmetric, no curvature. ROM normal. No CVA tenderness. Cardio: regular rate and rhythm, S1, S2 normal, no murmur, click, rub or gallop GI: soft, non-tender; bowel sounds normal; no masses,  no  organomegaly Extremities: extremities normal, atraumatic, no cyanosis or edema Neurologic: Alert and oriented X 3, normal strength and tone. Normal symmetric reflexes. Normal coordination and gait  ECOG PERFORMANCE STATUS: 1 - Symptomatic but completely ambulatory  There were no vitals taken for this visit.  LABORATORY DATA: Lab Results  Component Value Date   WBC 6.8 08/08/2016   HGB 15.6 08/08/2016   HCT 46.5 08/08/2016   MCV 94.8 08/08/2016   PLT 199 08/08/2016      Chemistry      Component Value Date/Time   NA 138 08/08/2016 1102   K 4.6 08/08/2016 1102   CL 103 01/10/2014 0841   CL 109 (H) 03/21/2013 0958   CO2 26 08/08/2016 1102   BUN 19.6 08/08/2016 1102   CREATININE 2.0 (H) 08/08/2016 1102      Component Value Date/Time   CALCIUM 9.7 08/08/2016 1102   ALKPHOS 93 08/08/2016 1102   AST 26 08/08/2016 1102   ALT 29 08/08/2016 1102   BILITOT 0.52 08/08/2016 1102       RADIOGRAPHIC STUDIES: No results found.  ASSESSMENT AND PLAN: This is a very pleasant 81 years old white male with IgG smoldering multiple myeloma diagnosed in April 2011. The patient is doing fine and he has no evidence of end organ damage except for the chronic renal insufficiency.He is followed by nephrology. His multiple myeloma today is unremarkable for any disease progression. I discussed the lab result with the patient and his wife today. I recommended for him to continue on observation. I will see the patient back for follow-up visit in 6 months with repeat myeloma panel for reevaluation of his disease. He was advised to call immediately if he has any concerning symptoms in the interval. The patient voices understanding of current disease status and treatment options and is in agreement with the current care plan.  All questions were answered. The patient knows to call the clinic with any problems, questions or concerns. We can certainly see the patient much sooner if  necessary.  Disclaimer: This note was dictated with voice recognition software. Similar sounding words can inadvertently be transcribed and may not be corrected upon review.

## 2016-08-28 ENCOUNTER — Other Ambulatory Visit: Payer: Self-pay | Admitting: Interventional Cardiology

## 2016-09-19 ENCOUNTER — Ambulatory Visit (INDEPENDENT_AMBULATORY_CARE_PROVIDER_SITE_OTHER): Payer: Medicare Other | Admitting: Interventional Cardiology

## 2016-09-19 ENCOUNTER — Encounter: Payer: Self-pay | Admitting: Interventional Cardiology

## 2016-09-19 VITALS — BP 172/74 | HR 52 | Ht 65.0 in | Wt 196.2 lb

## 2016-09-19 DIAGNOSIS — G4733 Obstructive sleep apnea (adult) (pediatric): Secondary | ICD-10-CM

## 2016-09-19 DIAGNOSIS — I480 Paroxysmal atrial fibrillation: Secondary | ICD-10-CM

## 2016-09-19 DIAGNOSIS — Z79899 Other long term (current) drug therapy: Secondary | ICD-10-CM

## 2016-09-19 DIAGNOSIS — I350 Nonrheumatic aortic (valve) stenosis: Secondary | ICD-10-CM

## 2016-09-19 DIAGNOSIS — I5032 Chronic diastolic (congestive) heart failure: Secondary | ICD-10-CM

## 2016-09-19 DIAGNOSIS — I495 Sick sinus syndrome: Secondary | ICD-10-CM | POA: Diagnosis not present

## 2016-09-19 DIAGNOSIS — I10 Essential (primary) hypertension: Secondary | ICD-10-CM

## 2016-09-19 DIAGNOSIS — Z9989 Dependence on other enabling machines and devices: Secondary | ICD-10-CM

## 2016-09-19 MED ORDER — FUROSEMIDE 40 MG PO TABS: 40.0000 mg | ORAL_TABLET | Freq: Every day | ORAL | 3 refills | Status: DC

## 2016-09-19 MED ORDER — AMIODARONE HCL 200 MG PO TABS: 100.0000 mg | ORAL_TABLET | Freq: Every day | ORAL | 3 refills | Status: DC

## 2016-09-19 NOTE — Patient Instructions (Addendum)
Medication Instructions:  1) Decrease Amiodarone to 100mg  once daily 2) START Furosemide 40mg  once daily  Labwork: Your physician recommends that you return for lab work in: 7-10 days (BMET, TSH)   Testing/Procedures: None  Follow-Up: Your physician recommends that you schedule a follow-up appointment next available with Dr. Claiborne Billings for sleep follow up.    Your physician wants you to follow-up in: 6 months with Dr. Tamala Julian. You will receive a reminder letter in the mail two months in advance. If you don't receive a letter, please call our office to schedule the follow-up appointment.    Any Other Special Instructions Will Be Listed Below (If Applicable).  Your physician recommends that you monitor your blood pressure.  Your goal is to be 140/90 or less.  Please contact our office if you see that your blood pressure is consistently running higher than this.    If you need a refill on your cardiac medications before your next appointment, please call your pharmacy.

## 2016-09-19 NOTE — Progress Notes (Signed)
Cardiology Office Note    Date:  09/19/2016   ID:  Thomas, [name] 17-Aug-1935, MRN [id number]  PCP:  Thomas Schwalbe, MD  Cardiologist: Thomas Grooms, MD   Chief Complaint  Patient presents with  . Atrial Fibrillation    History of Present Illness:  Thomas [name] is a 81 y.o. male paroxysmal atrial fibrillation, chronic diastolic heart failure, hypertension, chronic kidney disease, unable to use anticoagulation therapy due to recurrent GU bleeding, and mild aortic stenosis.  His blood pressures are running relatively high at home. He received a notice from [place] [facility] that he needed to be on an ACE inhibitor. His creatinine runs between 2 and 2.4. He denies chest pain. He is not sleeping well. He has had no prolonged episodes of palpitation. He is unable to take anticoagulation because of recurrent bleeding from the urinary tract.   Past Medical History:  Diagnosis Date  . BPH (benign prostatic hypertrophy)   . Chronic kidney disease    Hx of bilateral renal cysts.  . Glaucoma   . Heart murmur   . History of atrial fibrillation 03/09/09  . Hx of epiglottitis    2011  . Hypercholesterolemia   . Hypertension   . Mild aortic stenosis   . Prostate cancer (Thomas [name]) 7/12   T1C Ad Ca  s/p TURP, observation only  . Sleep apnea   . Smoldering multiple myeloma (Thomas [name])     Past Surgical History:  Procedure Laterality Date  . ADRENALECTOMY  02/12/11   Left, for Pheochromocytoma  . CARDIOVERSION N/A 12/26/2013   Procedure: CARDIOVERSION;  Surgeon: Thomas Records, MD;  Location: [facility];  Service: Cardiovascular;  Laterality: N/A;  . CHOLECYSTECTOMY  02/12/11  . GASTRECTOMY  1984   S/P gastrectomy for ulcer  . KNEE SURGERY  1971   S/P Right knee surgery  . TEE WITHOUT CARDIOVERSION N/A 12/26/2013   Procedure: TRANSESOPHAGEAL ECHOCARDIOGRAM (TEE);  Surgeon: Thomas Records, MD;  Location: [facility];  Service: Cardiovascular;  Laterality: N/A;  . TRANSURETHRAL  RESECTION OF PROSTATE  7/12    Current Medications: Outpatient Medications Prior to Visit  Medication Sig Dispense Refill  . amLODipine (NORVASC) 5 MG tablet TAKE 1 TABLET BY MOUTH EVERY DAY 30 tablet 9  . aspirin 81 MG tablet Take 81 mg by mouth daily.    . brimonidine-timolol (COMBIGAN) 0.2-0.5 % ophthalmic solution Place 1 drop into both eyes 2 (two) times daily.     . Cholecalciferol (VITAMIN D3) 1000 UNITS CAPS Take 1 capsule by mouth daily.     . clobetasol cream (TEMOVATE) 7.20 % Apply 1 application topically 2 (two) times daily as needed (ITCHING).    Marland Kitchen levothyroxine (SYNTHROID, LEVOTHROID) 50 MCG tablet Take 1 tablet (50 mcg total) by mouth daily before breakfast. 30 tablet 11  . TRAVATAN Z 0.004 % SOLN ophthalmic solution Place 1 drop into both eyes at bedtime.    Marland Kitchen amiodarone (PACERONE) 200 MG tablet Take 200 mg by mouth daily.     Marland Kitchen amiodarone (PACERONE) 200 MG tablet TAKE 1 TABLET BY MOUTH EVERY DAY 30 tablet 1   No facility-administered medications prior to visit.      Allergies:   Benadryl [diphenhydramine hcl]; Ceftriaxone sodium; Contrast media [iodinated diagnostic agents]; Nsaids; Penicillins; and Levaquin [levofloxacin]   Social History   Social History  . Marital status: Married    Spouse name: N/A  . Number of children: 3  . Years of education: N/A   Occupational  History  .  Retired   Social History Main Topics  . Smoking status: Never Smoker  . Smokeless tobacco: Never Used  . Alcohol use No  . Drug use: No  . Sexual activity: Not Currently   Other Topics Concern  . None   Social History Narrative  . None     Family History:  The patient's family history includes Kidney Stones in his brother; Liver disease in his sister, sister, and sister; Melanoma in his brother; Polymyositis in his mother; Prostate cancer in his brother and father.   ROS:   Please see the history of present illness.    Constipation, difficulty with balance. Difficulty  sleeping. Awakens and unable to fall back to sleep. Occasional dizziness.  All other systems reviewed and are negative.   PHYSICAL EXAM:   VS:  BP (!) 172/74 (BP Location: Left Arm)   Pulse (!) 52   Ht _0  (1.651 m)   Wt 196 lb 3.2 oz (89 kg)   BMI 32.65 kg/m    GEN: Well nourished, well developed, in no acute distress. Obese HEENT: normal . Neck: no JVD, carotid bruits, or masses Cardiac: RRR; no murmurs, rubs, or gallops,no edema  Respiratory:  clear to auscultation bilaterally, normal work of breathing GI: soft, nontender, nondistended, + BS MS: no deformity or atrophy  Skin: warm and dry, no rash Neuro:  Alert and Oriented x 3, Strength and sensation are intact Psych: euthymic mood, full affect  Wt Readings from Last 3 Encounters:  09/19/16 196 lb 3.2 oz (89 kg)  08/15/16 199 lb 9.6 oz (90.5 kg)  03/23/16 196 lb 12.8 oz (89.3 kg)      Studies/Labs Reviewed:   EKG:  EKG  Sinus bradycardia borderline prolonged QT and first-degree AV block at 220 ms.  Recent Labs: 03/23/2016: TSH 2.61 08/08/2016: ALT 29; BUN 19.6; Creatinine 2.0; HGB 15.6; Platelets 199; Potassium 4.6; Sodium 138   Lipid Panel No results found for: CHOL, TRIG, HDL, CHOLHDL, VLDL, LDLCALC, LDLDIRECT  Additional studies/ [name] that were reviewed today include:  Liver panel performed in November was normal when he visited oncology. Creatinine was 2.0. UA and 19.6.    ASSESSMENT:    1. Paroxysmal atrial fibrillation (HCC)   2. Sinus node dysfunction (HCC)   3. Essential hypertension   4. Long term current use of amiodarone   5. Chronic diastolic heart failure ([place])   6. OSA on CPAP   7. Mild aortic stenosis      PLAN:  In order of problems listed above:  1. Controlled on amiodarone. Decrease to 100 mg per day because of relative bradycardia. 2. Sinus node dysfunction. Amiodarone was being decreased to 100 mg daily. 3. Poor control and with chronic kidney impairment, will add moderate dose  loop diuretic to help with better systolic blood pressure control. With bradycardia with unable to use beta blocker therapy. With kidney impairment ACE inhibitor and ARB therapy would be difficult to start. 4. No evidence of amiodarone toxicity. Recent liver panel is unremarkable. We'll plan to perform a be met and TSH in 7-10 days after starting furosemide. 5. No evidence of volume overload. Diuretic is being started. 6. Needs follow-up appointment with his sleep physician, Dr. Ellouise Newer. 7. Not addressed.     Medication Adjustments/Labs and Tests Ordered: Current medicines are reviewed at length with the patient today.  Concerns regarding medicines are outlined above.  Medication changes, Labs and Tests ordered today are listed in the Patient Instructions  below. Patient Instructions  Medication Instructions:  1) Decrease Amiodarone to 162m once daily 2) START Furosemide 4104monce daily  Labwork: Your physician recommends that you return for lab work in: 7-10 days (BMET, TSH)   Testing/Procedures: None  Follow-Up: Your physician recommends that you schedule a follow-up appointment next available with Dr. KeClaiborne Billingsor sleep follow up.    Your physician wants you to follow-up in: 6 months with Dr. SmTamala JulianYou will receive a reminder letter in the mail two months in advance. If you don't receive a letter, please call our office to schedule the follow-up appointment.    Any Other Special Instructions Will Be Listed Below (If Applicable).  Your physician recommends that you monitor your blood pressure.  Your goal is to be 140/90 or less.  Please contact our office if you see that your blood pressure is consistently running higher than this.    If you need a refill on your cardiac medications before your next appointment, please call your pharmacy.      Signed, HeSinclair GroomsMD  09/19/2016 10:54 AM    [facility] [address]   [id number]: [phone]: [phone]

## 2016-09-29 ENCOUNTER — Other Ambulatory Visit: Payer: Medicare Other | Admitting: *Deleted

## 2016-09-29 DIAGNOSIS — I480 Paroxysmal atrial fibrillation: Secondary | ICD-10-CM

## 2016-09-29 DIAGNOSIS — Z79899 Other long term (current) drug therapy: Secondary | ICD-10-CM

## 2016-09-29 DIAGNOSIS — I10 Essential (primary) hypertension: Secondary | ICD-10-CM

## 2016-09-29 LAB — BASIC METABOLIC PANEL
BUN: 26 mg/dL — ABNORMAL HIGH (ref 7–25)
CO2: 30 mmol/L (ref 20–31)
Calcium: 9.2 mg/dL (ref 8.6–10.3)
Chloride: 103 mmol/L (ref 98–110)
Creat: 2 mg/dL — ABNORMAL HIGH (ref 0.70–1.18)
Glucose, Bld: 106 mg/dL — ABNORMAL HIGH (ref 65–99)
Potassium: 3.8 mmol/L (ref 3.5–5.3)
Sodium: 139 mmol/L (ref 135–146)

## 2016-09-30 LAB — TSH: TSH: 3.01 mIU/L (ref 0.40–4.50)

## 2016-10-24 ENCOUNTER — Other Ambulatory Visit: Payer: Self-pay | Admitting: Interventional Cardiology

## 2016-10-25 ENCOUNTER — Telehealth: Payer: Self-pay | Admitting: Interventional Cardiology

## 2016-10-25 NOTE — Telephone Encounter (Signed)
Medication Detail    Disp Refills Start End   amiodarone (PACERONE) 200 MG tablet 45 tablet 3 09/19/2016    Sig - Route: Take 0.5 tablets (100 mg total) by mouth daily. - Oral   E-Prescribing Status: Receipt confirmed by pharmacy (09/19/2016 10:45 AM EST)   Pharmacy   [id number] - [place], [place]

## 2016-10-25 NOTE — Telephone Encounter (Signed)
Pt states pharmacy called and left him a message stating doctor wanted him to stop taking the medication that starts with AM.  Pt calling to see which one, amiodarone or amlodipine.  Advised pt that I do not see anywhere in his chart where we had told pt to stop either medication.  Reminded him that we decreased Amio at last appt. Advised pt that he needed to continue both medications.  Pt will call pharmacy and inquire what they meant by their message and will call us if anything is needed from our office.  Pt appreciative for assistance.

## 2016-10-25 NOTE — Telephone Encounter (Signed)
New message  Pt c/o medication issue:  1. Name of Medication: Amiodarone...Marland Kitchen. amlodipine  2. How are you currently taking this medication (dosage and times per day)? 200mg ..... 5mg   3. Are you having a reaction (difficulty breathing--STAT)? no  4. What is your medication issue? Pt states he was to stop taking one of the medications, but does not know which med to stop. Please call back to discuss

## 2016-11-23 ENCOUNTER — Encounter: Payer: Self-pay | Admitting: Cardiovascular Disease

## 2016-11-23 ENCOUNTER — Ambulatory Visit (INDEPENDENT_AMBULATORY_CARE_PROVIDER_SITE_OTHER): Payer: Medicare Other | Admitting: Cardiovascular Disease

## 2016-11-23 VITALS — BP 140/66 | HR 59 | Ht 65.0 in | Wt 196.0 lb

## 2016-11-23 DIAGNOSIS — I350 Nonrheumatic aortic (valve) stenosis: Secondary | ICD-10-CM | POA: Diagnosis not present

## 2016-11-23 DIAGNOSIS — I495 Sick sinus syndrome: Secondary | ICD-10-CM | POA: Diagnosis not present

## 2016-11-23 DIAGNOSIS — Z79899 Other long term (current) drug therapy: Secondary | ICD-10-CM

## 2016-11-23 DIAGNOSIS — I480 Paroxysmal atrial fibrillation: Secondary | ICD-10-CM

## 2016-11-23 DIAGNOSIS — G4733 Obstructive sleep apnea (adult) (pediatric): Secondary | ICD-10-CM | POA: Diagnosis not present

## 2016-11-23 NOTE — Progress Notes (Signed)
Patient ID: Thomas [name], male   DOB: 1935-01-18, 82 y.o.   MRN: [id number]     HPI: [name], is a 82 y.o. male who is followed by Dr. Tamala Julian for cardiology care.  I saw him  in September 2015 for sleep clinic evaluation following initiation of CPAP therapy.  He presents for follow-up evaluation  Mr Asch has a history of paroxysmal atrial fibrillation and  has been maintaining sinus rhythm with sinus bradycardia.  He has a history of hypertension, hyperlipidemia, documented bilateral renal cysts, and mild aortic valve stenosis, and multiple myeloma.  Due to concerns for obstructive sleep apnea, particularly with his atrial fibrillation, history he was referred by Dr. Tamala Julian for a diagnostic polysomnogram in 2015.   His diagnostic polysomnogram was done at the [facility] on 02/05/2014 revealed severe obstructive sleep apnea with an AHI of 45.7 per hour.  He had oxygen desaturation to 86% and there was evidence for moderate snoring.  There were frequent periodic limb movements with an index of 36 per hour with 20.3 per hour to arousal.  He subsequently was referred for a CPAP titration in a CPAP pressure of 9 cm was recommended.  Of note, there was significant improvement in his periodic leg movements with CPAP therapy.  A download from 05/24/2014 to 06/22/2014 showed excellent compliance with 100% days of usage.  He had 97% days with use greater than 4 hours and was meeting Medicare compliance standards.  He has an AirSense 10 AutoSet unit and is at a set pressure of 9 cm.  His AHI is now excellent at 1.2.  There is no leak.  He has a Special educational needs teacher, fullface mask (medium).  Over the past 3 years, he admits to 100% compliance with CPAP therapy.  A recent download was obtained from 10/23/2016 through 11/21/2016, which confirmed 100% compliance.  He continues to be at a 9 cm set pressure.  AHI is excellent at 1.0.  He uses a fullface mask.  There is no significant leak.   He continues to feel markedly improved with reference to sleep.  Now, only getting up 1 time to go to the bathroom in comparison to at least 3 times previously.  Sleep is restorative.  He has more energy.  He denies any hypersomnolence.  He denies significant daytime sleepiness.  His Epworth Sleepiness Scale score is improved and is now at 6 compared to previously being elevated  Epworth Sleepiness Scale: Situation   Chance of Dozing/Sleeping (0 = never , 1 = slight chance , 2 = moderate chance , 3 = high chance )   sitting and reading 2   watching TV 1   sitting inactive in a public place 0   being a passenger in a motor vehicle for an hour or more 0   lying down in the afternoon 3   sitting and talking to someone 0   sitting quietly after lunch (no alcohol) 0   while stopped for a few minutes in traffic as the driver 0   Total Score  6     Past Medical History:  Diagnosis Date  . BPH (benign prostatic hypertrophy)   . Chronic kidney disease    Hx of bilateral renal cysts.  . Glaucoma   . Heart murmur   . History of atrial fibrillation 03/09/09  . Hx of epiglottitis    2011  . Hypercholesterolemia   . Hypertension   . Mild aortic stenosis   .  Prostate cancer ([place]) 7/12   T1C Ad Ca  s/p TURP, observation only  . Sleep apnea   . Smoldering multiple myeloma ([place])     Past Surgical History:  Procedure Laterality Date  . ADRENALECTOMY  02/12/11   Left, for Pheochromocytoma  . CARDIOVERSION N/A 12/26/2013   Procedure: CARDIOVERSION;  Surgeon: [name], MD;  Location: [facility];  Service: Cardiovascular;  Laterality: N/A;  . CHOLECYSTECTOMY  02/12/11  . GASTRECTOMY  1984   S/P gastrectomy for ulcer  . KNEE SURGERY  1971   S/P Right knee surgery  . TEE WITHOUT CARDIOVERSION N/A 12/26/2013   Procedure: TRANSESOPHAGEAL ECHOCARDIOGRAM (TEE);  Surgeon: [name], MD;  Location: [facility];  Service: Cardiovascular;  Laterality: N/A;  . TRANSURETHRAL RESECTION OF PROSTATE   7/12    Allergies  Allergen Reactions  . Benadryl [Diphenhydramine Hcl] Other (See Comments)    Bad dreams -  Hallucinations.  . Ceftriaxone Sodium Hives  . Contrast Media [Iodinated Diagnostic Agents] Other (See Comments)    Poor kidney function  . Nsaids Other (See Comments)    REACTION: BLEEDING ULCER  . Penicillins Hives  . Levaquin [Levofloxacin]     Current Outpatient Prescriptions  Medication Sig Dispense Refill  . amiodarone (PACERONE) 200 MG tablet Take 0.5 tablets (100 mg total) by mouth daily. 45 tablet 3  . amLODipine (NORVASC) 5 MG tablet TAKE 1 TABLET BY MOUTH EVERY DAY 30 tablet 9  . aspirin 81 MG tablet Take 81 mg by mouth daily.    . brimonidine-timolol (COMBIGAN) 0.2-0.5 % ophthalmic solution Place 1 drop into both eyes 2 (two) times daily.     . Cholecalciferol (VITAMIN D3) 1000 UNITS CAPS Take 1 capsule by mouth daily.     . clobetasol cream (TEMOVATE) 4.65 % Apply 1 application topically 2 (two) times daily as needed (ITCHING).    . furosemide (LASIX) 40 MG tablet Take 1 tablet (40 mg total) by mouth daily. 90 tablet 3  . levothyroxine (SYNTHROID, LEVOTHROID) 50 MCG tablet Take 1 tablet (50 mcg total) by mouth daily before breakfast. 30 tablet 11  . TRAVATAN Z 0.004 % SOLN ophthalmic solution Place 1 drop into both eyes at bedtime.     No current facility-administered medications for this visit.     Social History   Social History  . Marital status: Married    Spouse name: N/A  . Number of children: 3  . Years of education: N/A   Occupational History  .  Retired   Social History Main Topics  . Smoking status: Never Smoker  . Smokeless tobacco: Never Used  . Alcohol use No  . Drug use: No  . Sexual activity: Not Currently   Other Topics Concern  . Not on file   Social History Narrative  . No narrative on file     ROS General: Negative; No fevers, chills, or night sweats HEENT: Negative; No changes in vision or hearing, sinus congestion,  difficulty swallowing Pulmonary: Negative; No cough, wheezing, shortness of breath, hemoptysis Cardiovascular: Positive for PAF No chest pain, presyncope, syncope, palpatations GI: Negative; No nausea, vomiting, diarrhea, or abdominal pain GU: Negative; No dysuria, hematuria, or difficulty voiding Musculoskeletal: Negative; no myalgias, joint pain, or weakness Hematologic: Positive for multiple myeloma; no easy bruising, bleeding Endocrine: Negative; no heat/cold intolerance Neuro: Negative; no changes in balance, headaches Skin: Negative; No rashes or skin lesions Psychiatric: Negative; No behavioral problems, depression Sleep: Positive for OSA on CPAP; No daytime sleepiness, hypersomnolence,  bruxism, restless legs, hypnogognic hallucinations, no cataplexy   Physical Exam BP 140/66 (BP Location: Right Arm, Patient Position: Sitting, Cuff Size: Normal)   Pulse (!) 59   Ht 5' 5"  (1.651 m)   Wt 196 lb (88.9 kg)   BMI 32.62 kg/m    Repeat blood pressure by me 130/80  Wt Readings from Last 3 Encounters:  11/23/16 196 lb (88.9 kg)  09/19/16 196 lb 3.2 oz (89 kg)  08/15/16 199 lb 9.6 oz (90.5 kg)    General: Alert, oriented, no distress.  Skin: normal turgor, no rashes HEENT: Normocephalic, atraumatic. Pupils round and reactive; sclera anicteric; extraocular muscles intact; Fundi without hemorrhages or exudates. Nose without nasal septal hypertrophy Mouth/Parynx benign; Mallinpatti scale 3 Neck: No JVD, no carotid briuts Lungs: clear to ausculatation and percussion; no wheezing or rales  Chest wall: No tenderness to palpation Heart: RRR, s1 s2 normal  Abdomen: soft, nontender; no hepatosplenomehaly, BS+; abdominal aorta nontender and not dilated by palpation. Back: No CVA tenderness Pulses 2+ Extremities: no clubbinbg cyanosis or edema, Homan's sign negative  Neurologic: grossly nonfocal; cranial nerves intact. Psychological: Normal affect and mood.  ECG (independently read by  me): Sinus bradycardia at 59 bpm with first-degree AV block (PR interval 21 6/22).  QTc interval 451 ms.  LABS:  BMP Latest Ref Rng & Units 09/29/2016 08/08/2016 02/15/2016  Glucose 65 - 99 mg/dL 106(H) 89 100  BUN 7 - 25 mg/dL 26(H) 19.6 18.7  Creatinine 0.70 - 1.18 mg/dL 2.00(H) 2.0(H) 2.1(H)  Sodium 135 - 146 mmol/L 139 138 139  Potassium 3.5 - 5.3 mmol/L 3.8 4.6 4.1  Chloride 98 - 110 mmol/L 103 - -  CO2 20 - 31 mmol/L 30 26 25   Calcium 8.6 - 10.3 mg/dL 9.2 9.7 9.8   Hepatic Function Latest Ref Rng & Units 08/08/2016 03/23/2016 02/15/2016  Total Protein 6.4 - 8.3 g/dL 8.0 7.6 8.1  Albumin 3.5 - 5.0 g/dL 3.7 4.1 4.0  AST 5 - 34 U/L 26 28 24   ALT 0 - 55 U/L 29 26 27   Alk Phosphatase 40 - 150 U/L 93 71 78  Total Bilirubin 0.20 - 1.20 mg/dL 0.52 0.8 1.08  Bilirubin, Direct <=0.2 mg/dL - 0.1 -   CBC Latest Ref Rng & Units 08/08/2016 02/15/2016 07/29/2015  WBC 4.0 - 10.3 10e3/uL 6.8 6.6 6.7  Hemoglobin 13.0 - 17.1 g/dL 15.6 16.4 15.7  Hematocrit 38.4 - 49.9 % 46.5 49.7 46.3  Platelets 140 - 400 10e3/uL 199 207 195   Lab Results  Component Value Date   TSH 3.01 09/29/2016   Lab Results  Component Value Date   HGBA1C 5.4 12/25/2013   Lipid Panel  No results found for: CHOL, TRIG, HDL, CHOLHDL, VLDL, LDLCALC, LDLDIRECT   RADIOLOGY: No results found.  IMPRESSION:  1. OSA (obstructive sleep apnea)   2. Paroxysmal atrial fibrillation (HCC)   3. Sinus node dysfunction (HCC)   4. Mild aortic stenosis   5. On amiodarone therapy      ASSESSMENT AND PLAN: Mr. Meliton Rattan Is a 82 year old male who has a history of paroxysmal atrial fibrillation, previous documentation of chronic diastolic heart failure, hypertension, chronic kidney disease, and mild aortic stenosis.  He has been documented to have severe obstructive sleep apnea with an AHI of 45.7 and has been on CPAP therapy since 03/26/2014.   He had significant periodic leg movements on the baseline study, but these significantly  improved with CPAP initiation.  He does have issues with his left  knee and often moves his legs for comfort but denies any painful restless legs.  I reviewed his most recent download, which confirms 100% compliance and excellent benefit with CPAP therapy with an AHI of 1.0.  He continues to use a full face mask without leak.  [facility] is his DME company.  His ECG today confirms maintenance of sinus rhythm with sinus bradycardia.  He does have first-degree AV block.  His heart rate is 59 bpm.  PR interval 216 and QTc interval 451 ms.  His blood pressure remains stable.  He will continue current therapy.  Per Medicare requirements I will see him in one year for follow-up sleep evaluation.   Troy Sine, MD, [facility]  11/23/2016 6:18 PM

## 2016-11-23 NOTE — Patient Instructions (Signed)
Your physician wants you to follow-up in: 1 year or sooner with Dr Claiborne Billings if needed for sleep You will receive a reminder letter in the mail two months in advance. If you don't receive a letter, please call our office to schedule the follow-up appointment.

## 2017-01-31 ENCOUNTER — Other Ambulatory Visit: Payer: Self-pay

## 2017-01-31 MED ORDER — LEVOTHYROXINE SODIUM 50 MCG PO TABS: 50.0000 ug | ORAL_TABLET | Freq: Every day | ORAL | 0 refills | Status: DC

## 2017-01-31 NOTE — Telephone Encounter (Signed)
That's fine

## 2017-02-13 ENCOUNTER — Other Ambulatory Visit (HOSPITAL_BASED_OUTPATIENT_CLINIC_OR_DEPARTMENT_OTHER): Payer: Medicare Other

## 2017-02-13 DIAGNOSIS — C9000 Multiple myeloma not having achieved remission: Secondary | ICD-10-CM

## 2017-02-13 LAB — COMPREHENSIVE METABOLIC PANEL
ALT: 19 U/L (ref 0–55)
AST: 20 U/L (ref 5–34)
Albumin: 4.1 g/dL (ref 3.5–5.0)
Alkaline Phosphatase: 83 U/L (ref 40–150)
Anion Gap: 5 mEq/L (ref 3–11)
BUN: 27.7 mg/dL — ABNORMAL HIGH (ref 7.0–26.0)
CO2: 25 mEq/L (ref 22–29)
Calcium: 9.9 mg/dL (ref 8.4–10.4)
Chloride: 110 mEq/L — ABNORMAL HIGH (ref 98–109)
Creatinine: 2.2 mg/dL — ABNORMAL HIGH (ref 0.7–1.3)
EGFR: 28 mL/min/{1.73_m2} — ABNORMAL LOW (ref >90)
Glucose: 91 mg/dl (ref 70–140)
Potassium: 4.5 mEq/L (ref 3.5–5.1)
Sodium: 140 mEq/L (ref 136–145)
Total Bilirubin: 0.67 mg/dL (ref 0.20–1.20)
Total Protein: 8.1 g/dL (ref 6.4–8.3)

## 2017-02-13 LAB — CBC WITH DIFFERENTIAL/PLATELET
BASO%: 0 % (ref 0.0–2.0)
Basophils Absolute: 0 10*3/uL (ref 0.0–0.1)
EOS%: 1.9 % (ref 0.0–7.0)
Eosinophils Absolute: 0.1 10*3/uL (ref 0.0–0.5)
HCT: 42.5 % (ref 38.4–49.9)
HGB: 14.7 g/dL (ref 13.0–17.1)
LYMPH%: 36.5 % (ref 14.0–49.0)
MCH: 33 pg (ref 27.2–33.4)
MCHC: 34.6 g/dL (ref 32.0–36.0)
MCV: 95.3 fL (ref 79.3–98.0)
MONO#: 0.5 10*3/uL (ref 0.1–0.9)
MONO%: 7.9 % (ref 0.0–14.0)
NEUT#: 3.7 10*3/uL (ref 1.5–6.5)
NEUT%: 53.7 % (ref 39.0–75.0)
Platelets: 185 10*3/uL (ref 140–400)
RBC: 4.46 10*6/uL (ref 4.20–5.82)
RDW: 14.2 % (ref 11.0–14.6)
WBC: 6.8 10*3/uL (ref 4.0–10.3)
lymph#: 2.5 10*3/uL (ref 0.9–3.3)

## 2017-02-13 LAB — LACTATE DEHYDROGENASE: LDH: 191 U/L (ref 125–245)

## 2017-02-14 LAB — KAPPA/LAMBDA LIGHT CHAINS
Ig Kappa Free Light Chain: 769 mg/L — ABNORMAL HIGH (ref 3.3–19.4)
Ig Lambda Free Light Chain: 12.1 mg/L (ref 5.7–26.3)
Kappa/Lambda FluidC Ratio: 63.55 — ABNORMAL HIGH (ref 0.26–1.65)

## 2017-02-14 LAB — IGG, IGA, IGM
IgA, Qn, Serum: 50 mg/dL — ABNORMAL LOW (ref 61–437)
IgG, Qn, Serum: 2050 mg/dL — ABNORMAL HIGH (ref 700–1600)
IgM, Qn, Serum: 18 mg/dL (ref 15–143)

## 2017-02-14 LAB — BETA 2 MICROGLOBULIN, SERUM: Beta-2: 3.3 mg/L — ABNORMAL HIGH (ref 0.6–2.4)

## 2017-02-20 ENCOUNTER — Telehealth: Payer: Self-pay | Admitting: Internal Medicine

## 2017-02-20 ENCOUNTER — Encounter: Payer: Self-pay | Admitting: Internal Medicine

## 2017-02-20 ENCOUNTER — Ambulatory Visit (HOSPITAL_BASED_OUTPATIENT_CLINIC_OR_DEPARTMENT_OTHER): Payer: Medicare Other | Admitting: Internal Medicine

## 2017-02-20 VITALS — BP 148/69 | HR 57 | Temp 97.8°F | Resp 18 | Ht 65.0 in | Wt 194.6 lb

## 2017-02-20 DIAGNOSIS — N289 Disorder of kidney and ureter, unspecified: Secondary | ICD-10-CM

## 2017-02-20 DIAGNOSIS — C9000 Multiple myeloma not having achieved remission: Secondary | ICD-10-CM | POA: Diagnosis not present

## 2017-02-20 NOTE — Telephone Encounter (Signed)
Gave  Patient AVS and calender per 5/21 los. Lab and f/u in 6 months.

## 2017-02-20 NOTE — Progress Notes (Signed)
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 2) history of pheochromocytoma LEFT adrenal gland status post resection on 03/16/2009  PRIOR THERAPY: None  CURRENT THERAPY: Observation  INTERVAL HISTORY: Thomas [name] 82 y.o. male returns to the clinic for follow-up visit accompanied by his wife. The patient is feeling fine today was no specific complaints except for arthralgia of his knees. He denied having any weight loss or night sweats. He has no nausea, vomiting, diarrhea or constipation. He denied having any chest pain, shortness of breath, cough or hemoptysis. The patient had repeat myeloma panel performed recently and he is here for evaluation and discussion of his lab results.  MEDICAL HISTORY: Past Medical History:  Diagnosis Date  . BPH (benign prostatic hypertrophy)   . Chronic kidney disease    Hx of bilateral renal cysts.  . Glaucoma   . Heart murmur   . History of atrial fibrillation 03/09/09  . Hx of epiglottitis    2011  . Hypercholesterolemia   . Hypertension   . Mild aortic stenosis   . Prostate cancer ([place]) 7/12   T1C Ad Ca  s/p TURP, observation only  . Sleep apnea   . Smoldering multiple myeloma (HCC)     ALLERGIES:  is allergic to benadryl [diphenhydramine hcl]; ceftriaxone sodium; contrast media [iodinated diagnostic agents]; nsaids; penicillins; and levaquin [levofloxacin].  MEDICATIONS:  Current Outpatient Prescriptions  Medication Sig Dispense Refill  . amiodarone (PACERONE) 200 MG tablet Take 0.5 tablets (100 mg total) by mouth daily. 45 tablet 3  . amLODipine (NORVASC) 5 MG tablet TAKE 1 TABLET BY MOUTH EVERY DAY 30 tablet 9  . aspirin 81 MG tablet Take 81 mg by mouth daily.    .  brimonidine-timolol (COMBIGAN) 0.2-0.5 % ophthalmic solution Place 1 drop into both eyes 2 (two) times daily.     . Cholecalciferol (VITAMIN D3) 1000 UNITS CAPS Take 1 capsule by mouth daily.     . clobetasol cream (TEMOVATE) 2.72 % Apply 1 application topically 2 (two) times daily as needed (ITCHING).    Marland Kitchen levothyroxine (SYNTHROID, LEVOTHROID) 50 MCG tablet Take 1 tablet (50 mcg total) by mouth daily before breakfast. 90 tablet 0  . TRAVATAN Z 0.004 % SOLN ophthalmic solution Place 1 drop into both eyes at bedtime.    . furosemide (LASIX) 40 MG tablet Take 1 tablet (40 mg total) by mouth daily. 90 tablet 3   No current facility-administered medications for this visit.     SURGICAL HISTORY:  Past Surgical History:  Procedure Laterality Date  . ADRENALECTOMY  02/12/11   Left, for Pheochromocytoma  . CARDIOVERSION N/A 12/26/2013   Procedure: CARDIOVERSION;  Surgeon: [name], MD;  Location: [facility];  Service: Cardiovascular;  Laterality: N/A;  . CHOLECYSTECTOMY  02/12/11  . GASTRECTOMY  1984   S/P gastrectomy for ulcer  . KNEE SURGERY  1971   S/P Right knee surgery  . TEE WITHOUT CARDIOVERSION N/A 12/26/2013   Procedure: TRANSESOPHAGEAL ECHOCARDIOGRAM (TEE);  Surgeon: [name], MD;  Location: [facility];  Service: Cardiovascular;  Laterality: N/A;  . TRANSURETHRAL RESECTION OF PROSTATE  7/12    REVIEW OF SYSTEMS:  A comprehensive review of systems was negative except for: Musculoskeletal: positive for arthralgias  PHYSICAL EXAMINATION: General appearance: alert, cooperative and no distress Head: Normocephalic, without obvious abnormality, atraumatic Neck: no adenopathy, no JVD, supple, symmetrical, trachea midline and thyroid not enlarged, symmetric, no tenderness/mass/nodules Lymph nodes: Cervical, supraclavicular, and axillary nodes normal. Resp: clear to auscultation bilaterally Back: symmetric, no curvature. ROM normal. No CVA tenderness. Cardio: regular rate and  rhythm, S1, S2 normal, no murmur, click, rub or gallop GI: soft, non-tender; bowel sounds normal; no masses,  no organomegaly Extremities: extremities normal, atraumatic, no cyanosis or edema  ECOG PERFORMANCE STATUS: 1 - Symptomatic but completely ambulatory  Blood pressure (!) 148/69, pulse (!) 57, temperature 97.8 F (36.6 C), temperature source Oral, resp. rate 18, height _0  (1.651 m), weight 194 lb 9.6 oz (88.3 kg), SpO2 98 %.  LABORATORY DATA: Lab Results  Component Value Date   WBC 6.8 02/13/2017   HGB 14.7 02/13/2017   HCT 42.5 02/13/2017   MCV 95.3 02/13/2017   PLT 185 02/13/2017      Chemistry      Component Value Date/Time   NA 140 02/13/2017 0916   K 4.5 02/13/2017 0916   CL 103 09/29/2016 1003   CL 109 (H) 03/21/2013 0958   CO2 25 02/13/2017 0916   BUN 27.7 (H) 02/13/2017 0916   CREATININE 2.2 (H) 02/13/2017 0916      Component Value Date/Time   CALCIUM 9.9 02/13/2017 0916   ALKPHOS 83 02/13/2017 0916   AST 20 02/13/2017 0916   ALT 19 02/13/2017 0916   BILITOT 0.67 02/13/2017 0916       RADIOGRAPHIC STUDIES: No results found.  ASSESSMENT AND PLAN:  This is a very pleasant 82 years old white male with IgG smoldering [place] diagnosed in April 2011. The patient has been on observation for the last few years and he is feeling fine with no specific complaints. He has no end organ damage except for the chronic renal insufficiency which could be secondary to other comorbidities including hypertension. His myeloma panel showed no clear evidence for progression. I discussed the lab result with the patient and his wife. I recommended for him to continue on observation with repeat myeloma panel in 6 months. He was advised to call immediately if he has any concerning symptoms in the interval. The patient voices understanding of current disease status and treatment options and is in agreement with the current care plan. All questions were answered. The patient knows  to call the clinic with any problems, questions or concerns. We can certainly see the patient much sooner if necessary. I spent 10 minutes counseling the patient face to face. The total time spent in the appointment was 15 minutes.  Disclaimer: This note was dictated with voice recognition software. Similar sounding words can inadvertently be transcribed and may not be corrected upon review.

## 2017-03-27 NOTE — Progress Notes (Signed)
Cardiology Office Note    Date:  03/29/2017   ID:  Taylor, Levick [birth year], MRN [id number]  PCP:  [name], MD  Cardiologist: [name], MD   Chief Complaint  Patient presents with  . Atrial Fibrillation  . Congestive Heart Failure  . Hypertension    History of Present Illness:  Thomas [name] is a 82 y.o. male paroxysmal atrial fibrillation, chronic diastolic heart failure, hypertension, chronic kidney disease, unable to use anticoagulation therapy due to recurrent GU bleeding, and mild aortic stenosis.  Doing well. Denies chest discomfort, dyspnea, ankle edema, orthopnea, and syncope. No profound weakness or fatigue as he had with atrial fibrillation. He stopped taking furosemide because he is having difficulty urinating and the furosemide aggravated the problem. He has been off of it now for about 4 weeks. No dyspnea or other complaints.   Past Medical History:  Diagnosis Date  . BPH (benign prostatic hypertrophy)   . Chronic kidney disease    Hx of bilateral renal cysts.  . Glaucoma   . Heart murmur   . History of atrial fibrillation 03/09/09  . Hx of epiglottitis    2011  . Hypercholesterolemia   . Hypertension   . Mild aortic stenosis   . Prostate cancer ([facility]) 7/12   T1C Ad Ca  s/p TURP, observation only  . Sleep apnea   . Smoldering multiple myeloma ([facility])     Past Surgical History:  Procedure Laterality Date  . ADRENALECTOMY  02/12/11   Left, for Pheochromocytoma  . CARDIOVERSION N/A 12/26/2013   Procedure: CARDIOVERSION;  Surgeon: [name], MD;  Location: [facility];  Service: Cardiovascular;  Laterality: N/A;  . CHOLECYSTECTOMY  02/12/11  . GASTRECTOMY  1984   S/P gastrectomy for ulcer  . KNEE SURGERY  1971   S/P Right knee surgery  . TEE WITHOUT CARDIOVERSION N/A 12/26/2013   Procedure: TRANSESOPHAGEAL ECHOCARDIOGRAM (TEE);  Surgeon: [name], MD;  Location: [facility];  Service: Cardiovascular;  Laterality: N/A;  .  TRANSURETHRAL RESECTION OF PROSTATE  7/12    Current Medications: Outpatient Medications Prior to Visit  Medication Sig Dispense Refill  . amiodarone (PACERONE) 200 MG tablet Take 0.5 tablets (100 mg total) by mouth daily. 45 tablet 3  . amLODipine (NORVASC) 5 MG tablet TAKE 1 TABLET BY MOUTH EVERY DAY 30 tablet 9  . aspirin 81 MG tablet Take 81 mg by mouth daily.    . brimonidine-timolol (COMBIGAN) 0.2-0.5 % ophthalmic solution Place 1 drop into both eyes 2 (two) times daily.     . Cholecalciferol (VITAMIN D3) 1000 UNITS CAPS Take 1 capsule by mouth daily.     . clobetasol cream (TEMOVATE) 8.41 % Apply 1 application topically 2 (two) times daily as needed (ITCHING).    Marland Kitchen levothyroxine (SYNTHROID, LEVOTHROID) 50 MCG tablet Take 1 tablet (50 mcg total) by mouth daily before breakfast. 90 tablet 0  . TRAVATAN Z 0.004 % SOLN ophthalmic solution Place 1 drop into both eyes at bedtime.    . furosemide (LASIX) 40 MG tablet Take 1 tablet (40 mg total) by mouth daily. 90 tablet 3   No facility-administered medications prior to visit.      Allergies:   Benadryl [diphenhydramine hcl]; Ceftriaxone sodium; Contrast media [iodinated diagnostic agents]; Nsaids; Penicillins; and Levaquin [levofloxacin]   Social History   Social History  . Marital status: Married    Spouse name: N/A  . Number of children: 3  . Years of education:  N/A   Occupational History  .  Retired   Social History Main Topics  . Smoking status: Never Smoker  . Smokeless tobacco: Never Used  . Alcohol use No  . Drug use: No  . Sexual activity: Not Currently   Other Topics Concern  . None   Social History Narrative  . None     Family History:  The patient's family history includes Kidney Stones in his brother; Liver disease in his sister, sister, and sister; Melanoma in his brother; Polymyositis in his mother; Prostate cancer in his brother and father.   ROS:   Please see the history of present illness.      Significant arthritis in both knees and his right ankle. Contemplating surgery. Difficulty urinating as mentioned. Being observed by his primary physician.  All other systems reviewed and are negative.   PHYSICAL EXAM:   VS:  BP (!) 168/66 (BP Location: Right Arm)   Pulse (!) 54   Ht _0  (1.676 m)   Wt 196 lb 6.4 oz (89.1 kg)   BMI 31.70 kg/m    GEN: Well nourished, well developed, in no acute distress . Obese. HEENT: normal  Neck: no JVD, carotid bruits, or masses Cardiac: RRR,  rubs, or gallops,no edema. There is a 2/6 right upper sternal border diamond-shaped systolic murmur. No diastolic murmurs heard.  Respiratory:  clear to auscultation bilaterally, normal work of breathing GI: soft, nontender, nondistended, + BS MS: no deformity or atrophy  Skin: warm and dry, no rash Neuro:  Alert and Oriented x 3, Strength and sensation are intact Psych: euthymic mood, full affect  Wt Readings from Last 3 Encounters:  03/29/17 196 lb 6.4 oz (89.1 kg)  02/20/17 194 lb 9.6 oz (88.3 kg)  11/23/16 196 lb (88.9 kg)      Studies/Labs Reviewed:   EKG:  EKG  Not performed. ECG performed 11/2016 when he saw Dr. Claiborne Billings for sleep disorder reveals sinus bradycardia with first-degree AV block.  Recent Labs: 09/29/2016: TSH 3.01 02/13/2017: ALT 19; BUN 27.7; Creatinine 2.2; HGB 14.7; Platelets 185; Potassium 4.5; Sodium 140   Lipid Panel No results found for: CHOL, TRIG, HDL, CHOLHDL, VLDL, LDLCALC, LDLDIRECT  Additional studies/ records that were reviewed today include:  No new imaging or functional data    ASSESSMENT:    1. Paroxysmal atrial fibrillation (HCC)   2. Sinus node dysfunction (HCC)   3. Mild aortic stenosis   4. Essential hypertension   5. Chronic diastolic heart failure ([facility])   6. OSA on CPAP   7. On amiodarone therapy   8. Hypercholesterolemia      PLAN:  In order of problems listed above:  1. Currently in sinus rhythm based on exam. On amiodarone for  suppression of recurrent A. fib. TSH and hepatic panel will be obtained today and in 6 months on return. No excessive bradycardia. 2. Sinus rhythm/sinus bradycardia without excessive bradycardia. 3. Systolic murmur compatible with aortic stenosis. No recent evaluation. Planning to have knee surgery at some point this shear. We will need an echocardiogram prior to clearing for surgery on his knees. 4. Elevated systolic blood pressure. He will monitor at home. If consistently above 140 mmHg, we will need to reinstitute diuretic therapy. 5. No evidence of volume overload. He is now off diuretic therapy for 4 weeks. 6. Continuous positive airway pressure. 7. No evidence of toxicity. TSH and hepatic panel today.  Clinical follow-up in 6 months. We need to consider performing an echocardiogram.  Medication Adjustments/Labs and Tests Ordered: Current medicines are reviewed at length with the patient today.  Concerns regarding medicines are outlined above.  Medication changes, Labs and Tests ordered today are listed in the Patient Instructions below. There are no Patient Instructions on file for this visit.   Signed, [name], MD  03/29/2017 11:17 AM    [facility] [address], [place], [place]  [address] Phone: [phone]; Fax: [phone]

## 2017-03-29 ENCOUNTER — Ambulatory Visit (INDEPENDENT_AMBULATORY_CARE_PROVIDER_SITE_OTHER): Payer: Medicare Other | Admitting: Interventional Cardiology

## 2017-03-29 ENCOUNTER — Encounter: Payer: Self-pay | Admitting: Interventional Cardiology

## 2017-03-29 VITALS — BP 168/66 | HR 54 | Ht 66.0 in | Wt 196.4 lb

## 2017-03-29 DIAGNOSIS — I350 Nonrheumatic aortic (valve) stenosis: Secondary | ICD-10-CM | POA: Diagnosis not present

## 2017-03-29 DIAGNOSIS — I10 Essential (primary) hypertension: Secondary | ICD-10-CM | POA: Diagnosis not present

## 2017-03-29 DIAGNOSIS — E7800 Pure hypercholesterolemia, unspecified: Secondary | ICD-10-CM | POA: Diagnosis not present

## 2017-03-29 DIAGNOSIS — I480 Paroxysmal atrial fibrillation: Secondary | ICD-10-CM | POA: Diagnosis not present

## 2017-03-29 DIAGNOSIS — I5032 Chronic diastolic (congestive) heart failure: Secondary | ICD-10-CM

## 2017-03-29 DIAGNOSIS — Z79899 Other long term (current) drug therapy: Secondary | ICD-10-CM | POA: Diagnosis not present

## 2017-03-29 DIAGNOSIS — G4733 Obstructive sleep apnea (adult) (pediatric): Secondary | ICD-10-CM

## 2017-03-29 DIAGNOSIS — Z9989 Dependence on other enabling machines and devices: Secondary | ICD-10-CM

## 2017-03-29 DIAGNOSIS — I495 Sick sinus syndrome: Secondary | ICD-10-CM

## 2017-03-29 LAB — TSH: TSH: 4.5 u[IU]/mL (ref 0.450–4.500)

## 2017-03-29 NOTE — Patient Instructions (Signed)
Medication Instructions:  None  Labwork: TSH today  Testing/Procedures: None  Follow-Up: Your physician wants you to follow-up in: 6 months with Dr. Tamala Julian. You will receive a reminder letter in the mail two months in advance. If you don't receive a letter, please call our office to schedule the follow-up appointment.   Any Other Special Instructions Will Be Listed Below (If Applicable).  Monitor your blood pressure 2-3 times a month.  If you notice your top number above 140 consistently, please contact our office and let us know.    If you need a refill on your cardiac medications before your next appointment, please call your pharmacy.

## 2017-04-16 ENCOUNTER — Other Ambulatory Visit: Payer: Self-pay | Admitting: Interventional Cardiology

## 2017-06-22 ENCOUNTER — Telehealth: Payer: Self-pay | Admitting: *Deleted

## 2017-06-22 NOTE — Telephone Encounter (Signed)
CPAP orders signed by Dr. Kelly and faxed to [facility] 

## 2017-06-25 ENCOUNTER — Other Ambulatory Visit: Payer: Self-pay | Admitting: Interventional Cardiology

## 2017-06-27 NOTE — Telephone Encounter (Signed)
Pt's pharmacy is requesting a refill on Levothyroxine 50 mcg. Would you like to refill this medication? Please advise

## 2017-08-16 ENCOUNTER — Other Ambulatory Visit (HOSPITAL_BASED_OUTPATIENT_CLINIC_OR_DEPARTMENT_OTHER): Payer: Medicare Other

## 2017-08-16 DIAGNOSIS — C9000 Multiple myeloma not having achieved remission: Secondary | ICD-10-CM

## 2017-08-16 DIAGNOSIS — N289 Disorder of kidney and ureter, unspecified: Secondary | ICD-10-CM | POA: Diagnosis not present

## 2017-08-16 LAB — CBC WITH DIFFERENTIAL/PLATELET
BASO%: 0.2 % (ref 0.0–2.0)
Basophils Absolute: 0 10*3/uL (ref 0.0–0.1)
EOS%: 2 % (ref 0.0–7.0)
Eosinophils Absolute: 0.1 10*3/uL (ref 0.0–0.5)
HCT: 46.3 % (ref 38.4–49.9)
HGB: 15.6 g/dL (ref 13.0–17.1)
LYMPH%: 31.7 % (ref 14.0–49.0)
MCH: 32.1 pg (ref 27.2–33.4)
MCHC: 33.8 g/dL (ref 32.0–36.0)
MCV: 94.9 fL (ref 79.3–98.0)
MONO#: 0.5 10*3/uL (ref 0.1–0.9)
MONO%: 7.9 % (ref 0.0–14.0)
NEUT#: 4 10*3/uL (ref 1.5–6.5)
NEUT%: 58.2 % (ref 39.0–75.0)
Platelets: 198 10*3/uL (ref 140–400)
RBC: 4.88 10*6/uL (ref 4.20–5.82)
RDW: 14.4 % (ref 11.0–14.6)
WBC: 6.9 10*3/uL (ref 4.0–10.3)
lymph#: 2.2 10*3/uL (ref 0.9–3.3)

## 2017-08-16 LAB — COMPREHENSIVE METABOLIC PANEL
ALT: 16 U/L (ref 0–55)
AST: 22 U/L (ref 5–34)
Albumin: 4.1 g/dL (ref 3.5–5.0)
Alkaline Phosphatase: 86 U/L (ref 40–150)
Anion Gap: 6 mEq/L (ref 3–11)
BUN: 17.3 mg/dL (ref 7.0–26.0)
CO2: 24 mEq/L (ref 22–29)
Calcium: 9.9 mg/dL (ref 8.4–10.4)
Chloride: 108 mEq/L (ref 98–109)
Creatinine: 1.8 mg/dL — ABNORMAL HIGH (ref 0.7–1.3)
EGFR: 34 mL/min/{1.73_m2} — ABNORMAL LOW (ref >60)
Glucose: 110 mg/dl (ref 70–140)
Potassium: 4.5 mEq/L (ref 3.5–5.1)
Sodium: 138 mEq/L (ref 136–145)
Total Bilirubin: 0.78 mg/dL (ref 0.20–1.20)
Total Protein: 8.5 g/dL — ABNORMAL HIGH (ref 6.4–8.3)

## 2017-08-16 LAB — LACTATE DEHYDROGENASE: LDH: 206 U/L (ref 125–245)

## 2017-08-17 LAB — KAPPA/LAMBDA LIGHT CHAINS
Ig Kappa Free Light Chain: 809.1 mg/L — ABNORMAL HIGH (ref 3.3–19.4)
Ig Lambda Free Light Chain: 11.1 mg/L (ref 5.7–26.3)
Kappa/Lambda FluidC Ratio: 72.89 — ABNORMAL HIGH (ref 0.26–1.65)

## 2017-08-17 LAB — IGG, IGA, IGM
IgA, Qn, Serum: 35 mg/dL — ABNORMAL LOW (ref 61–437)
IgG, Qn, Serum: 2475 mg/dL — ABNORMAL HIGH (ref 700–1600)
IgM, Qn, Serum: 23 mg/dL (ref 15–143)

## 2017-08-17 LAB — BETA 2 MICROGLOBULIN, SERUM: Beta-2: 3 mg/L — ABNORMAL HIGH (ref 0.6–2.4)

## 2017-08-23 ENCOUNTER — Ambulatory Visit: Payer: Medicare Other | Admitting: Internal Medicine

## 2017-08-30 ENCOUNTER — Other Ambulatory Visit (HOSPITAL_COMMUNITY): Payer: Self-pay | Admitting: Urology

## 2017-08-30 DIAGNOSIS — C61 Malignant neoplasm of prostate: Secondary | ICD-10-CM

## 2017-09-04 ENCOUNTER — Ambulatory Visit: Payer: Medicare Other | Admitting: Internal Medicine

## 2017-09-04 ENCOUNTER — Encounter: Payer: Self-pay | Admitting: Internal Medicine

## 2017-09-04 ENCOUNTER — Telehealth: Payer: Self-pay | Admitting: Internal Medicine

## 2017-09-04 VITALS — BP 177/64 | HR 96 | Temp 97.9°F | Resp 18 | Ht 66.0 in | Wt 181.5 lb

## 2017-09-04 DIAGNOSIS — C9000 Multiple myeloma not having achieved remission: Secondary | ICD-10-CM

## 2017-09-04 DIAGNOSIS — I10 Essential (primary) hypertension: Secondary | ICD-10-CM | POA: Diagnosis not present

## 2017-09-04 NOTE — Telephone Encounter (Signed)
Gave avs and calendar for January 2019 °

## 2017-09-04 NOTE — Progress Notes (Signed)
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 2) history of pheochromocytoma LEFT adrenal gland status post resection on 03/16/2009  PRIOR THERAPY: None  CURRENT THERAPY: Observation  INTERVAL HISTORY: Thomas [name] 82 y.o. male the return to the clinic today for follow-up visit accompanied by his wife.  The patient is feeling fine today with no specific complaints except for arthritis in his knees.  He denied having any recent chest pain, shortness of breath, cough or hemoptysis.  He denied having any weight loss or night sweats.  He has no nausea, vomiting, diarrhea or constipation.  He is current on observation.  He had repeat myeloma panel performed recently and he is here for evaluation and discussion of his lab results.  MEDICAL HISTORY: Past Medical History:  Diagnosis Date  . BPH (benign prostatic hypertrophy)   . Chronic kidney disease    Hx of bilateral renal cysts.  . Glaucoma   . Heart murmur   . History of atrial fibrillation 03/09/09  . Hx of epiglottitis    2011  . Hypercholesterolemia   . Hypertension   . Mild aortic stenosis   . Prostate cancer ([facility]) 7/12   T1C Ad Ca  s/p TURP, observation only  . Sleep apnea   . Smoldering multiple myeloma ([facility])     ALLERGIES:  is allergic to benadryl [diphenhydramine hcl]; ceftriaxone sodium; contrast media [iodinated diagnostic agents]; nsaids; penicillins; and levaquin [levofloxacin].  MEDICATIONS:  Current Outpatient Medications  Medication Sig Dispense Refill  . acetaminophen (TYLENOL) 500 MG tablet Take 1,000 mg by mouth at bedtime.    Marland Kitchen amiodarone (PACERONE) 200 MG tablet Take 0.5 tablets (100 mg total) by mouth daily. 45 tablet 3  . amLODipine (NORVASC) 5 MG  tablet TAKE 1 TABLET BY MOUTH EVERY DAY 30 tablet 10  . aspirin 81 MG tablet Take 81 mg by mouth daily.    . brimonidine-timolol (COMBIGAN) 0.2-0.5 % ophthalmic solution Place 1 drop into both eyes 2 (two) times daily.     . Cholecalciferol (VITAMIN D3) 1000 UNITS CAPS Take 1 capsule by mouth daily.     . clobetasol cream (TEMOVATE) 5.17 % Apply 1 application topically 2 (two) times daily as needed (ITCHING).    . furosemide (LASIX) 40 MG tablet Take 40 mg by mouth daily.  3  . levothyroxine (SYNTHROID, LEVOTHROID) 50 MCG tablet TAKE 1 TABLET BY MOUTH DAILY BEFORE BREAKFAST. 90 tablet 0  . TRAVATAN Z 0.004 % SOLN ophthalmic solution Place 1 drop into both eyes at bedtime.     No current facility-administered medications for this visit.     SURGICAL HISTORY:  Past Surgical History:  Procedure Laterality Date  . ADRENALECTOMY  02/12/11   Left, for Pheochromocytoma  . CARDIOVERSION N/A 12/26/2013   Procedure: CARDIOVERSION;  Surgeon: [name], MD;  Location: [facility];  Service: Cardiovascular;  Laterality: N/A;  . CHOLECYSTECTOMY  02/12/11  . GASTRECTOMY  1984   S/P gastrectomy for ulcer  . KNEE SURGERY  1971   S/P Right knee surgery  . TEE WITHOUT CARDIOVERSION N/A 12/26/2013   Procedure: TRANSESOPHAGEAL ECHOCARDIOGRAM (TEE);  Surgeon: [name], MD;  Location: [facility];  Service: Cardiovascular;  Laterality: N/A;  . TRANSURETHRAL RESECTION OF PROSTATE  7/12    REVIEW OF SYSTEMS:  A comprehensive review of systems was negative except for: Musculoskeletal: positive for arthralgias   PHYSICAL EXAMINATION: General appearance: alert, cooperative and no distress Head: Normocephalic, without obvious abnormality, atraumatic Neck: no adenopathy, no JVD, supple, symmetrical, trachea midline and thyroid not enlarged, symmetric, no tenderness/mass/nodules Lymph nodes: Cervical, supraclavicular, and axillary nodes normal. Resp: clear to auscultation bilaterally Back: symmetric, no  curvature. ROM normal. No CVA tenderness. Cardio: regular rate and rhythm, S1, S2 normal, no murmur, click, rub or gallop GI: soft, non-tender; bowel sounds normal; no masses,  no organomegaly Extremities: extremities normal, atraumatic, no cyanosis or edema  ECOG PERFORMANCE STATUS: 1 - Symptomatic but completely ambulatory  Blood pressure (!) 177/64, pulse 96, temperature 97.9 F (36.6 C), temperature source Oral, resp. rate 18, height 5' 6"  (1.676 m), weight 181 lb 8 oz (82.3 kg), SpO2 99 %.  LABORATORY DATA: Lab Results  Component Value Date   WBC 6.9 08/16/2017   HGB 15.6 08/16/2017   HCT 46.3 08/16/2017   MCV 94.9 08/16/2017   PLT 198 08/16/2017      Chemistry      Component Value Date/Time   NA 138 08/16/2017 1404   K 4.5 08/16/2017 1404   CL 103 09/29/2016 1003   CL 109 (H) 03/21/2013 0958   CO2 24 08/16/2017 1404   BUN 17.3 08/16/2017 1404   CREATININE 1.8 (H) 08/16/2017 1404      Component Value Date/Time   CALCIUM 9.9 08/16/2017 1404   ALKPHOS 86 08/16/2017 1404   AST 22 08/16/2017 1404   ALT 16 08/16/2017 1404   BILITOT 0.78 08/16/2017 1404       RADIOGRAPHIC STUDIES: No results found.  ASSESSMENT AND PLAN:  This is a very pleasant 82 years old white male with IgG smoldering [place] diagnosed in April 2011. The patient has been on observation since 2011. His recent myeloma panel showed further increase in the free kappa light chain. I had a lengthy discussion with the patient and his wife today about his current condition and further investigation to rule out conversion to multiple myeloma. I recommended for the patient to have repeat skeletal bone survey as well as CT-guided bone marrow biopsy and aspirate. I will see him back for follow-up visit in 1 month for reevaluation and discussion of his biopsy results as well as recommendation regarding treatment of his condition. For hypertension, the patient was strongly advised to take his blood pressure  medication as prescribed and to reconsult with his primary care physician for adjustment of his medication if needed. He was advised to call immediately if he has any other concerning symptoms in the interval. The patient voices understanding of current disease status and treatment options and is in agreement with the current care plan. All questions were answered. The patient knows to call the clinic with any problems, questions or concerns. We can certainly see the patient much sooner if necessary. I spent 10 minutes counseling the patient face to face. The total time spent in the appointment was 15 minutes.  Disclaimer: This note was dictated with voice recognition software. Similar sounding words can inadvertently be transcribed and may not be corrected upon review.

## 2017-09-12 ENCOUNTER — Encounter (HOSPITAL_COMMUNITY): Payer: Self-pay

## 2017-09-12 ENCOUNTER — Ambulatory Visit (HOSPITAL_COMMUNITY): Payer: Medicare Other

## 2017-09-12 ENCOUNTER — Encounter (HOSPITAL_COMMUNITY): Payer: Medicare Other

## 2017-09-15 ENCOUNTER — Other Ambulatory Visit: Payer: Self-pay | Admitting: Interventional Cardiology

## 2017-09-22 ENCOUNTER — Encounter (HOSPITAL_COMMUNITY)
Admission: RE | Admit: 2017-09-22 | Discharge: 2017-09-22 | Disposition: A | Discharge status: Home / Self Care | Payer: Medicare Other | Source: Ambulatory Visit | Attending: Urology | Admitting: Urology

## 2017-09-22 DIAGNOSIS — C61 Malignant neoplasm of prostate: Secondary | ICD-10-CM

## 2017-09-22 MED ORDER — TECHNETIUM TC 99M MEDRONATE IV KIT
25.0000 | PACK | Freq: Once | INTRAVENOUS | Status: AC | PRN
  Administered 2017-09-22: 21 via INTRAVENOUS

## 2017-09-23 ENCOUNTER — Other Ambulatory Visit: Payer: Self-pay | Admitting: Interventional Cardiology

## 2017-09-28 ENCOUNTER — Ambulatory Visit (HOSPITAL_COMMUNITY)
Admission: RE | Admit: 2017-09-28 | Discharge: 2017-09-28 | Disposition: A | Discharge status: Home / Self Care | Payer: Medicare Other | Source: Ambulatory Visit | Attending: Internal Medicine | Admitting: Internal Medicine

## 2017-09-28 DIAGNOSIS — M170 Bilateral primary osteoarthritis of knee: Secondary | ICD-10-CM | POA: Insufficient documentation

## 2017-09-28 DIAGNOSIS — M19032 Primary osteoarthritis, left wrist: Secondary | ICD-10-CM | POA: Insufficient documentation

## 2017-09-28 DIAGNOSIS — M47817 Spondylosis without myelopathy or radiculopathy, lumbosacral region: Secondary | ICD-10-CM | POA: Insufficient documentation

## 2017-09-28 DIAGNOSIS — C9000 Multiple myeloma not having achieved remission: Secondary | ICD-10-CM

## 2017-09-28 DIAGNOSIS — M47816 Spondylosis without myelopathy or radiculopathy, lumbar region: Secondary | ICD-10-CM | POA: Diagnosis not present

## 2017-09-28 DIAGNOSIS — R948 Abnormal results of function studies of other organs and systems: Secondary | ICD-10-CM | POA: Insufficient documentation

## 2017-09-28 DIAGNOSIS — M19031 Primary osteoarthritis, right wrist: Secondary | ICD-10-CM | POA: Insufficient documentation

## 2017-10-10 ENCOUNTER — Telehealth: Payer: Self-pay | Admitting: Internal Medicine

## 2017-10-10 ENCOUNTER — Inpatient Hospital Stay: Payer: Medicare Other | Attending: Internal Medicine | Admitting: Internal Medicine

## 2017-10-10 ENCOUNTER — Encounter: Payer: Self-pay | Admitting: Internal Medicine

## 2017-10-10 DIAGNOSIS — N181 Chronic kidney disease, stage 1: Secondary | ICD-10-CM

## 2017-10-10 DIAGNOSIS — C9000 Multiple myeloma not having achieved remission: Secondary | ICD-10-CM | POA: Diagnosis not present

## 2017-10-10 DIAGNOSIS — Z8546 Personal history of malignant neoplasm of prostate: Secondary | ICD-10-CM

## 2017-10-10 NOTE — Progress Notes (Signed)
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 2) history of pheochromocytoma LEFT adrenal gland status post resection on 03/16/2009 3) prostate adenocarcinoma  PRIOR THERAPY: None  CURRENT THERAPY: Observation  INTERVAL HISTORY: Thomas [name] 83 y.o. male returns to the clinic today for follow-up visit accompanied by his wife and daughter.  The patient is feeling fine today with no specific complaints.  He denied having any current chest pain, shortness of breath, cough or hemoptysis.  He denied having any fever or chills.  He was a started recently on hormonal therapy with Lupron for history of prostate adenocarcinoma.  He denied having any weight loss or night sweats.  He has no nausea, vomiting, diarrhea or constipation.  He had skeletal bone survey performed recently and he is here for evaluation and discussion of his imaging studies and treatment options.  He was also supposed to have CT guided bone marrow biopsy and aspirate but this was not scheduled as ordered.  MEDICAL HISTORY: Past Medical History:  Diagnosis Date  . BPH (benign prostatic hypertrophy)   . Chronic kidney disease    Hx of bilateral renal cysts.  . Glaucoma   . Heart murmur   . History of atrial fibrillation 03/09/09  . Hx of epiglottitis    2011  . Hypercholesterolemia   . Hypertension   . Mild aortic stenosis   . Prostate cancer ([facility]) 7/12   T1C Ad Ca  s/p TURP, observation only  . Sleep apnea   . Smoldering multiple myeloma ([facility])     ALLERGIES:  is allergic to benadryl [diphenhydramine hcl]; ceftriaxone sodium; contrast media [iodinated diagnostic agents]; nsaids; penicillins; and levaquin [levofloxacin].  MEDICATIONS:  Current Outpatient  Medications  Medication Sig Dispense Refill  . acetaminophen (TYLENOL) 500 MG tablet Take 1,000 mg by mouth at bedtime.    Marland Kitchen amiodarone (PACERONE) 200 MG tablet Take 0.5 tablets (100 mg total) by mouth daily. 45 tablet 3  . amLODipine (NORVASC) 5 MG tablet TAKE 1 TABLET BY MOUTH EVERY DAY 30 tablet 10  . aspirin 81 MG tablet Take 81 mg by mouth daily.    . brimonidine-timolol (COMBIGAN) 0.2-0.5 % ophthalmic solution Place 1 drop into both eyes 2 (two) times daily.     . Cholecalciferol (VITAMIN D3) 1000 UNITS CAPS Take 1 capsule by mouth daily.     . clobetasol cream (TEMOVATE) 4.03 % Apply 1 application topically 2 (two) times daily as needed (ITCHING).    . furosemide (LASIX) 40 MG tablet Take 40 mg by mouth daily.  3  . furosemide (LASIX) 40 MG tablet Take 1 tablet (40 mg total) by mouth daily. 90 tablet 1  . levothyroxine (SYNTHROID, LEVOTHROID) 50 MCG tablet TAKE 1 TABLET BY MOUTH DAILY BEFORE BREAKFAST. 90 tablet 0  . TRAVATAN Z 0.004 % SOLN ophthalmic solution Place 1 drop into both eyes at bedtime.     No current facility-administered medications for this visit.     SURGICAL HISTORY:  Past Surgical History:  Procedure Laterality Date  . ADRENALECTOMY  02/12/11   Left, for Pheochromocytoma  . CARDIOVERSION N/A 12/26/2013   Procedure: CARDIOVERSION;  Surgeon: [name], MD;  Location: [facility];  Service: Cardiovascular;  Laterality: N/A;  . CHOLECYSTECTOMY  02/12/11  .  GASTRECTOMY  1984   S/P gastrectomy for ulcer  . KNEE SURGERY  1971   S/P Right knee surgery  . TEE WITHOUT CARDIOVERSION N/A 12/26/2013   Procedure: TRANSESOPHAGEAL ECHOCARDIOGRAM (TEE);  Surgeon: [name], MD;  Location: [facility];  Service: Cardiovascular;  Laterality: N/A;  . TRANSURETHRAL RESECTION OF PROSTATE  7/12    REVIEW OF SYSTEMS:  A comprehensive review of systems was negative except for: Musculoskeletal: positive for arthralgias   PHYSICAL EXAMINATION: General appearance: alert,  cooperative and no distress Head: Normocephalic, without obvious abnormality, atraumatic Neck: no adenopathy, no JVD, supple, symmetrical, trachea midline and thyroid not enlarged, symmetric, no tenderness/mass/nodules Lymph nodes: Cervical, supraclavicular, and axillary nodes normal. Resp: clear to auscultation bilaterally Back: symmetric, no curvature. ROM normal. No CVA tenderness. Cardio: regular rate and rhythm, S1, S2 normal, no murmur, click, rub or gallop GI: soft, non-tender; bowel sounds normal; no masses,  no organomegaly Extremities: extremities normal, atraumatic, no cyanosis or edema  ECOG PERFORMANCE STATUS: 1 - Symptomatic but completely ambulatory  There were no vitals taken for this visit.  LABORATORY DATA: Lab Results  Component Value Date   WBC 6.9 08/16/2017   HGB 15.6 08/16/2017   HCT 46.3 08/16/2017   MCV 94.9 08/16/2017   PLT 198 08/16/2017      Chemistry      Component Value Date/Time   NA 138 08/16/2017 1404   K 4.5 08/16/2017 1404   CL 103 09/29/2016 1003   CL 109 (H) 03/21/2013 0958   CO2 24 08/16/2017 1404   BUN 17.3 08/16/2017 1404   CREATININE 1.8 (H) 08/16/2017 1404      Component Value Date/Time   CALCIUM 9.9 08/16/2017 1404   ALKPHOS 86 08/16/2017 1404   AST 22 08/16/2017 1404   ALT 16 08/16/2017 1404   BILITOT 0.78 08/16/2017 1404       RADIOGRAPHIC STUDIES: Nm Bone Scan Whole Body  Result Date: 09/22/2017 CLINICAL DATA:  Prostate cancer EXAM: NUCLEAR MEDICINE WHOLE BODY BONE SCAN TECHNIQUE: Whole body anterior and posterior images were obtained approximately 3 hours after intravenous injection of radiopharmaceutical. RADIOPHARMACEUTICALS:  21  mCi Technetium-53mMDP IV COMPARISON:  Skeletal survey 02/23/ 2015, CT 12/04/2015 FINDINGS: Symmetric foci of activity at the bilateral shoulders, suspected to be degenerative. Small foci of activity at the sternomanubrial junction and at the left greater than right costal manubrial junction.  Mild asymmetric right first and second anterior rib activity. Focal activity at the bilateral base of thumb, likely degenerative. Minimal asymmetric activity at the left iliac crest. Right greater than left suspected degenerative activity at the knees. Mild foci of activity at the bilateral feet also likely degenerative. Intense focal activity at the distal aspect of the right tibia. Mild scoliosis of the spine. Mild right paraspinal activity, felt to correspond to degenerative changes and scoliosis noted on CT comparison. Physiologic renal and bladder activity. IMPRESSION: 1. Intense focal activity at the distal aspect of right tibia, could be secondary to trauma or metastatic focus ; radiographic correlation is recommended. 2. Mild asymmetric activity of the right first and second ribs with small foci of sternal activity ; suggest correlation with CT chest 3. Multifocal suspected degenerative activity at the shoulders, hands, knees and feet. Electronically Signed   By: KDonavan FoilM.D.   On: 09/22/2017 15:52   Dg Bone Survey Met  Result Date: 09/28/2017 CLINICAL DATA:  Multiple myeloma, right knee pain EXAM: METASTATIC BONE SURVEY COMPARISON:  Bone scan 09/22/2017, prior bone survey 11/25/2013,  11/02/2012 FINDINGS: Skeletal survey consisting of lateral view of the skull, AP and lateral views of the spine. AP view of the chest pelvis and lower and upper extremities. The previously noted frontal bone lucency is less apparent on the current exam. Previously described humeral shaft lucency on the left is also less apparent. No new lucent lesions are visualized. Multilevel degenerative change of the cervical spine with advanced degenerative changes at C5-C6 and C6-C7 and C7-T1. Scoliosis of the spine with dorsal and lateral osteophytes. Similar appearance of mild adjacent midthoracic vertebral compression. Surgical changes in the epigastric area. Advanced degenerative changes of the lumbar spine most notable at  L4-L5 and L5-S1. Re- demonstrated multiple calcified loose bodies and arthritis at the right knee. Chondrocalcinosis at both knees with moderate arthritis at the left knee. Interim finding of subarticular lucency lateral aspect of the right talar dome. Arthritis at the bilateral wrists and CMC joints. IMPRESSION: 1. New subarticular lucency suspected within the lateral talar dome, likely corresponding to the focus of intense uptake on recent bone scan, this could represent an osteochondral lesion or possible focus of AVN. This would be better evaluated initially with dedicated radiographs of the right ankle with possible MRI follow-up. 2. The previously noted vague lucent lesions in the anterior skull and left humerus are less apparent. There are no new suspicious lytic foci on the current study. 3. Arthritis of the spine, knees, and bilateral wrists Electronically Signed   By: Donavan Foil M.D.   On: 09/28/2017 16:34    ASSESSMENT AND PLAN:  This is a very pleasant 83 years old white male with IgG smoldering Loma diagnosed in April 2011. The patient has been on observation since 2011. His recent myeloma panel showed further increase in the free kappa light chain. His recent skeletal bone survey showed no concerning findings for disease progression.  He has a focus of avascular necrosis but no significant lytic lesions. I recommended for the patient to proceed with the bone marrow biopsy and aspirate as previously recommended and will do it at the cancer center later this week. I will see the patient back for follow-up visit in 2 weeks for reevaluation and more discussion of his treatment options based on the biopsy results. He was advised to call immediately if he has any concerning symptoms in the interval. The patient voices understanding of current disease status and treatment options and is in agreement with the current care plan. All questions were answered. The patient knows to call the clinic  with any problems, questions or concerns. We can certainly see the patient much sooner if necessary. I spent 10 minutes counseling the patient face to face. The total time spent in the appointment was 15 minutes.  Disclaimer: This note was dictated with voice recognition software. Similar sounding words can inadvertently be transcribed and may not be corrected upon review.

## 2017-10-10 NOTE — Telephone Encounter (Signed)
Gave avs and calendar for January  °

## 2017-10-11 ENCOUNTER — Other Ambulatory Visit: Payer: Self-pay | Admitting: Medical Oncology

## 2017-10-11 DIAGNOSIS — C9000 Multiple myeloma not having achieved remission: Secondary | ICD-10-CM

## 2017-10-11 NOTE — Progress Notes (Signed)
Cardiology Office Note    Date:  10/12/2017   ID:  Jonnatan, Hanners Sep 14, 1935, MRN [id number]  PCP:  [name], MD  Cardiologist: [name], MD   Chief Complaint  Patient presents with  . Atrial Fibrillation    History of Present Illness:  Thomas [name] is a 83 y.o. male paroxysmal atrial fibrillation, chronic diastolic heart failure, hypertension, chronic kidney disease, unable to use anticoagulation therapy due to recurrent GU bleeding, and mild aortic stenosis.  Other medical issues include metastatic prostate CA (Lupron) and indolent multiple myeloma.  No cardiac complaints.  He specifically denies syncope and episodes of atrial fibrillation.  He denies orthopnea, PND, and edema.  He has not had chest discomfort.   Past Medical History:  Diagnosis Date  . BPH (benign prostatic hypertrophy)   . Chronic kidney disease    Hx of bilateral renal cysts.  . Glaucoma   . Heart murmur   . History of atrial fibrillation 03/09/09  . Hx of epiglottitis    2011  . Hypercholesterolemia   . Hypertension   . Mild aortic stenosis   . Prostate cancer ([facility]) 7/12   T1C Ad Ca  s/p TURP, observation only  . Sleep apnea   . Smoldering multiple myeloma ([facility])     Past Surgical History:  Procedure Laterality Date  . ADRENALECTOMY  02/12/11   Left, for Pheochromocytoma  . CARDIOVERSION N/A 12/26/2013   Procedure: CARDIOVERSION;  Surgeon: [name], MD;  Location: [facility];  Service: Cardiovascular;  Laterality: N/A;  . CHOLECYSTECTOMY  02/12/11  . GASTRECTOMY  1984   S/P gastrectomy for ulcer  . KNEE SURGERY  1971   S/P Right knee surgery  . TEE WITHOUT CARDIOVERSION N/A 12/26/2013   Procedure: TRANSESOPHAGEAL ECHOCARDIOGRAM (TEE);  Surgeon: [name], MD;  Location: [facility];  Service: Cardiovascular;  Laterality: N/A;  . TRANSURETHRAL RESECTION OF PROSTATE  7/12    Current Medications: Outpatient Medications Prior to Visit  Medication Sig Dispense  Refill  . acetaminophen (TYLENOL) 500 MG tablet Take 1,000 mg by mouth at bedtime.    Marland Kitchen amiodarone (PACERONE) 200 MG tablet Take 0.5 tablets (100 mg total) by mouth daily. 45 tablet 3  . amLODipine (NORVASC) 5 MG tablet TAKE 1 TABLET BY MOUTH EVERY DAY 30 tablet 10  . aspirin 81 MG tablet Take 81 mg by mouth daily.    . brimonidine-timolol (COMBIGAN) 0.2-0.5 % ophthalmic solution Place 1 drop into both eyes 2 (two) times daily.     . Cholecalciferol (VITAMIN D3) 1000 UNITS CAPS Take 1 capsule by mouth daily.     . furosemide (LASIX) 40 MG tablet Take 1 tablet (40 mg total) by mouth daily. 90 tablet 1  . levothyroxine (SYNTHROID, LEVOTHROID) 50 MCG tablet TAKE 1 TABLET BY MOUTH DAILY BEFORE BREAKFAST. 90 tablet 0  . TRAVATAN Z 0.004 % SOLN ophthalmic solution Place 1 drop into both eyes at bedtime.     No facility-administered medications prior to visit.      Allergies:   Benadryl [diphenhydramine hcl]; Ceftriaxone sodium; Contrast media [iodinated diagnostic agents]; Nsaids; Penicillins; and Levaquin [levofloxacin]   Social History   Socioeconomic History  . Marital status: Married    Spouse name: None  . Number of children: 3  . Years of education: None  . Highest education level: None  Social Needs  . Financial resource strain: None  . Food insecurity - worry: None  . Food insecurity - inability:  None  . Transportation needs - medical: None  . Transportation needs - non-medical: None  Occupational History    Employer: RETIRED  Tobacco Use  . Smoking status: Never Smoker  . Smokeless tobacco: Never Used  Substance and Sexual Activity  . Alcohol use: No  . Drug use: No  . Sexual activity: Not Currently  Other Topics Concern  . None  Social History Narrative  . None     Family History:  The patient's family history includes Kidney Stones in his brother; Liver disease in his sister, sister, and sister; Melanoma in his brother; Polymyositis in his mother; Prostate cancer in  his brother and father.   ROS:   Please see the history of present illness.    No specific complaints.  No side effects from Lupron to this point. All other systems reviewed and are negative.   PHYSICAL EXAM:   VS:  BP 140/80   Pulse (!) 52   Ht 5' 6"  (1.676 m)   Wt 179 lb (81.2 kg)   BMI 28.89 kg/m    GEN: Well nourished, well developed, in no acute distress  HEENT: normal  Neck: no JVD, carotid bruits, or masses Cardiac: RRR; 3/6 systolic murmur,  rubs, or gallops,no edema . Respiratory:  clear to auscultation bilaterally, normal work of breathing GI: soft, nontender, nondistended, + BS MS: no deformity or atrophy  Skin: warm and dry, no rash Neuro:  Alert and Oriented x 3, Strength and sensation are intact Psych: euthymic mood, full affect  Wt Readings from Last 3 Encounters:  10/12/17 179 lb (81.2 kg)  09/04/17 181 lb 8 oz (82.3 kg)  03/29/17 196 lb 6.4 oz (89.1 kg)      Studies/Labs Reviewed:   EKG:  EKG sinus bradycardia, borderline first-degree AV block, otherwise unremarkable tracing.  When compared to February 2018, the heart rate is slightly slower.  Otherwise no change.  Recent Labs: 03/29/2017: TSH 4.500 08/16/2017: ALT 16; BUN 17.3; Creatinine 1.8; HGB 15.6; Platelets 198; Potassium 4.5; Sodium 138   Lipid Panel No results found for: CHOL, TRIG, HDL, CHOLHDL, VLDL, LDLCALC, LDLDIRECT  Additional studies/ records that were reviewed today include:  Recent laboratory data demonstrates no significant enzyme elevation.  TSH in June was normal.    ASSESSMENT:    1. Chronic diastolic heart failure ([facility])   2. Essential hypertension   3. Mild aortic stenosis   4. Paroxysmal atrial fibrillation ([facility])   5. Sinus node dysfunction ([facility])   6. OSA on CPAP   7. On amiodarone therapy      PLAN:  In order of problems listed above:  1. No evidence of volume overload.  Continue to monitor. 2. Fluctuations in blood pressure that are adequate currently.  I  reviewed recordings from home most of which range less than 329 mmHg systolic. 3. Echo last performed 2015.  Exam suggests that aortic stenosis is worse than mild currently although probably not critical.  2D echocardiogram should be done prior to the next office visit in 6 months. 4. Currently controlled on amiodarone 100 mg/day. 5. Sinus bradycardia is noted but no change in therapy indicated currently. 6. We did not discuss sleep apnea CPAP compliance. 7. TSH and liver panel will need to be performed on the next office visit in July.  Clinical follow-up in 6 months.  Echocardiogram should be done prior to the office visit.  Laboratory data and perhaps chest x-ray need to be performed to monitor amiodarone therapy.  He is  on low-dose therapy.    Medication Adjustments/Labs and Tests Ordered: Current medicines are reviewed at length with the patient today.  Concerns regarding medicines are outlined above.  Medication changes, Labs and Tests ordered today are listed in the Patient Instructions below. Patient Instructions  Medication Instructions:  Your physician recommends that you continue on your current medications as directed. Please refer to the Current Medication list given to you today.  Labwork: None  Testing/Procedures: Your physician has requested that you have an echocardiogram. Echocardiography is a painless test that uses sound waves to create images of your heart. It provides your doctor with information about the size and shape of your heart and how well your heart's chambers and valves are working. This procedure takes approximately one hour. There are no restrictions for this procedure.  Follow-Up: Your physician wants you to follow-up in: 6 months with Dr. Tamala Julian.  You will receive a reminder letter in the mail two months in advance. If you don't receive a letter, please call our office to schedule the follow-up appointment.   Any Other Special Instructions Will Be Listed  Below (If Applicable).     If you need a refill on your cardiac medications before your next appointment, please call your pharmacy.      Signed, [name], MD  10/12/2017 12:37 PM    [facility] [address], [place], [place]  [address] Phone: [phone]; Fax: [phone]

## 2017-10-12 ENCOUNTER — Ambulatory Visit: Payer: Medicare Other | Admitting: Interventional Cardiology

## 2017-10-12 ENCOUNTER — Encounter: Payer: Self-pay | Admitting: Interventional Cardiology

## 2017-10-12 VITALS — BP 140/80 | HR 52 | Ht 66.0 in | Wt 179.0 lb

## 2017-10-12 DIAGNOSIS — I10 Essential (primary) hypertension: Secondary | ICD-10-CM

## 2017-10-12 DIAGNOSIS — I495 Sick sinus syndrome: Secondary | ICD-10-CM

## 2017-10-12 DIAGNOSIS — Z9989 Dependence on other enabling machines and devices: Secondary | ICD-10-CM | POA: Diagnosis not present

## 2017-10-12 DIAGNOSIS — G4733 Obstructive sleep apnea (adult) (pediatric): Secondary | ICD-10-CM | POA: Diagnosis not present

## 2017-10-12 DIAGNOSIS — I5032 Chronic diastolic (congestive) heart failure: Secondary | ICD-10-CM | POA: Diagnosis not present

## 2017-10-12 DIAGNOSIS — Z79899 Other long term (current) drug therapy: Secondary | ICD-10-CM | POA: Diagnosis not present

## 2017-10-12 DIAGNOSIS — I480 Paroxysmal atrial fibrillation: Secondary | ICD-10-CM

## 2017-10-12 DIAGNOSIS — I350 Nonrheumatic aortic (valve) stenosis: Secondary | ICD-10-CM

## 2017-10-12 NOTE — Patient Instructions (Signed)
Medication Instructions:  Your physician recommends that you continue on your current medications as directed. Please refer to the Current Medication list given to you today.  Labwork: None  Testing/Procedures: Your physician has requested that you have an echocardiogram. Echocardiography is a painless test that uses sound waves to create images of your heart. It provides your doctor with information about the size and shape of your heart and how well your heart's chambers and valves are working. This procedure takes approximately one hour. There are no restrictions for this procedure.  Follow-Up: Your physician wants you to follow-up in: 6 months with Dr. Tamala Julian.  You will receive a reminder letter in the mail two months in advance. If you don't receive a letter, please call our office to schedule the follow-up appointment.   Any Other Special Instructions Will Be Listed Below (If Applicable).     If you need a refill on your cardiac medications before your next appointment, please call your pharmacy.

## 2017-10-13 ENCOUNTER — Inpatient Hospital Stay (HOSPITAL_BASED_OUTPATIENT_CLINIC_OR_DEPARTMENT_OTHER): Payer: Medicare Other | Admitting: Hematology and Oncology

## 2017-10-13 ENCOUNTER — Inpatient Hospital Stay: Payer: Medicare Other

## 2017-10-13 VITALS — BP 140/74 | HR 53 | Temp 97.6°F | Resp 19

## 2017-10-13 DIAGNOSIS — C9000 Multiple myeloma not having achieved remission: Secondary | ICD-10-CM | POA: Diagnosis not present

## 2017-10-13 LAB — CBC WITH DIFFERENTIAL (CANCER CENTER ONLY)
Basophils Absolute: 0 10*3/uL (ref 0.0–0.1)
Basophils Relative: 0 %
Eosinophils Absolute: 0.2 10*3/uL (ref 0.0–0.5)
Eosinophils Relative: 3 %
HCT: 46.2 % (ref 38.4–49.9)
Hemoglobin: 16.1 g/dL (ref 13.0–17.1)
Lymphocytes Relative: 28 %
Lymphs Abs: 1.8 10*3/uL (ref 0.9–3.3)
MCH: 32.2 pg (ref 27.2–33.4)
MCHC: 34.8 g/dL (ref 32.0–36.0)
MCV: 92.4 fL (ref 79.3–98.0)
Monocytes Absolute: 0.5 10*3/uL (ref 0.1–0.9)
Monocytes Relative: 7 %
Neutro Abs: 3.9 10*3/uL (ref 1.5–6.5)
Neutrophils Relative %: 62 %
Platelet Count: 202 10*3/uL (ref 140–400)
RBC: 5 MIL/uL (ref 4.20–5.82)
RDW: 14 % (ref 11.0–15.6)
WBC Count: 6.4 10*3/uL (ref 4.0–10.3)

## 2017-10-13 LAB — CMP (CANCER CENTER ONLY)
ALT: 18 U/L (ref 0–55)
AST: 19 U/L (ref 5–34)
Albumin: 4.4 g/dL (ref 3.5–5.0)
Alkaline Phosphatase: 93 U/L (ref 40–150)
Anion gap: 8 (ref 3–11)
BUN: 25 mg/dL (ref 7–26)
CO2: 29 mmol/L (ref 22–29)
Calcium: 9.9 mg/dL (ref 8.4–10.4)
Chloride: 102 mmol/L (ref 98–109)
Creatinine: 2.33 mg/dL — ABNORMAL HIGH (ref 0.70–1.30)
GFR, Est AFR Am: 29 mL/min — ABNORMAL LOW (ref >60)
GFR, Estimated: 25 mL/min — ABNORMAL LOW (ref >60)
Glucose, Bld: 95 mg/dL (ref 70–140)
Potassium: 3.7 mmol/L (ref 3.5–5.1)
Sodium: 139 mmol/L (ref 136–145)
Total Bilirubin: 1.2 mg/dL (ref 0.2–1.2)
Total Protein: 8.8 g/dL — ABNORMAL HIGH (ref 6.4–8.3)

## 2017-10-13 LAB — LACTATE DEHYDROGENASE: LDH: 221 U/L (ref 125–245)

## 2017-10-13 NOTE — Progress Notes (Signed)
Patient remained stable during post-bone marrow biopsy observation period. Bandage changed due to being loose. Scant, old, dry drainage noted on bandage that was removed. Patient denies concerns or complaints on exit. Encouraged to call with any questions or concerns. Patient verbalized understanding.

## 2017-10-13 NOTE — Progress Notes (Addendum)
INDICATION: Multiple Myeloma  Brief examination was performed. ENT: adequate airway clearance Heart: regular rate and rhythm.No Murmurs Lungs: clear to auscultation, no wheezes, normal respiratory effort  Bone Marrow Biopsy and Aspiration Procedure Note   Informed consent was obtained and potential risks including bleeding, infection and pain were reviewed with the patient.  The patient's name, date of birth, identification, consent and allergies were verified prior to the start of procedure and time out was performed.  The left posterior iliac crest was chosen as the site of biopsy.  The skin was prepped with ChloraPrep.   8 cc of 2% lidocaine was used to provide local anaesthesia.   10 cc of bone marrow aspirate was obtained followed by 1cm biopsy.  Pressure was applied to the biopsy site and bandage was placed over the biopsy site. Patient was made to lie on the back for 15 mins prior to discharge.  The procedure was tolerated well. COMPLICATIONS: None BLOOD LOSS: none The patient was discharged home in stable condition with a 1 week follow up to review results.  Patient was provided with post bone marrow biopsy instructions and instructed to call if there was any bleeding or worsening pain.  Specimens sent for flow cytometry, cytogenetics, myeloma panel and additional studies.  Signed Scot Dock, NP  ADDENDUM: Supervising attending note I supervised the bone marrow procedure from consent until the end of the procedure.  Patient tolerated the procedure extremely well. There were no complications or blood loss. Patient will have a follow-up with Dr. Julien Nordmann in about a week.

## 2017-10-13 NOTE — Patient Instructions (Signed)
Bone Marrow Aspiration and Bone Marrow Biopsy, Adult, Care After This sheet gives you information about how to care for yourself after your procedure. Your health care provider may also give you more specific instructions. If you have problems or questions, contact your health care provider. What can I expect after the procedure? After the procedure, it is common to have:  Mild pain and tenderness.  Swelling.  Bruising.  Follow these instructions at home:  Take over-the-counter or prescription medicines only as told by your health care provider.  Do not take baths, swim, or use a hot tub until your health care provider approves. Ask if you can take a shower or have a sponge bath.  Follow instructions from your health care provider about how to take care of the puncture site. Make sure you: ? Wash your hands with soap and water before you change your bandage (dressing). If soap and water are not available, use hand sanitizer. ? Change your dressing as told by your health care provider.  Check your puncture siteevery day for signs of infection. Check for: ? More redness, swelling, or pain. ? More fluid or blood. ? Warmth. ? Pus or a bad smell.  Return to your normal activities as told by your health care provider. Ask your health care provider what activities are safe for you.  Do not drive for 24 hours if you were given a medicine to help you relax (sedative).  Keep all follow-up visits as told by your health care provider. This is important. Contact a health care provider if:  You have more redness, swelling, or pain around the puncture site.  You have more fluid or blood coming from the puncture site.  Your puncture site feels warm to the touch.  You have pus or a bad smell coming from the puncture site.  You have a fever.  Your pain is not controlled with medicine. This information is not intended to replace advice given to you by your health care provider. Make sure  you discuss any questions you have with your health care provider. Document Released: 04/08/2005 Document Revised: 04/08/2016 Document Reviewed: 03/02/2016 Elsevier Interactive Patient Education  2018 [employer].

## 2017-10-14 LAB — IGG, IGA, IGM
IgA: 40 mg/dL — ABNORMAL LOW (ref 61–437)
IgG (Immunoglobin G), Serum: 2633 mg/dL — ABNORMAL HIGH (ref 700–1600)
IgM (Immunoglobulin M), Srm: 20 mg/dL (ref 15–143)

## 2017-10-15 ENCOUNTER — Other Ambulatory Visit: Payer: Self-pay | Admitting: Interventional Cardiology

## 2017-10-15 LAB — BETA 2 MICROGLOBULIN, SERUM: Beta-2 Microglobulin: 3.9 mg/L — ABNORMAL HIGH (ref 0.6–2.4)

## 2017-10-16 ENCOUNTER — Other Ambulatory Visit: Payer: Self-pay

## 2017-10-16 ENCOUNTER — Ambulatory Visit (HOSPITAL_COMMUNITY): Payer: Medicare Other | Attending: Cardiovascular Disease

## 2017-10-16 DIAGNOSIS — I495 Sick sinus syndrome: Secondary | ICD-10-CM | POA: Diagnosis not present

## 2017-10-16 DIAGNOSIS — E785 Hyperlipidemia, unspecified: Secondary | ICD-10-CM | POA: Diagnosis not present

## 2017-10-16 DIAGNOSIS — I110 Hypertensive heart disease with heart failure: Secondary | ICD-10-CM | POA: Insufficient documentation

## 2017-10-16 DIAGNOSIS — C9000 Multiple myeloma not having achieved remission: Secondary | ICD-10-CM | POA: Insufficient documentation

## 2017-10-16 DIAGNOSIS — I350 Nonrheumatic aortic (valve) stenosis: Secondary | ICD-10-CM | POA: Diagnosis not present

## 2017-10-16 DIAGNOSIS — I509 Heart failure, unspecified: Secondary | ICD-10-CM | POA: Diagnosis not present

## 2017-10-16 DIAGNOSIS — I083 Combined rheumatic disorders of mitral, aortic and tricuspid valves: Secondary | ICD-10-CM | POA: Insufficient documentation

## 2017-10-16 DIAGNOSIS — G4733 Obstructive sleep apnea (adult) (pediatric): Secondary | ICD-10-CM | POA: Insufficient documentation

## 2017-10-16 LAB — KAPPA/LAMBDA LIGHT CHAINS
Kappa free light chain: 856.9 mg/L — ABNORMAL HIGH (ref 3.3–19.4)
Kappa, lambda light chain ratio: 75.83 — ABNORMAL HIGH (ref 0.26–1.65)
Lambda free light chains: 11.3 mg/L (ref 5.7–26.3)

## 2017-10-20 ENCOUNTER — Other Ambulatory Visit: Payer: Self-pay | Admitting: Interventional Cardiology

## 2017-10-20 MED ORDER — FUROSEMIDE 40 MG PO TABS: 40.0000 mg | ORAL_TABLET | Freq: Every day | ORAL | 3 refills | Status: DC

## 2017-10-20 MED ORDER — AMIODARONE HCL 200 MG PO TABS: ORAL_TABLET | ORAL | 3 refills | Status: DC

## 2017-10-20 MED ORDER — LEVOTHYROXINE SODIUM 50 MCG PO TABS: ORAL_TABLET | ORAL | 3 refills | Status: DC

## 2017-10-23 ENCOUNTER — Other Ambulatory Visit: Payer: Self-pay | Admitting: *Deleted

## 2017-10-23 MED ORDER — AMLODIPINE BESYLATE 5 MG PO TABS: 5.0000 mg | ORAL_TABLET | Freq: Every day | ORAL | 2 refills | Status: DC

## 2017-10-24 ENCOUNTER — Encounter: Payer: Self-pay | Admitting: Internal Medicine

## 2017-10-24 ENCOUNTER — Telehealth: Payer: Self-pay | Admitting: Internal Medicine

## 2017-10-24 ENCOUNTER — Inpatient Hospital Stay: Payer: Medicare Other | Admitting: Internal Medicine

## 2017-10-24 ENCOUNTER — Telehealth: Payer: Self-pay | Admitting: Pharmacy Technician

## 2017-10-24 ENCOUNTER — Telehealth: Payer: Self-pay | Admitting: Pharmacist

## 2017-10-24 VITALS — BP 163/61 | HR 52 | Temp 98.0°F | Resp 18 | Ht 66.0 in | Wt 178.6 lb

## 2017-10-24 DIAGNOSIS — C9000 Multiple myeloma not having achieved remission: Secondary | ICD-10-CM | POA: Diagnosis not present

## 2017-10-24 DIAGNOSIS — Z8546 Personal history of malignant neoplasm of prostate: Secondary | ICD-10-CM | POA: Diagnosis not present

## 2017-10-24 DIAGNOSIS — I10 Essential (primary) hypertension: Secondary | ICD-10-CM

## 2017-10-24 DIAGNOSIS — Z5111 Encounter for antineoplastic chemotherapy: Secondary | ICD-10-CM

## 2017-10-24 DIAGNOSIS — Z7189 Other specified counseling: Secondary | ICD-10-CM

## 2017-10-24 MED ORDER — DEXAMETHASONE 4 MG PO TABS: ORAL_TABLET | ORAL | 4 refills | Status: DC

## 2017-10-24 MED ORDER — WARFARIN SODIUM 2 MG PO TABS: 2.0000 mg | ORAL_TABLET | Freq: Every day | ORAL | 4 refills | Status: DC

## 2017-10-24 MED ORDER — LENALIDOMIDE 10 MG PO CAPS: 10.0000 mg | ORAL_CAPSULE | Freq: Every day | ORAL | 0 refills | Status: DC

## 2017-10-24 MED ORDER — PROCHLORPERAZINE MALEATE 10 MG PO TABS: 10.0000 mg | ORAL_TABLET | Freq: Four times a day (QID) | ORAL | 0 refills | Status: DC | PRN

## 2017-10-24 MED ORDER — ACYCLOVIR 200 MG PO CAPS: 200.0000 mg | ORAL_CAPSULE | Freq: Two times a day (BID) | ORAL | 4 refills | Status: DC

## 2017-10-24 NOTE — Telephone Encounter (Signed)
Gave avs and calendar for January - march °

## 2017-10-24 NOTE — Telephone Encounter (Addendum)
Oral Chemotherapy Pharmacist Encounter   I spoke with patient, along with wife and daughter, in exam room for overview of: Revlimid.   Pt is doing well. Counseled patient on administration, dosing, side effects, monitoring, drug-food interactions, safe handling, storage, and disposal.  Patient will take Revlimid 10mg  capsules, 1 capsule by mouth once daily, without regard to food, with a full glass of water. Revlimid will be given 21 days on, 7 days off, repeat every 28 days.  Patient will take dexamethasone 4mg  tablets, 5 tablets (20mg ) by mouth once weekly with breakfast. It will be administered on the days of Velcade infusion.  Revlimid start date: planned for 11/07/17  Side effects of Revlimid include but not limited to: nausea, constipation, diarrhea, abdominal pain, rash, fatigue, drug fever, and decreased blood counts.    Reviewed with patient importance of keeping a medication schedule and plan for any missed doses.  Mr. Gappa voiced understanding and appreciation.   Medication reconciliation performed and medication/allergy list updated.  Acyclovir and dexamethasone prescriptions have been sent to patient's local pharmacy. Patient will pick up prescriptions and will wait until cycle 1 day 1 prior to starting dexamethasone.  Patient with questions regarding need for acid suppression with high dose pulse steroids due to history of bleeding ulcer. Patient will take ranitidine once on the days of dexamethasone only.  Patient counseled on importance of daily aspirin 81mg  for VTE prophylaxis.  Provided phone number to Oral Oncology Clinic and [place]. Explained specialty pharmacy and [facility] processes.  All questions answered.  Patient knows to call the office with questions or concerns. Oral Oncology Clinic will continue to follow.  Johny Drilling, PharmD, BCPS, BCOP 10/24/2017    4:37 PM Oral Oncology Clinic [phone]

## 2017-10-24 NOTE — Telephone Encounter (Signed)
Oral Oncology Patient Advocate Encounter  Prior Authorization for Revlimid has been approved.    PA# [id number] Effective dates: 10/24/2017 through 10/02/2018  Oral Oncology Clinic will continue to follow.   Fabio Asa. Melynda Keller, [place] Patient [place] [phone] 10/24/2017 2:49 PM

## 2017-10-24 NOTE — Telephone Encounter (Signed)
Oral Oncology Pharmacist Encounter  Received new prescription for Revlimid (lenalidomide) for the treatment of multiple myeloma in conjunction with Velcade and dexamethasone, planned duration to be determined based on response.  Labs from 10/13/17 assessed, OK for treatment. Noted chronic renal insufficiency with worsening renal function. SCr=2.33, est CrCl~25 mL/min, d/w MD, Revlimid will be dose adjusted to 61m daily for 21 days on, 7 days off.  Current medication list in Epic reviewed, no significant DDIs with Revlimid identified. Patient will be counseled about the importance of aspirin and acyclovir use.  Prescription [facility] [place] as Revlimid is a limited distribution medication.  Oral Oncology Clinic will continue to follow for insurance authorization, copayment issues, initial counseling and start date.  JJohny Drilling PharmD, BCPS, BCOP 10/24/2017 3:44 PM Oral Oncology Clinic [phone]

## 2017-10-24 NOTE — Telephone Encounter (Signed)
Oral Oncology Patient Advocate Encounter  Prior Authorization request submitted for Revlimid.   PA submitted on CoverMyMeds Key [id number] Status is pending  Oral Oncology Clinic will continue to follow.  Fabio Asa. Melynda Keller, [place] Patient [place] [phone] 10/24/2017 2:44 PM'

## 2017-10-24 NOTE — Progress Notes (Signed)
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 2) History of pheochromocytoma LEFT adrenal gland status post resection on 03/16/2009 3) Prostate adenocarcinoma  PRIOR THERAPY: None  CURRENT THERAPY: Systemic therapy with weekly Velcade 1.3 mg/KG subcutaneously, Revlimid 25 mg p.o. daily for 21 days every 4 weeks as well as Decadron 20 mg p.o. weekly.  First dose November 07, 2017.  INTERVAL HISTORY: Thomas [name] 83 y.o. male returns to the clinic today for follow-up visit accompanied by his wife and daughter.  The patient is feeling fine today with no specific complaints.  He recently underwent a bone marrow biopsy and aspirate that showed plasma cell neoplasm with plasma cells accounting for 15%.  He denied having any current chest pain, shortness of breath, cough or hemoptysis.  He denied having any fever or chills.  He has no nausea, vomiting, diarrhea or constipation.  He has no recent weight loss or night sweats.  The patient is here today for evaluation and discussion of his treatment options based on the recent bone marrow biopsy and aspirate results.  MEDICAL HISTORY: Past Medical History:  Diagnosis Date  . BPH (benign prostatic hypertrophy)   . Chronic kidney disease    Hx of bilateral renal cysts.  . Glaucoma   . Heart murmur   . History of atrial fibrillation 03/09/09  . Hx of epiglottitis    2011  . Hypercholesterolemia   . Hypertension   . Mild aortic stenosis   . Prostate cancer ([facility]) 7/12   T1C Ad Ca  s/p TURP, observation only  . Sleep apnea   . Smoldering multiple myeloma ([facility])     ALLERGIES:  is allergic to benadryl [diphenhydramine hcl]; ceftriaxone sodium; contrast media [iodinated diagnostic agents];  nsaids; penicillins; and levaquin [levofloxacin].  MEDICATIONS:  Current Outpatient Medications  Medication Sig Dispense Refill  . acetaminophen (TYLENOL) 500 MG tablet Take 1,000 mg by mouth at bedtime.    Marland Kitchen amiodarone (PACERONE) 200 MG tablet TAKE 1/2 TABLETS BY MOUTH DAILY. 45 tablet 3  . amLODipine (NORVASC) 5 MG tablet Take 1 tablet (5 mg total) by mouth daily. 90 tablet 2  . aspirin 81 MG tablet Take 81 mg by mouth daily.    . brimonidine-timolol (COMBIGAN) 0.2-0.5 % ophthalmic solution Place 1 drop into both eyes 2 (two) times daily.     . Cholecalciferol (VITAMIN D3) 1000 UNITS CAPS Take 1 capsule by mouth daily.     . furosemide (LASIX) 40 MG tablet Take 1 tablet (40 mg total) by mouth daily. 90 tablet 3  . levothyroxine (SYNTHROID, LEVOTHROID) 50 MCG tablet TAKE 1 TABLET BY MOUTH DAILY BEFORE BREAKFAST. 90 tablet 3  . TRAVATAN Z 0.004 % SOLN ophthalmic solution Place 1 drop into both eyes at bedtime.    Marland Kitchen acyclovir (ZOVIRAX) 200 MG capsule Take 1 capsule (200 mg total) by mouth 2 (two) times daily. 60 capsule 4  . dexamethasone (DECADRON) 4 MG tablet 5 tablets p.o. every week start with the first dose of Velcade. 80 tablet 4  . DUREZOL 0.05 % EMUL INSTILL 1 DROP INTO RIGHT EYE 3 TIMES A DAY  1  . prochlorperazine (COMPAZINE) 10 MG tablet Take 1 tablet (10 mg total) by mouth every 6 (six) hours as  needed for nausea or vomiting. 30 tablet 0  . PROLENSA 0.07 % SOLN INSTILL 1 DROP INTO RIGHT EYE AT BEDTIME  1  . tobramycin (TOBREX) 0.3 % ophthalmic solution INSTILL 1 DROP INTO RIGHT EYE 4 TIMES A DAY  1  . warfarin (COUMADIN) 2 MG tablet Take 1 tablet (2 mg total) by mouth daily. 30 tablet 4   No current facility-administered medications for this visit.     SURGICAL HISTORY:  Past Surgical History:  Procedure Laterality Date  . ADRENALECTOMY  02/12/11   Left, for Pheochromocytoma  . CARDIOVERSION N/A 12/26/2013   Procedure: CARDIOVERSION;  Surgeon: [name], MD;  Location: [facility];  Service: Cardiovascular;  Laterality: N/A;  . CHOLECYSTECTOMY  02/12/11  . GASTRECTOMY  1984   S/P gastrectomy for ulcer  . KNEE SURGERY  1971   S/P Right knee surgery  . TEE WITHOUT CARDIOVERSION N/A 12/26/2013   Procedure: TRANSESOPHAGEAL ECHOCARDIOGRAM (TEE);  Surgeon: [name], MD;  [facility] ENDOSCOPY;  Service: Cardiovascular;  Laterality: N/A;  . TRANSURETHRAL RESECTION OF PROSTATE  7/12    REVIEW OF SYSTEMS:  Constitutional: negative Eyes: negative Ears, nose, mouth, throat, and face: negative Respiratory: negative Cardiovascular: negative Gastrointestinal: negative Genitourinary:negative Integument/breast: negative Hematologic/lymphatic: negative Musculoskeletal:positive for arthralgias Neurological: negative Behavioral/Psych: negative Endocrine: negative Allergic/Immunologic: negative   PHYSICAL EXAMINATION: General appearance: alert, cooperative and no distress Head: Normocephalic, without obvious abnormality, atraumatic Neck: no adenopathy, no JVD, supple, symmetrical, trachea midline and thyroid not enlarged, symmetric, no tenderness/mass/nodules Lymph nodes: Cervical, supraclavicular, and axillary nodes normal. Resp: clear to auscultation bilaterally Back: symmetric, no curvature. ROM normal. No CVA tenderness. Cardio: regular rate and rhythm, S1, S2 normal, no murmur, click, rub or gallop GI: soft, non-tender; bowel sounds normal; no masses,  no organomegaly Extremities: extremities normal, atraumatic, no cyanosis or edema Neurologic: Alert and oriented X 3, normal strength and tone. Normal symmetric reflexes. Normal coordination and gait  ECOG PERFORMANCE STATUS: 1 - Symptomatic but completely ambulatory  Blood pressure (!) 163/61, pulse (!) 52, temperature 98 F (36.7 C), temperature source Oral, resp. rate 18, height 5' 6" (1.676 m), weight 178 lb 9.6 oz (81 kg), SpO2 100 %.  LABORATORY DATA: Lab Results  Component Value Date   WBC 6.4  10/13/2017   HGB 15.6 08/16/2017   HCT 46.2 10/13/2017   MCV 92.4 10/13/2017   PLT 202 10/13/2017      Chemistry      Component Value Date/Time   NA 139 10/13/2017 0914   NA 138 08/16/2017 1404   K 3.7 10/13/2017 0914   K 4.5 08/16/2017 1404   CL 102 10/13/2017 0914   CL 109 (H) 03/21/2013 0958   CO2 29 10/13/2017 0914   CO2 24 08/16/2017 1404   BUN 25 10/13/2017 0914   BUN 17.3 08/16/2017 1404   CREATININE 1.8 (H) 08/16/2017 1404      Component Value Date/Time   CALCIUM 9.9 10/13/2017 0914   CALCIUM 9.9 08/16/2017 1404   ALKPHOS 93 10/13/2017 0914   ALKPHOS 86 08/16/2017 1404   AST 19 10/13/2017 0914   AST 22 08/16/2017 1404   ALT 18 10/13/2017 0914   ALT 16 08/16/2017 1404   BILITOT 1.2 10/13/2017 0914   BILITOT 0.78 08/16/2017 1404       RADIOGRAPHIC STUDIES: Dg Bone Survey Met  Result Date: 09/28/2017 CLINICAL DATA:  Multiple myeloma, right knee pain EXAM: METASTATIC BONE SURVEY COMPARISON:  Bone scan 09/22/2017, prior bone survey 11/25/2013, 11/02/2012 FINDINGS:  Skeletal survey consisting of lateral view of the skull, AP and lateral views of the spine. AP view of the chest pelvis and lower and upper extremities. The previously noted frontal bone lucency is less apparent on the current exam. Previously described humeral shaft lucency on the left is also less apparent. No new lucent lesions are visualized. Multilevel degenerative change of the cervical spine with advanced degenerative changes at C5-C6 and C6-C7 and C7-T1. Scoliosis of the spine with dorsal and lateral osteophytes. Similar appearance of mild adjacent midthoracic vertebral compression. Surgical changes in the epigastric area. Advanced degenerative changes of the lumbar spine most notable at L4-L5 and L5-S1. Re- demonstrated multiple calcified loose bodies and arthritis at the right knee. Chondrocalcinosis at both knees with moderate arthritis at the left knee. Interim finding of subarticular lucency lateral  aspect of the right talar dome. Arthritis at the bilateral wrists and CMC joints. IMPRESSION: 1. New subarticular lucency suspected within the lateral talar dome, likely corresponding to the focus of intense uptake on recent bone scan, this could represent an osteochondral lesion or possible focus of AVN. This would be better evaluated initially with dedicated radiographs of the right ankle with possible MRI follow-up. 2. The previously noted vague lucent lesions in the anterior skull and left humerus are less apparent. There are no new suspicious lytic foci on the current study. 3. Arthritis of the spine, knees, and bilateral wrists Electronically Signed   By: Donavan Foil M.D.   On: 09/28/2017 16:34    ASSESSMENT AND PLAN:  This is a very pleasant 83 years old white male with IgG smoldering Loma diagnosed in April 2011. The patient has been on observation since 2011. His recent myeloma panel showed further increase in the free kappa light chain. His recent skeletal bone survey showed no concerning findings for disease progression.  He has a focus of avascular necrosis but no significant lytic lesions. The recent bone marrow biopsy and aspirate showed further increase in the plasma cells to 15%. I had a lengthy discussion with the patient and his family today about his current condition and treatment options.  I gave him the option of continuous observation and close monitoring versus consideration of starting systemic treatment with subcutaneous Velcade 1.3 mg/KG weekly with Decadron 20 mg p.o. weekly as well as Revlimid 25 mg p.o. daily for 21 days every 4 weeks. I discussed with the patient the adverse effect of this treatment.  He will see the oral pharmacist for more detailed discussion of this treatment options and also to help the patient refill his medication with Revlimid.  The patient is interested in proceeding with the treatment. I will send prescription of Decadron, Compazine, acyclovir and  Coumadin to his pharmacy. He is expected to start the first dose of this treatment on November 07, 2017. I will arrange for the patient to have a chemotherapy education class before the first dose of his treatment. He was advised to call immediately if he has any concerning symptoms in the interval. The patient voices understanding of current disease status and treatment options and is in agreement with the current care plan. All questions were answered. The patient knows to call the clinic with any problems, questions or concerns. We can certainly see the patient much sooner if necessary. I spent 15 minutes counseling the patient face to face. The total time spent in the appointment was 25 minutes.  Disclaimer: This note was dictated with voice recognition software. Similar sounding words can inadvertently be  transcribed and may not be corrected upon review.

## 2017-10-24 NOTE — Progress Notes (Signed)
START ON PATHWAY REGIMEN - Multiple Myeloma and Other Plasma Cell Dyscrasias     A cycle is every 21 days:     Bortezomib      Lenalidomide      Dexamethasone   **Always confirm dose/schedule in your pharmacy ordering system**  Patient Characteristics: Newly Diagnosed, Transplant Ineligible or Refused, Standard Risk R-ISS Staging: II Disease Classification: Newly Diagnosed Is Patient Eligible for Transplant<= Transplant Ineligible or Refused Risk Status: Standard Risk Intent of Therapy: Non-Curative / Palliative Intent, Discussed with Patient 

## 2017-10-26 NOTE — Telephone Encounter (Signed)
Oral Oncology Patient Advocate Encounter  Confirmed with Thomas [name] that the patient's Revlimid will be delivered to his home today 10/26/2017.   Thomas [name]. Thomas [name], [place] Patient [place] [phone] 10/26/2017 2:30 PM

## 2017-10-27 ENCOUNTER — Telehealth: Payer: Self-pay | Admitting: Interventional Cardiology

## 2017-10-27 NOTE — Telephone Encounter (Signed)
New message   Patient says that he has Chemo therapy and they want him on Coumadin, but it gave him bleeding ulcers when Tamala Julian put him on coumadin in 2010 Pt wants to know the dosage he was on when he was prescribed back in 2010 by Tamala Julian

## 2017-10-27 NOTE — Telephone Encounter (Signed)
Patient is calling and states that he is suppose to start chemotherapy in a week and a half and Dr. Julien Nordmann wants him to start coumadin 2 mg QD. Patient is concerned because he had issues with bleeding on anticoagulation in the past. Patient is wanting to know what dose of coumadin he was taking in 2010. Made patient aware that I would forward the information to Dr. Tamala Julian and his nurse for review. Patient verbalized understanding and thanked me for the call.

## 2017-10-28 NOTE — Telephone Encounter (Signed)
I don't know the dose, it can vary. INR needs close follow up.

## 2017-10-30 NOTE — Telephone Encounter (Signed)
Advised pt we are unsure of previous dose but Dr. Tamala Julian recommended keeping a close eye on INR.  Pt verbalized understanding and will talk to Dr. Julien Nordmann about this at next visit.  Pt appreciative for call.

## 2017-10-31 ENCOUNTER — Encounter: Payer: Self-pay | Admitting: *Deleted

## 2017-10-31 ENCOUNTER — Inpatient Hospital Stay: Payer: Medicare Other

## 2017-11-01 ENCOUNTER — Encounter: Payer: Self-pay | Admitting: Internal Medicine

## 2017-11-01 NOTE — Progress Notes (Signed)
Called pt to introduce myself as his Arboriculturist and to discuss copay assistance.  Pt gave me consent to apply in his behalf so I completed the Patient [place] application online and he was approved for $10,000 from 11/01/17 to 11/02/18 with a 6 month look back period.  I also informed him of the [place] and went over what it covers.  He would like to apply so he will bring his proof of income on 11/07/17.  Once approved I will give him an expense sheet and my card to contact me with any questions he may have in the future.

## 2017-11-06 ENCOUNTER — Other Ambulatory Visit: Payer: Self-pay | Admitting: Medical Oncology

## 2017-11-06 DIAGNOSIS — C9000 Multiple myeloma not having achieved remission: Secondary | ICD-10-CM

## 2017-11-07 ENCOUNTER — Inpatient Hospital Stay: Payer: Medicare Other

## 2017-11-07 ENCOUNTER — Encounter (HOSPITAL_COMMUNITY): Payer: Self-pay

## 2017-11-07 ENCOUNTER — Encounter: Payer: Self-pay | Admitting: Internal Medicine

## 2017-11-07 ENCOUNTER — Inpatient Hospital Stay: Payer: Medicare Other | Attending: Internal Medicine

## 2017-11-07 VITALS — BP 151/70 | HR 55 | Temp 98.7°F | Resp 18

## 2017-11-07 DIAGNOSIS — Z7982 Long term (current) use of aspirin: Secondary | ICD-10-CM | POA: Diagnosis not present

## 2017-11-07 DIAGNOSIS — Z9889 Other specified postprocedural states: Secondary | ICD-10-CM | POA: Insufficient documentation

## 2017-11-07 DIAGNOSIS — C9000 Multiple myeloma not having achieved remission: Secondary | ICD-10-CM

## 2017-11-07 DIAGNOSIS — Z5112 Encounter for antineoplastic immunotherapy: Secondary | ICD-10-CM | POA: Insufficient documentation

## 2017-11-07 DIAGNOSIS — Z8546 Personal history of malignant neoplasm of prostate: Secondary | ICD-10-CM | POA: Diagnosis not present

## 2017-11-07 DIAGNOSIS — Z79899 Other long term (current) drug therapy: Secondary | ICD-10-CM | POA: Insufficient documentation

## 2017-11-07 LAB — CBC WITH DIFFERENTIAL (CANCER CENTER ONLY)
Basophils Absolute: 0 10*3/uL (ref 0.0–0.1)
Basophils Relative: 0 %
Eosinophils Absolute: 0.2 10*3/uL (ref 0.0–0.5)
Eosinophils Relative: 3 %
HCT: 44.6 % (ref 38.4–49.9)
Hemoglobin: 15.6 g/dL (ref 13.0–17.1)
Lymphocytes Relative: 30 %
Lymphs Abs: 1.9 10*3/uL (ref 0.9–3.3)
MCH: 32 pg (ref 27.2–33.4)
MCHC: 35 g/dL (ref 32.0–36.0)
MCV: 91.6 fL (ref 79.3–98.0)
Monocytes Absolute: 0.4 10*3/uL (ref 0.1–0.9)
Monocytes Relative: 7 %
Neutro Abs: 3.7 10*3/uL (ref 1.5–6.5)
Neutrophils Relative %: 60 %
Platelet Count: 206 10*3/uL (ref 140–400)
RBC: 4.87 MIL/uL (ref 4.20–5.82)
RDW: 13.7 % (ref 11.0–14.6)
WBC Count: 6.2 10*3/uL (ref 4.0–10.3)

## 2017-11-07 LAB — CMP (CANCER CENTER ONLY)
ALT: 10 U/L (ref 0–55)
AST: 17 U/L (ref 5–34)
Albumin: 4 g/dL (ref 3.5–5.0)
Alkaline Phosphatase: 77 U/L (ref 40–150)
Anion gap: 7 (ref 3–11)
BUN: 28 mg/dL — ABNORMAL HIGH (ref 7–26)
CO2: 29 mmol/L (ref 22–29)
Calcium: 10 mg/dL (ref 8.4–10.4)
Chloride: 103 mmol/L (ref 98–109)
Creatinine: 2.08 mg/dL — ABNORMAL HIGH (ref 0.70–1.30)
GFR, Est AFR Am: 33 mL/min — ABNORMAL LOW (ref >60)
GFR, Estimated: 28 mL/min — ABNORMAL LOW (ref >60)
Glucose, Bld: 107 mg/dL (ref 70–140)
Potassium: 4 mmol/L (ref 3.5–5.1)
Sodium: 139 mmol/L (ref 136–145)
Total Bilirubin: 0.8 mg/dL (ref 0.2–1.2)
Total Protein: 8.1 g/dL (ref 6.4–8.3)

## 2017-11-07 MED ORDER — PROCHLORPERAZINE MALEATE 10 MG PO TABS
ORAL_TABLET | ORAL | Status: AC
  Filled 2017-11-07: qty 1

## 2017-11-07 MED ORDER — PROCHLORPERAZINE MALEATE 10 MG PO TABS
10.0000 mg | ORAL_TABLET | Freq: Once | ORAL | Status: AC
  Administered 2017-11-07: 10 mg via ORAL

## 2017-11-07 MED ORDER — BORTEZOMIB CHEMO SQ INJECTION 3.5 MG (2.5MG/ML)
1.3000 mg/m2 | Freq: Once | INTRAMUSCULAR | Status: AC
  Administered 2017-11-07: 2.5 mg via SUBCUTANEOUS
  Filled 2017-11-07: qty 2.5

## 2017-11-07 NOTE — Progress Notes (Signed)
Per Dr Julien Nordmann, I instructed pt that he may take aspirin 81 mg /day or coumadin. Pt decided to take aspirin.

## 2017-11-07 NOTE — Progress Notes (Signed)
Pt is approved for the $400 [facility] grant.  °

## 2017-11-07 NOTE — Patient Instructions (Addendum)
[facility] Discharge Instructions for Patients Receiving Chemotherapy  Today you received the following chemotherapy agents: Bortezomib (Velcade).  To help prevent nausea and vomiting after your treatment, we encourage you to take your nausea medication as directed   If you develop nausea and vomiting that is not controlled by your nausea medication, call the clinic.   BELOW ARE SYMPTOMS THAT SHOULD BE REPORTED IMMEDIATELY:  *FEVER GREATER THAN 100.5 F  *CHILLS WITH OR WITHOUT FEVER  NAUSEA AND VOMITING THAT IS NOT CONTROLLED WITH YOUR NAUSEA MEDICATION  *UNUSUAL SHORTNESS OF BREATH  *UNUSUAL BRUISING OR BLEEDING  TENDERNESS IN MOUTH AND THROAT WITH OR WITHOUT PRESENCE OF ULCERS  *URINARY PROBLEMS  *BOWEL PROBLEMS  UNUSUAL RASH Items with * indicate a potential emergency and should be followed up as soon as possible.  Feel free to call the clinic should you have any questions or concerns. The clinic phone number is [phone].  Please show the [place] at check-in to the Emergency Department and triage nurse.   Bortezomib (Velcade) injection What is this medicine? BORTEZOMIB (bor TEZ oh mib) is a medicine that targets proteins in cancer cells and stops the cancer cells from growing. It is used to treat multiple myeloma and mantle-cell lymphoma. This medicine may be used for other purposes; ask your health care provider or pharmacist if you have questions. COMMON BRAND NAME(S): Velcade What should I tell my health care provider before I take this medicine? They need to know if you have any of these conditions: -diabetes -heart disease -irregular heartbeat -liver disease -on hemodialysis -low blood counts, like low white blood cells, platelets, or hemoglobin -peripheral neuropathy -taking medicine for blood pressure -an unusual or allergic reaction to bortezomib, mannitol, boron, other medicines, foods, dyes, or preservatives -pregnant or  trying to get pregnant -breast-feeding How should I use this medicine? This medicine is for injection into a vein or for injection under the skin. It is given by a health care professional in a hospital or clinic setting. Talk to your pediatrician regarding the use of this medicine in children. Special care may be needed. Overdosage: If you think you have taken too much of this medicine contact a poison control center or emergency room at once. NOTE: This medicine is only for you. Do not share this medicine with others. What if I miss a dose? It is important not to miss your dose. Call your doctor or health care professional if you are unable to keep an appointment. What may interact with this medicine? This medicine may interact with the following medications: -ketoconazole -rifampin -ritonavir -St. John's Wort This list may not describe all possible interactions. Give your health care provider a list of all the medicines, herbs, non-prescription drugs, or dietary supplements you use. Also tell them if you smoke, drink alcohol, or use illegal drugs. Some items may interact with your medicine. What should I watch for while using this medicine? You may get drowsy or dizzy. Do not drive, use machinery, or do anything that needs mental alertness until you know how this medicine affects you. Do not stand or sit up quickly, especially if you are an older patient. This reduces the risk of dizzy or fainting spells. In some cases, you may be given additional medicines to help with side effects. Follow all directions for their use. Call your doctor or health care professional for advice if you get a fever, chills or sore throat, or other symptoms of a cold or flu.  Do not treat yourself. This drug decreases your body's ability to fight infections. Try to avoid being around people who are sick. This medicine may increase your risk to bruise or bleed. Call your doctor or health care professional if you  notice any unusual bleeding. You may need blood work done while you are taking this medicine. In some patients, this medicine may cause a serious brain infection that may cause death. If you have any problems seeing, thinking, speaking, walking, or standing, tell your doctor right away. If you cannot reach your doctor, urgently seek other source of medical care. Check with your doctor or health care professional if you get an attack of severe diarrhea, nausea and vomiting, or if you sweat a lot. The loss of too much body fluid can make it dangerous for you to take this medicine. Do not become pregnant while taking this medicine or for at least 2 months after stopping it. Women should inform their doctor if they wish to become pregnant or think they might be pregnant. Men should not father a child while taking this medicine and for at least 2 months after stopping it. There is a potential for serious side effects to an unborn child. Talk to your health care professional or pharmacist for more information. Do not breast-feed an infant while taking this medicine or for 2 months after stopping it. This medicine may interfere with the ability to have a child. You should talk with your doctor or health care professional if you are concerned about your fertility. What side effects may I notice from receiving this medicine? Side effects that you should report to your doctor or health care professional as soon as possible: -allergic reactions like skin rash, itching or hives, swelling of the face, lips, or tongue -breathing problems -changes in hearing -changes in vision -fast, irregular heartbeat -feeling faint or lightheaded, falls -pain, tingling, numbness in the hands or feet -right upper belly pain -seizures -swelling of the ankles, feet, hands -unusual bleeding or bruising -unusually weak or tired -vomiting -yellowing of the eyes or skin Side effects that usually do not require medical attention  (report to your doctor or health care professional if they continue or are bothersome): -changes in emotions or moods -constipation -diarrhea -loss of appetite -headache -irritation at site where injected -nausea This list may not describe all possible side effects. Call your doctor for medical advice about side effects. You may report side effects to FDA at 1-800-FDA-1088. Where should I keep my medicine? This drug is given in a hospital or clinic and will not be stored at home. NOTE: This sheet is a summary. It may not cover all possible information. If you have questions about this medicine, talk to your doctor, pharmacist, or health care provider.  2018 Elsevier/Gold Standard (2016-08-18 15:53:51)

## 2017-11-07 NOTE — Progress Notes (Signed)
Per Dr. Julien Nordmann: OK to treat with creatinine of 2.08.

## 2017-11-08 ENCOUNTER — Telehealth: Payer: Self-pay | Admitting: Medical Oncology

## 2017-11-08 LAB — CHROMOSOME ANALYSIS, BONE MARROW

## 2017-11-08 LAB — TISSUE HYBRIDIZATION (BONE MARROW)-NCBH

## 2017-11-08 NOTE — Telephone Encounter (Signed)
Tolerated velcade without any problems.

## 2017-11-13 ENCOUNTER — Other Ambulatory Visit: Payer: Self-pay

## 2017-11-13 DIAGNOSIS — C9000 Multiple myeloma not having achieved remission: Secondary | ICD-10-CM

## 2017-11-14 ENCOUNTER — Inpatient Hospital Stay: Payer: Medicare Other

## 2017-11-14 ENCOUNTER — Ambulatory Visit (HOSPITAL_BASED_OUTPATIENT_CLINIC_OR_DEPARTMENT_OTHER): Payer: Medicare Other | Admitting: Medical

## 2017-11-14 ENCOUNTER — Other Ambulatory Visit: Payer: Self-pay | Admitting: Medical Oncology

## 2017-11-14 VITALS — BP 147/65 | HR 58 | Temp 98.3°F | Resp 18

## 2017-11-14 DIAGNOSIS — R21 Rash and other nonspecific skin eruption: Secondary | ICD-10-CM | POA: Diagnosis not present

## 2017-11-14 DIAGNOSIS — C9000 Multiple myeloma not having achieved remission: Secondary | ICD-10-CM

## 2017-11-14 DIAGNOSIS — Z5112 Encounter for antineoplastic immunotherapy: Secondary | ICD-10-CM | POA: Diagnosis not present

## 2017-11-14 LAB — CMP (CANCER CENTER ONLY)
ALT: 13 U/L (ref 0–55)
AST: 17 U/L (ref 5–34)
Albumin: 3.9 g/dL (ref 3.5–5.0)
Alkaline Phosphatase: 86 U/L (ref 40–150)
Anion gap: 7 (ref 3–11)
BUN: 25 mg/dL (ref 7–26)
CO2: 30 mmol/L — ABNORMAL HIGH (ref 22–29)
Calcium: 9.7 mg/dL (ref 8.4–10.4)
Chloride: 102 mmol/L (ref 98–109)
Creatinine: 2.24 mg/dL — ABNORMAL HIGH (ref 0.70–1.30)
GFR, Est AFR Am: 30 mL/min — ABNORMAL LOW (ref >60)
GFR, Estimated: 26 mL/min — ABNORMAL LOW (ref >60)
Glucose, Bld: 117 mg/dL (ref 70–140)
Potassium: 4.2 mmol/L (ref 3.5–5.1)
Sodium: 139 mmol/L (ref 136–145)
Total Bilirubin: 0.9 mg/dL (ref 0.2–1.2)
Total Protein: 7.7 g/dL (ref 6.4–8.3)

## 2017-11-14 LAB — CBC WITH DIFFERENTIAL (CANCER CENTER ONLY)
Basophils Absolute: 0 10*3/uL (ref 0.0–0.1)
Basophils Relative: 0 %
Eosinophils Absolute: 0.3 10*3/uL (ref 0.0–0.5)
Eosinophils Relative: 4 %
HCT: 43.2 % (ref 38.4–49.9)
Hemoglobin: 15 g/dL (ref 13.0–17.1)
Lymphocytes Relative: 16 %
Lymphs Abs: 1.2 10*3/uL (ref 0.9–3.3)
MCH: 32.1 pg (ref 27.2–33.4)
MCHC: 34.7 g/dL (ref 32.0–36.0)
MCV: 92.3 fL (ref 79.3–98.0)
Monocytes Absolute: 0.3 10*3/uL (ref 0.1–0.9)
Monocytes Relative: 4 %
Neutro Abs: 5.7 10*3/uL (ref 1.5–6.5)
Neutrophils Relative %: 76 %
Platelet Count: 186 10*3/uL (ref 140–400)
RBC: 4.68 MIL/uL (ref 4.20–5.82)
RDW: 14 % (ref 11.0–14.6)
Smear Review: 1
WBC Count: 7.5 10*3/uL (ref 4.0–10.3)

## 2017-11-14 MED ORDER — PROCHLORPERAZINE MALEATE 10 MG PO TABS
10.0000 mg | ORAL_TABLET | Freq: Once | ORAL | Status: AC
  Administered 2017-11-14: 10 mg via ORAL

## 2017-11-14 MED ORDER — BORTEZOMIB CHEMO SQ INJECTION 3.5 MG (2.5MG/ML)
1.3000 mg/m2 | Freq: Once | INTRAMUSCULAR | Status: AC
  Administered 2017-11-14: 2.5 mg via SUBCUTANEOUS
  Filled 2017-11-14: qty 2.5

## 2017-11-14 MED ORDER — PROCHLORPERAZINE MALEATE 10 MG PO TABS
ORAL_TABLET | ORAL | Status: AC
Start: 2017-11-14 — End: 2017-11-14
  Filled 2017-11-14: qty 1

## 2017-11-14 NOTE — Patient Instructions (Signed)
[facility] Discharge Instructions for Patients Receiving Chemotherapy  Today you received the following chemotherapy agents bortezomib (Velcade)  To help prevent nausea and vomiting after your treatment, we encourage you to take your nausea medication as directed.   If you develop nausea and vomiting that is not controlled by your nausea medication, call the clinic.   BELOW ARE SYMPTOMS THAT SHOULD BE REPORTED IMMEDIATELY:  *FEVER GREATER THAN 100.5 F  *CHILLS WITH OR WITHOUT FEVER  NAUSEA AND VOMITING THAT IS NOT CONTROLLED WITH YOUR NAUSEA MEDICATION  *UNUSUAL SHORTNESS OF BREATH  *UNUSUAL BRUISING OR BLEEDING  TENDERNESS IN MOUTH AND THROAT WITH OR WITHOUT PRESENCE OF ULCERS  *URINARY PROBLEMS  *BOWEL PROBLEMS  UNUSUAL RASH Items with * indicate a potential emergency and should be followed up as soon as possible.  Feel free to call the clinic should you have any questions or concerns. The clinic phone number is [phone].  Please show the CHEMO ALERT CARD at check-in to the Emergency Department and triage nurse.   

## 2017-11-14 NOTE — Progress Notes (Signed)
Dr. Julien Nordmann okay to tx with ctn 2.24. Pt noted rash around area from last injection. Erythema with defined borders. Pt denied any "spread" since the redness appeared on Thursday of last week. Pt additionally denied itching. Sandi Mealy, PA chairside to assess site. See note from Sandi Mealy, PA regarding assessment of site.

## 2017-11-15 NOTE — Progress Notes (Signed)
[facility] Progress Note   Thomas [name] [id number] 12/19/1934 83 y.o.  Thomas [name] is managed by Dr. Eilleen Kempf  Actively treated with chemotherapy: yes  Current Therapy: Velcade  Last Treated: 11/14/2017  Assessment: Plan:    Rash   Right abdominal rash: The patient had the Zostavax vaccine several years ago.  I discussed with the patient that I did not believe that this was a reaction to his dose of Velcade last week which was given in the right abdomen.  I explained to him that I believe that this is a muted outbreak of shingles.  Since it began last Thursday and given that there are no vesicles, I explained to him that there was no treatment that could be given at this point.  I asked him to continue monitoring this area and to return as needed. His complete blood count was reviewed today and showed a WBC of 7.5 with an ANC of 5.7.   Please see After Visit Summary for patient specific instructions.  Future Appointments  Date Time Provider [place]  11/21/2017 10:30 AM [facility] LAB 3 [facility] None  11/21/2017 11:30 AM [facility] F19 [facility] None  11/28/2017  9:30 AM [facility] LAB 4 [facility] None  11/28/2017 10:00 AM Thomas Bears, MD [facility] None  11/28/2017 11:00 AM [facility] A1 [facility] None  12/05/2017 10:30 AM [facility] LAB 1 [facility] None  12/05/2017 11:30 AM [facility] E15 [facility] None    No orders of the defined types were placed in this encounter.      Subjective:   Patient ID:  Thomas [name] is a 83 y.o. (DOB [birth year]) male.  Chief Complaint: No chief complaint on file.  HPI Thomas [name] is an 83 year old male with history of multiple myeloma who was treated with cycle 1, day 1 of bortezomib on 11/07/2017 with a subcu injection given in the right abdomen last Tuesday.  The patient developed a pruritic and burning rash in the right anterior abdomen last Thursday.  He denies  any vesicles.  He received the Zostavax vaccine several years ago.  He denies any prodromal symptoms.  He denies fevers, chills, or sweats.  His complete blood count from today showed a WBC of 7.5 with an ANC of 5.7.  He is receiving cycle 1 day 8 of bortezomib with the injection given in his left abdomen today.  I was asked to see him in the infusion room for the rash in his right abdomen.  Medications: I have reviewed the patient's current medications.  Allergies:  Allergies  Allergen Reactions  . Benadryl [Diphenhydramine Hcl] Other (See Comments)    Bad dreams -  Hallucinations.  . Ceftriaxone Sodium Hives  . Contrast Media [Iodinated Diagnostic Agents] Other (See Comments)    Poor kidney function  . Nsaids Other (See Comments)    REACTION: BLEEDING ULCER  . Penicillins Hives  . Levaquin [Levofloxacin]     Past Medical History:  Diagnosis Date  . BPH (benign prostatic hypertrophy)   . Chronic kidney disease    Hx of bilateral renal cysts.  . Glaucoma   . Heart murmur   . History of atrial fibrillation 03/09/09  . Hx of epiglottitis    2011  . Hypercholesterolemia   . Hypertension   . Mild aortic stenosis   . Prostate cancer ([place]) 7/12   T1C Ad Ca  s/p TURP, observation only  . Sleep apnea   . Smoldering multiple myeloma ([place])  Past Surgical History:  Procedure Laterality Date  . ADRENALECTOMY  02/12/11   Left, for Pheochromocytoma  . CARDIOVERSION N/A 12/26/2013   Procedure: CARDIOVERSION;  Surgeon: [name], MD;  Location: [facility];  Service: Cardiovascular;  Laterality: N/A;  . CHOLECYSTECTOMY  02/12/11  . GASTRECTOMY  1984   S/P gastrectomy for ulcer  . KNEE SURGERY  1971   S/P Right knee surgery  . TEE WITHOUT CARDIOVERSION N/A 12/26/2013   Procedure: TRANSESOPHAGEAL ECHOCARDIOGRAM (TEE);  Surgeon: [name], MD;  Location: [facility];  Service: Cardiovascular;  Laterality: N/A;  . TRANSURETHRAL RESECTION OF PROSTATE  7/12    Family History    Problem Relation Age of Onset  . Prostate cancer Father   . Prostate cancer Brother   . Polymyositis Mother   . Liver disease Sister   . Liver disease Sister   . Liver disease Sister   . Melanoma Brother   . Kidney Stones Brother     Social History   Socioeconomic History  . Marital status: Married    Spouse name: Not on file  . Number of children: 3  . Years of education: Not on file  . Highest education level: Not on file  Social Needs  . Financial resource strain: Not on file  . Food insecurity - worry: Not on file  . Food insecurity - inability: Not on file  . Transportation needs - medical: Not on file  . Transportation needs - non-medical: Not on file  Occupational History    Employer: RETIRED  Tobacco Use  . Smoking status: Never Smoker  . Smokeless tobacco: Never Used  Substance and Sexual Activity  . Alcohol use: No  . Drug use: No  . Sexual activity: Not Currently  Other Topics Concern  . Not on file  Social History Narrative  . Not on file    Past Medical History, Surgical history, Social history, and Family history were reviewed and updated as appropriate.   Please see review of systems for further details on the patient's review from today.   Review of Systems:  Review of Systems  Objective:   Physical Exam:  There were no vitals taken for this visit.   Physical Exam  Constitutional: No distress.  Skin: Rash noted. He is not diaphoretic. There is erythema.  The patient was noted to have a diffuse erythematous macular rash over the right anterior abdomen.  There are multiple small areas along the periphery that appeared as though they could have been areas of healing vesicles.  There are no fluid-filled vesicles present at this point.      Lab Review:     Component Value Date/Time   NA 139 11/14/2017 1015   NA 138 08/16/2017 1404   K 4.2 11/14/2017 1015   K 4.5 08/16/2017 1404   CL 102 11/14/2017 1015   CL 109 (H) 03/21/2013 0958    CO2 30 (H) 11/14/2017 1015   CO2 24 08/16/2017 1404   GLUCOSE 117 11/14/2017 1015   GLUCOSE 110 08/16/2017 1404   GLUCOSE 142 (H) 03/21/2013 0958   BUN 25 11/14/2017 1015   BUN 17.3 08/16/2017 1404   CREATININE 2.24 (H) 11/14/2017 1015   CREATININE 1.8 (H) 08/16/2017 1404   CALCIUM 9.7 11/14/2017 1015   CALCIUM 9.9 08/16/2017 1404   PROT 7.7 11/14/2017 1015   PROT 8.5 (H) 08/16/2017 1404   ALBUMIN 3.9 11/14/2017 1015   ALBUMIN 4.1 08/16/2017 1404   AST 17 11/14/2017 1015  AST 22 08/16/2017 1404   ALT 13 11/14/2017 1015   ALT 16 08/16/2017 1404   ALKPHOS 86 11/14/2017 1015   ALKPHOS 86 08/16/2017 1404   BILITOT 0.9 11/14/2017 1015   BILITOT 0.78 08/16/2017 1404   GFRNONAA 26 (L) 11/14/2017 1015   GFRAA 30 (L) 11/14/2017 1015       Component Value Date/Time   WBC 7.5 11/14/2017 1015   WBC 6.9 08/16/2017 1404   WBC 7.9 01/10/2014 0841   RBC 4.68 11/14/2017 1015   HGB 15.6 08/16/2017 1404   HCT 43.2 11/14/2017 1015   HCT 46.3 08/16/2017 1404   PLT 186 11/14/2017 1015   PLT 198 08/16/2017 1404   MCV 92.3 11/14/2017 1015   MCV 94.9 08/16/2017 1404   MCH 32.1 11/14/2017 1015   MCHC 34.7 11/14/2017 1015   RDW 14.0 11/14/2017 1015   RDW 14.4 08/16/2017 1404   LYMPHSABS 1.2 11/14/2017 1015   LYMPHSABS 2.2 08/16/2017 1404   MONOABS 0.3 11/14/2017 1015   MONOABS 0.5 08/16/2017 1404   EOSABS 0.3 11/14/2017 1015   EOSABS 0.1 08/16/2017 1404   BASOSABS 0.0 11/14/2017 1015   BASOSABS 0.0 08/16/2017 1404   -------------------------------  Imaging from last 24 hours (if applicable):  Radiology interpretation: No results found.      This case was discussed with Dr. Julien Nordmann. He expressed agreement with my management of this patient.

## 2017-11-21 ENCOUNTER — Inpatient Hospital Stay: Payer: Medicare Other

## 2017-11-21 VITALS — BP 149/60 | HR 49 | Temp 97.9°F | Resp 18

## 2017-11-21 DIAGNOSIS — C9000 Multiple myeloma not having achieved remission: Secondary | ICD-10-CM

## 2017-11-21 DIAGNOSIS — Z5112 Encounter for antineoplastic immunotherapy: Secondary | ICD-10-CM | POA: Diagnosis not present

## 2017-11-21 LAB — CMP (CANCER CENTER ONLY)
ALT: 18 U/L (ref 0–55)
AST: 15 U/L (ref 5–34)
Albumin: 3.5 g/dL (ref 3.5–5.0)
Alkaline Phosphatase: 84 U/L (ref 40–150)
Anion gap: 6 (ref 3–11)
BUN: 24 mg/dL (ref 7–26)
CO2: 28 mmol/L (ref 22–29)
Calcium: 9.5 mg/dL (ref 8.4–10.4)
Chloride: 102 mmol/L (ref 98–109)
Creatinine: 2.1 mg/dL — ABNORMAL HIGH (ref 0.70–1.30)
GFR, Est AFR Am: 33 mL/min — ABNORMAL LOW (ref >60)
GFR, Estimated: 28 mL/min — ABNORMAL LOW (ref >60)
Glucose, Bld: 100 mg/dL (ref 70–140)
Potassium: 4 mmol/L (ref 3.5–5.1)
Sodium: 136 mmol/L (ref 136–145)
Total Bilirubin: 0.7 mg/dL (ref 0.2–1.2)
Total Protein: 7.1 g/dL (ref 6.4–8.3)

## 2017-11-21 LAB — CBC WITH DIFFERENTIAL (CANCER CENTER ONLY)
Basophils Absolute: 0 10*3/uL (ref 0.0–0.1)
Basophils Relative: 0 %
Eosinophils Absolute: 0.4 10*3/uL (ref 0.0–0.5)
Eosinophils Relative: 5 %
HCT: 40.4 % (ref 38.4–49.9)
Hemoglobin: 13.6 g/dL (ref 13.0–17.1)
Lymphocytes Relative: 14 %
Lymphs Abs: 1.4 10*3/uL (ref 0.9–3.3)
MCH: 31.5 pg (ref 27.2–33.4)
MCHC: 33.7 g/dL (ref 32.0–36.0)
MCV: 93.4 fL (ref 79.3–98.0)
Monocytes Absolute: 1 10*3/uL — ABNORMAL HIGH (ref 0.1–0.9)
Monocytes Relative: 10 %
Neutro Abs: 7.1 10*3/uL — ABNORMAL HIGH (ref 1.5–6.5)
Neutrophils Relative %: 71 %
Platelet Count: 154 10*3/uL (ref 140–400)
RBC: 4.32 MIL/uL (ref 4.20–5.82)
RDW: 14.2 % (ref 11.0–14.6)
Smear Review: 2
WBC Count: 9.9 10*3/uL (ref 4.0–10.3)

## 2017-11-21 MED ORDER — PROCHLORPERAZINE MALEATE 10 MG PO TABS
10.0000 mg | ORAL_TABLET | Freq: Once | ORAL | Status: AC
  Administered 2017-11-21: 10 mg via ORAL

## 2017-11-21 MED ORDER — BORTEZOMIB CHEMO SQ INJECTION 3.5 MG (2.5MG/ML)
1.3000 mg/m2 | Freq: Once | INTRAMUSCULAR | Status: AC
  Administered 2017-11-21: 2.5 mg via SUBCUTANEOUS
  Filled 2017-11-21: qty 2.5

## 2017-11-21 MED ORDER — PROCHLORPERAZINE MALEATE 10 MG PO TABS
ORAL_TABLET | ORAL | Status: AC
  Filled 2017-11-21: qty 1

## 2017-11-21 NOTE — Progress Notes (Signed)
Dr. Julien Nordmann okay to tx with ctn 2.1.  "Red streaks" noted around velcade injection site from last treatment. Sandi Mealy, PA assessed. Okay to tx. Pt encouraged to monitor for any changes. Rash noted at last visit is still observable, but color has diminished.

## 2017-11-21 NOTE — Patient Instructions (Signed)
[facility] Discharge Instructions for Patients Receiving Chemotherapy  Today you received the following chemotherapy agents bortezomib (Velcade)  To help prevent nausea and vomiting after your treatment, we encourage you to take your nausea medication as directed.   If you develop nausea and vomiting that is not controlled by your nausea medication, call the clinic.   BELOW ARE SYMPTOMS THAT SHOULD BE REPORTED IMMEDIATELY:  *FEVER GREATER THAN 100.5 F  *CHILLS WITH OR WITHOUT FEVER  NAUSEA AND VOMITING THAT IS NOT CONTROLLED WITH YOUR NAUSEA MEDICATION  *UNUSUAL SHORTNESS OF BREATH  *UNUSUAL BRUISING OR BLEEDING  TENDERNESS IN MOUTH AND THROAT WITH OR WITHOUT PRESENCE OF ULCERS  *URINARY PROBLEMS  *BOWEL PROBLEMS  UNUSUAL RASH Items with * indicate a potential emergency and should be followed up as soon as possible.  Feel free to call the clinic should you have any questions or concerns. The clinic phone number is [phone].  Please show the CHEMO ALERT CARD at check-in to the Emergency Department and triage nurse.   

## 2017-11-28 ENCOUNTER — Inpatient Hospital Stay: Payer: Medicare Other

## 2017-11-28 ENCOUNTER — Telehealth: Payer: Self-pay

## 2017-11-28 ENCOUNTER — Inpatient Hospital Stay (HOSPITAL_BASED_OUTPATIENT_CLINIC_OR_DEPARTMENT_OTHER): Payer: Medicare Other | Admitting: Internal Medicine

## 2017-11-28 ENCOUNTER — Encounter: Payer: Self-pay | Admitting: Internal Medicine

## 2017-11-28 VITALS — BP 128/52 | HR 53 | Temp 98.0°F | Resp 18 | Ht 66.0 in | Wt 177.0 lb

## 2017-11-28 DIAGNOSIS — Z7982 Long term (current) use of aspirin: Secondary | ICD-10-CM

## 2017-11-28 DIAGNOSIS — Z9889 Other specified postprocedural states: Secondary | ICD-10-CM | POA: Diagnosis not present

## 2017-11-28 DIAGNOSIS — C9000 Multiple myeloma not having achieved remission: Secondary | ICD-10-CM | POA: Diagnosis not present

## 2017-11-28 DIAGNOSIS — Z8546 Personal history of malignant neoplasm of prostate: Secondary | ICD-10-CM

## 2017-11-28 DIAGNOSIS — Z79899 Other long term (current) drug therapy: Secondary | ICD-10-CM | POA: Diagnosis not present

## 2017-11-28 DIAGNOSIS — Z5112 Encounter for antineoplastic immunotherapy: Secondary | ICD-10-CM | POA: Diagnosis not present

## 2017-11-28 LAB — CBC WITH DIFFERENTIAL (CANCER CENTER ONLY)
Basophils Absolute: 0 10*3/uL (ref 0.0–0.1)
Basophils Relative: 0 %
Eosinophils Absolute: 0.4 10*3/uL (ref 0.0–0.5)
Eosinophils Relative: 5 %
HCT: 38.8 % (ref 38.4–49.9)
Hemoglobin: 13 g/dL (ref 13.0–17.1)
Lymphocytes Relative: 21 %
Lymphs Abs: 1.5 10*3/uL (ref 0.9–3.3)
MCH: 31.6 pg (ref 27.2–33.4)
MCHC: 33.5 g/dL (ref 32.0–36.0)
MCV: 94.4 fL (ref 79.3–98.0)
Monocytes Absolute: 0.9 10*3/uL (ref 0.1–0.9)
Monocytes Relative: 13 %
Neutro Abs: 4.4 10*3/uL (ref 1.5–6.5)
Neutrophils Relative %: 61 %
Platelet Count: 99 10*3/uL — ABNORMAL LOW (ref 140–400)
RBC: 4.11 MIL/uL — ABNORMAL LOW (ref 4.20–5.82)
RDW: 14.4 % (ref 11.0–14.6)
Smear Review: 1
WBC Count: 7.2 10*3/uL (ref 4.0–10.3)

## 2017-11-28 LAB — CMP (CANCER CENTER ONLY)
ALT: 19 U/L (ref 0–55)
AST: 13 U/L (ref 5–34)
Albumin: 3.2 g/dL — ABNORMAL LOW (ref 3.5–5.0)
Alkaline Phosphatase: 85 U/L (ref 40–150)
Anion gap: 7 (ref 3–11)
BUN: 27 mg/dL — ABNORMAL HIGH (ref 7–26)
CO2: 28 mmol/L (ref 22–29)
Calcium: 9.4 mg/dL (ref 8.4–10.4)
Chloride: 103 mmol/L (ref 98–109)
Creatinine: 2.03 mg/dL — ABNORMAL HIGH (ref 0.70–1.30)
GFR, Est AFR Am: 34 mL/min — ABNORMAL LOW (ref >60)
GFR, Estimated: 29 mL/min — ABNORMAL LOW (ref >60)
Glucose, Bld: 110 mg/dL (ref 70–140)
Potassium: 4.1 mmol/L (ref 3.5–5.1)
Sodium: 138 mmol/L (ref 136–145)
Total Bilirubin: 1 mg/dL (ref 0.2–1.2)
Total Protein: 6.6 g/dL (ref 6.4–8.3)

## 2017-11-28 MED ORDER — BORTEZOMIB CHEMO SQ INJECTION 3.5 MG (2.5MG/ML)
1.3000 mg/m2 | Freq: Once | INTRAMUSCULAR | Status: AC
  Administered 2017-11-28: 2.5 mg via SUBCUTANEOUS
  Filled 2017-11-28: qty 2.5

## 2017-11-28 MED ORDER — PROCHLORPERAZINE MALEATE 10 MG PO TABS
ORAL_TABLET | ORAL | Status: AC
  Filled 2017-11-28: qty 1

## 2017-11-28 MED ORDER — PROCHLORPERAZINE MALEATE 10 MG PO TABS
10.0000 mg | ORAL_TABLET | Freq: Once | ORAL | Status: AC
  Administered 2017-11-28: 10 mg via ORAL

## 2017-11-28 NOTE — Progress Notes (Signed)
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 2) History of pheochromocytoma LEFT adrenal gland status post resection on 03/16/2009 3) Prostate adenocarcinoma  PRIOR THERAPY: None  CURRENT THERAPY: Systemic therapy with weekly Velcade 1.3 mg/KG subcutaneously, Revlimid 25 mg p.o. daily for 21 days every 4 weeks as well as Decadron 20 mg p.o. weekly.  First dose November 07, 2017.  Status post 1 cycle.  INTERVAL HISTORY: Thomas [name] 83 y.o. male returns to the clinic today for follow-up visit accompanied by his wife.  The patient continues to tolerate his treatment with Velcade, Revlimid and Decadron fairly well.  He is status post 1 cycle.  He denied having any nausea, vomiting, diarrhea or constipation.  He has no fever or chills.  He denied having any chest pain, shortness of breath, cough or hemoptysis.  He is here today for evaluation before starting cycle #2.   MEDICAL HISTORY: Past Medical History:  Diagnosis Date  . BPH (benign prostatic hypertrophy)   . Chronic kidney disease    Hx of bilateral renal cysts.  . Glaucoma   . Heart murmur   . History of atrial fibrillation 03/09/09  . Hx of epiglottitis    2011  . Hypercholesterolemia   . Hypertension   . Mild aortic stenosis   . Prostate cancer ([place]) 7/12   T1C Ad Ca  s/p TURP, observation only  . Sleep apnea   . Smoldering multiple myeloma ([facility])     ALLERGIES:  is allergic to benadryl [diphenhydramine hcl]; ceftriaxone sodium; contrast media [iodinated diagnostic agents]; nsaids; penicillins; and levaquin [levofloxacin].  MEDICATIONS:  Current Outpatient Medications  Medication Sig Dispense Refill  . acetaminophen (TYLENOL) 500 MG tablet Take 1,000 mg by mouth  at bedtime.    Marland Kitchen acyclovir (ZOVIRAX) 200 MG capsule Take 1 capsule (200 mg total) by mouth 2 (two) times daily. 60 capsule 4  . amiodarone (PACERONE) 200 MG tablet TAKE 1/2 TABLETS BY MOUTH DAILY. 45 tablet 3  . amLODipine (NORVASC) 5 MG tablet Take 1 tablet (5 mg total) by mouth daily. 90 tablet 2  . aspirin 81 MG tablet Take 81 mg by mouth daily.    . brimonidine-timolol (COMBIGAN) 0.2-0.5 % ophthalmic solution Place 1 drop into both eyes 2 (two) times daily.     . Cholecalciferol (VITAMIN D3) 1000 UNITS CAPS Take 1 capsule by mouth daily.     Marland Kitchen dexamethasone (DECADRON) 4 MG tablet 5 tablets p.o. every week start with the first dose of Velcade. 80 tablet 4  . furosemide (LASIX) 40 MG tablet Take 1 tablet (40 mg total) by mouth daily. 90 tablet 3  . lenalidomide (REVLIMID) 10 MG capsule Take 1 capsule (10 mg total) by mouth daily. Take for 21 days on, 7 days off, repeat every 28 days 21 capsule 0  . levothyroxine (SYNTHROID, LEVOTHROID) 50 MCG tablet TAKE 1 TABLET BY MOUTH DAILY BEFORE BREAKFAST. 90 tablet 3  . prochlorperazine (COMPAZINE) 10 MG tablet Take 1 tablet (10 mg total) by mouth every 6 (six) hours as needed for nausea or vomiting. 30 tablet 0  . PROLENSA 0.07 % SOLN INSTILL 1 DROP INTO RIGHT EYE AT BEDTIME  1  . TRAVATAN Z 0.004 % SOLN ophthalmic solution Place 1 drop  into both eyes at bedtime.     No current facility-administered medications for this visit.     SURGICAL HISTORY:  Past Surgical History:  Procedure Laterality Date  . ADRENALECTOMY  02/12/11   Left, for Pheochromocytoma  . CARDIOVERSION N/A 12/26/2013   Procedure: CARDIOVERSION;  Surgeon: [name], MD;  Location: [facility];  Service: Cardiovascular;  Laterality: N/A;  . CHOLECYSTECTOMY  02/12/11  . GASTRECTOMY  1984   S/P gastrectomy for ulcer  . KNEE SURGERY  1971   S/P Right knee surgery  . TEE WITHOUT CARDIOVERSION N/A 12/26/2013   Procedure: TRANSESOPHAGEAL ECHOCARDIOGRAM (TEE);  Surgeon: [name],  MD;  Location: [facility];  Service: Cardiovascular;  Laterality: N/A;  . TRANSURETHRAL RESECTION OF PROSTATE  7/12    REVIEW OF SYSTEMS:  A comprehensive review of systems was negative except for: Constitutional: positive for fatigue   PHYSICAL EXAMINATION: General appearance: alert, cooperative and no distress Head: Normocephalic, without obvious abnormality, atraumatic Neck: no adenopathy, no JVD, supple, symmetrical, trachea midline and thyroid not enlarged, symmetric, no tenderness/mass/nodules Lymph nodes: Cervical, supraclavicular, and axillary nodes normal. Resp: clear to auscultation bilaterally Back: symmetric, no curvature. ROM normal. No CVA tenderness. Cardio: regular rate and rhythm, S1, S2 normal, no murmur, click, rub or gallop GI: soft, non-tender; bowel sounds normal; no masses,  no organomegaly Extremities: extremities normal, atraumatic, no cyanosis or edema  ECOG PERFORMANCE STATUS: 1 - Symptomatic but completely ambulatory  Blood pressure (!) 128/52, pulse (!) 53, temperature 98 F (36.7 C), temperature source Oral, resp. rate 18, height 5' 6"  (1.676 m), weight 177 lb (80.3 kg), SpO2 98 %.  LABORATORY DATA: Lab Results  Component Value Date   WBC 7.2 11/28/2017   HGB 15.6 08/16/2017   HCT 38.8 11/28/2017   MCV 94.4 11/28/2017   PLT 99 (L) 11/28/2017      Chemistry      Component Value Date/Time   NA 138 11/28/2017 0851   NA 138 08/16/2017 1404   K 4.1 11/28/2017 0851   K 4.5 08/16/2017 1404   CL 103 11/28/2017 0851   CL 109 (H) 03/21/2013 0958   CO2 28 11/28/2017 0851   CO2 24 08/16/2017 1404   BUN 27 (H) 11/28/2017 0851   BUN 17.3 08/16/2017 1404   CREATININE 2.03 (H) 11/28/2017 0851   CREATININE 1.8 (H) 08/16/2017 1404      Component Value Date/Time   CALCIUM 9.4 11/28/2017 0851   CALCIUM 9.9 08/16/2017 1404   ALKPHOS 85 11/28/2017 0851   ALKPHOS 86 08/16/2017 1404   AST 13 11/28/2017 0851   AST 22 08/16/2017 1404   ALT 19 11/28/2017  0851   ALT 16 08/16/2017 1404   BILITOT 1.0 11/28/2017 0851   BILITOT 0.78 08/16/2017 1404       RADIOGRAPHIC STUDIES: No results found.  ASSESSMENT AND PLAN:  This is a very pleasant 83 years old white male with IgG smoldering [place] diagnosed in April 2011. The patient has been on observation since 2011. His recent myeloma panel showed further increase in the free kappa light chain. His recent skeletal bone survey showed no concerning findings for disease progression.  He has a focus of avascular necrosis but no significant lytic lesions. The recent bone marrow biopsy and aspirate showed further increase in the plasma cells to 15%. The patient is currently undergoing treatment with subcutaneous weekly Velcade as well as Revlimid and Decadron and been tolerating this treatment fairly well. I recommended for him to proceed  with cycle #2 today as a scheduled. He will come back for follow-up visit in 4 weeks for evaluation before starting cycle #3. The patient was advised to call immediately if he has any concerning symptoms in the interval. The patient voices understanding of current disease status and treatment options and is in agreement with the current care plan. All questions were answered. The patient knows to call the clinic with any problems, questions or concerns. We can certainly see the patient much sooner if necessary. I spent 10 minutes counseling the patient face to face. The total time spent in the appointment was 15 minutes.  Disclaimer: This note was dictated with voice recognition software. Similar sounding words can inadvertently be transcribed and may not be corrected upon review.

## 2017-11-28 NOTE — Telephone Encounter (Signed)
Infussion will print avs and calender of upcoming appointment. Per 2/26 los

## 2017-11-28 NOTE — Patient Instructions (Addendum)
[facility] Discharge Instructions for Patients Receiving Chemotherapy  Today you received the following chemotherapy agents: Bortezomib (Velcade)  To help prevent nausea and vomiting after your treatment, we encourage you to take your nausea medication as directed.    If you develop nausea and vomiting that is not controlled by your nausea medication, call the clinic.   BELOW ARE SYMPTOMS THAT SHOULD BE REPORTED IMMEDIATELY:  *FEVER GREATER THAN 100.5 F  *CHILLS WITH OR WITHOUT FEVER  NAUSEA AND VOMITING THAT IS NOT CONTROLLED WITH YOUR NAUSEA MEDICATION  *UNUSUAL SHORTNESS OF BREATH  *UNUSUAL BRUISING OR BLEEDING  TENDERNESS IN MOUTH AND THROAT WITH OR WITHOUT PRESENCE OF ULCERS  *URINARY PROBLEMS  *BOWEL PROBLEMS  UNUSUAL RASH Items with * indicate a potential emergency and should be followed up as soon as possible.  Feel free to call the clinic should you have any questions or concerns. The clinic phone number is [phone].  Please show the CHEMO ALERT CARD at check-in to the Emergency Department and triage nurse.   

## 2017-11-28 NOTE — Progress Notes (Signed)
Per Julien Nordmann it is okay to treat pt today with velcade and creatinine of 2.03 and plts of 99k.

## 2017-11-29 ENCOUNTER — Other Ambulatory Visit: Payer: Self-pay | Admitting: Medical Oncology

## 2017-11-29 DIAGNOSIS — C9000 Multiple myeloma not having achieved remission: Secondary | ICD-10-CM

## 2017-11-29 MED ORDER — LENALIDOMIDE 10 MG PO CAPS: 10.0000 mg | ORAL_CAPSULE | Freq: Every day | ORAL | 0 refills | Status: DC

## 2017-11-29 NOTE — Progress Notes (Signed)
Faxed to [facility]

## 2017-12-05 ENCOUNTER — Inpatient Hospital Stay: Payer: Medicare Other | Attending: Internal Medicine

## 2017-12-05 ENCOUNTER — Inpatient Hospital Stay: Payer: Medicare Other

## 2017-12-05 VITALS — BP 131/59 | HR 52 | Temp 97.7°F | Resp 17

## 2017-12-05 DIAGNOSIS — G4733 Obstructive sleep apnea (adult) (pediatric): Secondary | ICD-10-CM | POA: Insufficient documentation

## 2017-12-05 DIAGNOSIS — I130 Hypertensive heart and chronic kidney disease with heart failure and stage 1 through stage 4 chronic kidney disease, or unspecified chronic kidney disease: Secondary | ICD-10-CM | POA: Diagnosis not present

## 2017-12-05 DIAGNOSIS — Z880 Allergy status to penicillin: Secondary | ICD-10-CM | POA: Diagnosis not present

## 2017-12-05 DIAGNOSIS — I480 Paroxysmal atrial fibrillation: Secondary | ICD-10-CM | POA: Insufficient documentation

## 2017-12-05 DIAGNOSIS — E7800 Pure hypercholesterolemia, unspecified: Secondary | ICD-10-CM | POA: Insufficient documentation

## 2017-12-05 DIAGNOSIS — N189 Chronic kidney disease, unspecified: Secondary | ICD-10-CM | POA: Insufficient documentation

## 2017-12-05 DIAGNOSIS — C9000 Multiple myeloma not having achieved remission: Secondary | ICD-10-CM | POA: Diagnosis not present

## 2017-12-05 DIAGNOSIS — I5022 Chronic systolic (congestive) heart failure: Secondary | ICD-10-CM | POA: Diagnosis not present

## 2017-12-05 DIAGNOSIS — N400 Enlarged prostate without lower urinary tract symptoms: Secondary | ICD-10-CM | POA: Diagnosis not present

## 2017-12-05 DIAGNOSIS — Z886 Allergy status to analgesic agent status: Secondary | ICD-10-CM | POA: Diagnosis not present

## 2017-12-05 LAB — CMP (CANCER CENTER ONLY)
ALT: 18 U/L (ref 0–55)
AST: 16 U/L (ref 5–34)
Albumin: 3.3 g/dL — ABNORMAL LOW (ref 3.5–5.0)
Alkaline Phosphatase: 93 U/L (ref 40–150)
Anion gap: 7 (ref 3–11)
BUN: 29 mg/dL — ABNORMAL HIGH (ref 7–26)
CO2: 24 mmol/L (ref 22–29)
Calcium: 9.6 mg/dL (ref 8.4–10.4)
Chloride: 107 mmol/L (ref 98–109)
Creatinine: 1.91 mg/dL — ABNORMAL HIGH (ref 0.70–1.30)
GFR, Est AFR Am: 37 mL/min — ABNORMAL LOW (ref >60)
GFR, Estimated: 32 mL/min — ABNORMAL LOW (ref >60)
Glucose, Bld: 125 mg/dL (ref 70–140)
Potassium: 4 mmol/L (ref 3.5–5.1)
Sodium: 138 mmol/L (ref 136–145)
Total Bilirubin: 0.8 mg/dL (ref 0.2–1.2)
Total Protein: 6.4 g/dL (ref 6.4–8.3)

## 2017-12-05 LAB — CBC WITH DIFFERENTIAL (CANCER CENTER ONLY)
Basophils Absolute: 0 10*3/uL (ref 0.0–0.1)
Basophils Relative: 0 %
Eosinophils Absolute: 0 10*3/uL (ref 0.0–0.5)
Eosinophils Relative: 1 %
HCT: 38.5 % (ref 38.4–49.9)
Hemoglobin: 13.3 g/dL (ref 13.0–17.1)
Lymphocytes Relative: 19 %
Lymphs Abs: 1.5 10*3/uL (ref 0.9–3.3)
MCH: 32.2 pg (ref 27.2–33.4)
MCHC: 34.5 g/dL (ref 32.0–36.0)
MCV: 93.2 fL (ref 79.3–98.0)
Monocytes Absolute: 1.1 10*3/uL — ABNORMAL HIGH (ref 0.1–0.9)
Monocytes Relative: 14 %
Neutro Abs: 5.4 10*3/uL (ref 1.5–6.5)
Neutrophils Relative %: 66 %
Platelet Count: 143 10*3/uL (ref 140–400)
RBC: 4.13 MIL/uL — ABNORMAL LOW (ref 4.20–5.82)
RDW: 14.8 % — ABNORMAL HIGH (ref 11.0–14.6)
WBC Count: 8.1 10*3/uL (ref 4.0–10.3)

## 2017-12-05 MED ORDER — PROCHLORPERAZINE MALEATE 10 MG PO TABS
ORAL_TABLET | ORAL | Status: AC
  Filled 2017-12-05: qty 1

## 2017-12-05 MED ORDER — BORTEZOMIB CHEMO SQ INJECTION 3.5 MG (2.5MG/ML)
1.3000 mg/m2 | Freq: Once | INTRAMUSCULAR | Status: AC
  Administered 2017-12-05: 2.5 mg via SUBCUTANEOUS
  Filled 2017-12-05: qty 2.5

## 2017-12-05 MED ORDER — PROCHLORPERAZINE MALEATE 10 MG PO TABS
10.0000 mg | ORAL_TABLET | Freq: Once | ORAL | Status: AC
  Administered 2017-12-05: 10 mg via ORAL

## 2017-12-05 NOTE — Patient Instructions (Signed)
[facility] Discharge Instructions for Patients Receiving Chemotherapy  Today you received the following chemotherapy agents: Bortezomib (Velcade)  To help prevent nausea and vomiting after your treatment, we encourage you to take your nausea medication as directed.    If you develop nausea and vomiting that is not controlled by your nausea medication, call the clinic.   BELOW ARE SYMPTOMS THAT SHOULD BE REPORTED IMMEDIATELY:  *FEVER GREATER THAN 100.5 F  *CHILLS WITH OR WITHOUT FEVER  NAUSEA AND VOMITING THAT IS NOT CONTROLLED WITH YOUR NAUSEA MEDICATION  *UNUSUAL SHORTNESS OF BREATH  *UNUSUAL BRUISING OR BLEEDING  TENDERNESS IN MOUTH AND THROAT WITH OR WITHOUT PRESENCE OF ULCERS  *URINARY PROBLEMS  *BOWEL PROBLEMS  UNUSUAL RASH Items with * indicate a potential emergency and should be followed up as soon as possible.  Feel free to call the clinic should you have any questions or concerns. The clinic phone number is [phone].  Please show the CHEMO ALERT CARD at check-in to the Emergency Department and triage nurse.   

## 2017-12-05 NOTE — Progress Notes (Signed)
Per Dr. Julien Nordmann ok to treat with creatine of 1.91

## 2017-12-06 MED FILL — ACYCLOVIR 200 MG CAP: 200 | 30 days supply | Qty: 60 | Fill #0

## 2017-12-12 ENCOUNTER — Inpatient Hospital Stay: Payer: Medicare Other

## 2017-12-12 VITALS — BP 104/51 | HR 49 | Temp 97.7°F | Resp 16 | Wt 177.5 lb

## 2017-12-12 DIAGNOSIS — C9000 Multiple myeloma not having achieved remission: Secondary | ICD-10-CM

## 2017-12-12 LAB — CMP (CANCER CENTER ONLY)
ALT: 22 U/L (ref 0–55)
AST: 19 U/L (ref 5–34)
Albumin: 3.3 g/dL — ABNORMAL LOW (ref 3.5–5.0)
Alkaline Phosphatase: 92 U/L (ref 40–150)
Anion gap: 5 (ref 3–11)
BUN: 26 mg/dL (ref 7–26)
CO2: 29 mmol/L (ref 22–29)
Calcium: 9.7 mg/dL (ref 8.4–10.4)
Chloride: 106 mmol/L (ref 98–109)
Creatinine: 2.03 mg/dL — ABNORMAL HIGH (ref 0.70–1.30)
GFR, Est AFR Am: 34 mL/min — ABNORMAL LOW (ref >60)
GFR, Estimated: 29 mL/min — ABNORMAL LOW (ref >60)
Glucose, Bld: 84 mg/dL (ref 70–140)
Potassium: 4.2 mmol/L (ref 3.5–5.1)
Sodium: 140 mmol/L (ref 136–145)
Total Bilirubin: 1 mg/dL (ref 0.2–1.2)
Total Protein: 6.4 g/dL (ref 6.4–8.3)

## 2017-12-12 LAB — CBC WITH DIFFERENTIAL (CANCER CENTER ONLY)
Basophils Absolute: 0 10*3/uL (ref 0.0–0.1)
Basophils Relative: 0 %
Eosinophils Absolute: 0.2 10*3/uL (ref 0.0–0.5)
Eosinophils Relative: 3 %
HCT: 37.7 % — ABNORMAL LOW (ref 38.4–49.9)
Hemoglobin: 12.6 g/dL — ABNORMAL LOW (ref 13.0–17.1)
Lymphocytes Relative: 15 %
Lymphs Abs: 1.2 10*3/uL (ref 0.9–3.3)
MCH: 31.7 pg (ref 27.2–33.4)
MCHC: 33.5 g/dL (ref 32.0–36.0)
MCV: 94.7 fL (ref 79.3–98.0)
Monocytes Absolute: 0.5 10*3/uL (ref 0.1–0.9)
Monocytes Relative: 7 %
Neutro Abs: 5.8 10*3/uL (ref 1.5–6.5)
Neutrophils Relative %: 75 %
Platelet Count: 109 10*3/uL — ABNORMAL LOW (ref 140–400)
RBC: 3.98 MIL/uL — ABNORMAL LOW (ref 4.20–5.82)
RDW: 15.6 % — ABNORMAL HIGH (ref 11.0–14.6)
WBC Count: 7.8 10*3/uL (ref 4.0–10.3)
nRBC: 2 /100 WBC — ABNORMAL HIGH

## 2017-12-12 MED ORDER — BORTEZOMIB CHEMO SQ INJECTION 3.5 MG (2.5MG/ML)
1.3000 mg/m2 | Freq: Once | INTRAMUSCULAR | Status: AC
  Administered 2017-12-12: 2.5 mg via SUBCUTANEOUS
  Filled 2017-12-12: qty 2.5

## 2017-12-12 MED ORDER — PROCHLORPERAZINE MALEATE 10 MG PO TABS
10.0000 mg | ORAL_TABLET | Freq: Once | ORAL | Status: AC
  Administered 2017-12-12: 10 mg via ORAL

## 2017-12-12 MED ORDER — PROCHLORPERAZINE MALEATE 10 MG PO TABS
ORAL_TABLET | ORAL | Status: AC
  Filled 2017-12-12: qty 1

## 2017-12-12 NOTE — Patient Instructions (Signed)
[facility] Discharge Instructions for Patients Receiving Chemotherapy  Today you received the following chemotherapy agents Velcade.  To help prevent nausea and vomiting after your treatment, we encourage you to take your nausea medication as directed.  If you develop nausea and vomiting that is not controlled by your nausea medication, call the clinic.   BELOW ARE SYMPTOMS THAT SHOULD BE REPORTED IMMEDIATELY:  *FEVER GREATER THAN 100.5 F  *CHILLS WITH OR WITHOUT FEVER  NAUSEA AND VOMITING THAT IS NOT CONTROLLED WITH YOUR NAUSEA MEDICATION  *UNUSUAL SHORTNESS OF BREATH  *UNUSUAL BRUISING OR BLEEDING  TENDERNESS IN MOUTH AND THROAT WITH OR WITHOUT PRESENCE OF ULCERS  *URINARY PROBLEMS  *BOWEL PROBLEMS  UNUSUAL RASH Items with * indicate a potential emergency and should be followed up as soon as possible.  Feel free to call the clinic should you have any questions or concerns. The clinic phone number is [phone].  Please show the CHEMO ALERT CARD at check-in to the Emergency Department and triage nurse.   

## 2017-12-12 NOTE — Progress Notes (Signed)
Ok to proceed with treatment with 12/12/17 lab results per Dr. Julien Nordmann.

## 2017-12-14 ENCOUNTER — Other Ambulatory Visit: Payer: Self-pay | Admitting: Medical Oncology

## 2017-12-14 DIAGNOSIS — C9000 Multiple myeloma not having achieved remission: Secondary | ICD-10-CM

## 2017-12-14 MED ORDER — LENALIDOMIDE 10 MG PO CAPS: 10.0000 mg | ORAL_CAPSULE | Freq: Every day | ORAL | 0 refills | Status: DC

## 2017-12-19 ENCOUNTER — Inpatient Hospital Stay: Payer: Medicare Other

## 2017-12-19 VITALS — BP 131/64 | HR 50 | Temp 97.8°F | Resp 18

## 2017-12-19 DIAGNOSIS — C9000 Multiple myeloma not having achieved remission: Secondary | ICD-10-CM

## 2017-12-19 LAB — CMP (CANCER CENTER ONLY)
ALT: 19 U/L (ref 0–55)
AST: 16 U/L (ref 5–34)
Albumin: 3.3 g/dL — ABNORMAL LOW (ref 3.5–5.0)
Alkaline Phosphatase: 90 U/L (ref 40–150)
Anion gap: 7 (ref 3–11)
BUN: 30 mg/dL — ABNORMAL HIGH (ref 7–26)
CO2: 26 mmol/L (ref 22–29)
Calcium: 9.3 mg/dL (ref 8.4–10.4)
Chloride: 107 mmol/L (ref 98–109)
Creatinine: 2.22 mg/dL — ABNORMAL HIGH (ref 0.70–1.30)
GFR, Est AFR Am: 30 mL/min — ABNORMAL LOW (ref >60)
GFR, Estimated: 26 mL/min — ABNORMAL LOW (ref >60)
Glucose, Bld: 84 mg/dL (ref 70–140)
Potassium: 4.1 mmol/L (ref 3.5–5.1)
Sodium: 140 mmol/L (ref 136–145)
Total Bilirubin: 0.7 mg/dL (ref 0.2–1.2)
Total Protein: 5.9 g/dL — ABNORMAL LOW (ref 6.4–8.3)

## 2017-12-19 LAB — CBC WITH DIFFERENTIAL (CANCER CENTER ONLY)
Basophils Absolute: 0.1 10*3/uL (ref 0.0–0.1)
Basophils Relative: 1 %
Eosinophils Absolute: 0.3 10*3/uL (ref 0.0–0.5)
Eosinophils Relative: 4 %
HCT: 37.3 % — ABNORMAL LOW (ref 38.4–49.9)
Hemoglobin: 12.5 g/dL — ABNORMAL LOW (ref 13.0–17.1)
Lymphocytes Relative: 16 %
Lymphs Abs: 1.2 10*3/uL (ref 0.9–3.3)
MCH: 32 pg (ref 27.2–33.4)
MCHC: 33.5 g/dL (ref 32.0–36.0)
MCV: 95.3 fL (ref 79.3–98.0)
Monocytes Absolute: 1.1 10*3/uL — ABNORMAL HIGH (ref 0.1–0.9)
Monocytes Relative: 15 %
Neutro Abs: 4.7 10*3/uL (ref 1.5–6.5)
Neutrophils Relative %: 64 %
Platelet Count: 95 10*3/uL — ABNORMAL LOW (ref 140–400)
RBC: 3.92 MIL/uL — ABNORMAL LOW (ref 4.20–5.82)
RDW: 16.1 % — ABNORMAL HIGH (ref 11.0–14.6)
Smear Review: 1
WBC Count: 7.3 10*3/uL (ref 4.0–10.3)

## 2017-12-19 MED ORDER — BORTEZOMIB CHEMO SQ INJECTION 3.5 MG (2.5MG/ML)
1.3000 mg/m2 | Freq: Once | INTRAMUSCULAR | Status: AC
  Administered 2017-12-19: 2.5 mg via SUBCUTANEOUS
  Filled 2017-12-19: qty 2.5

## 2017-12-19 MED ORDER — PROCHLORPERAZINE MALEATE 10 MG PO TABS
10.0000 mg | ORAL_TABLET | Freq: Once | ORAL | Status: AC
  Administered 2017-12-19: 10 mg via ORAL

## 2017-12-19 MED ORDER — PROCHLORPERAZINE MALEATE 10 MG PO TABS
ORAL_TABLET | ORAL | Status: AC
  Filled 2017-12-19: qty 1

## 2017-12-19 NOTE — Patient Instructions (Signed)
[facility] Discharge Instructions for Patients Receiving Chemotherapy  Today you received the following chemotherapy agents Velcade.  To help prevent nausea and vomiting after your treatment, we encourage you to take your nausea medication as directed.  If you develop nausea and vomiting that is not controlled by your nausea medication, call the clinic.   BELOW ARE SYMPTOMS THAT SHOULD BE REPORTED IMMEDIATELY:  *FEVER GREATER THAN 100.5 F  *CHILLS WITH OR WITHOUT FEVER  NAUSEA AND VOMITING THAT IS NOT CONTROLLED WITH YOUR NAUSEA MEDICATION  *UNUSUAL SHORTNESS OF BREATH  *UNUSUAL BRUISING OR BLEEDING  TENDERNESS IN MOUTH AND THROAT WITH OR WITHOUT PRESENCE OF ULCERS  *URINARY PROBLEMS  *BOWEL PROBLEMS  UNUSUAL RASH Items with * indicate a potential emergency and should be followed up as soon as possible.  Feel free to call the clinic should you have any questions or concerns. The clinic phone number is [phone].  Please show the CHEMO ALERT CARD at check-in to the Emergency Department and triage nurse.   

## 2017-12-19 NOTE — Progress Notes (Signed)
Per. Dr. Julien Nordmann, okay to treat pt with crt of 2.22 and plt of 95

## 2017-12-26 ENCOUNTER — Inpatient Hospital Stay: Payer: Medicare Other

## 2017-12-26 ENCOUNTER — Other Ambulatory Visit: Payer: Self-pay | Admitting: Medical Oncology

## 2017-12-26 ENCOUNTER — Telehealth: Payer: Self-pay | Admitting: Adult Health

## 2017-12-26 ENCOUNTER — Encounter: Payer: Self-pay | Admitting: Adult Health

## 2017-12-26 ENCOUNTER — Telehealth: Payer: Self-pay | Admitting: Medical Oncology

## 2017-12-26 ENCOUNTER — Inpatient Hospital Stay (HOSPITAL_BASED_OUTPATIENT_CLINIC_OR_DEPARTMENT_OTHER): Payer: Medicare Other | Admitting: Adult Health

## 2017-12-26 VITALS — BP 119/58 | HR 57 | Temp 97.7°F | Resp 16 | Ht 66.0 in | Wt 175.5 lb

## 2017-12-26 DIAGNOSIS — N189 Chronic kidney disease, unspecified: Secondary | ICD-10-CM

## 2017-12-26 DIAGNOSIS — I5022 Chronic systolic (congestive) heart failure: Secondary | ICD-10-CM

## 2017-12-26 DIAGNOSIS — I480 Paroxysmal atrial fibrillation: Secondary | ICD-10-CM | POA: Diagnosis not present

## 2017-12-26 DIAGNOSIS — E7800 Pure hypercholesterolemia, unspecified: Secondary | ICD-10-CM

## 2017-12-26 DIAGNOSIS — G4733 Obstructive sleep apnea (adult) (pediatric): Secondary | ICD-10-CM | POA: Diagnosis not present

## 2017-12-26 DIAGNOSIS — Z886 Allergy status to analgesic agent status: Secondary | ICD-10-CM

## 2017-12-26 DIAGNOSIS — I130 Hypertensive heart and chronic kidney disease with heart failure and stage 1 through stage 4 chronic kidney disease, or unspecified chronic kidney disease: Secondary | ICD-10-CM | POA: Diagnosis not present

## 2017-12-26 DIAGNOSIS — C9000 Multiple myeloma not having achieved remission: Secondary | ICD-10-CM

## 2017-12-26 DIAGNOSIS — Z880 Allergy status to penicillin: Secondary | ICD-10-CM

## 2017-12-26 DIAGNOSIS — N400 Enlarged prostate without lower urinary tract symptoms: Secondary | ICD-10-CM

## 2017-12-26 LAB — CMP (CANCER CENTER ONLY)
ALT: 22 U/L (ref 0–55)
AST: 17 U/L (ref 5–34)
Albumin: 3.4 g/dL — ABNORMAL LOW (ref 3.5–5.0)
Alkaline Phosphatase: 88 U/L (ref 40–150)
Anion gap: 7 (ref 3–11)
BUN: 30 mg/dL — ABNORMAL HIGH (ref 7–26)
CO2: 27 mmol/L (ref 22–29)
Calcium: 9.6 mg/dL (ref 8.4–10.4)
Chloride: 108 mmol/L (ref 98–109)
Creatinine: 1.96 mg/dL — ABNORMAL HIGH (ref 0.70–1.30)
GFR, Est AFR Am: 35 mL/min — ABNORMAL LOW (ref >60)
GFR, Estimated: 31 mL/min — ABNORMAL LOW (ref >60)
Glucose, Bld: 100 mg/dL (ref 70–140)
Potassium: 4.1 mmol/L (ref 3.5–5.1)
Sodium: 142 mmol/L (ref 136–145)
Total Bilirubin: 0.9 mg/dL (ref 0.2–1.2)
Total Protein: 6 g/dL — ABNORMAL LOW (ref 6.4–8.3)

## 2017-12-26 LAB — CBC WITH DIFFERENTIAL (CANCER CENTER ONLY)
Basophils Absolute: 0 10*3/uL (ref 0.0–0.1)
Basophils Relative: 0 %
Eosinophils Absolute: 0.5 10*3/uL (ref 0.0–0.5)
Eosinophils Relative: 9 %
HCT: 37.6 % — ABNORMAL LOW (ref 38.4–49.9)
Hemoglobin: 12.6 g/dL — ABNORMAL LOW (ref 13.0–17.1)
Lymphocytes Relative: 19 %
Lymphs Abs: 1.1 10*3/uL (ref 0.9–3.3)
MCH: 32.3 pg (ref 27.2–33.4)
MCHC: 33.6 g/dL (ref 32.0–36.0)
MCV: 95.9 fL (ref 79.3–98.0)
Monocytes Absolute: 0.9 10*3/uL (ref 0.1–0.9)
Monocytes Relative: 17 %
Neutro Abs: 3 10*3/uL (ref 1.5–6.5)
Neutrophils Relative %: 55 %
Platelet Count: 86 10*3/uL — ABNORMAL LOW (ref 140–400)
RBC: 3.92 MIL/uL — ABNORMAL LOW (ref 4.20–5.82)
RDW: 16.8 % — ABNORMAL HIGH (ref 11.0–14.6)
Smear Review: 1
WBC Count: 5.5 10*3/uL (ref 4.0–10.3)

## 2017-12-26 MED ORDER — PROCHLORPERAZINE MALEATE 10 MG PO TABS
ORAL_TABLET | ORAL | Status: AC
  Filled 2017-12-26: qty 1

## 2017-12-26 MED ORDER — BORTEZOMIB CHEMO SQ INJECTION 3.5 MG (2.5MG/ML)
1.3000 mg/m2 | Freq: Once | INTRAMUSCULAR | Status: AC
  Administered 2017-12-26: 2.5 mg via SUBCUTANEOUS
  Filled 2017-12-26: qty 2.5

## 2017-12-26 MED ORDER — PROCHLORPERAZINE MALEATE 10 MG PO TABS
10.0000 mg | ORAL_TABLET | Freq: Once | ORAL | Status: AC
  Administered 2017-12-26: 10 mg via ORAL

## 2017-12-26 MED ORDER — ACYCLOVIR 200 MG PO CAPS: 200.0000 mg | ORAL_CAPSULE | Freq: Two times a day (BID) | ORAL | 4 refills | Status: DC

## 2017-12-26 NOTE — Patient Instructions (Signed)
[facility] Discharge Instructions for Patients Receiving Chemotherapy  Today you received the following chemotherapy agents: Bortezomib (Velcade)  To help prevent nausea and vomiting after your treatment, we encourage you to take your nausea medication as directed.    If you develop nausea and vomiting that is not controlled by your nausea medication, call the clinic.   BELOW ARE SYMPTOMS THAT SHOULD BE REPORTED IMMEDIATELY:  *FEVER GREATER THAN 100.5 F  *CHILLS WITH OR WITHOUT FEVER  NAUSEA AND VOMITING THAT IS NOT CONTROLLED WITH YOUR NAUSEA MEDICATION  *UNUSUAL SHORTNESS OF BREATH  *UNUSUAL BRUISING OR BLEEDING  TENDERNESS IN MOUTH AND THROAT WITH OR WITHOUT PRESENCE OF ULCERS  *URINARY PROBLEMS  *BOWEL PROBLEMS  UNUSUAL RASH Items with * indicate a potential emergency and should be followed up as soon as possible.  Feel free to call the clinic should you have any questions or concerns. The clinic phone number is [phone].  Please show the CHEMO ALERT CARD at check-in to the Emergency Department and triage nurse.   

## 2017-12-26 NOTE — Telephone Encounter (Signed)
Pt revlimid is ready at pharmacy-pt notified.

## 2017-12-26 NOTE — Assessment & Plan Note (Signed)
Thomas [name] is an 83 year old man with recently progressed multiple myeloma currently on treatment with Revlimid, Velcade, and Dexamethasone.  Thomas [name] is tolerating treatment well and will continue to receive his treatment today.  I refilled his Acyclovir today and requested Diane RN (Dr. Worthy Flank desk nurse) get his Revlimid refilled.  Thomas [name] will continue on with his treatment, and will return here for weekly labs and Velcade.  He will have a repeat myeloma panel on 4/16 and f/u with Dr. Julien Nordmann on 4/23.  This was reviewed with Dr. Julien Nordmann in detail.  The patient is in agreement.

## 2017-12-26 NOTE — Telephone Encounter (Signed)
Gave patient AVs and calendar of upcoming April appointments.  °

## 2017-12-26 NOTE — Progress Notes (Signed)
[facility] Follow up:    Thomas Stains, MD [address] [place] [address]   DIAGNOSIS: Cancer Staging No matching staging information was found for the patient.  SUMMARY OF ONCOLOGIC HISTORY:   Multiple myeloma not having achieved remission (Thomas [name])   01/21/2010 Initial Diagnosis    Smoldering Multiple myeloma, IgG Kappa. (normal calcium, hemoglobin, negative skeletal survey, creatinine less than 2).       01/21/2010 Bone Marrow Biopsy    11 % plasma cells.       02/18/2010 Procedure    Left kidney biopsy showed moderate to severe arterial nephrosclerosis with moderate to severe tubulointerstitial scarring. Immunofluorescence microsocopy showed monoclonal kappa chain stain along the tubular BM.      11/02/2012 Imaging    Metastatic survey showed no acute changes from prior.      11/02/2012 Tumor Marker    Serum kappa light chains was 60.8.  M protein on serum protein electrophoresis was 1.62 g/dL.       11/09/2012 Tumor Marker    Beta 2 microglobulin was 2.58      03/21/2013 Tumor Marker    IgG level was 2020, IgA level 51, IgM 16.      11/18/2013 Tumor Marker    IgG 1900; Kappa light chains 62.5; M-spike, 1.45 g/dL.  Creatinine 1.8.      11/25/2013 Imaging    Metastatic skeletal survey negative for lytic lesions. Faint areas of radiolucency in the frontal bones of the skull and mid-shaft of the left humerus felt to be stable.       07/24/2014 Tumor Marker    IgG 1920, Kappa light chain 86, Kappa lamda ratio 54; LDH 206, Creatinine 1.9      10/13/2017 Progression    Increase in kappa light chain.  Skeletal survey was done and negative.  Bone Marrow biopsy revealed increase in plasma cells to 15%.       11/07/2017 -  Chemotherapy    Systemic therapy with weekly Velcade 1.3 mg/KG subcutaneously, Revlimid 25 mg p.o. daily for 21 days every 4 weeks as well as Decadron 20 mg p.o. Weekly. (RVD)       CURRENT THERAPY: RVD (see  above)  INTERVAL HISTORY: Thomas [name] 83 y.o. male returns for evaluation of his recently progressed multiple myeloma.  He is undergoing treatment with Revlimid, daily Velcade (21 days on, 7 days off), and weekly Dexamethasone.  He is tolerating treatment well.  He denies skin rash, peripheral neuropathy, swelling of any extremities, or any other concerns.  He does have some difficulty sleeping, however noted this prior to starting treatment as well.  He denies any new pain.   Patient Active Problem List   Diagnosis Date Noted  . Sinus node dysfunction ([place]) 08/14/2014  . OSA on CPAP 06/26/2014  . Chronic diastolic heart failure ([place]) 12/27/2013  . Melena 11/07/2013  . Paroxysmal atrial fibrillation ([place]) 11/07/2013  . On amiodarone therapy 11/07/2013  . Hypercholesterolemia   . Chronic kidney disease   . Mild aortic stenosis   . BPH (benign prostatic hypertrophy)   . Multiple myeloma not having achieved remission ([place]) 09/06/2011  . Renal insufficiency 09/06/2011  . Hypertension 09/06/2011    is allergic to benadryl [diphenhydramine hcl]; ceftriaxone sodium; contrast media [iodinated diagnostic agents]; nsaids; penicillins; levaquin [levofloxacin]; and chlorhexidine.  MEDICAL HISTORY: Past Medical History:  Diagnosis Date  . BPH (benign prostatic hypertrophy)   . Chronic kidney disease    Hx of  bilateral renal cysts.  . Glaucoma   . Heart murmur   . History of atrial fibrillation 03/09/09  . Hx of epiglottitis    2011  . Hypercholesterolemia   . Hypertension   . Mild aortic stenosis   . Prostate cancer ([place]) 7/12   T1C Ad Ca  s/p TURP, observation only  . Sleep apnea   . Smoldering multiple myeloma ([place])     SURGICAL HISTORY: Past Surgical History:  Procedure Laterality Date  . ADRENALECTOMY  02/12/11   Left, for Pheochromocytoma  . CARDIOVERSION N/A 12/26/2013   Procedure: CARDIOVERSION;  Surgeon: [name], MD;  Location: [facility];  Service:  Cardiovascular;  Laterality: N/A;  . CHOLECYSTECTOMY  02/12/11  . GASTRECTOMY  1984   S/P gastrectomy for ulcer  . KNEE SURGERY  1971   S/P Right knee surgery  . TEE WITHOUT CARDIOVERSION N/A 12/26/2013   Procedure: TRANSESOPHAGEAL ECHOCARDIOGRAM (TEE);  Surgeon: [name], MD;  Location[facility] ENDOSCOPY;  Service: Cardiovascular;  Laterality: N/A;  . TRANSURETHRAL RESECTION OF PROSTATE  7/12    SOCIAL HISTORY: Social History   Socioeconomic History  . Marital status: Married    Spouse name: Not on file  . Number of children: 3  . Years of education: Not on file  . Highest education level: Not on file  Occupational History    Employer: RETIRED  Social Needs  . Financial resource strain: Not on file  . Food insecurity:    Worry: Not on file    Inability: Not on file  . Transportation needs:    Medical: Not on file    Non-medical: Not on file  Tobacco Use  . Smoking status: Never Smoker  . Smokeless tobacco: Never Used  Substance and Sexual Activity  . Alcohol use: No  . Drug use: No  . Sexual activity: Not Currently  Lifestyle  . Physical activity:    Days per week: Not on file    Minutes per session: Not on file  . Stress: Not on file  Relationships  . Social connections:    Talks on phone: Not on file    Gets together: Not on file    Attends religious service: Not on file    Active member of club or organization: Not on file    Attends meetings of clubs or organizations: Not on file    Relationship status: Not on file  . Intimate partner violence:    Fear of current or ex partner: Not on file    Emotionally abused: Not on file    Physically abused: Not on file    Forced sexual activity: Not on file  Other Topics Concern  . Not on file  Social History Narrative  . Not on file    FAMILY HISTORY: Family History  Problem Relation Age of Onset  . Prostate cancer Father   . Prostate cancer Brother   . Polymyositis Mother   . Liver disease Sister   . Liver  disease Sister   . Liver disease Sister   . Melanoma Brother   . Kidney Stones Brother     Review of Systems  Constitutional: Negative for appetite change, chills, fatigue, fever and unexpected weight change.  HENT:   Negative for hearing loss, lump/mass, mouth sores and trouble swallowing.   Eyes: Negative for eye problems and icterus.  Respiratory: Negative for chest tightness, cough and shortness of breath.   Cardiovascular: Negative for chest pain, leg swelling and palpitations.  Gastrointestinal:  Negative for abdominal distention, abdominal pain, constipation, diarrhea, nausea and vomiting.       Notes that he can have 3 bowel movements one day, then can go 2 days without a bowel movement.  Endocrine: Negative for hot flashes.  Genitourinary: Negative for difficulty urinating and nocturia.   Musculoskeletal: Negative for arthralgias.  Skin: Negative for itching and rash.  Neurological: Negative for dizziness, extremity weakness, headaches and numbness.  Hematological: Negative for adenopathy. Does not bruise/bleed easily.  Psychiatric/Behavioral: Positive for sleep disturbance (as per interval history). Negative for depression. The patient is not nervous/anxious.       PHYSICAL EXAMINATION  ECOG PERFORMANCE STATUS: 1 - Symptomatic but completely ambulatory  Vitals:   12/26/17 0854  BP: (!) 119/58  Pulse: (!) 57  Resp: 16  Temp: 97.7 F (36.5 C)  SpO2: 99%    Physical Exam  Constitutional: He is oriented to person, place, and time and well-developed, well-nourished, and in no distress.  HENT:  Head: Normocephalic and atraumatic.  Mouth/Throat: Oropharynx is clear and moist. No oropharyngeal exudate.  Eyes: Pupils are equal, round, and reactive to light. No scleral icterus.  Neck: Neck supple.  Cardiovascular: Normal rate and regular rhythm.  Murmur heard. Pulmonary/Chest: Effort normal and breath sounds normal. No respiratory distress. He has no wheezes. He has no  rales.  Abdominal: Soft. Bowel sounds are normal. He exhibits no distension and no mass. There is no tenderness. There is no rebound and no guarding.  Musculoskeletal: He exhibits no edema.  Lymphadenopathy:    He has no cervical adenopathy.  Neurological: He is alert and oriented to person, place, and time.  Skin: Skin is warm and dry. No rash noted.  Psychiatric: Mood and affect normal.    LABORATORY DATA:  CBC    Component Value Date/Time   WBC 5.5 12/26/2017 0829   WBC 6.9 08/16/2017 1404   WBC 7.9 01/10/2014 0841   RBC 3.92 (L) 12/26/2017 0829   HGB 15.6 08/16/2017 1404   HCT 37.6 (L) 12/26/2017 0829   HCT 46.3 08/16/2017 1404   PLT 86 (L) 12/26/2017 0829   PLT 198 08/16/2017 1404   MCV 95.9 12/26/2017 0829   MCV 94.9 08/16/2017 1404   MCH 32.3 12/26/2017 0829   MCHC 33.6 12/26/2017 0829   RDW 16.8 (H) 12/26/2017 0829   RDW 14.4 08/16/2017 1404   LYMPHSABS 1.1 12/26/2017 0829   LYMPHSABS 2.2 08/16/2017 1404   MONOABS 0.9 12/26/2017 0829   MONOABS 0.5 08/16/2017 1404   EOSABS 0.5 12/26/2017 0829   EOSABS 0.1 08/16/2017 1404   BASOSABS 0.0 12/26/2017 0829   BASOSABS 0.0 08/16/2017 1404    CMP     Component Value Date/Time   NA 142 12/26/2017 0829   NA 138 08/16/2017 1404   K 4.1 12/26/2017 0829   K 4.5 08/16/2017 1404   CL 108 12/26/2017 0829   CL 109 (H) 03/21/2013 0958   CO2 27 12/26/2017 0829   CO2 24 08/16/2017 1404   GLUCOSE 100 12/26/2017 0829   GLUCOSE 110 08/16/2017 1404   GLUCOSE 142 (H) 03/21/2013 0958   BUN 30 (H) 12/26/2017 0829   BUN 17.3 08/16/2017 1404   CREATININE 1.96 (H) 12/26/2017 0829   CREATININE 1.8 (H) 08/16/2017 1404   CALCIUM 9.6 12/26/2017 0829   CALCIUM 9.9 08/16/2017 1404   PROT 6.0 (L) 12/26/2017 0829   PROT 8.5 (H) 08/16/2017 1404   ALBUMIN 3.4 (L) 12/26/2017 0829   ALBUMIN 4.1 08/16/2017 1404  AST 17 12/26/2017 0829   AST 22 08/16/2017 1404   ALT 22 12/26/2017 0829   ALT 16 08/16/2017 1404   ALKPHOS 88 12/26/2017  0829   ALKPHOS 86 08/16/2017 1404   BILITOT 0.9 12/26/2017 0829   BILITOT 0.78 08/16/2017 1404   GFRNONAA 31 (L) 12/26/2017 0829   GFRAA 35 (L) 12/26/2017 0829     ASSESSMENT and PLAN:   Multiple myeloma not having achieved remission ([facility]) Thomas [name] is an 83 year old man with recently progressed multiple myeloma currently on treatment with Revlimid, Velcade, and Dexamethasone.  Thomas [name] is tolerating treatment well and will continue to receive his treatment today.  I refilled his Acyclovir today and requested Diane RN (Dr. Worthy Flank desk nurse) get his Revlimid refilled.  Thomas [name] will continue on with his treatment, and will return here for weekly labs and Velcade.  He will have a repeat myeloma panel on 4/16 and f/u with Dr. Julien Nordmann on 4/23.  This was reviewed with Dr. Julien Nordmann in detail.  The patient is in agreement.     Orders Placed This Encounter  Procedures  . Lactate dehydrogenase (LDH)    Standing Status:   Future    Standing Expiration Date:   12/26/2018  . Beta 2 microglobulin    Standing Status:   Future    Standing Expiration Date:   12/26/2018  . QIG  (Quant. immunoglobulins  - IgG, IgA, IgM)    Standing Status:   Future    Standing Expiration Date:   12/26/2018  . Kappa/lambda light chains    Standing Status:   Future    Standing Expiration Date:   12/26/2018    All questions were answered. The patient knows to call the clinic with any problems, questions or concerns. We can certainly see the patient much sooner if necessary.  A total of (30) minutes of face-to-face time was spent with this patient with greater than 50% of that time in counseling and care-coordination.  This note was electronically signed. Scot Dock, NP 12/26/2017

## 2017-12-26 NOTE — Progress Notes (Signed)
Per Dr Julien Nordmann ok to tx today with platelets of 86 and creatinine of 1.96.

## 2017-12-27 ENCOUNTER — Telehealth: Payer: Self-pay | Admitting: *Deleted

## 2017-12-27 NOTE — Telephone Encounter (Signed)
FYI "Sharyn Lull with [facility] calling in reference to Revlimid order we have not received>  Patient reports office has sent order.  Could order be faxed, or sent  eRx again.  Revlimid cannot be called in and requires authorization with each order."  Call transferred to providers nurse.

## 2018-01-01 ENCOUNTER — Other Ambulatory Visit: Payer: Self-pay | Admitting: *Deleted

## 2018-01-01 DIAGNOSIS — C9000 Multiple myeloma not having achieved remission: Secondary | ICD-10-CM

## 2018-01-01 MED ORDER — LENALIDOMIDE 10 MG PO CAPS: 10.0000 mg | ORAL_CAPSULE | Freq: Every day | ORAL | 0 refills | Status: DC

## 2018-01-01 MED FILL — ACYCLOVIR 200 MG CAP: 200 | 30 days supply | Qty: 60 | Fill #1

## 2018-01-01 NOTE — Telephone Encounter (Signed)
Revlimid Rx Refill resent to [facility].

## 2018-01-02 ENCOUNTER — Other Ambulatory Visit: Payer: Self-pay | Admitting: Medical Oncology

## 2018-01-02 ENCOUNTER — Inpatient Hospital Stay: Payer: Medicare Other | Attending: Internal Medicine

## 2018-01-02 ENCOUNTER — Inpatient Hospital Stay: Payer: Medicare Other

## 2018-01-02 VITALS — BP 128/62 | HR 51 | Temp 97.9°F | Resp 16

## 2018-01-02 DIAGNOSIS — Z7982 Long term (current) use of aspirin: Secondary | ICD-10-CM | POA: Diagnosis not present

## 2018-01-02 DIAGNOSIS — C9000 Multiple myeloma not having achieved remission: Secondary | ICD-10-CM

## 2018-01-02 DIAGNOSIS — E860 Dehydration: Secondary | ICD-10-CM | POA: Diagnosis not present

## 2018-01-02 DIAGNOSIS — Z5112 Encounter for antineoplastic immunotherapy: Secondary | ICD-10-CM | POA: Insufficient documentation

## 2018-01-02 DIAGNOSIS — N289 Disorder of kidney and ureter, unspecified: Secondary | ICD-10-CM | POA: Diagnosis not present

## 2018-01-02 DIAGNOSIS — Z79899 Other long term (current) drug therapy: Secondary | ICD-10-CM | POA: Insufficient documentation

## 2018-01-02 LAB — CMP (CANCER CENTER ONLY)
ALT: 18 U/L (ref 0–55)
AST: 15 U/L (ref 5–34)
Albumin: 3.3 g/dL — ABNORMAL LOW (ref 3.5–5.0)
Alkaline Phosphatase: 86 U/L (ref 40–150)
Anion gap: 4 (ref 3–11)
BUN: 30 mg/dL — ABNORMAL HIGH (ref 7–26)
CO2: 28 mmol/L (ref 22–29)
Calcium: 9.6 mg/dL (ref 8.4–10.4)
Chloride: 108 mmol/L (ref 98–109)
Creatinine: 1.89 mg/dL — ABNORMAL HIGH (ref 0.70–1.30)
GFR, Est AFR Am: 37 mL/min — ABNORMAL LOW (ref >60)
GFR, Estimated: 32 mL/min — ABNORMAL LOW (ref >60)
Glucose, Bld: 89 mg/dL (ref 70–140)
Potassium: 4.2 mmol/L (ref 3.5–5.1)
Sodium: 140 mmol/L (ref 136–145)
Total Bilirubin: 0.6 mg/dL (ref 0.2–1.2)
Total Protein: 5.8 g/dL — ABNORMAL LOW (ref 6.4–8.3)

## 2018-01-02 LAB — CBC WITH DIFFERENTIAL (CANCER CENTER ONLY)
Basophils Absolute: 0 10*3/uL (ref 0.0–0.1)
Basophils Relative: 0 %
Eosinophils Absolute: 0.1 10*3/uL (ref 0.0–0.5)
Eosinophils Relative: 1 %
HCT: 37.1 % — ABNORMAL LOW (ref 38.4–49.9)
Hemoglobin: 12.4 g/dL — ABNORMAL LOW (ref 13.0–17.1)
Lymphocytes Relative: 28 %
Lymphs Abs: 1.7 10*3/uL (ref 0.9–3.3)
MCH: 32.4 pg (ref 27.2–33.4)
MCHC: 33.4 g/dL (ref 32.0–36.0)
MCV: 96.9 fL (ref 79.3–98.0)
Monocytes Absolute: 0.9 10*3/uL (ref 0.1–0.9)
Monocytes Relative: 14 %
Neutro Abs: 3.5 10*3/uL (ref 1.5–6.5)
Neutrophils Relative %: 57 %
Platelet Count: 113 10*3/uL — ABNORMAL LOW (ref 140–400)
RBC: 3.83 MIL/uL — ABNORMAL LOW (ref 4.20–5.82)
RDW: 16.9 % — ABNORMAL HIGH (ref 11.0–14.6)
WBC Count: 6.2 10*3/uL (ref 4.0–10.3)

## 2018-01-02 MED ORDER — LENALIDOMIDE 10 MG PO CAPS: 10.0000 mg | ORAL_CAPSULE | Freq: Every day | ORAL | 0 refills | Status: DC

## 2018-01-02 MED ORDER — BORTEZOMIB CHEMO SQ INJECTION 3.5 MG (2.5MG/ML)
1.3000 mg/m2 | Freq: Once | INTRAMUSCULAR | Status: AC
  Administered 2018-01-02: 2.5 mg via SUBCUTANEOUS
  Filled 2018-01-02: qty 2.5

## 2018-01-02 MED ORDER — PROCHLORPERAZINE MALEATE 10 MG PO TABS
ORAL_TABLET | ORAL | Status: AC
  Filled 2018-01-02: qty 1

## 2018-01-02 MED ORDER — PROCHLORPERAZINE MALEATE 10 MG PO TABS
10.0000 mg | ORAL_TABLET | Freq: Once | ORAL | Status: AC
  Administered 2018-01-02: 10 mg via ORAL

## 2018-01-02 NOTE — Progress Notes (Signed)
Faxed revlimid to [facility]

## 2018-01-02 NOTE — Patient Instructions (Signed)
[facility] Discharge Instructions for Patients Receiving Chemotherapy  Today you received the following chemotherapy agents: Bortezomib (Velcade)  To help prevent nausea and vomiting after your treatment, we encourage you to take your nausea medication as directed.    If you develop nausea and vomiting that is not controlled by your nausea medication, call the clinic.   BELOW ARE SYMPTOMS THAT SHOULD BE REPORTED IMMEDIATELY:  *FEVER GREATER THAN 100.5 F  *CHILLS WITH OR WITHOUT FEVER  NAUSEA AND VOMITING THAT IS NOT CONTROLLED WITH YOUR NAUSEA MEDICATION  *UNUSUAL SHORTNESS OF BREATH  *UNUSUAL BRUISING OR BLEEDING  TENDERNESS IN MOUTH AND THROAT WITH OR WITHOUT PRESENCE OF ULCERS  *URINARY PROBLEMS  *BOWEL PROBLEMS  UNUSUAL RASH Items with * indicate a potential emergency and should be followed up as soon as possible.  Feel free to call the clinic should you have any questions or concerns. The clinic phone number is [phone].  Please show the CHEMO ALERT CARD at check-in to the Emergency Department and triage nurse.   

## 2018-01-02 NOTE — Progress Notes (Signed)
Per Dr Julien Nordmann ok to tx with creatinine of 1.89.

## 2018-01-03 ENCOUNTER — Other Ambulatory Visit: Payer: Self-pay | Admitting: Interventional Cardiology

## 2018-01-03 NOTE — Telephone Encounter (Signed)
Is it okay to refill this medication?

## 2018-01-03 NOTE — Telephone Encounter (Signed)
Ok to fill. Thanks

## 2018-01-09 ENCOUNTER — Inpatient Hospital Stay: Payer: Medicare Other

## 2018-01-09 VITALS — BP 138/54 | HR 54 | Temp 98.3°F | Resp 17 | Ht 66.0 in | Wt 174.4 lb

## 2018-01-09 DIAGNOSIS — C9000 Multiple myeloma not having achieved remission: Secondary | ICD-10-CM

## 2018-01-09 DIAGNOSIS — Z5112 Encounter for antineoplastic immunotherapy: Secondary | ICD-10-CM | POA: Diagnosis not present

## 2018-01-09 LAB — CBC WITH DIFFERENTIAL (CANCER CENTER ONLY)
Basophils Absolute: 0 10*3/uL (ref 0.0–0.1)
Basophils Relative: 0 %
Eosinophils Absolute: 0.2 10*3/uL (ref 0.0–0.5)
Eosinophils Relative: 3 %
HCT: 36.6 % — ABNORMAL LOW (ref 38.4–49.9)
Hemoglobin: 12.2 g/dL — ABNORMAL LOW (ref 13.0–17.1)
Lymphocytes Relative: 25 %
Lymphs Abs: 1.4 10*3/uL (ref 0.9–3.3)
MCH: 32.5 pg (ref 27.2–33.4)
MCHC: 33.5 g/dL (ref 32.0–36.0)
MCV: 97 fL (ref 79.3–98.0)
Monocytes Absolute: 0.4 10*3/uL (ref 0.1–0.9)
Monocytes Relative: 7 %
Neutro Abs: 3.6 10*3/uL (ref 1.5–6.5)
Neutrophils Relative %: 65 %
Platelet Count: 96 10*3/uL — ABNORMAL LOW (ref 140–400)
RBC: 3.77 MIL/uL — ABNORMAL LOW (ref 4.20–5.82)
RDW: 17.6 % — ABNORMAL HIGH (ref 11.0–14.6)
Smear Review: 1
WBC Count: 5.5 10*3/uL (ref 4.0–10.3)

## 2018-01-09 LAB — CMP (CANCER CENTER ONLY)
ALT: 15 U/L (ref 0–55)
AST: 16 U/L (ref 5–34)
Albumin: 3.3 g/dL — ABNORMAL LOW (ref 3.5–5.0)
Alkaline Phosphatase: 84 U/L (ref 40–150)
Anion gap: 7 (ref 3–11)
BUN: 22 mg/dL (ref 7–26)
CO2: 28 mmol/L (ref 22–29)
Calcium: 9.4 mg/dL (ref 8.4–10.4)
Chloride: 106 mmol/L (ref 98–109)
Creatinine: 1.84 mg/dL — ABNORMAL HIGH (ref 0.70–1.30)
GFR, Est AFR Am: 38 mL/min — ABNORMAL LOW (ref >60)
GFR, Estimated: 33 mL/min — ABNORMAL LOW (ref >60)
Glucose, Bld: 94 mg/dL (ref 70–140)
Potassium: 3.6 mmol/L (ref 3.5–5.1)
Sodium: 141 mmol/L (ref 136–145)
Total Bilirubin: 0.7 mg/dL (ref 0.2–1.2)
Total Protein: 5.7 g/dL — ABNORMAL LOW (ref 6.4–8.3)

## 2018-01-09 MED ORDER — PROCHLORPERAZINE MALEATE 10 MG PO TABS
10.0000 mg | ORAL_TABLET | Freq: Once | ORAL | Status: AC
  Administered 2018-01-09: 10 mg via ORAL

## 2018-01-09 MED ORDER — PROCHLORPERAZINE MALEATE 10 MG PO TABS
ORAL_TABLET | ORAL | Status: AC
  Filled 2018-01-09: qty 1

## 2018-01-09 MED ORDER — BORTEZOMIB CHEMO SQ INJECTION 3.5 MG (2.5MG/ML)
1.3000 mg/m2 | Freq: Once | INTRAMUSCULAR | Status: AC
  Administered 2018-01-09: 2.5 mg via SUBCUTANEOUS
  Filled 2018-01-09: qty 2.5

## 2018-01-09 NOTE — Progress Notes (Signed)
Per Dr Julien Nordmann, [place] to give Velcade today with CRT of 1.84 and PLT of 96

## 2018-01-09 NOTE — Patient Instructions (Signed)
[facility] Discharge Instructions for Patients Receiving Chemotherapy  Today you received the following chemotherapy agents Velcade To help prevent nausea and vomiting after your treatment, we encourage you to take your nausea medication as prescribed.   If you develop nausea and vomiting that is not controlled by your nausea medication, call the clinic.   BELOW ARE SYMPTOMS THAT SHOULD BE REPORTED IMMEDIATELY:  *FEVER GREATER THAN 100.5 F  *CHILLS WITH OR WITHOUT FEVER  NAUSEA AND VOMITING THAT IS NOT CONTROLLED WITH YOUR NAUSEA MEDICATION  *UNUSUAL SHORTNESS OF BREATH  *UNUSUAL BRUISING OR BLEEDING  TENDERNESS IN MOUTH AND THROAT WITH OR WITHOUT PRESENCE OF ULCERS  *URINARY PROBLEMS  *BOWEL PROBLEMS  UNUSUAL RASH Items with * indicate a potential emergency and should be followed up as soon as possible.  Feel free to call the clinic should you have any questions or concerns. The clinic phone number is [phone].  Please show the CHEMO ALERT CARD at check-in to the Emergency Department and triage nurse.   

## 2018-01-16 ENCOUNTER — Inpatient Hospital Stay: Payer: Medicare Other

## 2018-01-16 VITALS — BP 114/72 | HR 67 | Temp 97.9°F | Resp 17

## 2018-01-16 DIAGNOSIS — Z5112 Encounter for antineoplastic immunotherapy: Secondary | ICD-10-CM | POA: Diagnosis not present

## 2018-01-16 DIAGNOSIS — C9000 Multiple myeloma not having achieved remission: Secondary | ICD-10-CM

## 2018-01-16 LAB — CMP (CANCER CENTER ONLY)
ALT: 27 U/L (ref 0–55)
AST: 16 U/L (ref 5–34)
Albumin: 3.5 g/dL (ref 3.5–5.0)
Alkaline Phosphatase: 111 U/L (ref 40–150)
Anion gap: 7 (ref 3–11)
BUN: 22 mg/dL (ref 7–26)
CO2: 28 mmol/L (ref 22–29)
Calcium: 9.7 mg/dL (ref 8.4–10.4)
Chloride: 104 mmol/L (ref 98–109)
Creatinine: 1.73 mg/dL — ABNORMAL HIGH (ref 0.70–1.30)
GFR, Est AFR Am: 41 mL/min — ABNORMAL LOW (ref >60)
GFR, Estimated: 35 mL/min — ABNORMAL LOW (ref >60)
Glucose, Bld: 90 mg/dL (ref 70–140)
Potassium: 3.9 mmol/L (ref 3.5–5.1)
Sodium: 139 mmol/L (ref 136–145)
Total Bilirubin: 0.7 mg/dL (ref 0.2–1.2)
Total Protein: 6.1 g/dL — ABNORMAL LOW (ref 6.4–8.3)

## 2018-01-16 LAB — CBC WITH DIFFERENTIAL (CANCER CENTER ONLY)
Basophils Absolute: 0 10*3/uL (ref 0.0–0.1)
Basophils Relative: 0 %
Eosinophils Absolute: 0.3 10*3/uL (ref 0.0–0.5)
Eosinophils Relative: 3 %
HCT: 41.1 % (ref 38.4–49.9)
Hemoglobin: 13.9 g/dL (ref 13.0–17.1)
Lymphocytes Relative: 28 %
Lymphs Abs: 2 10*3/uL (ref 0.9–3.3)
MCH: 32.9 pg (ref 27.2–33.4)
MCHC: 33.8 g/dL (ref 32.0–36.0)
MCV: 97.4 fL (ref 79.3–98.0)
Monocytes Absolute: 1 10*3/uL — ABNORMAL HIGH (ref 0.1–0.9)
Monocytes Relative: 13 %
Neutro Abs: 4.1 10*3/uL (ref 1.5–6.5)
Neutrophils Relative %: 56 %
Platelet Count: 88 10*3/uL — ABNORMAL LOW (ref 140–400)
RBC: 4.22 MIL/uL (ref 4.20–5.82)
RDW: 16.6 % — ABNORMAL HIGH (ref 11.0–14.6)
WBC Count: 7.3 10*3/uL (ref 4.0–10.3)

## 2018-01-16 LAB — LACTATE DEHYDROGENASE: LDH: 240 U/L (ref 125–245)

## 2018-01-16 MED ORDER — PROCHLORPERAZINE MALEATE 10 MG PO TABS
ORAL_TABLET | ORAL | Status: AC
  Filled 2018-01-16: qty 1

## 2018-01-16 MED ORDER — BORTEZOMIB CHEMO SQ INJECTION 3.5 MG (2.5MG/ML)
1.3000 mg/m2 | Freq: Once | INTRAMUSCULAR | Status: AC
  Administered 2018-01-16: 2.5 mg via SUBCUTANEOUS
  Filled 2018-01-16: qty 2.5

## 2018-01-16 MED ORDER — PROCHLORPERAZINE MALEATE 10 MG PO TABS
10.0000 mg | ORAL_TABLET | Freq: Once | ORAL | Status: AC
  Administered 2018-01-16: 10 mg via ORAL

## 2018-01-16 NOTE — Progress Notes (Signed)
Per Dr. Julien Nordmann, ok to treat with platelets of 88 and a creatinine of 1.73

## 2018-01-16 NOTE — Patient Instructions (Signed)
[facility] Discharge Instructions for Patients Receiving Chemotherapy  Today you received the following chemotherapy agents Velcade To help prevent nausea and vomiting after your treatment, we encourage you to take your nausea medication as prescribed.   If you develop nausea and vomiting that is not controlled by your nausea medication, call the clinic.   BELOW ARE SYMPTOMS THAT SHOULD BE REPORTED IMMEDIATELY:  *FEVER GREATER THAN 100.5 F  *CHILLS WITH OR WITHOUT FEVER  NAUSEA AND VOMITING THAT IS NOT CONTROLLED WITH YOUR NAUSEA MEDICATION  *UNUSUAL SHORTNESS OF BREATH  *UNUSUAL BRUISING OR BLEEDING  TENDERNESS IN MOUTH AND THROAT WITH OR WITHOUT PRESENCE OF ULCERS  *URINARY PROBLEMS  *BOWEL PROBLEMS  UNUSUAL RASH Items with * indicate a potential emergency and should be followed up as soon as possible.  Feel free to call the clinic should you have any questions or concerns. The clinic phone number is [phone].  Please show the CHEMO ALERT CARD at check-in to the Emergency Department and triage nurse.   

## 2018-01-17 LAB — IGG, IGA, IGM
IgA: 24 mg/dL — ABNORMAL LOW (ref 61–437)
IgG (Immunoglobin G), Serum: 698 mg/dL — ABNORMAL LOW (ref 700–1600)
IgM (Immunoglobulin M), Srm: 36 mg/dL (ref 15–143)

## 2018-01-17 LAB — KAPPA/LAMBDA LIGHT CHAINS
Kappa free light chain: 41.4 mg/L — ABNORMAL HIGH (ref 3.3–19.4)
Kappa, lambda light chain ratio: 1.57 (ref 0.26–1.65)
Lambda free light chains: 26.4 mg/L — ABNORMAL HIGH (ref 5.7–26.3)

## 2018-01-17 LAB — BETA 2 MICROGLOBULIN, SERUM: Beta-2 Microglobulin: 5.5 mg/L — ABNORMAL HIGH (ref 0.6–2.4)

## 2018-01-18 MED FILL — DEXAMETHASONE 4 MG TABLET: 4 | 84 days supply | Qty: 60 | Fill #0

## 2018-01-21 NOTE — Progress Notes (Addendum)
[place]  [phone]) [phone] [phone]  Clinic Follow up Note   Patient Care Team: [name], MD as PCP - General (Family Medicine) Belva Crome, MD as PCP - Cardiology (Cardiology) 01/23/2018  SUMMARY OF ONCOLOGIC HISTORY:   Multiple myeloma not having achieved remission ([place])   01/21/2010 Initial Diagnosis    Smoldering Multiple myeloma, IgG Kappa. (normal calcium, hemoglobin, negative skeletal survey, creatinine less than 2).       01/21/2010 Bone Marrow Biopsy    11 % plasma cells.       02/18/2010 Procedure    Left kidney biopsy showed moderate to severe arterial nephrosclerosis with moderate to severe tubulointerstitial scarring. Immunofluorescence microsocopy showed monoclonal kappa chain stain along the tubular BM.      11/02/2012 Imaging    Metastatic survey showed no acute changes from prior.      11/02/2012 Tumor Marker    Serum kappa light chains was 60.8.  M protein on serum protein electrophoresis was 1.62 g/dL.       11/09/2012 Tumor Marker    Beta 2 microglobulin was 2.58      03/21/2013 Tumor Marker    IgG level was 2020, IgA level 51, IgM 16.      11/18/2013 Tumor Marker    IgG 1900; Kappa light chains 62.5; M-spike, 1.45 g/dL.  Creatinine 1.8.      11/25/2013 Imaging    Metastatic skeletal survey negative for lytic lesions. Faint areas of radiolucency in the frontal bones of the skull and mid-shaft of the left humerus felt to be stable.       07/24/2014 Tumor Marker    IgG 1920, Kappa light chain 86, Kappa lamda ratio 54; LDH 206, Creatinine 1.9      10/13/2017 Progression    Increase in kappa light chain.  Skeletal survey was done and negative.  Bone Marrow biopsy revealed increase in plasma cells to 15%.       11/07/2017 -  Chemotherapy    Systemic therapy with weekly Velcade 1.3 mg/KG subcutaneously, Revlimid 25 mg p.o. daily for 21 days every 4 weeks as well as Decadron 20 mg p.o. Weekly. ([facility])      PRIOR  THERAPY: None  CURRENT THERAPY: Systemic therapy with weekly Velcade 1.3 mg/KG subcutaneously, Revlimid 25 mg p.o. daily for 21 days every 4 weeks as well as Decadron 20 mg p.o. weekly.  First dose November 07, 2017.    INTERVAL HISTORY: Mr. Mokry returns for follow up as scheduled prior to cycle 4 day 15 weekly velcade/dexa. He is due to take his last revlimid tonight to complete 21 days. Needs a refill. He feels he is tolerating treatment well. Has stable fatigue but continues all activities, takes rest periods occasionally. He worked outside yesterday and did not drink well. Feels occasionally "tipsy" when he stands up after prolonged sitting. No fall. Occasional night sweats are tolerable, he attributes to lupron injections. Usually has BM q3-4 days then has normal evacuation. Has required dulcolax in the past but he feels his bowel pattern is normal for him. No hematochezia. He notes a slight "haze" in his vision since 11/2017. He had cataract and glaucoma surgery in 10/2017 and felt OK until his f/u in February. Not much better with reading glasses. He wonders if this is related to recent surgery or current chemotherapy. Denies vision loss, double vision, redness or eye pain. Has an eye appt with Dr. Rolanda Jay this week at [facility]. He otherwise has no  new concerns today.   REVIEW OF SYSTEMS:   Constitutional: Denies fevers, chills (+) intentional weight loss, eating healthy and being active  Eyes: Denies double vision, vision loss, eye pain, or redness (+) slight "haze" over eyes (+) s/p cataract and glaucoma surgery 10/2017  Ears, nose, mouth, throat, and face: Denies mucositis or sore throat Respiratory: Denies cough, dyspnea or wheezes Cardiovascular: Denies palpitation, chest discomfort or lower extremity swelling (+) on lasix  Gastrointestinal:  Denies nausea, vomiting, constipation, diarrhea, hematochezia, heartburn or change in bowel habits (+) normal BM q3-4 days  Skin: Denies  abnormal skin rashes Lymphatics: Denies new lymphadenopathy or easy bruising Neurological:Denies numbness, tingling or new weaknesses (+) occasionally "tipsy" with standing after prolonged sitting  Behavioral/Psych: Mood is stable, no new changes  All other systems were reviewed with the patient and are negative.  MEDICAL HISTORY:  Past Medical History:  Diagnosis Date  . BPH (benign prostatic hypertrophy)   . Chronic kidney disease    Hx of bilateral renal cysts.  . Glaucoma   . Heart murmur   . History of atrial fibrillation 03/09/09  . Hx of epiglottitis    2011  . Hypercholesterolemia   . Hypertension   . Mild aortic stenosis   . Prostate cancer ([place]) 7/12   T1C Ad Ca  s/p TURP, observation only  . Sleep apnea   . Smoldering multiple myeloma ([place])     SURGICAL HISTORY: Past Surgical History:  Procedure Laterality Date  . ADRENALECTOMY  02/12/11   Left, for Pheochromocytoma  . CARDIOVERSION N/A 12/26/2013   Procedure: CARDIOVERSION;  Surgeon: [name], MD;  Location: [facility];  Service: Cardiovascular;  Laterality: N/A;  . CHOLECYSTECTOMY  02/12/11  . GASTRECTOMY  1984   S/P gastrectomy for ulcer  . KNEE SURGERY  1971   S/P Right knee surgery  . TEE WITHOUT CARDIOVERSION N/A 12/26/2013   Procedure: TRANSESOPHAGEAL ECHOCARDIOGRAM (TEE);  Surgeon: [name], MD;  Location: [facility];  Service: Cardiovascular;  Laterality: N/A;  . TRANSURETHRAL RESECTION OF PROSTATE  7/12    I have reviewed the social history and family history with the patient and they are unchanged from previous note.  ALLERGIES:  is allergic to benadryl [diphenhydramine hcl]; ceftriaxone sodium; contrast media [iodinated diagnostic agents]; nsaids; penicillins; levaquin [levofloxacin]; and chlorhexidine.  MEDICATIONS:  Current Outpatient Medications  Medication Sig Dispense Refill  . acetaminophen (TYLENOL) 500 MG tablet Take 1,000 mg by mouth at bedtime.    Marland Kitchen acyclovir (ZOVIRAX) 200 MG  capsule Take 1 capsule (200 mg total) by mouth 2 (two) times daily. 60 capsule 4  . amiodarone (PACERONE) 200 MG tablet TAKE 1/2 TABLETS BY MOUTH DAILY. 45 tablet 3  . amLODipine (NORVASC) 5 MG tablet Take 1 tablet (5 mg total) by mouth daily. 90 tablet 2  . aspirin 81 MG tablet Take 81 mg by mouth daily.    . brimonidine-timolol (COMBIGAN) 0.2-0.5 % ophthalmic solution Place 1 drop into both eyes 2 (two) times daily.     . Cholecalciferol (VITAMIN D3) 1000 UNITS CAPS Take 1 capsule by mouth daily.     Marland Kitchen dexamethasone (DECADRON) 4 MG tablet 5 tablets p.o. every week start with the first dose of Velcade. 80 tablet 4  . furosemide (LASIX) 40 MG tablet Take 1 tablet (40 mg total) by mouth daily. 90 tablet 3  . latanoprost (XALATAN) 0.005 % ophthalmic solution INSTILL 1 DROP INTO BOTH EYES EVERY DAY AT NIGHT  6  . lenalidomide (REVLIMID) 10  MG capsule Take 1 capsule (10 mg total) by mouth daily. Take for 21 days on, 7 days off, repeat every 28 days auth 12/14/17 [id number] adult male. 21 capsule 0  . levothyroxine (SYNTHROID, LEVOTHROID) 50 MCG tablet TAKE 1 TABLET BY MOUTH DAILY BEFORE BREAKFAST. 90 tablet 2  . prochlorperazine (COMPAZINE) 10 MG tablet Take 1 tablet (10 mg total) by mouth every 6 (six) hours as needed for nausea or vomiting. 30 tablet 0  . TRAVATAN Z 0.004 % SOLN ophthalmic solution Place 1 drop into both eyes at bedtime.     No current facility-administered medications for this visit.     PHYSICAL EXAMINATION: ECOG PERFORMANCE STATUS: 1 - Symptomatic but completely ambulatory  Vitals:   01/23/18 0905  BP: (!) 107/58  Pulse: 72  Resp: 17  Temp: 97.8 F (36.6 C)  SpO2: 100%   Filed Weights   01/23/18 0905  Weight: 171 lb 8 oz (77.8 kg)    GENERAL:alert, no distress and comfortable SKIN: skin color, texture, turgor are normal, no rashes or significant lesions EYES: normal, Conjunctiva are pink and non-injected, sclera clear OROPHARYNX:no exudate, no erythema and lips,  buccal mucosa, and tongue normal  LYMPH:  no palpable cervical or supraclavicular lymphadenopathy LUNGS: clear to auscultation with normal breathing effort HEART: regular rate & rhythm and no murmurs (+) trace bilateral lower extremity edema ABDOMEN:abdomen soft, non-tender and normal bowel sounds Musculoskeletal:no cyanosis of digits and no clubbing  NEURO: alert & oriented x 3 with fluent speech, no focal motor/sensory deficits  LABORATORY DATA:  I have reviewed the data as listed CBC Latest Ref Rng & Units 01/23/2018 01/16/2018 01/09/2018  WBC 4.0 - 10.3 K/uL 5.5 7.3 5.5  Hemoglobin 13.0 - 17.1 g/dL 13.8 13.9 12.2(L)  Hematocrit 38.4 - 49.9 % 41.4 41.1 36.6(L)  Platelets 140 - 400 K/uL 100(L) 88(L) 96(L)     CMP Latest Ref Rng & Units 01/23/2018 01/16/2018 01/09/2018  Glucose 70 - 140 mg/dL 89 90 94  BUN 7 - 26 mg/dL 26 22 22   Creatinine 0.70 - 1.30 mg/dL 2.02(H) 1.73(H) 1.84(H)  Sodium 136 - 145 mmol/L 140 139 141  Potassium 3.5 - 5.1 mmol/L 4.0 3.9 3.6  Chloride 98 - 109 mmol/L 106 104 106  CO2 22 - 29 mmol/L 26 28 28   Calcium 8.4 - 10.4 mg/dL 9.5 9.7 9.4  Total Protein 6.4 - 8.3 g/dL 5.8(L) 6.1(L) 5.7(L)  Total Bilirubin 0.2 - 1.2 mg/dL 0.9 0.7 0.7  Alkaline Phos 40 - 150 U/L 95 111 84  AST 5 - 34 U/L 17 16 16   ALT 0 - 55 U/L 18 27 15       RADIOGRAPHIC STUDIES: I have personally reviewed the radiological images as listed and agreed with the findings in the report. No results found.   ASSESSMENT & PLAN:  Multiple myeloma not having achieved remission [facility]) Mr. Kinner appears stable. He completed cycle 4 day 8 weekly velcade and 20 mg dexamethasone on 01/16/18. He will complete day 21 of current Revlimid cycle today, with 7 days off. I will refill Revlimid. He is tolerating treatment well overall. Labs reviewed, CBC and CMP stable, adequate for treatment. Creatinine is slightly elevated from baseline, BP is low, and he has occasional positional dizziness; likely secondary to  dehydration. I encouraged him to double his water intake daily and check BP when he feels dizzy. This was a shared visit with Dr. Julien Nordmann who further discussed his MM labs; IgG, IgA, and kappa free light chains have  decreased significantly, indicating good response to therapy. Will continue current regimen. He will return for weekly labs and velcade. Will f/u with Mikey Bussing, NP in 4 weeks.   He has hazy vision without vision loss. S/p eye surgery in 10/2017. He will f/u with Dr. Nancy Fetter this week for further evaluation.   PLAN: -Labs reviewed, proceed with cycle 4 day 15 weekly velcade at current dose -OK to treat with creatinine 2.02 -Repeat MM labs in 3 months  -Lab, velcade weekly x4 -Refill Revlimid  -F/u Mikey Bussing in 4 weeks  -F/u Dr. Nancy Fetter at [facility] this week as scheduled -Increase po hydration, check BP when dizzy; may need to adjust BP meds in the future   Orders Placed This Encounter  Procedures  . CBC with Differential (Cancer Center Only)    Standing Status:   Future    Number of Occurrences:   1    Standing Expiration Date:   01/24/2019  . CBC with Differential (Cancer Center Only)    Standing Status:   Standing    Number of Occurrences:   4    Standing Expiration Date:   01/24/2019  . CMP ([place] only)    Standing Status:   Standing    Number of Occurrences:   4    Standing Expiration Date:   01/24/2019  . Beta 2 microglobulin    Standing Status:   Future    Standing Expiration Date:   01/23/2019  . QIG  (Quant. immunoglobulins  - IgG, IgA, IgM)    Standing Status:   Future    Standing Expiration Date:   01/23/2019  . Kappa/lambda light chains    Standing Status:   Future    Standing Expiration Date:   01/23/2019  . Lactate dehydrogenase (LDH)    Standing Status:   Future    Standing Expiration Date:   01/23/2019   All questions were answered. The patient knows to call the clinic with any problems, questions or concerns. No barriers to learning was  detected. I spent 20 minutes counseling the patient face to face. The total time spent in the appointment was 25 minutes and more than 50% was on counseling and review of test results     Alla Feeling, NP 01/23/18   ADDENDUM: Hematology/Oncology Attending: I had a face-to-face encounter with the patient.  I recommended his care plan.  This is a very pleasant 83 years old white male with multiple myeloma and currently on treatment with weekly subcutaneous Velcade, Revlimid and Decadron.  He status post 3 cycles.  He has been tolerating his treatment fairly well with no concerning complaints. The patient had repeat myeloma panel performed recently.  I personally reviewed the myeloma panel with the patient and his wife.  His myeloma panel showed improvement of his disease. I recommended for the patient to continue his current treatment with subcutaneous weekly Velcade as well as oral Revlimid and Decadron. We will see the patient back for follow-up visit in 4 weeks for evaluation before starting the next cycle of his treatment. For the renal insufficiency, we will continue to monitor the patient closely. I recommended for the patient to call immediately if he has any concerning symptoms in the interval. Disclaimer: This note was dictated with voice recognition software. Similar sounding words can inadvertently be transcribed and may be missed upon review. Eilleen Kempf, MD 01/24/18

## 2018-01-23 ENCOUNTER — Encounter: Payer: Self-pay | Admitting: Nurse Practitioner

## 2018-01-23 ENCOUNTER — Inpatient Hospital Stay: Payer: Medicare Other

## 2018-01-23 ENCOUNTER — Telehealth: Payer: Self-pay | Admitting: Internal Medicine

## 2018-01-23 ENCOUNTER — Inpatient Hospital Stay (HOSPITAL_BASED_OUTPATIENT_CLINIC_OR_DEPARTMENT_OTHER): Payer: Medicare Other | Admitting: Nurse Practitioner

## 2018-01-23 VITALS — BP 107/58 | HR 72 | Temp 97.8°F | Resp 17 | Ht 68.0 in | Wt 171.5 lb

## 2018-01-23 DIAGNOSIS — Z79899 Other long term (current) drug therapy: Secondary | ICD-10-CM | POA: Diagnosis not present

## 2018-01-23 DIAGNOSIS — E860 Dehydration: Secondary | ICD-10-CM | POA: Diagnosis not present

## 2018-01-23 DIAGNOSIS — C9000 Multiple myeloma not having achieved remission: Secondary | ICD-10-CM | POA: Diagnosis not present

## 2018-01-23 DIAGNOSIS — N289 Disorder of kidney and ureter, unspecified: Secondary | ICD-10-CM | POA: Diagnosis not present

## 2018-01-23 DIAGNOSIS — Z5112 Encounter for antineoplastic immunotherapy: Secondary | ICD-10-CM | POA: Diagnosis not present

## 2018-01-23 LAB — CBC WITH DIFFERENTIAL (CANCER CENTER ONLY)
Basophils Absolute: 0 10*3/uL (ref 0.0–0.1)
Basophils Relative: 0 %
Eosinophils Absolute: 0.2 10*3/uL (ref 0.0–0.5)
Eosinophils Relative: 4 %
HCT: 41.4 % (ref 38.4–49.9)
Hemoglobin: 13.8 g/dL (ref 13.0–17.1)
Lymphocytes Relative: 29 %
Lymphs Abs: 1.6 10*3/uL (ref 0.9–3.3)
MCH: 32.7 pg (ref 27.2–33.4)
MCHC: 33.4 g/dL (ref 32.0–36.0)
MCV: 97.9 fL (ref 79.3–98.0)
Monocytes Absolute: 0.5 10*3/uL (ref 0.1–0.9)
Monocytes Relative: 9 %
Neutro Abs: 3.2 10*3/uL (ref 1.5–6.5)
Neutrophils Relative %: 58 %
Platelet Count: 100 10*3/uL — ABNORMAL LOW (ref 140–400)
RBC: 4.23 MIL/uL (ref 4.20–5.82)
RDW: 16.9 % — ABNORMAL HIGH (ref 11.0–14.6)
WBC Count: 5.5 10*3/uL (ref 4.0–10.3)

## 2018-01-23 LAB — CMP (CANCER CENTER ONLY)
ALT: 18 U/L (ref 0–55)
AST: 17 U/L (ref 5–34)
Albumin: 3.4 g/dL — ABNORMAL LOW (ref 3.5–5.0)
Alkaline Phosphatase: 95 U/L (ref 40–150)
Anion gap: 8 (ref 3–11)
BUN: 26 mg/dL (ref 7–26)
CO2: 26 mmol/L (ref 22–29)
Calcium: 9.5 mg/dL (ref 8.4–10.4)
Chloride: 106 mmol/L (ref 98–109)
Creatinine: 2.02 mg/dL — ABNORMAL HIGH (ref 0.70–1.30)
GFR, Est AFR Am: 34 mL/min — ABNORMAL LOW (ref >60)
GFR, Estimated: 29 mL/min — ABNORMAL LOW (ref >60)
Glucose, Bld: 89 mg/dL (ref 70–140)
Potassium: 4 mmol/L (ref 3.5–5.1)
Sodium: 140 mmol/L (ref 136–145)
Total Bilirubin: 0.9 mg/dL (ref 0.2–1.2)
Total Protein: 5.8 g/dL — ABNORMAL LOW (ref 6.4–8.3)

## 2018-01-23 MED ORDER — PROCHLORPERAZINE MALEATE 10 MG PO TABS
ORAL_TABLET | ORAL | Status: AC
  Filled 2018-01-23: qty 1

## 2018-01-23 MED ORDER — PROCHLORPERAZINE MALEATE 10 MG PO TABS
10.0000 mg | ORAL_TABLET | Freq: Once | ORAL | Status: AC
  Administered 2018-01-23: 10 mg via ORAL

## 2018-01-23 MED ORDER — BORTEZOMIB CHEMO SQ INJECTION 3.5 MG (2.5MG/ML)
1.3000 mg/m2 | Freq: Once | INTRAMUSCULAR | Status: AC
  Administered 2018-01-23: 2.5 mg via SUBCUTANEOUS
  Filled 2018-01-23: qty 2.5

## 2018-01-23 NOTE — Telephone Encounter (Signed)
Appointments scheduled AVS/Calendar printed per 4/23 los °

## 2018-01-23 NOTE — Patient Instructions (Signed)
[facility] Discharge Instructions for Patients Receiving Chemotherapy  Today you received the following chemotherapy agents Velcade To help prevent nausea and vomiting after your treatment, we encourage you to take your nausea medication as prescribed.   If you develop nausea and vomiting that is not controlled by your nausea medication, call the clinic.   BELOW ARE SYMPTOMS THAT SHOULD BE REPORTED IMMEDIATELY:  *FEVER GREATER THAN 100.5 F  *CHILLS WITH OR WITHOUT FEVER  NAUSEA AND VOMITING THAT IS NOT CONTROLLED WITH YOUR NAUSEA MEDICATION  *UNUSUAL SHORTNESS OF BREATH  *UNUSUAL BRUISING OR BLEEDING  TENDERNESS IN MOUTH AND THROAT WITH OR WITHOUT PRESENCE OF ULCERS  *URINARY PROBLEMS  *BOWEL PROBLEMS  UNUSUAL RASH Items with * indicate a potential emergency and should be followed up as soon as possible.  Feel free to call the clinic should you have any questions or concerns. The clinic phone number is [phone].  Please show the CHEMO ALERT CARD at check-in to the Emergency Department and triage nurse.   

## 2018-01-24 ENCOUNTER — Other Ambulatory Visit: Payer: Self-pay

## 2018-01-24 DIAGNOSIS — C9000 Multiple myeloma not having achieved remission: Secondary | ICD-10-CM

## 2018-01-24 MED ORDER — LENALIDOMIDE 10 MG PO CAPS: 10.0000 mg | ORAL_CAPSULE | Freq: Every day | ORAL | 0 refills | Status: DC

## 2018-01-26 ENCOUNTER — Other Ambulatory Visit: Payer: Medicare Other

## 2018-01-26 ENCOUNTER — Ambulatory Visit: Payer: Medicare Other

## 2018-01-30 ENCOUNTER — Inpatient Hospital Stay: Payer: Medicare Other

## 2018-01-30 VITALS — BP 119/72 | HR 54 | Temp 97.9°F | Resp 18 | Wt 174.5 lb

## 2018-01-30 DIAGNOSIS — Z5112 Encounter for antineoplastic immunotherapy: Secondary | ICD-10-CM | POA: Diagnosis not present

## 2018-01-30 DIAGNOSIS — C9000 Multiple myeloma not having achieved remission: Secondary | ICD-10-CM

## 2018-01-30 LAB — CMP (CANCER CENTER ONLY)
ALT: 15 U/L (ref 0–55)
AST: 18 U/L (ref 5–34)
Albumin: 3.7 g/dL (ref 3.5–5.0)
Alkaline Phosphatase: 101 U/L (ref 40–150)
Anion gap: 6 (ref 3–11)
BUN: 25 mg/dL (ref 7–26)
CO2: 26 mmol/L (ref 22–29)
Calcium: 9.7 mg/dL (ref 8.4–10.4)
Chloride: 106 mmol/L (ref 98–109)
Creatinine: 1.79 mg/dL — ABNORMAL HIGH (ref 0.70–1.30)
GFR, Est AFR Am: 39 mL/min — ABNORMAL LOW (ref >60)
GFR, Estimated: 34 mL/min — ABNORMAL LOW (ref >60)
Glucose, Bld: 143 mg/dL — ABNORMAL HIGH (ref 70–140)
Potassium: 4.4 mmol/L (ref 3.5–5.1)
Sodium: 138 mmol/L (ref 136–145)
Total Bilirubin: 0.6 mg/dL (ref 0.2–1.2)
Total Protein: 6.1 g/dL — ABNORMAL LOW (ref 6.4–8.3)

## 2018-01-30 LAB — CBC WITH DIFFERENTIAL (CANCER CENTER ONLY)
Basophils Absolute: 0 10*3/uL (ref 0.0–0.1)
Basophils Relative: 0 %
Eosinophils Absolute: 0 10*3/uL (ref 0.0–0.5)
Eosinophils Relative: 0 %
HCT: 37.9 % — ABNORMAL LOW (ref 38.4–49.9)
Hemoglobin: 12.8 g/dL — ABNORMAL LOW (ref 13.0–17.1)
Lymphocytes Relative: 17 %
Lymphs Abs: 0.8 10*3/uL — ABNORMAL LOW (ref 0.9–3.3)
MCH: 33 pg (ref 27.2–33.4)
MCHC: 33.7 g/dL (ref 32.0–36.0)
MCV: 98 fL (ref 79.3–98.0)
Monocytes Absolute: 0.1 10*3/uL (ref 0.1–0.9)
Monocytes Relative: 2 %
Neutro Abs: 3.7 10*3/uL (ref 1.5–6.5)
Neutrophils Relative %: 81 %
Platelet Count: 131 10*3/uL — ABNORMAL LOW (ref 140–400)
RBC: 3.87 MIL/uL — ABNORMAL LOW (ref 4.20–5.82)
RDW: 17.7 % — ABNORMAL HIGH (ref 11.0–14.6)
WBC Count: 4.6 10*3/uL (ref 4.0–10.3)

## 2018-01-30 MED ORDER — PROCHLORPERAZINE MALEATE 10 MG PO TABS
10.0000 mg | ORAL_TABLET | Freq: Once | ORAL | Status: AC
  Administered 2018-01-30: 10 mg via ORAL

## 2018-01-30 MED ORDER — BORTEZOMIB CHEMO SQ INJECTION 3.5 MG (2.5MG/ML)
1.3000 mg/m2 | Freq: Once | INTRAMUSCULAR | Status: AC
  Administered 2018-01-30: 2.5 mg via SUBCUTANEOUS
  Filled 2018-01-30: qty 2.5

## 2018-01-30 MED ORDER — PROCHLORPERAZINE MALEATE 10 MG PO TABS
ORAL_TABLET | ORAL | Status: AC
  Filled 2018-01-30: qty 1

## 2018-01-30 NOTE — Progress Notes (Signed)
Patient reports sharp right lateral abdominal pain. Pain increases with palpation. Denies nausea, vomiting, fever, chills, or diarrhea at this time. Dr. Julien Nordmann made aware. Patient instructed to call back to clinic or PCP if symptoms does not improve within next couple of days, per Dr. Julien Nordmann. Patient and spouse verbalized understanding. Ok to treat with 01/30/18 lab results per Dr. Julien Nordmann.

## 2018-01-30 NOTE — Patient Instructions (Signed)
[facility] Discharge Instructions for Patients Receiving Chemotherapy  Today you received the following chemotherapy agents Velcade.  To help prevent nausea and vomiting after your treatment, we encourage you to take your nausea medication as directed.  If you develop nausea and vomiting that is not controlled by your nausea medication, call the clinic.   BELOW ARE SYMPTOMS THAT SHOULD BE REPORTED IMMEDIATELY:  *FEVER GREATER THAN 100.5 F  *CHILLS WITH OR WITHOUT FEVER  NAUSEA AND VOMITING THAT IS NOT CONTROLLED WITH YOUR NAUSEA MEDICATION  *UNUSUAL SHORTNESS OF BREATH  *UNUSUAL BRUISING OR BLEEDING  TENDERNESS IN MOUTH AND THROAT WITH OR WITHOUT PRESENCE OF ULCERS  *URINARY PROBLEMS  *BOWEL PROBLEMS  UNUSUAL RASH Items with * indicate a potential emergency and should be followed up as soon as possible.  Feel free to call the clinic should you have any questions or concerns. The clinic phone number is [phone].  Please show the CHEMO ALERT CARD at check-in to the Emergency Department and triage nurse.   

## 2018-02-01 MED FILL — ACYCLOVIR 200 MG CAP: 200 | 30 days supply | Qty: 60 | Fill #2

## 2018-02-06 ENCOUNTER — Inpatient Hospital Stay: Payer: Medicare Other

## 2018-02-06 ENCOUNTER — Inpatient Hospital Stay: Payer: Medicare Other | Attending: Internal Medicine

## 2018-02-06 VITALS — BP 123/79 | HR 51 | Temp 97.6°F | Resp 18 | Wt 172.5 lb

## 2018-02-06 DIAGNOSIS — Z79899 Other long term (current) drug therapy: Secondary | ICD-10-CM | POA: Insufficient documentation

## 2018-02-06 DIAGNOSIS — C9000 Multiple myeloma not having achieved remission: Secondary | ICD-10-CM | POA: Diagnosis present

## 2018-02-06 DIAGNOSIS — R21 Rash and other nonspecific skin eruption: Secondary | ICD-10-CM | POA: Diagnosis not present

## 2018-02-06 DIAGNOSIS — I4891 Unspecified atrial fibrillation: Secondary | ICD-10-CM | POA: Insufficient documentation

## 2018-02-06 DIAGNOSIS — Z7982 Long term (current) use of aspirin: Secondary | ICD-10-CM | POA: Diagnosis not present

## 2018-02-06 DIAGNOSIS — Z8546 Personal history of malignant neoplasm of prostate: Secondary | ICD-10-CM | POA: Diagnosis not present

## 2018-02-06 DIAGNOSIS — R5383 Other fatigue: Secondary | ICD-10-CM | POA: Insufficient documentation

## 2018-02-06 DIAGNOSIS — Z5112 Encounter for antineoplastic immunotherapy: Secondary | ICD-10-CM | POA: Insufficient documentation

## 2018-02-06 LAB — CMP (CANCER CENTER ONLY)
ALT: 18 U/L (ref 0–55)
AST: 16 U/L (ref 5–34)
Albumin: 3.5 g/dL (ref 3.5–5.0)
Alkaline Phosphatase: 83 U/L (ref 40–150)
Anion gap: 6 (ref 3–11)
BUN: 23 mg/dL (ref 7–26)
CO2: 28 mmol/L (ref 22–29)
Calcium: 9.5 mg/dL (ref 8.4–10.4)
Chloride: 106 mmol/L (ref 98–109)
Creatinine: 1.82 mg/dL — ABNORMAL HIGH (ref 0.70–1.30)
GFR, Est AFR Am: 38 mL/min — ABNORMAL LOW (ref >60)
GFR, Estimated: 33 mL/min — ABNORMAL LOW (ref >60)
Glucose, Bld: 84 mg/dL (ref 70–140)
Potassium: 4 mmol/L (ref 3.5–5.1)
Sodium: 140 mmol/L (ref 136–145)
Total Bilirubin: 0.7 mg/dL (ref 0.2–1.2)
Total Protein: 5.7 g/dL — ABNORMAL LOW (ref 6.4–8.3)

## 2018-02-06 LAB — CBC WITH DIFFERENTIAL (CANCER CENTER ONLY)
Basophils Absolute: 0 10*3/uL (ref 0.0–0.1)
Basophils Relative: 0 %
Eosinophils Absolute: 0.1 10*3/uL (ref 0.0–0.5)
Eosinophils Relative: 2 %
HCT: 36.9 % — ABNORMAL LOW (ref 38.4–49.9)
Hemoglobin: 12.5 g/dL — ABNORMAL LOW (ref 13.0–17.1)
Lymphocytes Relative: 35 %
Lymphs Abs: 1.9 10*3/uL (ref 0.9–3.3)
MCH: 33.5 pg — ABNORMAL HIGH (ref 27.2–33.4)
MCHC: 33.9 g/dL (ref 32.0–36.0)
MCV: 98.9 fL — ABNORMAL HIGH (ref 79.3–98.0)
Monocytes Absolute: 0.6 10*3/uL (ref 0.1–0.9)
Monocytes Relative: 10 %
Neutro Abs: 2.8 10*3/uL (ref 1.5–6.5)
Neutrophils Relative %: 53 %
Platelet Count: 91 10*3/uL — ABNORMAL LOW (ref 140–400)
RBC: 3.73 MIL/uL — ABNORMAL LOW (ref 4.20–5.82)
RDW: 16.4 % — ABNORMAL HIGH (ref 11.0–14.6)
Smear Review: 1
WBC Count: 5.4 10*3/uL (ref 4.0–10.3)

## 2018-02-06 MED ORDER — PROCHLORPERAZINE MALEATE 10 MG PO TABS
ORAL_TABLET | ORAL | Status: AC
  Filled 2018-02-06: qty 1

## 2018-02-06 MED ORDER — PROCHLORPERAZINE MALEATE 10 MG PO TABS
10.0000 mg | ORAL_TABLET | Freq: Once | ORAL | Status: AC
  Administered 2018-02-06: 10 mg via ORAL

## 2018-02-06 MED ORDER — BORTEZOMIB CHEMO SQ INJECTION 3.5 MG (2.5MG/ML)
1.3000 mg/m2 | Freq: Once | INTRAMUSCULAR | Status: AC
  Administered 2018-02-06: 2.5 mg via SUBCUTANEOUS
  Filled 2018-02-06: qty 2.5

## 2018-02-06 NOTE — Patient Instructions (Signed)
[facility] Discharge Instructions for Patients Receiving Chemotherapy  Today you received the following chemotherapy agents Velcade.  To help prevent nausea and vomiting after your treatment, we encourage you to take your nausea medication as directed.  If you develop nausea and vomiting that is not controlled by your nausea medication, call the clinic.   BELOW ARE SYMPTOMS THAT SHOULD BE REPORTED IMMEDIATELY:  *FEVER GREATER THAN 100.5 F  *CHILLS WITH OR WITHOUT FEVER  NAUSEA AND VOMITING THAT IS NOT CONTROLLED WITH YOUR NAUSEA MEDICATION  *UNUSUAL SHORTNESS OF BREATH  *UNUSUAL BRUISING OR BLEEDING  TENDERNESS IN MOUTH AND THROAT WITH OR WITHOUT PRESENCE OF ULCERS  *URINARY PROBLEMS  *BOWEL PROBLEMS  UNUSUAL RASH Items with * indicate a potential emergency and should be followed up as soon as possible.  Feel free to call the clinic should you have any questions or concerns. The clinic phone number is [phone].  Please show the CHEMO ALERT CARD at check-in to the Emergency Department and triage nurse.   

## 2018-02-06 NOTE — Progress Notes (Signed)
Per Dr. Julien Nordmann, it is ok to treat with Velcade today with  Platelets= 91 and Creatinine= 1.82.

## 2018-02-13 ENCOUNTER — Inpatient Hospital Stay: Payer: Medicare Other

## 2018-02-13 VITALS — BP 126/57 | HR 50 | Temp 98.0°F | Resp 17

## 2018-02-13 DIAGNOSIS — Z5112 Encounter for antineoplastic immunotherapy: Secondary | ICD-10-CM | POA: Diagnosis not present

## 2018-02-13 DIAGNOSIS — C9000 Multiple myeloma not having achieved remission: Secondary | ICD-10-CM

## 2018-02-13 LAB — CMP (CANCER CENTER ONLY)
ALT: 17 U/L (ref 0–55)
AST: 16 U/L (ref 5–34)
Albumin: 3.9 g/dL (ref 3.5–5.0)
Alkaline Phosphatase: 94 U/L (ref 40–150)
Anion gap: 7 (ref 3–11)
BUN: 23 mg/dL (ref 7–26)
CO2: 29 mmol/L (ref 22–29)
Calcium: 9.7 mg/dL (ref 8.4–10.4)
Chloride: 104 mmol/L (ref 98–109)
Creatinine: 2.03 mg/dL — ABNORMAL HIGH (ref 0.70–1.30)
GFR, Est AFR Am: 34 mL/min — ABNORMAL LOW (ref >60)
GFR, Estimated: 29 mL/min — ABNORMAL LOW (ref >60)
Glucose, Bld: 97 mg/dL (ref 70–140)
Potassium: 3.9 mmol/L (ref 3.5–5.1)
Sodium: 140 mmol/L (ref 136–145)
Total Bilirubin: 1 mg/dL (ref 0.2–1.2)
Total Protein: 6.2 g/dL — ABNORMAL LOW (ref 6.4–8.3)

## 2018-02-13 LAB — CBC WITH DIFFERENTIAL (CANCER CENTER ONLY)
Basophils Absolute: 0 10*3/uL (ref 0.0–0.1)
Basophils Relative: 0 %
Eosinophils Absolute: 0.1 10*3/uL (ref 0.0–0.5)
Eosinophils Relative: 1 %
HCT: 39.4 % (ref 38.4–49.9)
Hemoglobin: 13.2 g/dL (ref 13.0–17.1)
Lymphocytes Relative: 15 %
Lymphs Abs: 0.9 10*3/uL (ref 0.9–3.3)
MCH: 33.4 pg (ref 27.2–33.4)
MCHC: 33.4 g/dL (ref 32.0–36.0)
MCV: 99.8 fL — ABNORMAL HIGH (ref 79.3–98.0)
Monocytes Absolute: 0.3 10*3/uL (ref 0.1–0.9)
Monocytes Relative: 5 %
Neutro Abs: 4.8 10*3/uL (ref 1.5–6.5)
Neutrophils Relative %: 79 %
Platelet Count: 89 10*3/uL — ABNORMAL LOW (ref 140–400)
RBC: 3.95 MIL/uL — ABNORMAL LOW (ref 4.20–5.82)
RDW: 16.8 % — ABNORMAL HIGH (ref 11.0–14.6)
WBC Count: 6.1 10*3/uL (ref 4.0–10.3)

## 2018-02-13 MED ORDER — PROCHLORPERAZINE MALEATE 10 MG PO TABS
10.0000 mg | ORAL_TABLET | Freq: Once | ORAL | Status: AC
  Administered 2018-02-13: 10 mg via ORAL

## 2018-02-13 MED ORDER — BORTEZOMIB CHEMO SQ INJECTION 3.5 MG (2.5MG/ML)
1.3000 mg/m2 | Freq: Once | INTRAMUSCULAR | Status: AC
  Administered 2018-02-13: 2.5 mg via SUBCUTANEOUS
  Filled 2018-02-13: qty 2.5

## 2018-02-13 MED ORDER — PROCHLORPERAZINE MALEATE 10 MG PO TABS
ORAL_TABLET | ORAL | Status: AC
  Filled 2018-02-13: qty 1

## 2018-02-13 NOTE — Patient Instructions (Signed)
[facility] Discharge Instructions for Patients Receiving Chemotherapy  Today you received the following chemotherapy agents Velcade To help prevent nausea and vomiting after your treatment, we encourage you to take your nausea medication as prescribed.   If you develop nausea and vomiting that is not controlled by your nausea medication, call the clinic.   BELOW ARE SYMPTOMS THAT SHOULD BE REPORTED IMMEDIATELY:  *FEVER GREATER THAN 100.5 F  *CHILLS WITH OR WITHOUT FEVER  NAUSEA AND VOMITING THAT IS NOT CONTROLLED WITH YOUR NAUSEA MEDICATION  *UNUSUAL SHORTNESS OF BREATH  *UNUSUAL BRUISING OR BLEEDING  TENDERNESS IN MOUTH AND THROAT WITH OR WITHOUT PRESENCE OF ULCERS  *URINARY PROBLEMS  *BOWEL PROBLEMS  UNUSUAL RASH Items with * indicate a potential emergency and should be followed up as soon as possible.  Feel free to call the clinic should you have any questions or concerns. The clinic phone number is [phone].  Please show the CHEMO ALERT CARD at check-in to the Emergency Department and triage nurse.   

## 2018-02-13 NOTE — Progress Notes (Signed)
Per Dr Julien Nordmann OK to treat with PLT 89 and CRT of 2.09

## 2018-02-20 ENCOUNTER — Telehealth: Payer: Self-pay | Admitting: Oncology

## 2018-02-20 ENCOUNTER — Inpatient Hospital Stay: Payer: Medicare Other

## 2018-02-20 ENCOUNTER — Inpatient Hospital Stay (HOSPITAL_BASED_OUTPATIENT_CLINIC_OR_DEPARTMENT_OTHER): Payer: Medicare Other | Admitting: Oncology

## 2018-02-20 ENCOUNTER — Other Ambulatory Visit: Payer: Self-pay

## 2018-02-20 ENCOUNTER — Encounter: Payer: Self-pay | Admitting: Oncology

## 2018-02-20 ENCOUNTER — Telehealth: Payer: Self-pay | Admitting: Interventional Cardiology

## 2018-02-20 ENCOUNTER — Other Ambulatory Visit: Payer: Self-pay | Admitting: Medical Oncology

## 2018-02-20 VITALS — BP 105/69 | HR 62 | Temp 97.7°F | Resp 17 | Ht 68.0 in | Wt 163.8 lb

## 2018-02-20 DIAGNOSIS — R5383 Other fatigue: Secondary | ICD-10-CM

## 2018-02-20 DIAGNOSIS — C9000 Multiple myeloma not having achieved remission: Secondary | ICD-10-CM | POA: Diagnosis not present

## 2018-02-20 DIAGNOSIS — Z7982 Long term (current) use of aspirin: Secondary | ICD-10-CM | POA: Diagnosis not present

## 2018-02-20 DIAGNOSIS — Z79899 Other long term (current) drug therapy: Secondary | ICD-10-CM | POA: Diagnosis not present

## 2018-02-20 DIAGNOSIS — Z5111 Encounter for antineoplastic chemotherapy: Secondary | ICD-10-CM

## 2018-02-20 DIAGNOSIS — I4891 Unspecified atrial fibrillation: Secondary | ICD-10-CM

## 2018-02-20 DIAGNOSIS — Z5112 Encounter for antineoplastic immunotherapy: Secondary | ICD-10-CM | POA: Diagnosis not present

## 2018-02-20 HISTORY — DX: Encounter for antineoplastic chemotherapy: Z51.11

## 2018-02-20 LAB — CMP (CANCER CENTER ONLY)
ALT: 14 U/L (ref 0–55)
AST: 16 U/L (ref 5–34)
Albumin: 3.7 g/dL (ref 3.5–5.0)
Alkaline Phosphatase: 80 U/L (ref 40–150)
Anion gap: 7 (ref 3–11)
BUN: 24 mg/dL (ref 7–26)
CO2: 26 mmol/L (ref 22–29)
Calcium: 9.9 mg/dL (ref 8.4–10.4)
Chloride: 101 mmol/L (ref 98–109)
Creatinine: 1.99 mg/dL — ABNORMAL HIGH (ref 0.70–1.30)
GFR, Est AFR Am: 35 mL/min — ABNORMAL LOW (ref >60)
GFR, Estimated: 30 mL/min — ABNORMAL LOW (ref >60)
Glucose, Bld: 98 mg/dL (ref 70–140)
Potassium: 4.1 mmol/L (ref 3.5–5.1)
Sodium: 134 mmol/L — ABNORMAL LOW (ref 136–145)
Total Bilirubin: 1 mg/dL (ref 0.2–1.2)
Total Protein: 5.7 g/dL — ABNORMAL LOW (ref 6.4–8.3)

## 2018-02-20 LAB — CBC WITH DIFFERENTIAL (CANCER CENTER ONLY)
Basophils Absolute: 0 10*3/uL (ref 0.0–0.1)
Basophils Relative: 0 %
Eosinophils Absolute: 0.4 10*3/uL (ref 0.0–0.5)
Eosinophils Relative: 9 %
HCT: 39.4 % (ref 38.4–49.9)
Hemoglobin: 13.5 g/dL (ref 13.0–17.1)
Lymphocytes Relative: 25 %
Lymphs Abs: 1.1 10*3/uL (ref 0.9–3.3)
MCH: 33.7 pg — ABNORMAL HIGH (ref 27.2–33.4)
MCHC: 34.3 g/dL (ref 32.0–36.0)
MCV: 98.3 fL — ABNORMAL HIGH (ref 79.3–98.0)
Monocytes Absolute: 1.1 10*3/uL — ABNORMAL HIGH (ref 0.1–0.9)
Monocytes Relative: 25 %
Neutro Abs: 1.8 10*3/uL (ref 1.5–6.5)
Neutrophils Relative %: 41 %
Platelet Count: 77 10*3/uL — ABNORMAL LOW (ref 140–400)
RBC: 4.01 MIL/uL — ABNORMAL LOW (ref 4.20–5.82)
RDW: 15.3 % — ABNORMAL HIGH (ref 11.0–14.6)
WBC Count: 4.3 10*3/uL (ref 4.0–10.3)

## 2018-02-20 MED ORDER — BORTEZOMIB CHEMO SQ INJECTION 3.5 MG (2.5MG/ML)
1.3000 mg/m2 | Freq: Once | INTRAMUSCULAR | Status: AC
  Administered 2018-02-20: 2.5 mg via SUBCUTANEOUS
  Filled 2018-02-20: qty 2.5

## 2018-02-20 MED ORDER — PROCHLORPERAZINE MALEATE 10 MG PO TABS
10.0000 mg | ORAL_TABLET | Freq: Once | ORAL | Status: AC
  Administered 2018-02-20: 10 mg via ORAL

## 2018-02-20 MED ORDER — LENALIDOMIDE 10 MG PO CAPS: 10.0000 mg | ORAL_CAPSULE | Freq: Every day | ORAL | 0 refills | Status: DC

## 2018-02-20 MED ORDER — PROCHLORPERAZINE MALEATE 10 MG PO TABS
ORAL_TABLET | ORAL | Status: AC
  Filled 2018-02-20: qty 1

## 2018-02-20 NOTE — Assessment & Plan Note (Addendum)
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 The patient has been on observation since 2011. His recent myeloma panel showed further increase in the free kappa light chain. His recent skeletal bone survey showed no concerning findings for disease progression.  He has a focus of avascular necrosis but no significant lytic lesions. The recent bone marrow biopsy and aspirate showed further increase in the plasma cells to 15%. The patient is currently undergoing treatment with subcutaneous weekly Velcade as well as Revlimid and Decadron and been tolerating this treatment fairly well. Labs reviewed.  Creatinine is stable at 1.99.  Platelet count 77,000 today.  Labs reviewed with Dr. Julien Nordmann and are adequate to proceed with treatment.  Recommend for him to proceed with cycle 4 as scheduled today. The patient will come back weekly for labs and treatment and will have a follow-up visit in 4 weeks for evaluation prior to cycle #5.  For his atrial fibrillation and irregular heart rate, recommend that he follow up with Cardiology.   The patient was advised to call immediately if he has any concerning symptoms in the interval. The patient voices understanding of current disease status and treatment options and is in agreement with the current care plan. All questions were answered. The patient knows to call the clinic with any problems, questions or concerns. We can certainly see the patient much sooner if necessary.

## 2018-02-20 NOTE — Patient Instructions (Signed)
[facility] Discharge Instructions for Patients Receiving Chemotherapy  Today you received the following chemotherapy agents Velcade.  To help prevent nausea and vomiting after your treatment, we encourage you to take your nausea medication as directed.  If you develop nausea and vomiting that is not controlled by your nausea medication, call the clinic.   BELOW ARE SYMPTOMS THAT SHOULD BE REPORTED IMMEDIATELY:  *FEVER GREATER THAN 100.5 F  *CHILLS WITH OR WITHOUT FEVER  NAUSEA AND VOMITING THAT IS NOT CONTROLLED WITH YOUR NAUSEA MEDICATION  *UNUSUAL SHORTNESS OF BREATH  *UNUSUAL BRUISING OR BLEEDING  TENDERNESS IN MOUTH AND THROAT WITH OR WITHOUT PRESENCE OF ULCERS  *URINARY PROBLEMS  *BOWEL PROBLEMS  UNUSUAL RASH Items with * indicate a potential emergency and should be followed up as soon as possible.  Feel free to call the clinic should you have any questions or concerns. The clinic phone number is [phone].  Please show the CHEMO ALERT CARD at check-in to the Emergency Department and triage nurse.   

## 2018-02-20 NOTE — Telephone Encounter (Signed)
Pt is calling into the office, as instructed by Dr Tamala Julian and Nurse, to make them aware if he is in atrial fibrillation greater than a day. Pt states he has been in afib for about 2-3 days.  Pt states it's intermittent in nature.  Pt reports that he gets dizzy and sob at times, but is in no acute distress during the course of these episodes.  Pt states he has NO cp, N/V, pre-syncopal or syncopal episodes.  Pt reports that his vital signs have remained stable, with current reading at 107/63 and HR-76.  Pt reports that his HR has not increased over 80.  Pt reports he is being compliant with taking all his cardiac meds.  Informed the pt that I will route this concern to both Dr Tamala Julian and his Nurse for further review, recommendation, and follow-up with the pt.  Informed the pt that Dr Tamala Julian is out of the office today. Advised the pt to continue his regimen in the meantime, and Dr Thompson Caul nurse will follow-up with him shortly, with further recommendations.  Pt verbalized understanding and agrees with this plan.

## 2018-02-20 NOTE — Telephone Encounter (Signed)
Pt is calling  Patient c/o Palpitations:  High priority if patient c/o lightheadedness, shortness of breath, or chest pain  1) How long have you had palpitations/irregular HR/ Afib? Are you having the symptoms now? 2-3 days and having it now  2) Are you currently experiencing lightheadedness, SOB or CP? Dizziness/SOB  3) Do you have a history of afib (atrial fibrillation) or irregular heart rhythm? Yes  4) Have you checked your BP or HR? (document readings if available):  5.21.19 BP  97/66        5.21.19   Pulse 76 5) Are you experiencing any other symptoms?

## 2018-02-20 NOTE — Telephone Encounter (Signed)
Scheduled appt per 5/21 los - unable to schedule 5/28 due to cap - logged - will call patient when appt is scheduled.

## 2018-02-20 NOTE — Progress Notes (Signed)
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 2) History of pheochromocytoma LEFT adrenal gland status post resection on 03/16/2009 3) Prostate adenocarcinoma  PRIOR THERAPY: None  CURRENT THERAPY: Systemic therapy with weekly Velcade 1.3 mg/KG subcutaneously, Revlimid 25 mg p.o. daily for 21 days every 4 weeks as well as Decadron 20 mg p.o. weekly.  First dose November 07, 2017.  Status post 3 cycles.  INTERVAL HISTORY: Thomas [name] 83 y.o. male returns for a routine follow-up visit accompanied by his wife.  The patient reports that he has noticed an irregular heart rate starting yesterday.  The patient has a history of atrial fibrillation but has been fairly well controlled with amiodarone.  He has a call into his cardiologist for a follow-up visit.  Patient reports some fatigue related to his irregular heart rate.  He has been checking his pulse at home and it has been as high as in the 90s which is unusual for him.  He denies chest pain and shortness of breath.  The patient denies fevers and chills.  Denies cough and hemoptysis.  Denies nausea, vomiting, diarrhea.  He reports intermittent constipation and uses Dulcolax as needed which is effective.  Denies bleeding.  The patient is here for evaluation prior to cycle #4 of his treatment.  MEDICAL HISTORY: Past Medical History:  Diagnosis Date  . BPH (benign prostatic hypertrophy)   . Chronic kidney disease    Hx of bilateral renal cysts.  . Glaucoma   . Heart murmur   . History of atrial fibrillation 03/09/09  . Hx of epiglottitis    2011  . Hypercholesterolemia   . Hypertension   . Mild aortic stenosis   . Prostate cancer ([facility]) 7/12   T1C Ad Ca  s/p TURP, observation only  . Sleep apnea   . Smoldering multiple myeloma (HCC)      ALLERGIES:  is allergic to benadryl [diphenhydramine hcl]; ceftriaxone sodium; contrast media [iodinated diagnostic agents]; nsaids; penicillins; levaquin [levofloxacin]; and chlorhexidine.  MEDICATIONS:  Current Outpatient Medications  Medication Sig Dispense Refill  . acetaminophen (TYLENOL) 500 MG tablet Take 1,000 mg by mouth at bedtime.    Marland Kitchen acyclovir (ZOVIRAX) 200 MG capsule Take 1 capsule (200 mg total) by mouth 2 (two) times daily. 60 capsule 4  . amiodarone (PACERONE) 200 MG tablet TAKE 1/2 TABLETS BY MOUTH DAILY. 45 tablet 3  . amLODipine (NORVASC) 5 MG tablet Take 1 tablet (5 mg total) by mouth daily. 90 tablet 2  . aspirin 81 MG tablet Take 81 mg by mouth daily.    . brimonidine-timolol (COMBIGAN) 0.2-0.5 % ophthalmic solution Place 1 drop into both eyes 2 (two) times daily.     . Cholecalciferol (VITAMIN D3) 1000 UNITS CAPS Take 2 capsules by mouth daily.     Marland Kitchen dexamethasone (DECADRON) 4 MG tablet 5 tablets p.o. every week start with the first dose of Velcade. 80 tablet 4  . furosemide (LASIX) 40 MG tablet Take 1 tablet (40 mg total) by mouth daily. 90 tablet 3  . latanoprost (XALATAN) 0.005 % ophthalmic solution INSTILL 1 DROP INTO BOTH EYES EVERY DAY AT NIGHT  6  . levothyroxine (SYNTHROID, LEVOTHROID) 50 MCG tablet TAKE 1 TABLET BY MOUTH DAILY BEFORE BREAKFAST. 90 tablet 2  . prochlorperazine (COMPAZINE) 10 MG tablet Take 1  tablet (10 mg total) by mouth every 6 (six) hours as needed for nausea or vomiting. 30 tablet 0  . lenalidomide (REVLIMID) 10 MG capsule Take 1 capsule (10 mg total) by mouth daily. Take for 21 days on, 7 days off, repeat every 28 days Adult Male    Authorization #[id number] 02/20/18 Faxed to [facility] 21 capsule 0   No current facility-administered medications for this visit.     SURGICAL HISTORY:  Past Surgical History:  Procedure Laterality Date  . ADRENALECTOMY  02/12/11   Left, for Pheochromocytoma  . CARDIOVERSION N/A 12/26/2013    Procedure: CARDIOVERSION;  Surgeon: [name], MD;  Location: [facility];  Service: Cardiovascular;  Laterality: N/A;  . CHOLECYSTECTOMY  02/12/11  . GASTRECTOMY  1984   S/P gastrectomy for ulcer  . KNEE SURGERY  1971   S/P Right knee surgery  . TEE WITHOUT CARDIOVERSION N/A 12/26/2013   Procedure: TRANSESOPHAGEAL ECHOCARDIOGRAM (TEE);  Surgeon: [name], MD;  Location: [facility];  Service: Cardiovascular;  Laterality: N/A;  . TRANSURETHRAL RESECTION OF PROSTATE  7/12    REVIEW OF SYSTEMS:   Review of Systems  Constitutional: Negative for appetite change, chills, fever. Positive for fatigue.  He has lost weight but reports he is actively trying to do so. HENT:   Negative for mouth sores, nosebleeds, sore throat and trouble swallowing.   Eyes: Negative for eye problems and icterus.  Respiratory: Negative for cough, hemoptysis, shortness of breath and wheezing.   Cardiovascular: Negative for chest pain and leg swelling.  Gastrointestinal: Negative for abdominal pain, diarrhea, nausea and vomiting. Reports constipation that is controlled with Dulcolax. Genitourinary: Negative for bladder incontinence, difficulty urinating, dysuria, frequency and hematuria.   Musculoskeletal: Negative for back pain, gait problem, neck pain and neck stiffness.  Skin: Negative for itching and rash.  Neurological: Negative for dizziness, extremity weakness, gait problem, headaches, light-headedness and seizures.  Hematological: Negative for adenopathy. Does not bruise/bleed easily.  Psychiatric/Behavioral: Negative for confusion, depression and sleep disturbance. The patient is not nervous/anxious.     PHYSICAL EXAMINATION:  Blood pressure 105/69, pulse 62, temperature 97.7 F (36.5 C), temperature source Oral, resp. rate 17, height 5' 8"  (1.727 m), weight 163 lb 12.8 oz (74.3 kg), SpO2 99 %.  ECOG PERFORMANCE STATUS: 1 - Symptomatic but completely ambulatory  Physical Exam  Constitutional:  Oriented to person, place, and time and well-developed, well-nourished, and in no distress. No distress.  HENT:  Head: Normocephalic and atraumatic.  Mouth/Throat: Oropharynx is clear and moist. No oropharyngeal exudate.  Eyes: Conjunctivae are normal. Right eye exhibits no discharge. Left eye exhibits no discharge. No scleral icterus.  Neck: Normal range of motion. Neck supple.  Cardiovascular: Normal rate, normal heart sounds and intact distal pulses.  Heart rate is irregular. Pulmonary/Chest: Effort normal and breath sounds normal. No respiratory distress. No wheezes. No rales.  Abdominal: Soft. Bowel sounds are normal. Exhibits no distension and no mass. There is no tenderness.  Musculoskeletal: Normal range of motion. Exhibits no edema.  Lymphadenopathy:    No cervical adenopathy.  Neurological: Alert and oriented to person, place, and time. Exhibits normal muscle tone. Gait normal. Coordination normal.  Skin: Skin is warm and dry. No rash noted. Not diaphoretic. No erythema. No pallor.  Psychiatric: Mood, memory and judgment normal.  Vitals reviewed.  LABORATORY DATA: Lab Results  Component Value Date   WBC 4.3 02/20/2018   HGB 13.5 02/20/2018   HCT 39.4 02/20/2018   MCV 98.3 (H)  02/20/2018   PLT 77 (L) 02/20/2018      Chemistry      Component Value Date/Time   NA 134 (L) 02/20/2018 0731   NA 138 08/16/2017 1404   K 4.1 02/20/2018 0731   K 4.5 08/16/2017 1404   CL 101 02/20/2018 0731   CL 109 (H) 03/21/2013 0958   CO2 26 02/20/2018 0731   CO2 24 08/16/2017 1404   BUN 24 02/20/2018 0731   BUN 17.3 08/16/2017 1404   CREATININE 1.99 (H) 02/20/2018 0731   CREATININE 1.8 (H) 08/16/2017 1404      Component Value Date/Time   CALCIUM 9.9 02/20/2018 0731   CALCIUM 9.9 08/16/2017 1404   ALKPHOS 80 02/20/2018 0731   ALKPHOS 86 08/16/2017 1404   AST 16 02/20/2018 0731   AST 22 08/16/2017 1404   ALT 14 02/20/2018 0731   ALT 16 08/16/2017 1404   BILITOT 1.0 02/20/2018 0731    BILITOT 0.78 08/16/2017 1404       RADIOGRAPHIC STUDIES:  No results found.   ASSESSMENT/PLAN:  Multiple myeloma not having achieved remission [facility]) This is a very pleasant 83 year old white male with IgG smoldering Loma diagnosed in April 2011. The patient has been on observation since 2011. His recent myeloma panel showed further increase in the free kappa light chain. His recent skeletal bone survey showed no concerning findings for disease progression.  He has a focus of avascular necrosis but no significant lytic lesions. The recent bone marrow biopsy and aspirate showed further increase in the plasma cells to 15%. The patient is currently undergoing treatment with subcutaneous weekly Velcade as well as Revlimid and Decadron and been tolerating this treatment fairly well. Labs reviewed.  Creatinine is stable at 1.99.  Platelet count 77,000 today.  Labs reviewed with Dr. Julien Nordmann and are adequate to proceed with treatment.  Recommend for him to proceed with cycle 4 as scheduled today. The patient will come back weekly for labs and treatment and will have a follow-up visit in 4 weeks for evaluation prior to cycle #5.  For his atrial fibrillation and irregular heart rate, recommend that he follow up with Cardiology.   The patient was advised to call immediately if he has any concerning symptoms in the interval. The patient voices understanding of current disease status and treatment options and is in agreement with the current care plan. All questions were answered. The patient knows to call the clinic with any problems, questions or concerns. We can certainly see the patient much sooner if necessary.   Orders Placed This Encounter  Procedures  . CBC with Differential (Cancer Center Only)    Standing Status:   Future    Number of Occurrences:   1    Standing Expiration Date:   02/21/2019  . CBC with Differential (Cancer Center Only)    Standing Status:   Standing    Number of  Occurrences:   20    Standing Expiration Date:   02/21/2019  . CMP ([facility] only)    Standing Status:   Standing    Number of Occurrences:   20    Standing Expiration Date:   02/21/2019   Mikey Bussing, DNP, AGPCNP-BC, AOCNP 02/20/18

## 2018-02-24 NOTE — Telephone Encounter (Signed)
He should increase amiodarone to 200 mg BID for 7 days then down to 200 mg daily. Update med list please.

## 2018-02-24 NOTE — Telephone Encounter (Signed)
Needs ECG and 24 hour holter

## 2018-02-27 ENCOUNTER — Inpatient Hospital Stay: Payer: Medicare Other

## 2018-02-27 ENCOUNTER — Inpatient Hospital Stay (HOSPITAL_BASED_OUTPATIENT_CLINIC_OR_DEPARTMENT_OTHER): Payer: Medicare Other | Admitting: Medical

## 2018-02-27 VITALS — BP 121/63 | HR 61 | Temp 97.8°F | Resp 17

## 2018-02-27 DIAGNOSIS — Z8546 Personal history of malignant neoplasm of prostate: Secondary | ICD-10-CM | POA: Diagnosis not present

## 2018-02-27 DIAGNOSIS — Z5112 Encounter for antineoplastic immunotherapy: Secondary | ICD-10-CM | POA: Diagnosis not present

## 2018-02-27 DIAGNOSIS — Z7982 Long term (current) use of aspirin: Secondary | ICD-10-CM

## 2018-02-27 DIAGNOSIS — C9000 Multiple myeloma not having achieved remission: Secondary | ICD-10-CM

## 2018-02-27 DIAGNOSIS — R21 Rash and other nonspecific skin eruption: Secondary | ICD-10-CM

## 2018-02-27 DIAGNOSIS — Z79899 Other long term (current) drug therapy: Secondary | ICD-10-CM | POA: Diagnosis not present

## 2018-02-27 DIAGNOSIS — R5383 Other fatigue: Secondary | ICD-10-CM

## 2018-02-27 DIAGNOSIS — I4891 Unspecified atrial fibrillation: Secondary | ICD-10-CM

## 2018-02-27 LAB — CBC WITH DIFFERENTIAL (CANCER CENTER ONLY)
Basophils Absolute: 0 10*3/uL (ref 0.0–0.1)
Basophils Relative: 1 %
Eosinophils Absolute: 0.2 10*3/uL (ref 0.0–0.5)
Eosinophils Relative: 6 %
HCT: 37.8 % — ABNORMAL LOW (ref 38.4–49.9)
Hemoglobin: 12.9 g/dL — ABNORMAL LOW (ref 13.0–17.1)
Lymphocytes Relative: 30 %
Lymphs Abs: 1.3 10*3/uL (ref 0.9–3.3)
MCH: 33.8 pg — ABNORMAL HIGH (ref 27.2–33.4)
MCHC: 34 g/dL (ref 32.0–36.0)
MCV: 99.4 fL — ABNORMAL HIGH (ref 79.3–98.0)
Monocytes Absolute: 0.7 10*3/uL (ref 0.1–0.9)
Monocytes Relative: 17 %
Neutro Abs: 2.1 10*3/uL (ref 1.5–6.5)
Neutrophils Relative %: 46 %
Platelet Count: 89 10*3/uL — ABNORMAL LOW (ref 140–400)
RBC: 3.8 MIL/uL — ABNORMAL LOW (ref 4.20–5.82)
RDW: 16.3 % — ABNORMAL HIGH (ref 11.0–14.6)
WBC Count: 4.4 10*3/uL (ref 4.0–10.3)

## 2018-02-27 LAB — COMPREHENSIVE METABOLIC PANEL
ALT: 15 U/L — ABNORMAL LOW (ref 17–63)
AST: 17 U/L (ref 15–41)
Albumin: 3.7 g/dL (ref 3.5–5.0)
Alkaline Phosphatase: 67 U/L (ref 38–126)
Anion gap: 11 (ref 5–15)
BUN: 25 mg/dL — ABNORMAL HIGH (ref 6–20)
CO2: 22 mmol/L (ref 22–32)
Calcium: 9.6 mg/dL (ref 8.9–10.3)
Chloride: 104 mmol/L (ref 101–111)
Creatinine, Ser: 2.02 mg/dL — ABNORMAL HIGH (ref 0.61–1.24)
GFR calc Af Amer: 34 mL/min — ABNORMAL LOW (ref >60)
GFR calc non Af Amer: 29 mL/min — ABNORMAL LOW (ref >60)
Glucose, Bld: 98 mg/dL (ref 65–99)
Potassium: 4.2 mmol/L (ref 3.5–5.1)
Sodium: 137 mmol/L (ref 135–145)
Total Bilirubin: 0.9 mg/dL (ref 0.3–1.2)
Total Protein: 5.8 g/dL — ABNORMAL LOW (ref 6.5–8.1)

## 2018-02-27 MED ORDER — PROCHLORPERAZINE MALEATE 10 MG PO TABS
ORAL_TABLET | ORAL | Status: AC
  Filled 2018-02-27: qty 1

## 2018-02-27 MED ORDER — PROCHLORPERAZINE MALEATE 10 MG PO TABS
10.0000 mg | ORAL_TABLET | Freq: Once | ORAL | Status: AC
  Administered 2018-02-27: 10 mg via ORAL

## 2018-02-27 MED ORDER — BORTEZOMIB CHEMO SQ INJECTION 3.5 MG (2.5MG/ML)
1.3000 mg/m2 | Freq: Once | INTRAMUSCULAR | Status: AC
  Administered 2018-02-27: 2.5 mg via SUBCUTANEOUS
  Filled 2018-02-27: qty 2.5

## 2018-02-27 MED ORDER — ACYCLOVIR 400 MG PO TABS: 400.0000 mg | ORAL_TABLET | Freq: Two times a day (BID) | ORAL | 0 refills | Status: DC

## 2018-02-27 MED FILL — ACYCLOVIR 400 MG TABLET: 400 | 7 days supply | Qty: 14 | Fill #0

## 2018-02-27 NOTE — Progress Notes (Signed)
Okay to treat with today's platelet and creatinine of 2.02 per Dr. Julien Nordmann.

## 2018-02-27 NOTE — Telephone Encounter (Signed)
Spoke with pt and went over recommendations per Dr. Tamala Julian.  Pt states he has been taking an extra half of Amio for a few days.  Pt feels like he converted back to NSR this morning.  Pt feeling much better at this time.  Scheduled pt to come in Friday to get EKG and confirm rhythm.  Pt planning to go back to Amio 100mg  QD for now.  Will route to Dr. Tamala Julian for review.  Advised I will call back if further instructions, otherwise we will see him Friday to check his rhythm.  Pt appreciative for call.

## 2018-02-27 NOTE — Progress Notes (Signed)
 [facility] Progress Note   Thomas [name] [id number] 12/15/1934 83 y.o.  Thomas [name] is managed by Dr. Mohamed K. Mohamed  Actively treated with chemotherapy: yes  Current Therapy: Velcade and Revlimid  Last Treated: 02/27/2018 (cycle 6, day 8)  Assessment: Plan:    Rash of face - Plan: acyclovir (ZOVIRAX) 400 MG tablet  Multiple myeloma not having achieved remission (HCC)   Rash of the right face and neck: There appears to be one small vesicle on the right cheek however the remainder of the areas do not appear consistent with shingles.  This case was discussed with Dr. Mohamed with a picture of the patient's rash also shown to him.  He recommends increasing the patient's acyclovir to 400 mg p.o. twice daily from his baseline dose of 200 mg p.o. twice daily.  He was given a prescription for this increased dose for the next 7 days.  Multiple myeloma: The patient was treated with cycle 6 of Velcade today and will continue taking Revlimid as prescribed.  He is scheduled to be seen by Dr. Mohamed on 03/20/2018.  Please see After Visit Summary for patient specific instructions.  Future Appointments  Date Time Provider Department Center  03/06/2018  8:00 AM [facility] LAB 2 [facility] None  03/06/2018  9:00 AM [facility] PROCEDURE 1 [facility] None  03/13/2018  8:00 AM [facility] LAB 3 [facility] None  03/13/2018  9:00 AM [facility] F18 [facility] None  03/20/2018 10:30 AM [facility] LAB 5 [facility] None  03/20/2018 11:00 AM Curcio, Kristin R, NP [facility] None  03/20/2018 12:00 PM [facility] B4 [facility] None  03/27/2018  8:00 AM [facility] LAB 4 [facility] None  03/27/2018  9:00 AM [facility] F19 [facility] None  04/03/2018  8:00 AM [facility] LAB 1 [facility] None  04/03/2018  9:00 AM [facility] F19 [facility] None  04/10/2018  8:00 AM [facility] LAB 3 [facility] None  04/10/2018  9:00 AM [facility] A3 [facility] None  04/11/2018 10:20 AM  Smith, Henry W, MD [facility]  04/17/2018 10:30 AM [facility] LAB 3 [facility] None  04/17/2018 11:00 AM Mohamed, Mohamed, MD [facility] None  04/17/2018 12:15 PM [facility] F21 [facility] None  04/24/2018  8:00 AM [facility] LAB 4 [facility] None  04/24/2018  9:00 AM [facility] A3 [facility] None  05/01/2018  8:00 AM [facility] LAB 3 [facility] None  05/01/2018  9:00 AM [facility] A3 [facility] None  05/08/2018  8:00 AM [facility] LAB 2 [facility] None  05/08/2018  9:00 AM [facility] D13 [facility] None  05/15/2018  8:30 AM [facility] LAB 2 [facility] None  05/15/2018  9:00 AM Mohamed, Mohamed, MD [facility] None  05/15/2018 10:15 AM [facility] H29 [facility] None    No orders of the defined types were placed in this encounter.      Subjective:   Patient ID:  Thomas [name] is a 83 y.o. (DOB 12/02/1934) male.  Chief Complaint: No chief complaint on file.   HPI Thomas [name] is an 83-year-old male who has a history of a smoldering myeloma who is now actively treated under the direction of Dr. Mohamed K. Mohamed after having a bone marrow biopsy returned showing 11% plasma cells.  He is currently treated with Velcade and Revlimid.  This provider was asked to see the patient while he was in infusion today.  The patient has a 4-day history of a rash over the right face and neck.  He is currently on acyclovir 200 mg p.o. twice daily and has had a shingles vaccine previously.  He   reports that the rash is pruritic.  He denies fevers, chills, or sweats.  He has had no change in activity.  He does acknowledge having a tick bite in the right medial distal thigh sometime within the past week.  He had no target lesion.  Medications: I have reviewed the patient's current medications.  Allergies:  Allergies  Allergen Reactions  . Benadryl [Diphenhydramine Hcl] Other (See Comments)    Bad dreams -  Hallucinations.  . Ceftriaxone Sodium Hives  . Contrast Media  [Iodinated Diagnostic Agents] Other (See Comments)    Poor kidney function  . Nsaids Other (See Comments)    REACTION: BLEEDING ULCER  . Penicillins Hives  . Levaquin [Levofloxacin]   . Chlorhexidine Rash    Use alcohol for skin prep per patient request.     Past Medical History:  Diagnosis Date  . BPH (benign prostatic hypertrophy)   . Chronic kidney disease    Hx of bilateral renal cysts.  . Glaucoma   . Heart murmur   . History of atrial fibrillation 03/09/09  . Hx of epiglottitis    2011  . Hypercholesterolemia   . Hypertension   . Mild aortic stenosis   . Prostate cancer (HCC) 7/12   T1C Ad Ca  s/p TURP, observation only  . Sleep apnea   . Smoldering multiple myeloma (HCC)     Past Surgical History:  Procedure Laterality Date  . ADRENALECTOMY  02/12/11   Left, for Pheochromocytoma  . CARDIOVERSION N/A 12/26/2013   Procedure: CARDIOVERSION;  Surgeon: Paula V Ross, MD;  Location: [facility];  Service: Cardiovascular;  Laterality: N/A;  . CHOLECYSTECTOMY  02/12/11  . GASTRECTOMY  1984   S/P gastrectomy for ulcer  . KNEE SURGERY  1971   S/P Right knee surgery  . TEE WITHOUT CARDIOVERSION N/A 12/26/2013   Procedure: TRANSESOPHAGEAL ECHOCARDIOGRAM (TEE);  Surgeon: Paula V Ross, MD;  Location: [facility];  Service: Cardiovascular;  Laterality: N/A;  . TRANSURETHRAL RESECTION OF PROSTATE  7/12    Family History  Problem Relation Age of Onset  . Prostate cancer Father   . Prostate cancer Brother   . Polymyositis Mother   . Liver disease Sister   . Liver disease Sister   . Liver disease Sister   . Melanoma Brother   . Kidney Stones Brother     Social History   Socioeconomic History  . Marital status: Married    Spouse name: Not on file  . Number of children: 3  . Years of education: Not on file  . Highest education level: Not on file  Occupational History    Employer: RETIRED  Social Needs  . Financial resource strain: Not on file  . Food insecurity:     Worry: Not on file    Inability: Not on file  . Transportation needs:    Medical: Not on file    Non-medical: Not on file  Tobacco Use  . Smoking status: Never Smoker  . Smokeless tobacco: Never Used  Substance and Sexual Activity  . Alcohol use: No  . Drug use: No  . Sexual activity: Not Currently  Lifestyle  . Physical activity:    Days per week: Not on file    Minutes per session: Not on file  . Stress: Not on file  Relationships  . Social connections:    Talks on phone: Not on file    Gets together: Not on file    Attends religious service: Not on file      Active member of club or organization: Not on file    Attends meetings of clubs or organizations: Not on file    Relationship status: Not on file  . Intimate partner violence:    Fear of current or ex partner: Not on file    Emotionally abused: Not on file    Physically abused: Not on file    Forced sexual activity: Not on file  Other Topics Concern  . Not on file  Social History Narrative  . Not on file    Past Medical History, Surgical history, Social history, and Family history were reviewed and updated as appropriate.   Please see review of systems for further details on the patient's review from today.   Review of Systems:  Review of Systems  Constitutional: Negative for chills and diaphoresis.  HENT: Negative for congestion, facial swelling and trouble swallowing.   Respiratory: Negative for cough, choking, chest tightness, shortness of breath and wheezing.   Cardiovascular: Negative for chest pain and palpitations.  Gastrointestinal: Negative for nausea and vomiting.  Musculoskeletal: Negative for back pain.  Skin: Positive for rash (Diffuse rash over the right lateral face and neck.  Status post tick bite of the right medial knee.).  Neurological: Negative for dizziness, speech difficulty and headaches.  Psychiatric/Behavioral: The patient is not nervous/anxious.     Objective:   Physical Exam:    There were no vitals taken for this visit. ECOG: 0  Physical Exam  Constitutional: No distress.  HENT:  Head: Atraumatic.  Neurological: He is alert. Coordination normal.  Skin: Skin is warm and dry. Rash (Diffuse macular erythematous rash over the right face and neck.  There is one small vesicle in the right cheek and an area of excoriation over the right lateral neck.) noted. He is not diaphoretic.  Psychiatric: He has a normal mood and affect. His behavior is normal. Judgment and thought content normal.   A picture of the patient's rash was taken for his chart however, it did not save.  Lab Review:     Component Value Date/Time   NA 137 02/27/2018 0815   NA 138 08/16/2017 1404   K 4.2 02/27/2018 0815   K 4.5 08/16/2017 1404   CL 104 02/27/2018 0815   CL 109 (H) 03/21/2013 0958   CO2 22 02/27/2018 0815   CO2 24 08/16/2017 1404   GLUCOSE 98 02/27/2018 0815   GLUCOSE 110 08/16/2017 1404   GLUCOSE 142 (H) 03/21/2013 0958   BUN 25 (H) 02/27/2018 0815   BUN 17.3 08/16/2017 1404   CREATININE 2.02 (H) 02/27/2018 0815   CREATININE 1.99 (H) 02/20/2018 0731   CREATININE 1.8 (H) 08/16/2017 1404   CALCIUM 9.6 02/27/2018 0815   CALCIUM 9.9 08/16/2017 1404   PROT 5.8 (L) 02/27/2018 0815   PROT 8.5 (H) 08/16/2017 1404   ALBUMIN 3.7 02/27/2018 0815   ALBUMIN 4.1 08/16/2017 1404   AST 17 02/27/2018 0815   AST 16 02/20/2018 0731   AST 22 08/16/2017 1404   ALT 15 (L) 02/27/2018 0815   ALT 14 02/20/2018 0731   ALT 16 08/16/2017 1404   ALKPHOS 67 02/27/2018 0815   ALKPHOS 86 08/16/2017 1404   BILITOT 0.9 02/27/2018 0815   BILITOT 1.0 02/20/2018 0731   BILITOT 0.78 08/16/2017 1404   GFRNONAA 29 (L) 02/27/2018 0815   GFRNONAA 30 (L) 02/20/2018 0731   GFRAA 34 (L) 02/27/2018 0815   GFRAA 35 (L) 02/20/2018 0731       Component Value Date/Time  WBC 4.4 02/27/2018 0738   WBC 6.9 08/16/2017 1404   WBC 7.9 01/10/2014 0841   RBC 3.80 (L) 02/27/2018 0738   HGB 12.9 (L) 02/27/2018  0738   HGB 15.6 08/16/2017 1404   HCT 37.8 (L) 02/27/2018 0738   HCT 46.3 08/16/2017 1404   PLT 89 (L) 02/27/2018 0738   PLT 198 08/16/2017 1404   MCV 99.4 (H) 02/27/2018 0738   MCV 94.9 08/16/2017 1404   MCH 33.8 (H) 02/27/2018 0738   MCHC 34.0 02/27/2018 0738   RDW 16.3 (H) 02/27/2018 0738   RDW 14.4 08/16/2017 1404   LYMPHSABS 1.3 02/27/2018 0738   LYMPHSABS 2.2 08/16/2017 1404   MONOABS 0.7 02/27/2018 0738   MONOABS 0.5 08/16/2017 1404   EOSABS 0.2 02/27/2018 0738   EOSABS 0.1 08/16/2017 1404   BASOSABS 0.0 02/27/2018 0738   BASOSABS 0.0 08/16/2017 1404   -------------------------------  Imaging from last 24 hours (if applicable):  Radiology interpretation: No results found.      This case was discussed with Dr. Mohamed. He expressed agreement with my management of this patient.  

## 2018-02-27 NOTE — Progress Notes (Signed)
Called to see pt in infusion . Pt thinks he has shingles-rash erupted 4 days ago on left face , now his neck. Tthe patient is on Acylovir, shingles vaccine 10 years ago per pt. Per Julien Nordmann, schedule with Sandi Mealy. Appt requested.

## 2018-02-27 NOTE — Patient Instructions (Signed)
[facility] Discharge Instructions for Patients Receiving Chemotherapy  Today you received the following chemotherapy agents Velcade.  To help prevent nausea and vomiting after your treatment, we encourage you to take your nausea medication as directed.  If you develop nausea and vomiting that is not controlled by your nausea medication, call the clinic.   BELOW ARE SYMPTOMS THAT SHOULD BE REPORTED IMMEDIATELY:  *FEVER GREATER THAN 100.5 F  *CHILLS WITH OR WITHOUT FEVER  NAUSEA AND VOMITING THAT IS NOT CONTROLLED WITH YOUR NAUSEA MEDICATION  *UNUSUAL SHORTNESS OF BREATH  *UNUSUAL BRUISING OR BLEEDING  TENDERNESS IN MOUTH AND THROAT WITH OR WITHOUT PRESENCE OF ULCERS  *URINARY PROBLEMS  *BOWEL PROBLEMS  UNUSUAL RASH Items with * indicate a potential emergency and should be followed up as soon as possible.  Feel free to call the clinic should you have any questions or concerns. The clinic phone number is [phone].  Please show the CHEMO ALERT CARD at check-in to the Emergency Department and triage nurse.   

## 2018-03-01 NOTE — Telephone Encounter (Signed)
Stay on 200 mg daily.

## 2018-03-02 ENCOUNTER — Encounter: Payer: Self-pay | Admitting: Interventional Cardiology

## 2018-03-02 ENCOUNTER — Ambulatory Visit: Payer: Medicare Other | Admitting: Interventional Cardiology

## 2018-03-02 VITALS — BP 118/74 | HR 70 | Ht 66.0 in | Wt 164.0 lb

## 2018-03-02 DIAGNOSIS — I495 Sick sinus syndrome: Secondary | ICD-10-CM | POA: Diagnosis not present

## 2018-03-02 DIAGNOSIS — G4733 Obstructive sleep apnea (adult) (pediatric): Secondary | ICD-10-CM | POA: Diagnosis not present

## 2018-03-02 DIAGNOSIS — I350 Nonrheumatic aortic (valve) stenosis: Secondary | ICD-10-CM | POA: Diagnosis not present

## 2018-03-02 DIAGNOSIS — Z9989 Dependence on other enabling machines and devices: Secondary | ICD-10-CM

## 2018-03-02 DIAGNOSIS — I480 Paroxysmal atrial fibrillation: Secondary | ICD-10-CM | POA: Diagnosis not present

## 2018-03-02 DIAGNOSIS — I5032 Chronic diastolic (congestive) heart failure: Secondary | ICD-10-CM

## 2018-03-02 DIAGNOSIS — Z79899 Other long term (current) drug therapy: Secondary | ICD-10-CM

## 2018-03-02 MED ORDER — AMIODARONE HCL 200 MG PO TABS: 400.0000 mg | ORAL_TABLET | Freq: Every day | ORAL | 0 refills | Status: DC

## 2018-03-02 NOTE — Telephone Encounter (Signed)
Pt seen in clinic today.  

## 2018-03-02 NOTE — Patient Instructions (Addendum)
Medication Instructions:  Take Amiodarone 400mg  once a day for 2 weeks until your follow up appointment.   Labwork: None Ordered  Testing/Procedures: None Ordered  Follow-Up: Your physician recommends that you schedule a follow-up appointment in: 2 weeks.    Any Other Special Instructions Will Be Listed Below (If Applicable).     If you need a refill on your cardiac medications before your next appointment, please call your pharmacy.

## 2018-03-02 NOTE — Progress Notes (Signed)
Cardiology Office Note    Date:  03/02/2018   ID:  Thomas [name], Thomas [name] 1935-05-21, MRN [id number]  PCP:  [name], MD  Cardiologist: [name], MD   Chief Complaint  Patient presents with  . Atrial Fibrillation  . Congestive Heart Failure    History of Present Illness:  Thomas [name] is a 83 y.o. male paroxysmal atrial fibrillation, chronic diastolic heart failure, hypertension, chronic kidney disease, unable to use anticoagulation therapy due to recurrent GU bleeding, and mild aortic stenosis.  Other medical issues include metastatic prostate CA (Lupron) and indolent multiple myeloma.  Recent recurrent atrial fibrillation.  The patient recently identified weakness and felt he was in atrial fibrillation.  I asked him to increase the dose of amiodarone 400 mg daily.  He did this for several days and then decrease back to 100 mg/day after feeling as though atrial fibrillation had resolved.  We have been treating to control rhythm because the had one severe episode of bleeding on Coumadin therapy and refuses to try anticoagulation again.  During office visit today a repeat EKG demonstrated the presence of atrial fibrillation at a rate of 70 bpm however the patient felt he was still in normal rhythm.  Currently receiving chemotherapy related to multiple myeloma and prostate cancer.  Dr. Earlie Server recommended anticoagulation therapy because of his underlying malignancy as a prophylaxis against DVT and pulmonary emboli.  The patient refused.    Past Medical History:  Diagnosis Date  . BPH (benign prostatic hypertrophy)   . Chronic kidney disease    Hx of bilateral renal cysts.  . Glaucoma   . Heart murmur   . History of atrial fibrillation 03/09/09  . Hx of epiglottitis    2011  . Hypercholesterolemia   . Hypertension   . Mild aortic stenosis   . Prostate cancer ([place]) 7/12   T1C Ad Ca  s/p TURP, observation only  . Sleep apnea   . Smoldering multiple myeloma  ([place])     Past Surgical History:  Procedure Laterality Date  . ADRENALECTOMY  02/12/11   Left, for Pheochromocytoma  . CARDIOVERSION N/A 12/26/2013   Procedure: CARDIOVERSION;  Surgeon: [name], MD;  Location: [facility];  Service: Cardiovascular;  Laterality: N/A;  . CHOLECYSTECTOMY  02/12/11  . GASTRECTOMY  1984   S/P gastrectomy for ulcer  . KNEE SURGERY  1971   S/P Right knee surgery  . TEE WITHOUT CARDIOVERSION N/A 12/26/2013   Procedure: TRANSESOPHAGEAL ECHOCARDIOGRAM (TEE);  Surgeon: [name], MD;  Location: [facility];  Service: Cardiovascular;  Laterality: N/A;  . TRANSURETHRAL RESECTION OF PROSTATE  7/12    Current Medications: Outpatient Medications Prior to Visit  Medication Sig Dispense Refill  . acetaminophen (TYLENOL) 500 MG tablet Take 1,000 mg by mouth at bedtime.    Marland Kitchen acyclovir (ZOVIRAX) 200 MG capsule Take 1 capsule (200 mg total) by mouth 2 (two) times daily. 60 capsule 4  . acyclovir (ZOVIRAX) 400 MG tablet Take 1 tablet (400 mg total) by mouth 2 (two) times daily. 14 tablet 0  . amLODipine (NORVASC) 5 MG tablet Take 1 tablet (5 mg total) by mouth daily. 90 tablet 2  . aspirin 81 MG tablet Take 81 mg by mouth daily.    . brimonidine-timolol (COMBIGAN) 0.2-0.5 % ophthalmic solution Place 1 drop into both eyes 2 (two) times daily.     . Cholecalciferol (VITAMIN D3) 1000 UNITS CAPS Take 2 capsules by mouth daily.     Marland Kitchen  dexamethasone (DECADRON) 4 MG tablet 5 tablets p.o. every week start with the first dose of Velcade. 80 tablet 4  . furosemide (LASIX) 40 MG tablet Take 1 tablet (40 mg total) by mouth daily. 90 tablet 3  . latanoprost (XALATAN) 0.005 % ophthalmic solution INSTILL 1 DROP INTO BOTH EYES EVERY DAY AT NIGHT  6  . lenalidomide (REVLIMID) 10 MG capsule Take 1 capsule (10 mg total) by mouth daily. Take for 21 days on, 7 days off, repeat every 28 days Adult Male    Authorization [id number] 02/20/18 Faxed to [place] 21 capsule 0  .  levothyroxine (SYNTHROID, LEVOTHROID) 50 MCG tablet TAKE 1 TABLET BY MOUTH DAILY BEFORE BREAKFAST. 90 tablet 2  . prochlorperazine (COMPAZINE) 10 MG tablet Take 1 tablet (10 mg total) by mouth every 6 (six) hours as needed for nausea or vomiting. 30 tablet 0  . amiodarone (PACERONE) 200 MG tablet TAKE 1/2 TABLETS BY MOUTH DAILY. 45 tablet 3   No facility-administered medications prior to visit.      Allergies:   Benadryl [diphenhydramine hcl]; Ceftriaxone sodium; Contrast media [iodinated diagnostic agents]; Nsaids; Penicillins; Levaquin [levofloxacin]; and Chlorhexidine   Social History   Socioeconomic History  . Marital status: Married    Spouse name: Not on file  . Number of children: 3  . Years of education: Not on file  . Highest education level: Not on file  Occupational History    Employer: RETIRED  Social Needs  . Financial resource strain: Not on file  . Food insecurity:    Worry: Not on file    Inability: Not on file  . Transportation needs:    Medical: Not on file    Non-medical: Not on file  Tobacco Use  . Smoking status: Never Smoker  . Smokeless tobacco: Never Used  Substance and Sexual Activity  . Alcohol use: No  . Drug use: No  . Sexual activity: Not Currently  Lifestyle  . Physical activity:    Days per week: Not on file    Minutes per session: Not on file  . Stress: Not on file  Relationships  . Social connections:    Talks on phone: Not on file    Gets together: Not on file    Attends religious service: Not on file    Active member of club or organization: Not on file    Attends meetings of clubs or organizations: Not on file    Relationship status: Not on file  Other Topics Concern  . Not on file  Social History Narrative  . Not on file     Family History:  The patient's family history includes Kidney Stones in his brother; Liver disease in his sister, sister, and sister; Melanoma in his brother; Polymyositis in his mother; Prostate cancer in  his brother and father.   ROS:   Please see the history of present illness.    He has been having diarrhea, constipation, alternating nausea and vomiting, skipped heartbeats, irregular heartbeat, excessive fatigue, change in appetite, rash, easy bruising, and poor appetite. All other systems reviewed and are negative.   PHYSICAL EXAM:   VS:  BP 118/74   Pulse 70   Ht _0  (1.676 m)   Wt 164 lb (74.4 kg)   BMI 26.47 kg/m    GEN: Well developed, in no acute distress .  Pale complexion appearing chronically ill HEENT: normal  Neck: no JVD, carotid bruits, or masses Cardiac: IIRR; no murmurs, rubs, or gallops,no  edema  Respiratory:  clear to auscultation bilaterally, normal work of breathing GI: soft, nontender, nondistended, + BS MS: no deformity or atrophy  Skin: warm and dry, no rash Neuro:  Alert and Oriented x 3, Strength and sensation are intact Psych: euthymic mood, full affect  Wt Readings from Last 3 Encounters:  03/02/18 164 lb (74.4 kg)  02/20/18 163 lb 12.8 oz (74.3 kg)  02/06/18 172 lb 8 oz (78.2 kg)      Studies/Labs Reviewed:   EKG:  EKG atrial fibrillation with controlled ventricular response of 70 bpm.  Nonspecific ST abnormality.  Recent Labs: 03/29/2017: TSH 4.500 02/27/2018: ALT 15; BUN 25; Creatinine, Ser 2.02; Hemoglobin 12.9; Platelet Count 89; Potassium 4.2; Sodium 137   Lipid Panel No results found for: CHOL, TRIG, HDL, CHOLHDL, VLDL, LDLCALC, LDLDIRECT  Additional studies/ records that were reviewed today include:  Current therapy for multiple myeloma includes Velcade, Revlimid, and Decadron.    ASSESSMENT:    1. Sinus node dysfunction (HCC)   2. Paroxysmal atrial fibrillation ([place])   3. OSA on CPAP   4. On amiodarone therapy   5. Mild aortic stenosis   6. Chronic diastolic heart failure (HCC)      PLAN:  In order of problems listed above:  1. Currently in atrial fibrillation with controlled ventricular response.  When in sinus  rhythm, amiodarone dose has been decreased to 100 mg daily because of bradycardia.  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 2. Recurrent atrial fibrillation.  Not fully clinically aware if he is in atrial fibrillation.  His usual warning is exertional fatigue.  If he is not very active, he may not recognize change in exertional tolerance.  We discussed the risk of stroke given atrial fibrillation and high CHADS VASC (>3).  Still refuses anticoagulation.  We discussed switching to rate control.  We also discussed using a higher dose of amiodarone hoping for spontaneous conversion and maintenance of sinus rhythm thereafter.  Discussed the risk of excessive bradycardia.  This is the plan that we will go with, hoping to decrease stroke risk by maintaining sinus rhythm with amiodarone which she will take 400 mg/day for 2 weeks and eventually have the dose reduced to 200 mg/day.  Clinical follow-up in 2 weeks with EKG. 3. Not addressed 4. See above, #2. 5. No recent evaluation of aortic valve severity.  No real reason to do it now as he would not be a candidate for any advanced therapies until off chemotherapy and of the clinical problems improve. 6. No evidence of volume overload.  Overall, the patient appears chronically ill, likely related to his current therapy for multiple myeloma.  The current goal is rhythm control with amiodarone.  He prefers no consideration of Watchman, ablation, anticoagulation, electrical conversion, etc.  Medication Adjustments/Labs and Tests Ordered: Current medicines are reviewed at length with the patient today.  Concerns regarding medicines are outlined above.  Medication changes, Labs and Tests ordered today are listed in the Patient Instructions below. Patient Instructions  Medication Instructions:  Take Amiodarone 462m once a day for 2 weeks until your follow up appointment.   Labwork: None Ordered  Testing/Procedures: None Ordered  Follow-Up: Your physician  recommends that you schedule a follow-up appointment in: 2 weeks.    Any Other Special Instructions Will Be Listed Below (If Applicable).     If you need a refill on your cardiac medications before your next appointment, please call your pharmacy.     Signed, HSinclair Grooms MD  03/02/2018 5:16 PM    [facility] [place], [place], [place]  [address] Phone: [phone]; Fax: [phone]

## 2018-03-05 ENCOUNTER — Telehealth: Payer: Self-pay | Admitting: Internal Medicine

## 2018-03-05 ENCOUNTER — Telehealth: Payer: Self-pay | Admitting: Interventional Cardiology

## 2018-03-05 ENCOUNTER — Other Ambulatory Visit: Payer: Self-pay | Admitting: *Deleted

## 2018-03-05 DIAGNOSIS — Z79899 Other long term (current) drug therapy: Secondary | ICD-10-CM

## 2018-03-05 MED ORDER — AMIODARONE HCL 200 MG PO TABS: 400.0000 mg | ORAL_TABLET | Freq: Every day | ORAL | 0 refills | Status: DC

## 2018-03-05 NOTE — Telephone Encounter (Signed)
New message   Prescription sent to the wrong pharmacy    *STAT* If patient is at the pharmacy, call can be transferred to refill team.   1. Which medications need to be refilled? (please list name of each medication and dose if known)  amiodarone (PACERONE) 200 MG tablet Take 2 tablets (400 mg total) by mouth daily. For 2 weeks until follow up         2. Which pharmacy/location (including street and city if local pharmacy) is medication to be sent to? [facility] [place] [facility] [address]   3. Do they need a 30 day or 90 day supply? 2 weeks

## 2018-03-05 NOTE — Telephone Encounter (Signed)
Shifted appt from 8 am to 8:30 due to lab meeting - left message for pt with appt date and time.

## 2018-03-05 NOTE — Telephone Encounter (Signed)
Rx sent to correct pharmacy. I called and spoke with patients wife and made her aware.

## 2018-03-06 ENCOUNTER — Inpatient Hospital Stay: Payer: Medicare Other

## 2018-03-06 ENCOUNTER — Inpatient Hospital Stay: Payer: Medicare Other | Attending: Internal Medicine

## 2018-03-06 ENCOUNTER — Other Ambulatory Visit: Payer: Self-pay | Admitting: Medical

## 2018-03-06 DIAGNOSIS — C9000 Multiple myeloma not having achieved remission: Secondary | ICD-10-CM | POA: Insufficient documentation

## 2018-03-06 DIAGNOSIS — K5900 Constipation, unspecified: Secondary | ICD-10-CM | POA: Diagnosis not present

## 2018-03-06 DIAGNOSIS — Z79899 Other long term (current) drug therapy: Secondary | ICD-10-CM | POA: Diagnosis not present

## 2018-03-06 DIAGNOSIS — Z5112 Encounter for antineoplastic immunotherapy: Secondary | ICD-10-CM | POA: Insufficient documentation

## 2018-03-06 DIAGNOSIS — Z8546 Personal history of malignant neoplasm of prostate: Secondary | ICD-10-CM | POA: Insufficient documentation

## 2018-03-06 DIAGNOSIS — Z7901 Long term (current) use of anticoagulants: Secondary | ICD-10-CM | POA: Diagnosis not present

## 2018-03-06 DIAGNOSIS — I4891 Unspecified atrial fibrillation: Secondary | ICD-10-CM | POA: Insufficient documentation

## 2018-03-06 DIAGNOSIS — R21 Rash and other nonspecific skin eruption: Secondary | ICD-10-CM

## 2018-03-06 LAB — CMP (CANCER CENTER ONLY)
ALT: 10 U/L (ref 0–55)
AST: 16 U/L (ref 5–34)
Albumin: 3.8 g/dL (ref 3.5–5.0)
Alkaline Phosphatase: 75 U/L (ref 40–150)
Anion gap: 8 (ref 3–11)
BUN: 19 mg/dL (ref 7–26)
CO2: 24 mmol/L (ref 22–29)
Calcium: 9.7 mg/dL (ref 8.4–10.4)
Chloride: 105 mmol/L (ref 98–109)
Creatinine: 1.91 mg/dL — ABNORMAL HIGH (ref 0.70–1.30)
GFR, Est AFR Am: 36 mL/min — ABNORMAL LOW (ref >60)
GFR, Estimated: 31 mL/min — ABNORMAL LOW (ref >60)
Glucose, Bld: 103 mg/dL (ref 70–140)
Potassium: 4.2 mmol/L (ref 3.5–5.1)
Sodium: 137 mmol/L (ref 136–145)
Total Bilirubin: 0.9 mg/dL (ref 0.2–1.2)
Total Protein: 5.6 g/dL — ABNORMAL LOW (ref 6.4–8.3)

## 2018-03-06 LAB — CBC WITH DIFFERENTIAL (CANCER CENTER ONLY)
Basophils Absolute: 0 10*3/uL (ref 0.0–0.1)
Basophils Relative: 0 %
Eosinophils Absolute: 0.2 10*3/uL (ref 0.0–0.5)
Eosinophils Relative: 3 %
HCT: 37.4 % — ABNORMAL LOW (ref 38.4–49.9)
Hemoglobin: 12.9 g/dL — ABNORMAL LOW (ref 13.0–17.1)
Lymphocytes Relative: 24 %
Lymphs Abs: 1.1 10*3/uL (ref 0.9–3.3)
MCH: 34.3 pg — ABNORMAL HIGH (ref 27.2–33.4)
MCHC: 34.4 g/dL (ref 32.0–36.0)
MCV: 99.7 fL — ABNORMAL HIGH (ref 79.3–98.0)
Monocytes Absolute: 0.4 10*3/uL (ref 0.1–0.9)
Monocytes Relative: 8 %
Neutro Abs: 2.9 10*3/uL (ref 1.5–6.5)
Neutrophils Relative %: 65 %
Platelet Count: 67 10*3/uL — ABNORMAL LOW (ref 140–400)
RBC: 3.75 MIL/uL — ABNORMAL LOW (ref 4.20–5.82)
RDW: 17 % — ABNORMAL HIGH (ref 11.0–14.6)
WBC Count: 4.5 10*3/uL (ref 4.0–10.3)

## 2018-03-06 MED ORDER — ACYCLOVIR 400 MG PO TABS: 400.0000 mg | ORAL_TABLET | Freq: Three times a day (TID) | ORAL | 0 refills | Status: DC

## 2018-03-06 MED FILL — ACYCLOVIR 400 MG TABLET: 400 | 7 days supply | Qty: 21 | Fill #0

## 2018-03-06 NOTE — Progress Notes (Signed)
Per Dr. Benay Spice, on call physician, do not treat today due to platelet count of 67. Dr. Julien Nordmann is not in the office yet. I informed patient of lab values. He and his wife verbalized understanding.

## 2018-03-08 ENCOUNTER — Other Ambulatory Visit: Payer: Self-pay | Admitting: Medical Oncology

## 2018-03-08 ENCOUNTER — Telehealth: Payer: Self-pay | Admitting: Medical Oncology

## 2018-03-08 DIAGNOSIS — C9000 Multiple myeloma not having achieved remission: Secondary | ICD-10-CM

## 2018-03-08 MED ORDER — LENALIDOMIDE 10 MG PO CAPS: 10.0000 mg | ORAL_CAPSULE | Freq: Every day | ORAL | 0 refills | Status: DC

## 2018-03-08 NOTE — Telephone Encounter (Signed)
I told wife that pt needs to keep appt next week. Pt said he started revlimid last week. He is on schedule.

## 2018-03-08 NOTE — Progress Notes (Signed)
Faxed revlimid to [facility]

## 2018-03-13 ENCOUNTER — Inpatient Hospital Stay: Payer: Medicare Other

## 2018-03-13 ENCOUNTER — Telehealth: Payer: Self-pay | Admitting: Medical Oncology

## 2018-03-13 ENCOUNTER — Other Ambulatory Visit: Payer: Self-pay | Admitting: Medical Oncology

## 2018-03-13 ENCOUNTER — Ambulatory Visit (HOSPITAL_COMMUNITY)
Admission: RE | Admit: 2018-03-13 | Discharge: 2018-03-13 | Disposition: A | Discharge status: Home / Self Care | Payer: Medicare Other | Source: Ambulatory Visit | Attending: Internal Medicine | Admitting: Internal Medicine

## 2018-03-13 VITALS — BP 100/71 | HR 87 | Temp 97.8°F | Resp 18

## 2018-03-13 DIAGNOSIS — I708 Atherosclerosis of other arteries: Secondary | ICD-10-CM | POA: Diagnosis not present

## 2018-03-13 DIAGNOSIS — Z5112 Encounter for antineoplastic immunotherapy: Secondary | ICD-10-CM | POA: Diagnosis not present

## 2018-03-13 DIAGNOSIS — M79604 Pain in right leg: Secondary | ICD-10-CM | POA: Diagnosis not present

## 2018-03-13 DIAGNOSIS — C9000 Multiple myeloma not having achieved remission: Secondary | ICD-10-CM

## 2018-03-13 LAB — CMP (CANCER CENTER ONLY)
ALT: 13 U/L (ref 0–55)
AST: 12 U/L (ref 5–34)
Albumin: 3.6 g/dL (ref 3.5–5.0)
Alkaline Phosphatase: 69 U/L (ref 40–150)
Anion gap: 6 (ref 3–11)
BUN: 18 mg/dL (ref 7–26)
CO2: 26 mmol/L (ref 22–29)
Calcium: 9.6 mg/dL (ref 8.4–10.4)
Chloride: 106 mmol/L (ref 98–109)
Creatinine: 1.82 mg/dL — ABNORMAL HIGH (ref 0.70–1.30)
GFR, Est AFR Am: 38 mL/min — ABNORMAL LOW (ref >60)
GFR, Estimated: 33 mL/min — ABNORMAL LOW (ref >60)
Glucose, Bld: 98 mg/dL (ref 70–140)
Potassium: 4.5 mmol/L (ref 3.5–5.1)
Sodium: 138 mmol/L (ref 136–145)
Total Bilirubin: 0.9 mg/dL (ref 0.2–1.2)
Total Protein: 5.2 g/dL — ABNORMAL LOW (ref 6.4–8.3)

## 2018-03-13 LAB — CBC WITH DIFFERENTIAL (CANCER CENTER ONLY)
Basophils Absolute: 0 10*3/uL (ref 0.0–0.1)
Basophils Relative: 0 %
Eosinophils Absolute: 0.3 10*3/uL (ref 0.0–0.5)
Eosinophils Relative: 7 %
HCT: 37.3 % — ABNORMAL LOW (ref 38.4–49.9)
Hemoglobin: 12.5 g/dL — ABNORMAL LOW (ref 13.0–17.1)
Lymphocytes Relative: 24 %
Lymphs Abs: 0.9 10*3/uL (ref 0.9–3.3)
MCH: 34.2 pg — ABNORMAL HIGH (ref 27.2–33.4)
MCHC: 33.5 g/dL (ref 32.0–36.0)
MCV: 101.9 fL — ABNORMAL HIGH (ref 79.3–98.0)
Monocytes Absolute: 0.7 10*3/uL (ref 0.1–0.9)
Monocytes Relative: 18 %
Neutro Abs: 1.9 10*3/uL (ref 1.5–6.5)
Neutrophils Relative %: 51 %
Platelet Count: 86 10*3/uL — ABNORMAL LOW (ref 140–400)
RBC: 3.66 MIL/uL — ABNORMAL LOW (ref 4.20–5.82)
RDW: 15.8 % — ABNORMAL HIGH (ref 11.0–14.6)
WBC Count: 3.8 10*3/uL — ABNORMAL LOW (ref 4.0–10.3)

## 2018-03-13 MED ORDER — BORTEZOMIB CHEMO SQ INJECTION 3.5 MG (2.5MG/ML)
1.3000 mg/m2 | Freq: Once | INTRAMUSCULAR | Status: AC
  Administered 2018-03-13: 2.5 mg via SUBCUTANEOUS
  Filled 2018-03-13: qty 1

## 2018-03-13 MED ORDER — PROCHLORPERAZINE MALEATE 10 MG PO TABS
10.0000 mg | ORAL_TABLET | Freq: Once | ORAL | Status: AC
  Administered 2018-03-13: 10 mg via ORAL

## 2018-03-13 MED ORDER — PROCHLORPERAZINE MALEATE 10 MG PO TABS
ORAL_TABLET | ORAL | Status: AC
  Filled 2018-03-13: qty 1

## 2018-03-13 NOTE — Patient Instructions (Signed)
[facility] Discharge Instructions for Patients Receiving Chemotherapy  Today you received the following chemotherapy agents Velcade.  To help prevent nausea and vomiting after your treatment, we encourage you to take your nausea medication as directed.  If you develop nausea and vomiting that is not controlled by your nausea medication, call the clinic.   BELOW ARE SYMPTOMS THAT SHOULD BE REPORTED IMMEDIATELY:  *FEVER GREATER THAN 100.5 F  *CHILLS WITH OR WITHOUT FEVER  NAUSEA AND VOMITING THAT IS NOT CONTROLLED WITH YOUR NAUSEA MEDICATION  *UNUSUAL SHORTNESS OF BREATH  *UNUSUAL BRUISING OR BLEEDING  TENDERNESS IN MOUTH AND THROAT WITH OR WITHOUT PRESENCE OF ULCERS  *URINARY PROBLEMS  *BOWEL PROBLEMS  UNUSUAL RASH Items with * indicate a potential emergency and should be followed up as soon as possible.  Feel free to call the clinic should you have any questions or concerns. The clinic phone number is [phone].  Please show the CHEMO ALERT CARD at check-in to the Emergency Department and triage nurse.   

## 2018-03-13 NOTE — Progress Notes (Signed)
RLe venous duplex prelim: negative for DVT and SVT. Landry Mellow, RDMS, RVT   Attempted call report to Dr. Earlie Server. Left message on nurse line with results.

## 2018-03-13 NOTE — Telephone Encounter (Signed)
I told wife [place] coppler was negative and for pt to use heat on his leg .

## 2018-03-13 NOTE — Progress Notes (Signed)
Ok to treat with lab work today per MD [employer]

## 2018-03-18 NOTE — Progress Notes (Signed)
Cardiology Office Note    Date:  03/19/2018   ID:  Thomas, [name] [birth year], MRN [id number]  PCP:  Thomas Stains, MD  Cardiologist: Thomas Grooms, MD   Chief Complaint  Patient presents with  . Atrial Fibrillation    History of Present Illness:  Thomas [name] is a 83 y.o. male paroxysmal atrial fibrillation, chronic diastolic heart failure, hypertension, chronic kidney disease, unable to use anticoagulation therapy due to recurrent GU bleeding, and mild aortic stenosis.Other medical issues include metastatic prostate CA (Lupron) and indolent multiple myeloma.  Recent recurrent atrial fibrillation.  Has been in atrial fibrillation for several weeks.  The dose of amiodarone hoping that if he was having PAF, after reloading, rhythm would stick in sinus.  This is not occurred to this point.  He continues to feel fatigued.  Has been reluctant to use anticoagulation therapy because of prior bleeding on Coumadin.  His daughter rather than wife is here today.  We discussed treatment options for atrial fibrillation and stroke prevention: Rhythm control and chronic anticoagulation with NOAC vs. rhythm control on higher dose amiodarone after electrical cardioversion but with anticoagulation for at least 3 weeks prior to conversion.  After some discussion the patient feels better in sinus rhythm and would like to consider electrical conversion.  We discussed the long-term implications and that even a higher dose of amiodarone and is not sure to hold the heart and rhythm.  He will need to be on chronic anticoagulation therapy if at all possible with monitoring for bleeding.   Past Medical History:  Diagnosis Date  . BPH (benign prostatic hypertrophy)   . Chronic kidney disease    Hx of bilateral renal cysts.  . Glaucoma   . Heart murmur   . History of atrial fibrillation 03/09/09  . Hx of epiglottitis    2011  . Hypercholesterolemia   . Hypertension   . Mild aortic stenosis     . Prostate cancer ([place]) 7/12   T1C Ad Ca  s/p TURP, observation only  . Sleep apnea   . Smoldering multiple myeloma ([place])     Past Surgical History:  Procedure Laterality Date  . ADRENALECTOMY  02/12/11   Left, for Pheochromocytoma  . CARDIOVERSION N/A 12/26/2013   Procedure: CARDIOVERSION;  Surgeon: [name], MD;  Location: [facility];  Service: Cardiovascular;  Laterality: N/A;  . CHOLECYSTECTOMY  02/12/11  . GASTRECTOMY  1984   S/P gastrectomy for ulcer  . KNEE SURGERY  1971   S/P Right knee surgery  . TEE WITHOUT CARDIOVERSION N/A 12/26/2013   Procedure: TRANSESOPHAGEAL ECHOCARDIOGRAM (TEE);  Surgeon: [name], MD;  Location: [facility];  Service: Cardiovascular;  Laterality: N/A;  . TRANSURETHRAL RESECTION OF PROSTATE  7/12    Current Medications: Outpatient Medications Prior to Visit  Medication Sig Dispense Refill  . acetaminophen (TYLENOL) 500 MG tablet Take 1,000 mg by mouth at bedtime.    Marland Kitchen acyclovir (ZOVIRAX) 200 MG capsule Take 1 capsule (200 mg total) by mouth 2 (two) times daily. 60 capsule 4  . amiodarone (PACERONE) 200 MG tablet Take 2 tablets (400 mg total) by mouth daily. For 2 weeks until follow up 30 tablet 0  . amLODipine (NORVASC) 5 MG tablet Take 1 tablet (5 mg total) by mouth daily. 90 tablet 2  . brimonidine-timolol (COMBIGAN) 0.2-0.5 % ophthalmic solution Place 1 drop into both eyes 2 (two) times daily.     . Cholecalciferol (VITAMIN D3) 1000 UNITS CAPS  Take 2 capsules by mouth daily.     Marland Kitchen dexamethasone (DECADRON) 4 MG tablet 5 tablets p.o. every week start with the first dose of Velcade. 80 tablet 4  . furosemide (LASIX) 40 MG tablet Take 1 tablet (40 mg total) by mouth daily. 90 tablet 3  . latanoprost (XALATAN) 0.005 % ophthalmic solution INSTILL 1 DROP INTO BOTH EYES EVERY DAY AT NIGHT  6  . lenalidomide (REVLIMID) 10 MG capsule Take 1 capsule (10 mg total) by mouth daily. Take for 21 days on, 7 days off, repeat every 17 days-Adult  male Adult Male    Authorization # [id number] 21 capsule 0  . levothyroxine (SYNTHROID, LEVOTHROID) 50 MCG tablet TAKE 1 TABLET BY MOUTH DAILY BEFORE BREAKFAST. 90 tablet 2  . prochlorperazine (COMPAZINE) 10 MG tablet Take 1 tablet (10 mg total) by mouth every 6 (six) hours as needed for nausea or vomiting. 30 tablet 0  . aspirin 81 MG tablet Take 81 mg by mouth daily.    Marland Kitchen acyclovir (ZOVIRAX) 400 MG tablet Take 1 tablet (400 mg total) by mouth 3 (three) times daily. (Patient not taking: Reported on 03/19/2018) 21 tablet 0   No facility-administered medications prior to visit.      Allergies:   Benadryl [diphenhydramine hcl]; Ceftriaxone sodium; Contrast media [iodinated diagnostic agents]; Nsaids; Penicillins; Levaquin [levofloxacin]; and Chlorhexidine   Social History   Socioeconomic History  . Marital status: Married    Spouse name: Not on file  . Number of children: 3  . Years of education: Not on file  . Highest education level: Not on file  Occupational History    Employer: RETIRED  Social Needs  . Financial resource strain: Not on file  . Food insecurity:    Worry: Not on file    Inability: Not on file  . Transportation needs:    Medical: Not on file    Non-medical: Not on file  Tobacco Use  . Smoking status: Never Smoker  . Smokeless tobacco: Never Used  Substance and Sexual Activity  . Alcohol use: No  . Drug use: No  . Sexual activity: Not Currently  Lifestyle  . Physical activity:    Days per week: Not on file    Minutes per session: Not on file  . Stress: Not on file  Relationships  . Social connections:    Talks on phone: Not on file    Gets together: Not on file    Attends religious service: Not on file    Active member of club or organization: Not on file    Attends meetings of clubs or organizations: Not on file    Relationship status: Not on file  Other Topics Concern  . Not on file  Social History Narrative  . Not on file     Family  History:  The patient's family history includes Kidney Stones in his brother; Liver disease in his sister, sister, and sister; Melanoma in his brother; Polymyositis in his mother; Prostate cancer in his brother and father.   ROS:   Please see the history of present illness.    Fatigue. All other systems reviewed and are negative.   PHYSICAL EXAM:   VS:  BP 106/62 (BP Location: Right Arm, Patient Position: Sitting, Cuff Size: Normal)   Pulse (!) 106   Ht 5' 6"  (1.676 m)   Wt 157 lb 1.9 oz (71.3 kg)   SpO2 97%   BMI 25.36 kg/m    GEN: Well nourished, well developed,  in no acute distress  HEENT: normal  Neck: no JVD, carotid bruits, or masses Cardiac: IIRR; no murmurs, rubs, or gallops,no edema  Respiratory:  clear to auscultation bilaterally, normal work of breathing GI: soft, nontender, nondistended, + BS MS: no deformity or atrophy  Skin: warm and dry, no rash Neuro:  Alert and Oriented x 3, Strength and sensation are intact Psych: euthymic mood, full affect1  Wt Readings from Last 3 Encounters:  03/19/18 157 lb 1.9 oz (71.3 kg)  03/02/18 164 lb (74.4 kg)  02/20/18 163 lb 12.8 oz (74.3 kg)      Studies/Labs Reviewed:   EKG:  EKG not repeated  Recent Labs: 03/29/2017: TSH 4.500 03/13/2018: ALT 13; BUN 18; Creatinine 1.82; Hemoglobin 12.5; Platelet Count 86; Potassium 4.5; Sodium 138   Lipid Panel No results found for: CHOL, TRIG, HDL, CHOLHDL, VLDL, LDLCALC, LDLDIRECT  Additional studies/ records that were reviewed today include:  None    ASSESSMENT:    1. On amiodarone therapy   2. Paroxysmal atrial fibrillation (HCC)   3. Sinus node dysfunction (HCC)   4. Stage 1 chronic kidney disease   5. Chronic diastolic heart failure ([place])   6. Essential hypertension      PLAN:  In order of problems listed above:  1. Continue amiodarone 200 mg twice daily.  May decrease dose to 200 mg/day and 2 to 3 weeks. 2. Start Eliquis 2.5 mg twice daily.  Creatinine will be  done on return.  Plan for electrical cardioversion after 3 weeks of anticoagulation.  May ultimately require pacemaker if bradycardic.  Will need to speak to the patient about this on return visit in 3 weeks.  Stop aspirin.  Will notify Dr. Julien Nordmann.  Plan electrical cardioversion. 3. Watch for evidence of excessive bradycardia at cardioversion.  Will need to hold any beta-blocker therapy prior to cardioversion (not currently on beta-blocker therapy) 4. Stage III chronic kidney disease requires an Eliquis dose of 2.5 mg twice daily 5. No obvious evidence of volume overload 6. Adequate blood pressure control.  Plan for elective electrical cardioversion.  Return to office in 3 weeks with EKG.  Amiodarone at that time should be decreased to 200 mg daily.  Would not add beta-blocker therapy for fear of excessive bradycardia at cardioversion.  Will need to counsel patient about the possible need for pacemaker therapy in the future if excessive bradycardia on amiodarone.  Is to increase bleeding risk since he has had difficulty with bleeding in the past on Coumadin.  Educated that bleeding risk on the newer X a inhibitors is lower than with Coumadin.      Medication Adjustments/Labs and Tests Ordered: Current medicines are reviewed at length with the patient today.  Concerns regarding medicines are outlined above.  Medication changes, Labs and Tests ordered today are listed in the Patient Instructions below. Patient Instructions  Medication Instructions:  1) START Eliquis 2.59m twice daily 2) DISCONTINUE Aspirin 3) Contact your insurance company in regards to anticoagulant medication.  Inquire about which medication is most affordable for you.  These are the medications to inquire about: -Eliquis -Xarelto -Warfarin -Pradaxa  Labwork: None  Testing/Procedures: None  Follow-Up: Your physician recommends that you schedule a follow-up appointment in: 3 weeks with a PA or NP with an EKG and BMET.     Any Other Special Instructions Will Be Listed Below (If Applicable).     If you need a refill on your cardiac medications before your next appointment, please call your pharmacy.  Signed, Thomas Grooms, MD  03/19/2018 1:07 PM    [facility] [place], [place], [place]  [address] Phone: [phone]; Fax: [phone]

## 2018-03-19 ENCOUNTER — Ambulatory Visit: Payer: Medicare Other | Admitting: Interventional Cardiology

## 2018-03-19 ENCOUNTER — Encounter: Payer: Self-pay | Admitting: Interventional Cardiology

## 2018-03-19 VITALS — BP 106/62 | HR 106 | Ht 66.0 in | Wt 157.1 lb

## 2018-03-19 DIAGNOSIS — Z79899 Other long term (current) drug therapy: Secondary | ICD-10-CM | POA: Diagnosis not present

## 2018-03-19 DIAGNOSIS — I495 Sick sinus syndrome: Secondary | ICD-10-CM | POA: Diagnosis not present

## 2018-03-19 DIAGNOSIS — I5032 Chronic diastolic (congestive) heart failure: Secondary | ICD-10-CM | POA: Diagnosis not present

## 2018-03-19 DIAGNOSIS — I10 Essential (primary) hypertension: Secondary | ICD-10-CM | POA: Diagnosis not present

## 2018-03-19 DIAGNOSIS — N181 Chronic kidney disease, stage 1: Secondary | ICD-10-CM | POA: Diagnosis not present

## 2018-03-19 DIAGNOSIS — I480 Paroxysmal atrial fibrillation: Secondary | ICD-10-CM | POA: Diagnosis not present

## 2018-03-19 MED ORDER — APIXABAN 2.5 MG PO TABS: 2.5000 mg | ORAL_TABLET | Freq: Two times a day (BID) | ORAL | 11 refills | Status: DC

## 2018-03-19 MED FILL — ACYCLOVIR 200 MG CAP: 200 | 30 days supply | Qty: 60 | Fill #3

## 2018-03-19 NOTE — Assessment & Plan Note (Addendum)
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 The patient has been on observation since 2011. His recent myeloma panel showed further increase in the free kappa light chain. His recent skeletal bone survey showed no concerning findings for disease progression. He has a focus of avascular necrosis but no significant lytic lesions. The recent bone marrow biopsy and aspirate showed further increase in the plasma cells to 15%. The patient is currently undergoing treatment with subcutaneous weekly Velcade as well as Revlimid and Decadron and been tolerating this treatment fairly well. Labs reviewed.  Creatinine has improved to 1.83 today. Platelet count 45,000 today.  Labs reviewed with Dr. Julien Nordmann.  Unfortunately, platelet count is too low to proceed with treatment today.  We will hold this dose of treatment.  The patient will continue to return weekly for labs and treatment.  We will monitor his CBC and renal function closely.  Bleeding precautions were discussed.  For constipation, I recommend that he take magnesium citrate one half of the bottle today.  If he has no results he is to take the remainder of the bottle tomorrow.  He is also to begin a bowel regimen consisting of Colace twice a day along with MiraLAX 1 capful mixed in water or juice daily.  For his atrial fibrillation he will continue follow-up with cardiology.   The patient was advised to call immediately if he has any concerning symptoms in the interval. The patient voices understanding of current disease status and treatment options and is in agreement with the current care plan. All questions were answered. The patient knows to call the clinic with any problems, questions or concerns. We can certainly see the patient much sooner if necessary.

## 2018-03-19 NOTE — Patient Instructions (Signed)
Medication Instructions:  1) START Eliquis 2.5mg  twice daily 2) DISCONTINUE Aspirin 3) Contact your insurance company in regards to anticoagulant medication.  Inquire about which medication is most affordable for you.  These are the medications to inquire about: -Eliquis -Xarelto -Warfarin -Pradaxa  Labwork: None  Testing/Procedures: None  Follow-Up: Your physician recommends that you schedule a follow-up appointment in: 3 weeks with a PA or NP with an EKG and BMET.    Any Other Special Instructions Will Be Listed Below (If Applicable).     If you need a refill on your cardiac medications before your next appointment, please call your pharmacy.

## 2018-03-19 NOTE — Progress Notes (Signed)
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 2) History of pheochromocytoma LEFT adrenal gland status post resection on 03/16/2009 3) Prostate adenocarcinoma  PRIOR THERAPY: None  CURRENT THERAPY: Systemic therapy with weekly Velcade 1.3 mg/KG subcutaneously, Revlimid 25 mg p.o. daily for 21 days every 4 weeks as well as Decadron 20 mg p.o. weekly. First dose November 07, 2017.Status post 5 cycles.  INTERVAL HISTORY: Thomas [name] 83 y.o. male returns for a routine follow-up visit accompanied by his wife and daughter.  The patient is feeling fine today and has no specific complaints except for constipation.  The patient reports taking 2 doses of Dulcolax and a dose of MiraLAX this past weekend with no results.  His last bowel was about 7 days ago.  He denies fevers and chills.  Denies chest pain, shortness of breath, cough, hemoptysis.  Denies nausea, vomiting, diarrhea.  Denies bleeding.  Patient was seen by cardiology yesterday started on Eliquis.  He reports that he has a follow-up with them in early July to discuss possible cardioversion.  The patient is here for evaluation prior to starting his next cycle of chemotherapy.  MEDICAL HISTORY: Past Medical History:  Diagnosis Date  . BPH (benign prostatic hypertrophy)   . Chronic kidney disease    Hx of bilateral renal cysts.  . Glaucoma   . Heart murmur   . History of atrial fibrillation 03/09/09  . Hx of epiglottitis    2011  . Hypercholesterolemia   . Hypertension   . Mild aortic stenosis   . Prostate cancer ([facility]) 7/12   T1C Ad Ca  s/p TURP, observation only  . Sleep apnea   . Smoldering multiple myeloma ([facility])     ALLERGIES:  is allergic to benadryl [diphenhydramine hcl]; ceftriaxone sodium; contrast media [iodinated  diagnostic agents]; nsaids; penicillins; levaquin [levofloxacin]; and chlorhexidine.  MEDICATIONS:  Current Outpatient Medications  Medication Sig Dispense Refill  . acetaminophen (TYLENOL) 500 MG tablet Take 1,000 mg by mouth at bedtime.    Marland Kitchen acyclovir (ZOVIRAX) 200 MG capsule Take 1 capsule (200 mg total) by mouth 2 (two) times daily. 60 capsule 4  . amiodarone (PACERONE) 200 MG tablet Take 2 tablets (400 mg total) by mouth daily. For 2 weeks until follow up 30 tablet 0  . amLODipine (NORVASC) 5 MG tablet Take 1 tablet (5 mg total) by mouth daily. 90 tablet 2  . apixaban (ELIQUIS) 2.5 MG TABS tablet Take 1 tablet (2.5 mg total) by mouth 2 (two) times daily. 60 tablet 11  . brimonidine-timolol (COMBIGAN) 0.2-0.5 % ophthalmic solution Place 1 drop into both eyes 2 (two) times daily.     . Cholecalciferol (VITAMIN D3) 1000 UNITS CAPS Take 2 capsules by mouth daily.     Marland Kitchen dexamethasone (DECADRON) 4 MG tablet 5 tablets p.o. every week start with the first dose of Velcade. 80 tablet 4  . furosemide (LASIX) 40 MG tablet Take 1 tablet (40 mg total) by mouth daily. 90 tablet 3  . latanoprost (XALATAN) 0.005 % ophthalmic solution INSTILL 1 DROP INTO BOTH EYES EVERY DAY AT NIGHT  6  . lenalidomide (REVLIMID) 10 MG capsule Take 1 capsule (10 mg total) by mouth daily. Take for 21 days on, 7 days off, repeat every 78 days-Adult male Adult Male    Authorization #  [id number] 21 capsule 0  . levothyroxine (SYNTHROID, LEVOTHROID) 50 MCG tablet TAKE 1 TABLET BY MOUTH DAILY BEFORE BREAKFAST. 90 tablet 2  . prochlorperazine (COMPAZINE) 10 MG tablet Take 1 tablet (10 mg total) by mouth every 6 (six) hours as needed for nausea or vomiting. 30 tablet 0   No current facility-administered medications for this visit.     SURGICAL HISTORY:  Past Surgical History:  Procedure Laterality Date  . ADRENALECTOMY  02/12/11   Left, for Pheochromocytoma  . CARDIOVERSION N/A 12/26/2013   Procedure: CARDIOVERSION;   Surgeon: [name], MD;  Location: [facility];  Service: Cardiovascular;  Laterality: N/A;  . CHOLECYSTECTOMY  02/12/11  . GASTRECTOMY  1984   S/P gastrectomy for ulcer  . KNEE SURGERY  1971   S/P Right knee surgery  . TEE WITHOUT CARDIOVERSION N/A 12/26/2013   Procedure: TRANSESOPHAGEAL ECHOCARDIOGRAM (TEE);  Surgeon: [name], MD;  Location: [facility];  Service: Cardiovascular;  Laterality: N/A;  . TRANSURETHRAL RESECTION OF PROSTATE  7/12    REVIEW OF SYSTEMS:   Review of Systems  Constitutional: Negative for appetite change, chills,  fever and unexpected weight change. Positive for fatigue. HENT:   Negative for mouth sores, nosebleeds, sore throat and trouble swallowing.   Eyes: Negative for eye problems and icterus.  Respiratory: Negative for cough, hemoptysis, shortness of breath and wheezing.   Cardiovascular: Negative for chest pain and leg swelling.  Gastrointestinal: Negative for abdominal pain, diarrhea, nausea and vomiting. Positive for constipation. Genitourinary: Negative for bladder incontinence, difficulty urinating, dysuria, frequency and hematuria.   Musculoskeletal: Negative for back pain, gait problem, neck pain and neck stiffness.  Skin: Negative for itching and rash.  Neurological: Negative for dizziness, extremity weakness, gait problem, headaches, light-headedness and seizures.  Hematological: Negative for adenopathy. Does not bruise/bleed easily.  Psychiatric/Behavioral: Negative for confusion, depression and sleep disturbance. The patient is not nervous/anxious.     PHYSICAL EXAMINATION:  Blood pressure 106/63, pulse 66, temperature (!) 97.5 F (36.4 C), temperature source Oral, resp. rate 17, height 5' 6"  (1.676 m), weight 157 lb 12.8 oz (71.6 kg), SpO2 99 %.  ECOG PERFORMANCE STATUS: 1 - Symptomatic but completely ambulatory  Physical Exam  Constitutional: Oriented to person, place, and time and well-developed, well-nourished, and in no  distress. No distress.  HENT:  Head: Normocephalic and atraumatic.  Mouth/Throat: Oropharynx is clear and moist. No oropharyngeal exudate.  Eyes: Conjunctivae are normal. Right eye exhibits no discharge. Left eye exhibits no discharge. No scleral icterus.  Neck: Normal range of motion. Neck supple.  Cardiovascular: Normal rate.  Heart rate regular. Normal heart sounds and intact distal pulses.   Pulmonary/Chest: Effort normal and breath sounds normal. No respiratory distress. No wheezes. No rales.  Abdominal: Soft. Bowel sounds are normal. Exhibits no distension and no mass. There is no tenderness.  Musculoskeletal: Normal range of motion. Exhibits no edema.  Lymphadenopathy:    No cervical adenopathy.  Neurological: Alert and oriented to person, place, and time. Exhibits normal muscle tone. Gait normal. Coordination normal.  Skin: Skin is warm and dry. No rash noted. Not diaphoretic. No erythema. No pallor.  Psychiatric: Mood, memory and judgment normal.  Vitals reviewed.  LABORATORY DATA: Lab Results  Component Value Date   WBC 2.9 (L) 03/20/2018   HGB 12.6 (L) 03/20/2018   HCT 37.2 (L) 03/20/2018   MCV 100.9 (H) 03/20/2018   PLT 45 (L) 03/20/2018      Chemistry      Component Value  Date/Time   NA 138 03/20/2018 1023   NA 138 08/16/2017 1404   K 3.9 03/20/2018 1023   K 4.5 08/16/2017 1404   CL 106 03/20/2018 1023   CL 109 (H) 03/21/2013 0958   CO2 26 03/20/2018 1023   CO2 24 08/16/2017 1404   BUN 21 03/20/2018 1023   BUN 17.3 08/16/2017 1404   CREATININE 1.83 (H) 03/20/2018 1023   CREATININE 1.8 (H) 08/16/2017 1404      Component Value Date/Time   CALCIUM 9.6 03/20/2018 1023   CALCIUM 9.9 08/16/2017 1404   ALKPHOS 69 03/20/2018 1023   ALKPHOS 86 08/16/2017 1404   AST 13 03/20/2018 1023   AST 22 08/16/2017 1404   ALT 12 03/20/2018 1023   ALT 16 08/16/2017 1404   BILITOT 1.0 03/20/2018 1023   BILITOT 0.78 08/16/2017 1404       RADIOGRAPHIC STUDIES:  No  results found.   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 The patient has been on observation since 2011. His recent myeloma panel showed further increase in the free kappa light chain. His recent skeletal bone survey showed no concerning findings for disease progression. He has a focus of avascular necrosis but no significant lytic lesions. The recent bone marrow biopsy and aspirate showed further increase in the plasma cells to 15%. The patient is currently undergoing treatment with subcutaneous weekly Velcade as well as Revlimid and Decadron and been tolerating this treatment fairly well. Labs reviewed.  Creatinine  has improved to 1.83 today. Platelet count 45,000 today.  Labs reviewed with Dr. Julien Nordmann.  Unfortunately, platelet count is too low to proceed with treatment today.  We will hold this dose of treatment.  The patient will continue to return weekly for labs and treatment.  We will monitor his CBC and renal function closely.  Bleeding precautions were discussed.  For constipation, I recommend that he take magnesium citrate one half of the bottle today.  If he has no results he is to take the remainder of the bottle tomorrow.  He is also to begin a bowel regimen consisting of Colace twice a day along with MiraLAX 1 capful mixed in water or juice daily.  For his atrial fibrillation he will continue follow-up with cardiology.   The patient was advised to call immediately if he has any concerning symptoms in the interval. The patient voices understanding of current disease status and treatment options and is in agreement with the current care plan. All questions were answered. The patient knows to call the clinic with any problems, questions or concerns. We can certainly see the patient much sooner if necessary.   No orders of the defined types were placed in  this encounter.  Mikey Bussing, DNP, AGPCNP-BC, AOCNP 03/20/18

## 2018-03-20 ENCOUNTER — Other Ambulatory Visit: Payer: Self-pay

## 2018-03-20 ENCOUNTER — Inpatient Hospital Stay: Payer: Medicare Other

## 2018-03-20 ENCOUNTER — Inpatient Hospital Stay (HOSPITAL_BASED_OUTPATIENT_CLINIC_OR_DEPARTMENT_OTHER): Payer: Medicare Other | Admitting: Oncology

## 2018-03-20 ENCOUNTER — Encounter: Payer: Self-pay | Admitting: Oncology

## 2018-03-20 VITALS — BP 106/63 | HR 66 | Temp 97.5°F | Resp 17 | Ht 66.0 in | Wt 157.8 lb

## 2018-03-20 DIAGNOSIS — I4891 Unspecified atrial fibrillation: Secondary | ICD-10-CM | POA: Diagnosis not present

## 2018-03-20 DIAGNOSIS — Z5111 Encounter for antineoplastic chemotherapy: Secondary | ICD-10-CM

## 2018-03-20 DIAGNOSIS — Z8546 Personal history of malignant neoplasm of prostate: Secondary | ICD-10-CM | POA: Diagnosis not present

## 2018-03-20 DIAGNOSIS — N181 Chronic kidney disease, stage 1: Secondary | ICD-10-CM

## 2018-03-20 DIAGNOSIS — K5900 Constipation, unspecified: Secondary | ICD-10-CM | POA: Diagnosis not present

## 2018-03-20 DIAGNOSIS — C9000 Multiple myeloma not having achieved remission: Secondary | ICD-10-CM

## 2018-03-20 DIAGNOSIS — Z7901 Long term (current) use of anticoagulants: Secondary | ICD-10-CM | POA: Diagnosis not present

## 2018-03-20 DIAGNOSIS — D696 Thrombocytopenia, unspecified: Secondary | ICD-10-CM

## 2018-03-20 DIAGNOSIS — Z79899 Other long term (current) drug therapy: Secondary | ICD-10-CM

## 2018-03-20 DIAGNOSIS — Z5112 Encounter for antineoplastic immunotherapy: Secondary | ICD-10-CM | POA: Diagnosis not present

## 2018-03-20 LAB — CMP (CANCER CENTER ONLY)
ALT: 12 U/L (ref 0–55)
AST: 13 U/L (ref 5–34)
Albumin: 3.6 g/dL (ref 3.5–5.0)
Alkaline Phosphatase: 69 U/L (ref 40–150)
Anion gap: 6 (ref 3–11)
BUN: 21 mg/dL (ref 7–26)
CO2: 26 mmol/L (ref 22–29)
Calcium: 9.6 mg/dL (ref 8.4–10.4)
Chloride: 106 mmol/L (ref 98–109)
Creatinine: 1.83 mg/dL — ABNORMAL HIGH (ref 0.70–1.30)
GFR, Est AFR Am: 38 mL/min — ABNORMAL LOW (ref >60)
GFR, Estimated: 33 mL/min — ABNORMAL LOW (ref >60)
Glucose, Bld: 98 mg/dL (ref 70–140)
Potassium: 3.9 mmol/L (ref 3.5–5.1)
Sodium: 138 mmol/L (ref 136–145)
Total Bilirubin: 1 mg/dL (ref 0.2–1.2)
Total Protein: 5.2 g/dL — ABNORMAL LOW (ref 6.4–8.3)

## 2018-03-20 LAB — CBC WITH DIFFERENTIAL (CANCER CENTER ONLY)
Basophils Absolute: 0 10*3/uL (ref 0.0–0.1)
Basophils Relative: 0 %
Eosinophils Absolute: 0.2 10*3/uL (ref 0.0–0.5)
Eosinophils Relative: 7 %
HCT: 37.2 % — ABNORMAL LOW (ref 38.4–49.9)
Hemoglobin: 12.6 g/dL — ABNORMAL LOW (ref 13.0–17.1)
Lymphocytes Relative: 20 %
Lymphs Abs: 0.6 10*3/uL — ABNORMAL LOW (ref 0.9–3.3)
MCH: 34.1 pg — ABNORMAL HIGH (ref 27.2–33.4)
MCHC: 33.8 g/dL (ref 32.0–36.0)
MCV: 100.9 fL — ABNORMAL HIGH (ref 79.3–98.0)
Monocytes Absolute: 0.5 10*3/uL (ref 0.1–0.9)
Monocytes Relative: 16 %
Neutro Abs: 1.6 10*3/uL (ref 1.5–6.5)
Neutrophils Relative %: 57 %
Platelet Count: 45 10*3/uL — ABNORMAL LOW (ref 140–400)
RBC: 3.68 MIL/uL — ABNORMAL LOW (ref 4.20–5.82)
RDW: 16.3 % — ABNORMAL HIGH (ref 11.0–14.6)
WBC Count: 2.9 10*3/uL — ABNORMAL LOW (ref 4.0–10.3)

## 2018-03-20 NOTE — Patient Instructions (Signed)
For constipation: Today take one half a bottle of magnesium citrate.  If no bowel movement by tomorrow, take the other half of the bottle.  Begin Colace (docusate sodium) 1 tablet twice a day along with MiraLAX 1 capful mixed in water or juice daily. Stop taking this if diarrhea develops.  You may restart it once diarrhea resolves.  I would recommend restarting with 1 tablet of Colace daily and one half capful of MiraLAX daily.

## 2018-03-21 ENCOUNTER — Telehealth: Payer: Self-pay | Admitting: Internal Medicine

## 2018-03-21 ENCOUNTER — Ambulatory Visit: Payer: Medicare Other | Admitting: Physician Assistant

## 2018-03-21 NOTE — Telephone Encounter (Signed)
Scheduled appt per 6/18 los - pt to get an updated schedule next visit. 

## 2018-03-27 ENCOUNTER — Inpatient Hospital Stay: Payer: Medicare Other

## 2018-03-27 VITALS — BP 106/68 | HR 55 | Temp 97.5°F | Resp 17

## 2018-03-27 DIAGNOSIS — C9000 Multiple myeloma not having achieved remission: Secondary | ICD-10-CM

## 2018-03-27 DIAGNOSIS — Z5112 Encounter for antineoplastic immunotherapy: Secondary | ICD-10-CM | POA: Diagnosis not present

## 2018-03-27 LAB — CBC WITH DIFFERENTIAL (CANCER CENTER ONLY)
Basophils Absolute: 0 10*3/uL (ref 0.0–0.1)
Basophils Relative: 1 %
Eosinophils Absolute: 0.1 10*3/uL (ref 0.0–0.5)
Eosinophils Relative: 2 %
HCT: 37.8 % — ABNORMAL LOW (ref 38.4–49.9)
Hemoglobin: 12.8 g/dL — ABNORMAL LOW (ref 13.0–17.1)
Lymphocytes Relative: 23 %
Lymphs Abs: 0.8 10*3/uL — ABNORMAL LOW (ref 0.9–3.3)
MCH: 34.4 pg — ABNORMAL HIGH (ref 27.2–33.4)
MCHC: 33.8 g/dL (ref 32.0–36.0)
MCV: 101.9 fL — ABNORMAL HIGH (ref 79.3–98.0)
Monocytes Absolute: 0.7 10*3/uL (ref 0.1–0.9)
Monocytes Relative: 19 %
Neutro Abs: 2 10*3/uL (ref 1.5–6.5)
Neutrophils Relative %: 55 %
Platelet Count: 112 10*3/uL — ABNORMAL LOW (ref 140–400)
RBC: 3.71 MIL/uL — ABNORMAL LOW (ref 4.20–5.82)
RDW: 16.7 % — ABNORMAL HIGH (ref 11.0–14.6)
WBC Count: 3.5 10*3/uL — ABNORMAL LOW (ref 4.0–10.3)

## 2018-03-27 LAB — CMP (CANCER CENTER ONLY)
ALT: 13 U/L (ref 0–44)
AST: 14 U/L — ABNORMAL LOW (ref 15–41)
Albumin: 3.5 g/dL (ref 3.5–5.0)
Alkaline Phosphatase: 64 U/L (ref 38–126)
Anion gap: 6 (ref 5–15)
BUN: 20 mg/dL (ref 8–23)
CO2: 25 mmol/L (ref 22–32)
Calcium: 9.4 mg/dL (ref 8.9–10.3)
Chloride: 109 mmol/L (ref 98–111)
Creatinine: 1.72 mg/dL — ABNORMAL HIGH (ref 0.61–1.24)
GFR, Est AFR Am: 41 mL/min — ABNORMAL LOW (ref >60)
GFR, Estimated: 36 mL/min — ABNORMAL LOW (ref >60)
Glucose, Bld: 104 mg/dL — ABNORMAL HIGH (ref 70–99)
Potassium: 4.3 mmol/L (ref 3.5–5.1)
Sodium: 140 mmol/L (ref 135–145)
Total Bilirubin: 0.6 mg/dL (ref 0.3–1.2)
Total Protein: 5.1 g/dL — ABNORMAL LOW (ref 6.5–8.1)

## 2018-03-27 MED ORDER — BORTEZOMIB CHEMO SQ INJECTION 3.5 MG (2.5MG/ML)
1.3000 mg/m2 | Freq: Once | INTRAMUSCULAR | Status: AC
  Administered 2018-03-27: 2.5 mg via SUBCUTANEOUS
  Filled 2018-03-27: qty 1

## 2018-03-27 MED ORDER — PROCHLORPERAZINE MALEATE 10 MG PO TABS
10.0000 mg | ORAL_TABLET | Freq: Once | ORAL | Status: AC
  Administered 2018-03-27: 10 mg via ORAL

## 2018-03-27 MED ORDER — PROCHLORPERAZINE MALEATE 10 MG PO TABS
ORAL_TABLET | ORAL | Status: AC
  Filled 2018-03-27: qty 1

## 2018-03-27 NOTE — Progress Notes (Signed)
Nutrition Assessment   Reason for Assessment:   Patient identified on Malnutrition screening tool for poor appetite and weight loss  ASSESSMENT:  83 year old male with multiple myeloma currently receiving chemotherapy.  Past medical history of CKD, afib, HTN.    Met with patient and wife this am during infusion.  Patient reports taste is off and no appetite.  Wife also reports that patient has had a hard time balancing diarrhea, abdominal pain and constipation which has effected his appetite.  "When he is hurting he does not want to eat.  Patient has been drinking carnation instant breakfast daily for added nutrition.  Wife tries to prepare healthy meals of fruit, yogurt, nuts, meats, vegetables.    Nutrition Focused Physical Exam: deferred at this time  Medications: Vit D, compazine  Labs: creatinine 1.83  Anthropometrics:   Height: 66 inches Weight: 157 lb (6/18) Noted 178 lb in Jan 2019.  Wife reports patient weighed 75 lb heavier several years ago and has gradually lost weight most of it intentionally until recently. BMI: 25  12% weight loss in the last 5 months.     Estimated Energy Needs  Kcals: 1775-2100 calories/d Protein: 89-105 g/d Fluid: 2.1 L/d  NUTRITION DIAGNOSIS: Inadequate oral intake related to taste change, poor appetite as evidenced by 12% weight loss in 5 months   INTERVENTION:   Discussed importance of good nutrition Discussed strategies to increase calories and protein.  Fact sheet given. Discussed oral nutrition supplements with high calories.  Samples given and coupons.  Encouraged small frequent meals. Discussed strategies for taste change.  Fact sheet given.      MONITORING, EVALUATION, GOAL: Patient will consume adequate calories and protein to maintain weight   NEXT VISIT: July 23 during infusion  Aurea Aronov B. Zenia Resides, [place], [place] Registered Dietitian [phone] (pager)

## 2018-03-27 NOTE — Progress Notes (Signed)
OK to treat with labs (Creat) today per MD [facility]

## 2018-03-27 NOTE — Patient Instructions (Signed)
[facility] Discharge Instructions for Patients Receiving Chemotherapy  Today you received the following chemotherapy agents Velcade.  To help prevent nausea and vomiting after your treatment, we encourage you to take your nausea medication as directed.  If you develop nausea and vomiting that is not controlled by your nausea medication, call the clinic.   BELOW ARE SYMPTOMS THAT SHOULD BE REPORTED IMMEDIATELY:  *FEVER GREATER THAN 100.5 F  *CHILLS WITH OR WITHOUT FEVER  NAUSEA AND VOMITING THAT IS NOT CONTROLLED WITH YOUR NAUSEA MEDICATION  *UNUSUAL SHORTNESS OF BREATH  *UNUSUAL BRUISING OR BLEEDING  TENDERNESS IN MOUTH AND THROAT WITH OR WITHOUT PRESENCE OF ULCERS  *URINARY PROBLEMS  *BOWEL PROBLEMS  UNUSUAL RASH Items with * indicate a potential emergency and should be followed up as soon as possible.  Feel free to call the clinic should you have any questions or concerns. The clinic phone number is [phone].  Please show the CHEMO ALERT CARD at check-in to the Emergency Department and triage nurse.   

## 2018-04-03 ENCOUNTER — Inpatient Hospital Stay: Payer: Medicare Other | Attending: Internal Medicine

## 2018-04-03 ENCOUNTER — Inpatient Hospital Stay: Payer: Medicare Other

## 2018-04-03 DIAGNOSIS — R530 Neoplastic (malignant) related fatigue: Secondary | ICD-10-CM | POA: Diagnosis not present

## 2018-04-03 DIAGNOSIS — D696 Thrombocytopenia, unspecified: Secondary | ICD-10-CM | POA: Diagnosis not present

## 2018-04-03 DIAGNOSIS — Z79899 Other long term (current) drug therapy: Secondary | ICD-10-CM | POA: Insufficient documentation

## 2018-04-03 DIAGNOSIS — Z7901 Long term (current) use of anticoagulants: Secondary | ICD-10-CM | POA: Diagnosis not present

## 2018-04-03 DIAGNOSIS — C9000 Multiple myeloma not having achieved remission: Secondary | ICD-10-CM | POA: Insufficient documentation

## 2018-04-03 DIAGNOSIS — Z8546 Personal history of malignant neoplasm of prostate: Secondary | ICD-10-CM | POA: Diagnosis not present

## 2018-04-03 DIAGNOSIS — Z5112 Encounter for antineoplastic immunotherapy: Secondary | ICD-10-CM | POA: Diagnosis present

## 2018-04-03 DIAGNOSIS — Z9889 Other specified postprocedural states: Secondary | ICD-10-CM | POA: Insufficient documentation

## 2018-04-03 DIAGNOSIS — I480 Paroxysmal atrial fibrillation: Secondary | ICD-10-CM | POA: Insufficient documentation

## 2018-04-03 LAB — CMP (CANCER CENTER ONLY)
ALT: 15 U/L (ref 0–44)
AST: 13 U/L — ABNORMAL LOW (ref 15–41)
Albumin: 3.5 g/dL (ref 3.5–5.0)
Alkaline Phosphatase: 66 U/L (ref 38–126)
Anion gap: 5 (ref 5–15)
BUN: 14 mg/dL (ref 8–23)
CO2: 27 mmol/L (ref 22–32)
Calcium: 9.6 mg/dL (ref 8.9–10.3)
Chloride: 107 mmol/L (ref 98–111)
Creatinine: 1.66 mg/dL — ABNORMAL HIGH (ref 0.61–1.24)
GFR, Est AFR Am: 43 mL/min — ABNORMAL LOW (ref >60)
GFR, Estimated: 37 mL/min — ABNORMAL LOW (ref >60)
Glucose, Bld: 92 mg/dL (ref 70–99)
Potassium: 4.2 mmol/L (ref 3.5–5.1)
Sodium: 139 mmol/L (ref 135–145)
Total Bilirubin: 0.7 mg/dL (ref 0.3–1.2)
Total Protein: 5 g/dL — ABNORMAL LOW (ref 6.5–8.1)

## 2018-04-03 LAB — CBC WITH DIFFERENTIAL (CANCER CENTER ONLY)
Basophils Absolute: 0 10*3/uL (ref 0.0–0.1)
Basophils Relative: 1 %
Eosinophils Absolute: 0.1 10*3/uL (ref 0.0–0.5)
Eosinophils Relative: 4 %
HCT: 37.2 % — ABNORMAL LOW (ref 38.4–49.9)
Hemoglobin: 12.5 g/dL — ABNORMAL LOW (ref 13.0–17.1)
Lymphocytes Relative: 22 %
Lymphs Abs: 0.7 10*3/uL — ABNORMAL LOW (ref 0.9–3.3)
MCH: 34.5 pg — ABNORMAL HIGH (ref 27.2–33.4)
MCHC: 33.6 g/dL (ref 32.0–36.0)
MCV: 102.5 fL — ABNORMAL HIGH (ref 79.3–98.0)
Monocytes Absolute: 0.3 10*3/uL (ref 0.1–0.9)
Monocytes Relative: 9 %
Neutro Abs: 2 10*3/uL (ref 1.5–6.5)
Neutrophils Relative %: 64 %
Platelet Count: 52 10*3/uL — ABNORMAL LOW (ref 140–400)
RBC: 3.63 MIL/uL — ABNORMAL LOW (ref 4.20–5.82)
RDW: 16.6 % — ABNORMAL HIGH (ref 11.0–14.6)
WBC Count: 3 10*3/uL — ABNORMAL LOW (ref 4.0–10.3)

## 2018-04-03 NOTE — Progress Notes (Signed)
Per Dr. Julien Nordmann pt will not receive velcade injection related to PLT being 52.  Pt will need to follow up next week for labs.  Pt educated and discharged home.

## 2018-04-03 NOTE — Patient Instructions (Addendum)
Thrombocytopenia Thrombocytopenia means that you have a low number of platelets in your blood. Platelets are tiny cells in the blood. When you bleed, they clump together at the cut or injury to stop the bleeding. This is called blood clotting. Not having enough platelets can cause bleeding problems. Follow these instructions at home: General instructions  Check your skin and inside your mouth for bruises or blood as told by your doctor.  Check to see if there is blood in your spit (sputum), pee (urine), and poop (stool). Do this as told by your doctor.  Ask your doctor if you can drink alcohol.  Take over-the-counter and prescription medicines only as told by your doctor.  Tell all of your doctors that you have this condition. Be sure to tell your dentist and eye doctor too. Activity  Do not do activities that can cause bumps or bruises until your doctor says it is okay.  Be careful not to cut yourself: ? When you shave. ? When you use scissors, needles, knives, or other tools.  Be careful not to burn yourself: ? When you use an iron. ? When you cook. Contact a doctor if:  You have bruises and you do not know why. Get help right away if:  You are bleeding anywhere on your body.  You have blood in your spit, pee, or poop. This information is not intended to replace advice given to you by your health care provider. Make sure you discuss any questions you have with your health care provider. Document Released: 09/08/2011 Document Revised: 05/22/2016 Document Reviewed: 03/23/2015 Elsevier Interactive Patient Education  2018 [employer]   

## 2018-04-10 ENCOUNTER — Inpatient Hospital Stay: Payer: Medicare Other

## 2018-04-10 VITALS — BP 108/70 | HR 75 | Temp 97.7°F | Resp 18

## 2018-04-10 DIAGNOSIS — C9000 Multiple myeloma not having achieved remission: Secondary | ICD-10-CM | POA: Diagnosis not present

## 2018-04-10 LAB — CBC WITH DIFFERENTIAL (CANCER CENTER ONLY)
Basophils Absolute: 0 10*3/uL (ref 0.0–0.1)
Basophils Relative: 1 %
Eosinophils Absolute: 0.2 10*3/uL (ref 0.0–0.5)
Eosinophils Relative: 6 %
HCT: 36.7 % — ABNORMAL LOW (ref 38.4–49.9)
Hemoglobin: 12.4 g/dL — ABNORMAL LOW (ref 13.0–17.1)
Lymphocytes Relative: 17 %
Lymphs Abs: 0.7 10*3/uL — ABNORMAL LOW (ref 0.9–3.3)
MCH: 34.5 pg — ABNORMAL HIGH (ref 27.2–33.4)
MCHC: 33.9 g/dL (ref 32.0–36.0)
MCV: 101.6 fL — ABNORMAL HIGH (ref 79.3–98.0)
Monocytes Absolute: 0.7 10*3/uL (ref 0.1–0.9)
Monocytes Relative: 19 %
Neutro Abs: 2.3 10*3/uL (ref 1.5–6.5)
Neutrophils Relative %: 57 %
Platelet Count: 59 10*3/uL — ABNORMAL LOW (ref 140–400)
RBC: 3.61 MIL/uL — ABNORMAL LOW (ref 4.20–5.82)
RDW: 16.7 % — ABNORMAL HIGH (ref 11.0–14.6)
WBC Count: 3.9 10*3/uL — ABNORMAL LOW (ref 4.0–10.3)

## 2018-04-10 LAB — CMP (CANCER CENTER ONLY)
ALT: 15 U/L (ref 0–44)
AST: 11 U/L — ABNORMAL LOW (ref 15–41)
Albumin: 3.4 g/dL — ABNORMAL LOW (ref 3.5–5.0)
Alkaline Phosphatase: 63 U/L (ref 38–126)
Anion gap: 6 (ref 5–15)
BUN: 16 mg/dL (ref 8–23)
CO2: 27 mmol/L (ref 22–32)
Calcium: 9.4 mg/dL (ref 8.9–10.3)
Chloride: 107 mmol/L (ref 98–111)
Creatinine: 1.6 mg/dL — ABNORMAL HIGH (ref 0.61–1.24)
GFR, Est AFR Am: 45 mL/min — ABNORMAL LOW (ref >60)
GFR, Estimated: 39 mL/min — ABNORMAL LOW (ref >60)
Glucose, Bld: 89 mg/dL (ref 70–99)
Potassium: 4 mmol/L (ref 3.5–5.1)
Sodium: 140 mmol/L (ref 135–145)
Total Bilirubin: 0.8 mg/dL (ref 0.3–1.2)
Total Protein: 5 g/dL — ABNORMAL LOW (ref 6.5–8.1)

## 2018-04-10 MED ORDER — BORTEZOMIB CHEMO SQ INJECTION 3.5 MG (2.5MG/ML)
1.3000 mg/m2 | Freq: Once | INTRAMUSCULAR | Status: AC
  Administered 2018-04-10: 2.5 mg via SUBCUTANEOUS
  Filled 2018-04-10: qty 1

## 2018-04-10 MED ORDER — PROCHLORPERAZINE MALEATE 10 MG PO TABS: 10.0000 mg | ORAL_TABLET | Freq: Once | ORAL | Status: DC

## 2018-04-10 NOTE — Progress Notes (Signed)
Okay to treat with platelets of 59 and creatinine of 1.60 per Dr. Julien Nordmann.

## 2018-04-10 NOTE — Patient Instructions (Signed)
[facility] Discharge Instructions for Patients Receiving Chemotherapy  Today you received the following chemotherapy agents Velcade.  To help prevent nausea and vomiting after your treatment, we encourage you to take your nausea medication as directed.  If you develop nausea and vomiting that is not controlled by your nausea medication, call the clinic.   BELOW ARE SYMPTOMS THAT SHOULD BE REPORTED IMMEDIATELY:  *FEVER GREATER THAN 100.5 F  *CHILLS WITH OR WITHOUT FEVER  NAUSEA AND VOMITING THAT IS NOT CONTROLLED WITH YOUR NAUSEA MEDICATION  *UNUSUAL SHORTNESS OF BREATH  *UNUSUAL BRUISING OR BLEEDING  TENDERNESS IN MOUTH AND THROAT WITH OR WITHOUT PRESENCE OF ULCERS  *URINARY PROBLEMS  *BOWEL PROBLEMS  UNUSUAL RASH Items with * indicate a potential emergency and should be followed up as soon as possible.  Feel free to call the clinic should you have any questions or concerns. The clinic phone number is [phone].  Please show the CHEMO ALERT CARD at check-in to the Emergency Department and triage nurse.   

## 2018-04-10 NOTE — Progress Notes (Signed)
Cardiology Office Note    Date:  04/11/2018   ID:  Devine, Klingel [birth year], MRN [id number]  PCP:  [name], MD  Cardiologist: [name], MD   Chief Complaint  Patient presents with  . Atrial Fibrillation  . Congestive Heart Failure    History of Present Illness:  Thomas [name] is a 83 y.o. male paroxysmal atrial fibrillation, chronic diastolic heart failure, hypertension, chronic kidney disease, unable to use anticoagulation therapy due to recurrent GU bleeding, and mild aortic stenosis.Other medical issues include metastatic prostate CA ([place]) and indolent multiple myeloma.Recent recurrent atrial fibrillation.   Accompanied by his wife and daughter.  He feels well.  He denies orthopnea.  Not aware of any irregularity in heart rhythm.  Has been on 200 mg twice daily of amiodarone now for approximately 2-1/2 weeks.  He has not had syncope.  He denies chest pain.  No lower extremity swelling.  Being treated for metastatic prostate cancer.  No bleeding on apixaban.   Past Medical History:  Diagnosis Date  . BPH (benign prostatic hypertrophy)   . Chronic kidney disease    Hx of bilateral renal cysts.  . Glaucoma   . Heart murmur   . History of atrial fibrillation 03/09/09  . Hx of epiglottitis    2011  . Hypercholesterolemia   . Hypertension   . Mild aortic stenosis   . Prostate cancer ([place]) 7/12   T1C Ad Ca  s/p TURP, observation only  . Sleep apnea   . Smoldering multiple myeloma ([place])     Past Surgical History:  Procedure Laterality Date  . ADRENALECTOMY  02/12/11   Left, for Pheochromocytoma  . CARDIOVERSION N/A 12/26/2013   Procedure: CARDIOVERSION;  Surgeon: [name], MD;  Location: [facility];  Service: Cardiovascular;  Laterality: N/A;  . CHOLECYSTECTOMY  02/12/11  . GASTRECTOMY  1984   S/P gastrectomy for ulcer  . KNEE SURGERY  1971   S/P Right knee surgery  . TEE WITHOUT CARDIOVERSION N/A 12/26/2013   Procedure:  TRANSESOPHAGEAL ECHOCARDIOGRAM (TEE);  Surgeon: [name], MD;  Location: [facility];  Service: Cardiovascular;  Laterality: N/A;  . TRANSURETHRAL RESECTION OF PROSTATE  7/12    Current Medications: Outpatient Medications Prior to Visit  Medication Sig Dispense Refill  . acetaminophen (TYLENOL) 500 MG tablet Take 1,000 mg by mouth at bedtime.    Marland Kitchen acyclovir (ZOVIRAX) 200 MG capsule Take 1 capsule (200 mg total) by mouth 2 (two) times daily. 60 capsule 4  . apixaban (ELIQUIS) 2.5 MG TABS tablet Take 1 tablet (2.5 mg total) by mouth 2 (two) times daily. 60 tablet 11  . brimonidine-timolol (COMBIGAN) 0.2-0.5 % ophthalmic solution Place 1 drop into both eyes 2 (two) times daily.     . Cholecalciferol (VITAMIN D3) 1000 UNITS CAPS Take 2 capsules by mouth daily.     Marland Kitchen dexamethasone (DECADRON) 4 MG tablet 5 tablets p.o. every week start with the first dose of Velcade. 80 tablet 4  . latanoprost (XALATAN) 0.005 % ophthalmic solution INSTILL 1 DROP INTO BOTH EYES EVERY DAY AT NIGHT  6  . lenalidomide (REVLIMID) 10 MG capsule Take 1 capsule (10 mg total) by mouth daily. Take for 21 days on, 7 days off, repeat every 61 days-Adult male Adult Male    Authorization # [id number] 21 capsule 0  . levothyroxine (SYNTHROID, LEVOTHROID) 50 MCG tablet TAKE 1 TABLET BY MOUTH DAILY BEFORE BREAKFAST. 90 tablet 2  . prochlorperazine (COMPAZINE) 10  MG tablet Take 1 tablet (10 mg total) by mouth every 6 (six) hours as needed for nausea or vomiting. 30 tablet 0  . amiodarone (PACERONE) 200 MG tablet Take 2 tablets (400 mg total) by mouth daily. For 2 weeks until follow up 30 tablet 0  . amLODipine (NORVASC) 5 MG tablet Take 1 tablet (5 mg total) by mouth daily. 90 tablet 2  . furosemide (LASIX) 40 MG tablet Take 1 tablet (40 mg total) by mouth daily. 90 tablet 3   No facility-administered medications prior to visit.      Allergies:   Benadryl [diphenhydramine hcl]; Ceftriaxone sodium; Contrast media [iodinated  diagnostic agents]; Nsaids; Penicillins; Levaquin [levofloxacin]; and Chlorhexidine   Social History   Socioeconomic History  . Marital status: Married    Spouse name: Not on file  . Number of children: 3  . Years of education: Not on file  . Highest education level: Not on file  Occupational History    Employer: RETIRED  Social Needs  . Financial resource strain: Not on file  . Food insecurity:    Worry: Not on file    Inability: Not on file  . Transportation needs:    Medical: Not on file    Non-medical: Not on file  Tobacco Use  . Smoking status: Never Smoker  . Smokeless tobacco: Never Used  Substance and Sexual Activity  . Alcohol use: No  . Drug use: No  . Sexual activity: Not Currently  Lifestyle  . Physical activity:    Days per week: Not on file    Minutes per session: Not on file  . Stress: Not on file  Relationships  . Social connections:    Talks on phone: Not on file    Gets together: Not on file    Attends religious service: Not on file    Active member of club or organization: Not on file    Attends meetings of clubs or organizations: Not on file    Relationship status: Not on file  Other Topics Concern  . Not on file  Social History Narrative  . Not on file     Family History:  The patient's family history includes Kidney Stones in his brother; Liver disease in his sister, sister, and sister; Melanoma in his brother; Polymyositis in his mother; Prostate cancer in his brother and father.   ROS:   Please see the history of present illness.    In the urine or stool.  Appetite is been stable. All other systems reviewed and are negative.   PHYSICAL EXAM:   VS:  BP 112/66   Pulse 77   Ht 5' 6"  (1.676 m)   Wt 157 lb (71.2 kg)   BMI 25.34 kg/m    GEN: Well nourished, well developed, in no acute distress  HEENT: normal  Neck: no JVD, carotid bruits, or masses Cardiac: IIRR; no murmurs, rubs, or gallops,no edema  Respiratory:  clear to  auscultation bilaterally, normal work of breathing GI: soft, nontender, nondistended, + BS MS: no deformity or atrophy  Skin: warm and dry, no rash Neuro:  Alert and Oriented x 3, Strength and sensation are intact Psych: euthymic mood, full affect  Wt Readings from Last 3 Encounters:  04/11/18 157 lb (71.2 kg)  03/20/18 157 lb 12.8 oz (71.6 kg)  03/19/18 157 lb 1.9 oz (71.3 kg)      Studies/Labs Reviewed:   EKG:  EKG atrial fibrillation with prolonged QT and controlled ventricular response.  Recent  Labs: 04/10/2018: ALT 15; BUN 16; Creatinine 1.60; Hemoglobin 12.4; Platelet Count 59; Potassium 4.0; Sodium 140   Lipid Panel No results found for: CHOL, TRIG, HDL, CHOLHDL, VLDL, LDLCALC, LDLDIRECT  Additional studies/ records that were reviewed today include:  None    ASSESSMENT:    1. Paroxysmal atrial fibrillation (HCC)   2. On amiodarone therapy   3. OSA on CPAP   4. Chronic anticoagulation   5. Multiple myeloma not having achieved remission ([place])      PLAN:  In order of problems listed above:  1. He has not converted with amiodarone loading.  He is doing well with rate control.  Considering his comorbidities, we have decided to go with continuous anticoagulation therapy, discontinuation of amiodarone, and rate control using alternative means then amiodarone.  Start diltiazem CD 120 mg/day.  Discontinue amlodipine.  Continue apixaban. 2. This medication will be discontinued as we have decided to go with rate control rather than rhythm control.  Follow-up in 2 weeks to reassess heart rate.  Further up titration and diltiazem will likely be needed.  May also need to add digoxin for rate control at some point in the future since his blood pressures are soft related to recent weight loss in the setting of metastatic prostate cancer. 3. Not addressed 4. Continue apixaban long-term.  He does not want to be on Coumadin.  He may not be able to afford apixaban long-term. 5. We will  continue long-term anticoagulation therapy since he has not had bleeding issues.   Clinical follow-up in 2 weeks (with APP) and 4 weeks (with me).  Reason for follow-up will be to upwardly titrate rate control as amiodarone washes out. Medication Adjustments/Labs and Tests Ordered: Current medicines are reviewed at length with the patient today.  Concerns regarding medicines are outlined above.  Medication changes, Labs and Tests ordered today are listed in the Patient Instructions below. Patient Instructions  Medication Instructions:  1) DISCONTINUE Amiodarone and Amlodipine 2) START Cardizem (Diltiazem) CD 159m once daily  Labwork: None  Testing/Procedures: None  Follow-Up: Your physician recommends that you schedule a follow-up appointment in: 2 weeks with a PA or NP (Can have 7/29 at 11:30A with LCecilie Kicks NP)  Your physician recommends that you schedule a follow-up appointment in: 4-6 weeks with Dr. STamala Julian(Can have 8/16 at 2:40P or 8/19 at 3P)    Any Other Special Instructions Will Be Listed Below (If Applicable).     If you need a refill on your cardiac medications before your next appointment, please call your pharmacy.      Signed, HSinclair Grooms MD  04/11/2018 11:25 AM    [place] [place]  [address]Phone: [phone] Fax: [phone]

## 2018-04-11 ENCOUNTER — Encounter: Payer: Self-pay | Admitting: Interventional Cardiology

## 2018-04-11 ENCOUNTER — Ambulatory Visit: Payer: Medicare Other | Admitting: Interventional Cardiology

## 2018-04-11 VITALS — BP 112/66 | HR 77 | Ht 66.0 in | Wt 157.0 lb

## 2018-04-11 DIAGNOSIS — Z79899 Other long term (current) drug therapy: Secondary | ICD-10-CM

## 2018-04-11 DIAGNOSIS — C9000 Multiple myeloma not having achieved remission: Secondary | ICD-10-CM

## 2018-04-11 DIAGNOSIS — Z9989 Dependence on other enabling machines and devices: Secondary | ICD-10-CM

## 2018-04-11 DIAGNOSIS — I480 Paroxysmal atrial fibrillation: Secondary | ICD-10-CM | POA: Diagnosis not present

## 2018-04-11 DIAGNOSIS — Z7901 Long term (current) use of anticoagulants: Secondary | ICD-10-CM | POA: Diagnosis not present

## 2018-04-11 DIAGNOSIS — G4733 Obstructive sleep apnea (adult) (pediatric): Secondary | ICD-10-CM | POA: Diagnosis not present

## 2018-04-11 MED ORDER — DILTIAZEM HCL ER COATED BEADS 120 MG PO CP24: 120.0000 mg | ORAL_CAPSULE | Freq: Every day | ORAL | 1 refills | Status: DC

## 2018-04-11 NOTE — Patient Instructions (Signed)
Medication Instructions:  1) DISCONTINUE Amiodarone and Amlodipine 2) START Cardizem (Diltiazem) CD 120mg  once daily  Labwork: None  Testing/Procedures: None  Follow-Up: Your physician recommends that you schedule a follow-up appointment in: 2 weeks with a PA or NP (Can have 7/29 at 11:30A with Cecilie Kicks, NP)  Your physician recommends that you schedule a follow-up appointment in: 4-6 weeks with Dr. Tamala Julian (Can have 8/16 at 2:40P or 8/19 at 3P)    Any Other Special Instructions Will Be Listed Below (If Applicable).     If you need a refill on your cardiac medications before your next appointment, please call your pharmacy.

## 2018-04-12 MED FILL — DEXAMETHASONE 4 MG TABLET: 4 | 84 days supply | Qty: 60 | Fill #1

## 2018-04-16 ENCOUNTER — Other Ambulatory Visit: Payer: Self-pay | Admitting: Medical Oncology

## 2018-04-16 DIAGNOSIS — C9000 Multiple myeloma not having achieved remission: Secondary | ICD-10-CM

## 2018-04-16 MED ORDER — LENALIDOMIDE 10 MG PO CAPS: 10.0000 mg | ORAL_CAPSULE | Freq: Every day | ORAL | 0 refills | Status: DC

## 2018-04-17 ENCOUNTER — Telehealth: Payer: Self-pay | Admitting: Internal Medicine

## 2018-04-17 ENCOUNTER — Encounter: Payer: Self-pay | Admitting: Internal Medicine

## 2018-04-17 ENCOUNTER — Inpatient Hospital Stay: Payer: Medicare Other

## 2018-04-17 ENCOUNTER — Inpatient Hospital Stay (HOSPITAL_BASED_OUTPATIENT_CLINIC_OR_DEPARTMENT_OTHER): Payer: Medicare Other | Admitting: Internal Medicine

## 2018-04-17 VITALS — BP 110/67 | HR 62 | Temp 97.7°F | Resp 17 | Ht 66.0 in | Wt 154.7 lb

## 2018-04-17 DIAGNOSIS — C9000 Multiple myeloma not having achieved remission: Secondary | ICD-10-CM

## 2018-04-17 DIAGNOSIS — I129 Hypertensive chronic kidney disease with stage 1 through stage 4 chronic kidney disease, or unspecified chronic kidney disease: Secondary | ICD-10-CM

## 2018-04-17 DIAGNOSIS — N181 Chronic kidney disease, stage 1: Secondary | ICD-10-CM | POA: Diagnosis not present

## 2018-04-17 LAB — CBC WITH DIFFERENTIAL (CANCER CENTER ONLY)
Basophils Absolute: 0 10*3/uL (ref 0.0–0.1)
Basophils Relative: 0 %
Eosinophils Absolute: 0.3 10*3/uL (ref 0.0–0.5)
Eosinophils Relative: 9 %
HCT: 38.5 % (ref 38.4–49.9)
Hemoglobin: 13 g/dL (ref 13.0–17.1)
Lymphocytes Relative: 19 %
Lymphs Abs: 0.6 10*3/uL — ABNORMAL LOW (ref 0.9–3.3)
MCH: 34.5 pg — ABNORMAL HIGH (ref 27.2–33.4)
MCHC: 33.8 g/dL (ref 32.0–36.0)
MCV: 102.1 fL — ABNORMAL HIGH (ref 79.3–98.0)
Monocytes Absolute: 0.4 10*3/uL (ref 0.1–0.9)
Monocytes Relative: 14 %
Neutro Abs: 1.9 10*3/uL (ref 1.5–6.5)
Neutrophils Relative %: 58 %
Platelet Count: 45 10*3/uL — ABNORMAL LOW (ref 140–400)
RBC: 3.77 MIL/uL — ABNORMAL LOW (ref 4.20–5.82)
RDW: 15.9 % — ABNORMAL HIGH (ref 11.0–14.6)
WBC Count: 3.2 10*3/uL — ABNORMAL LOW (ref 4.0–10.3)

## 2018-04-17 LAB — CMP (CANCER CENTER ONLY)
ALT: 21 U/L (ref 0–44)
AST: 11 U/L — ABNORMAL LOW (ref 15–41)
Albumin: 3.5 g/dL (ref 3.5–5.0)
Alkaline Phosphatase: 69 U/L (ref 38–126)
Anion gap: 6 (ref 5–15)
BUN: 16 mg/dL (ref 8–23)
CO2: 28 mmol/L (ref 22–32)
Calcium: 9.6 mg/dL (ref 8.9–10.3)
Chloride: 108 mmol/L (ref 98–111)
Creatinine: 1.54 mg/dL — ABNORMAL HIGH (ref 0.61–1.24)
GFR, Est AFR Am: 47 mL/min — ABNORMAL LOW (ref >60)
GFR, Estimated: 41 mL/min — ABNORMAL LOW (ref >60)
Glucose, Bld: 112 mg/dL — ABNORMAL HIGH (ref 70–99)
Potassium: 4.3 mmol/L (ref 3.5–5.1)
Sodium: 142 mmol/L (ref 135–145)
Total Bilirubin: 0.8 mg/dL (ref 0.3–1.2)
Total Protein: 5.2 g/dL — ABNORMAL LOW (ref 6.5–8.1)

## 2018-04-17 NOTE — Progress Notes (Signed)
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 2) History of pheochromocytoma LEFT adrenal gland status post resection on 03/16/2009 3) Prostate adenocarcinoma  PRIOR THERAPY: None  CURRENT THERAPY: Systemic therapy with weekly Velcade 1.3 mg/KG subcutaneously, Revlimid 25 mg p.o. daily for 21 days every 4 weeks as well as Decadron 20 mg p.o. weekly.  First dose November 07, 2017.  Status post 7 cycles.  INTERVAL HISTORY: Thomas [name] 83 y.o. male returns to the clinic today for follow-up visit accompanied by his wife.  The patient is feeling fine today with no specific complaints except for fatigue and lack of stamina.  He also has shortness of breath with exertion.  He denied having any chest pain, cough or hemoptysis.  He denied having any recent weight loss or night sweats.  He has no nausea, vomiting, diarrhea or constipation.  He denied having any fever or chills.  He has been tolerating his treatment with Velcade, Revlimid and Decadron fairly well.  He is here today for evaluation before starting cycle #8.  MEDICAL HISTORY: Past Medical History:  Diagnosis Date  . BPH (benign prostatic hypertrophy)   . Chronic kidney disease    Hx of bilateral renal cysts.  . Glaucoma   . Heart murmur   . History of atrial fibrillation 03/09/09  . Hx of epiglottitis    2011  . Hypercholesterolemia   . Hypertension   . Mild aortic stenosis   . Prostate cancer ([place]) 7/12   T1C Ad Ca  s/p TURP, observation only  . Sleep apnea   . Smoldering multiple myeloma (HCC)     ALLERGIES:  is allergic to benadryl [diphenhydramine hcl]; ceftriaxone sodium; contrast media [iodinated diagnostic agents]; nsaids; penicillins; levaquin [levofloxacin]; and  chlorhexidine.  MEDICATIONS:  Current Outpatient Medications  Medication Sig Dispense Refill  . acetaminophen (TYLENOL) 500 MG tablet Take 1,000 mg by mouth at bedtime.    Marland Kitchen acyclovir (ZOVIRAX) 200 MG capsule Take 1 capsule (200 mg total) by mouth 2 (two) times daily. 60 capsule 4  . apixaban (ELIQUIS) 2.5 MG TABS tablet Take 1 tablet (2.5 mg total) by mouth 2 (two) times daily. 60 tablet 11  . brimonidine-timolol (COMBIGAN) 0.2-0.5 % ophthalmic solution Place 1 drop into both eyes 2 (two) times daily.     . Cholecalciferol (VITAMIN D3) 1000 UNITS CAPS Take 2 capsules by mouth daily.     Marland Kitchen dexamethasone (DECADRON) 4 MG tablet 5 tablets p.o. every week start with the first dose of Velcade. 80 tablet 4  . diltiazem (DILTIAZEM CD) 120 MG 24 hr capsule Take 1 capsule (120 mg total) by mouth daily. 90 capsule 1  . latanoprost (XALATAN) 0.005 % ophthalmic solution INSTILL 1 DROP INTO BOTH EYES EVERY DAY AT NIGHT  6  . lenalidomide (REVLIMID) 10 MG capsule Take 1 capsule (10 mg total) by mouth daily. Take for 21 days on, 7 days off, repeat every 48 days-Adult male Adult Male    04/16/18 Authorization #[id number] 21 capsule 0  . levothyroxine (SYNTHROID, LEVOTHROID) 50 MCG tablet TAKE 1 TABLET BY MOUTH DAILY BEFORE BREAKFAST. 90 tablet 2  . prochlorperazine (COMPAZINE) 10 MG tablet Take 1 tablet (10 mg total) by mouth every 6 (six) hours as needed for nausea  or vomiting. 30 tablet 0   No current facility-administered medications for this visit.     SURGICAL HISTORY:  Past Surgical History:  Procedure Laterality Date  . ADRENALECTOMY  02/12/11   Left, for Pheochromocytoma  . CARDIOVERSION N/A 12/26/2013   Procedure: CARDIOVERSION;  Surgeon: [name], MD;  Location: [facility];  Service: Cardiovascular;  Laterality: N/A;  . CHOLECYSTECTOMY  02/12/11  . GASTRECTOMY  1984   S/P gastrectomy for ulcer  . KNEE SURGERY  1971   S/P Right knee surgery  . TEE WITHOUT CARDIOVERSION N/A 12/26/2013    Procedure: TRANSESOPHAGEAL ECHOCARDIOGRAM (TEE);  Surgeon: [name], MD;  Location: [facility];  Service: Cardiovascular;  Laterality: N/A;  . TRANSURETHRAL RESECTION OF PROSTATE  7/12    REVIEW OF SYSTEMS:  A comprehensive review of systems was negative except for: Constitutional: positive for fatigue Respiratory: positive for dyspnea on exertion   PHYSICAL EXAMINATION: General appearance: alert, cooperative, fatigued and no distress Head: Normocephalic, without obvious abnormality, atraumatic Neck: no adenopathy, no JVD, supple, symmetrical, trachea midline and thyroid not enlarged, symmetric, no tenderness/mass/nodules Lymph nodes: Cervical, supraclavicular, and axillary nodes normal. Resp: clear to auscultation bilaterally Back: symmetric, no curvature. ROM normal. No CVA tenderness. Cardio: regular rate and rhythm, S1, S2 normal, no murmur, click, rub or gallop GI: soft, non-tender; bowel sounds normal; no masses,  no organomegaly Extremities: extremities normal, atraumatic, no cyanosis or edema  ECOG PERFORMANCE STATUS: 1 - Symptomatic but completely ambulatory  Blood pressure 110/67, pulse 62, temperature 97.7 F (36.5 C), temperature source Oral, resp. rate 17, height 5' 6"  (1.676 m), weight 154 lb 11.2 oz (70.2 kg), SpO2 99 %.  LABORATORY DATA: Lab Results  Component Value Date   WBC 3.2 (L) 04/17/2018   HGB 13.0 04/17/2018   HCT 38.5 04/17/2018   MCV 102.1 (H) 04/17/2018   PLT 45 (L) 04/17/2018      Chemistry      Component Value Date/Time   NA 140 04/10/2018 0741   NA 138 08/16/2017 1404   K 4.0 04/10/2018 0741   K 4.5 08/16/2017 1404   CL 107 04/10/2018 0741   CL 109 (H) 03/21/2013 0958   CO2 27 04/10/2018 0741   CO2 24 08/16/2017 1404   BUN 16 04/10/2018 0741   BUN 17.3 08/16/2017 1404   CREATININE 1.60 (H) 04/10/2018 0741   CREATININE 1.8 (H) 08/16/2017 1404      Component Value Date/Time   CALCIUM 9.4 04/10/2018 0741   CALCIUM 9.9 08/16/2017 1404    ALKPHOS 63 04/10/2018 0741   ALKPHOS 86 08/16/2017 1404   AST 11 (L) 04/10/2018 0741   AST 22 08/16/2017 1404   ALT 15 04/10/2018 0741   ALT 16 08/16/2017 1404   BILITOT 0.8 04/10/2018 0741   BILITOT 0.78 08/16/2017 1404       RADIOGRAPHIC STUDIES: No results found.  ASSESSMENT AND PLAN:  This is a very pleasant 83 years old white male with IgG smoldering Loma diagnosed in April 2011. The patient has been on observation since 2011. His recent myeloma panel showed further increase in the free kappa light chain. His recent skeletal bone survey showed no concerning findings for disease progression.  He has a focus of avascular necrosis but no significant lytic lesions. The recent bone marrow biopsy and aspirate showed further increase in the plasma cells to 15%. The patient is currently undergoing treatment with subcutaneous weekly Velcade as well as Revlimid and Decadron status post 7 cycles and been  tolerating this treatment fairly well except for fatigue and thrombocytopenia. His platelets count are low today. I recommended for the patient to hold his treatment with Revlimid, Velcade and Decadron for 2 weeks.  He will have repeat myeloma panel before his next visit with me in 2 weeks for evaluation of his disease. He was advised to call immediately if he has any concerning symptoms in the interval. The patient voices understanding of current disease status and treatment options and is in agreement with the current care plan. All questions were answered. The patient knows to call the clinic with any problems, questions or concerns. We can certainly see the patient much sooner if necessary. I spent 10 minutes counseling the patient face to face. The total time spent in the appointment was 15 minutes.  Disclaimer: This note was dictated with voice recognition software. Similar sounding words can inadvertently be transcribed and may not be corrected upon review.

## 2018-04-17 NOTE — Telephone Encounter (Signed)
Scheduled appt per 7/16 los - gave patient aVS and calender per los.  

## 2018-04-20 ENCOUNTER — Other Ambulatory Visit: Payer: Self-pay | Admitting: Internal Medicine

## 2018-04-20 DIAGNOSIS — C9000 Multiple myeloma not having achieved remission: Secondary | ICD-10-CM

## 2018-04-20 MED FILL — ACYCLOVIR 200 MG CAP: 200 | 30 days supply | Qty: 60 | Fill #0

## 2018-04-24 ENCOUNTER — Ambulatory Visit: Payer: Medicare Other

## 2018-04-24 ENCOUNTER — Inpatient Hospital Stay: Payer: Medicare Other

## 2018-04-24 DIAGNOSIS — C9000 Multiple myeloma not having achieved remission: Secondary | ICD-10-CM | POA: Diagnosis not present

## 2018-04-24 LAB — LACTATE DEHYDROGENASE: LDH: 196 U/L — ABNORMAL HIGH (ref 98–192)

## 2018-04-24 LAB — CBC WITH DIFFERENTIAL (CANCER CENTER ONLY)
Basophils Absolute: 0.1 10*3/uL (ref 0.0–0.1)
Basophils Relative: 1 %
Eosinophils Absolute: 0.1 10*3/uL (ref 0.0–0.5)
Eosinophils Relative: 2 %
HCT: 39 % (ref 38.4–49.9)
Hemoglobin: 13 g/dL (ref 13.0–17.1)
Lymphocytes Relative: 17 %
Lymphs Abs: 0.8 10*3/uL — ABNORMAL LOW (ref 0.9–3.3)
MCH: 34.1 pg — ABNORMAL HIGH (ref 27.2–33.4)
MCHC: 33.3 g/dL (ref 32.0–36.0)
MCV: 102.4 fL — ABNORMAL HIGH (ref 79.3–98.0)
Monocytes Absolute: 0.9 10*3/uL (ref 0.1–0.9)
Monocytes Relative: 19 %
Neutro Abs: 3 10*3/uL (ref 1.5–6.5)
Neutrophils Relative %: 61 %
Platelet Count: 99 10*3/uL — ABNORMAL LOW (ref 140–400)
RBC: 3.81 MIL/uL — ABNORMAL LOW (ref 4.20–5.82)
RDW: 16.3 % — ABNORMAL HIGH (ref 11.0–14.6)
WBC Count: 4.9 10*3/uL (ref 4.0–10.3)

## 2018-04-24 LAB — CMP (CANCER CENTER ONLY)
ALT: 19 U/L (ref 0–44)
AST: 11 U/L — ABNORMAL LOW (ref 15–41)
Albumin: 3.4 g/dL — ABNORMAL LOW (ref 3.5–5.0)
Alkaline Phosphatase: 74 U/L (ref 38–126)
Anion gap: 6 (ref 5–15)
BUN: 25 mg/dL — ABNORMAL HIGH (ref 8–23)
CO2: 27 mmol/L (ref 22–32)
Calcium: 9.7 mg/dL (ref 8.9–10.3)
Chloride: 109 mmol/L (ref 98–111)
Creatinine: 1.6 mg/dL — ABNORMAL HIGH (ref 0.61–1.24)
GFR, Est AFR Am: 45 mL/min — ABNORMAL LOW (ref >60)
GFR, Estimated: 39 mL/min — ABNORMAL LOW (ref >60)
Glucose, Bld: 97 mg/dL (ref 70–99)
Potassium: 4.4 mmol/L (ref 3.5–5.1)
Sodium: 142 mmol/L (ref 135–145)
Total Bilirubin: 0.8 mg/dL (ref 0.3–1.2)
Total Protein: 5.2 g/dL — ABNORMAL LOW (ref 6.5–8.1)

## 2018-04-24 NOTE — Progress Notes (Signed)
Nutrition Follow-up:  Patient with multiple myeloma receiving chemotherapy.    Met with patient and wife this am following labs.  Patient reports appetite is up and down.  Wife reports patient ate small serving of chicken salad and 6 cubes of cantelope last night for dinner.  Likes the boost plus better than ensure.  Has been focusing on high calorie foods (ie adding half and half to oatmeal, peanut butter to toast, etc).    Wife reports constipation and diarrhea is better  Medications: reviewed  Labs: BUN 25, creatinine 1.60  Anthropometrics:   Weight slight decrease to 154 lb 11.2 oz on 7/16 from 157 lb on 6/18   NUTRITION DIAGNOSIS: Inadequate oral intake continues   INTERVENTION:  Encouraged patient to continue with high calorie and high protein foods.   Encouraged small frequent meals. Encouraged patient to maximize calories on "good" days.     MONITORING, EVALUATION, GOAL: Patient will consume adequate calories and protein to maintain weight   NEXT VISIT: August 20 during infusion  Thomas [name], [place], [place] Registered Dietitian [phone] (pager)

## 2018-04-25 LAB — KAPPA/LAMBDA LIGHT CHAINS
Kappa free light chain: 14.9 mg/L (ref 3.3–19.4)
Kappa, lambda light chain ratio: 1.1 (ref 0.26–1.65)
Lambda free light chains: 13.5 mg/L (ref 5.7–26.3)

## 2018-04-25 LAB — BETA 2 MICROGLOBULIN, SERUM: Beta-2 Microglobulin: 3.7 mg/L — ABNORMAL HIGH (ref 0.6–2.4)

## 2018-04-25 LAB — IGG, IGA, IGM
IgA: 22 mg/dL — ABNORMAL LOW (ref 61–437)
IgG (Immunoglobin G), Serum: 325 mg/dL — ABNORMAL LOW (ref 700–1600)
IgM (Immunoglobulin M), Srm: 15 mg/dL (ref 15–143)

## 2018-04-27 NOTE — Progress Notes (Signed)
Cardiology Office Note   Date:  04/30/2018   ID:  Thomas, [name] Jul 27, 1935, MRN [id number]  PCP:  [name], MD  Cardiologist:  Dr. Tamala Julian     Chief Complaint  Patient presents with  . Atrial Fibrillation      History of Present Illness: Thomas [name] is a 83 y.o. male who presents for atrial fib and rate  He has a hx of paroxysmal atrial fibrillation on amiodarone, chronic diastolic heart failure, hypertension, chronic kidney disease, unable to use anticoagulation therapy due to recurrent GU bleeding, and mild aortic stenosis.Other medical issues include metastatic prostate CA (Lupron) and indolent multiple myeloma.Recent recurrent atrial fibrillation.  Last visit pt had not converted with amiodarone, so stopped on last visit,  Amlodipine was stopped and dilttiazem CD was started.  Continued Eliquis.  Plan is for rate control alone.    Today am seeing for HR eval.  May need to increase Dilt, and or add digoxin, depending on BP.  Today HR is 67 a fib.  He is currently fatigued and BP is stable.  The chemo is making fatigued.  He is to not have any for 3 months and he is looking forward to increased energy.  I will not increase the meds.  He is feeling well otherwise.   Past Medical History:  Diagnosis Date  . BPH (benign prostatic hypertrophy)   . Chronic diastolic heart failure ([facility]) 12/27/2013  . Chronic kidney disease    Hx of bilateral renal cysts.  . Encounter for antineoplastic chemotherapy 02/20/2018  . Glaucoma   . Heart murmur   . History of atrial fibrillation 03/09/09  . Hx of epiglottitis    2011  . Hypercholesterolemia   . Hypertension   . Long term current use of amiodarone 11/07/2013   Amiodarone   . Mild aortic stenosis   . OSA on CPAP 06/26/2014  . Paroxysmal atrial fibrillation ([facility]) 11/07/2013  . Prostate cancer ([facility]) 7/12   T1C Ad Ca  s/p TURP, observation only  . Renal insufficiency 09/06/2011  . Sinus node dysfunction ([place])  08/14/2014  . Sleep apnea   . 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 ADRENALECTOMY  02/12/11   Left, for Pheochromocytoma  . CARDIOVERSION N/A 12/26/2013   Procedure: CARDIOVERSION;  Surgeon: [name], MD;  Location: [facility];  Service: Cardiovascular;  Laterality: N/A;  . CHOLECYSTECTOMY  02/12/11  . GASTRECTOMY  1984   S/P gastrectomy for ulcer  . KNEE SURGERY  1971   S/P Right knee surgery  . TEE WITHOUT CARDIOVERSION N/A 12/26/2013   Procedure: TRANSESOPHAGEAL ECHOCARDIOGRAM (TEE);  Surgeon: [name], MD;  Location: [facility];  Service: Cardiovascular;  Laterality: N/A;  . TRANSURETHRAL RESECTION OF PROSTATE  7/12     Current Outpatient Medications  Medication Sig Dispense Refill  . acetaminophen (TYLENOL) 500 MG tablet Take 1,000 mg by mouth at bedtime.    Marland Kitchen apixaban (ELIQUIS) 2.5 MG TABS tablet Take 1 tablet (2.5 mg total) by mouth 2 (two) times daily. 60 tablet 11  . brimonidine-timolol (COMBIGAN) 0.2-0.5 % ophthalmic solution Place 1 drop into both eyes 2 (two) times daily.     . Cholecalciferol (VITAMIN D3) 1000 UNITS CAPS Take 2 capsules by mouth daily.     Marland Kitchen diltiazem (DILTIAZEM CD) 120 MG 24 hr capsule Take 1 capsule (120 mg total) by mouth daily. 90 capsule 1  . latanoprost (XALATAN) 0.005 %  ophthalmic solution INSTILL 1 DROP INTO BOTH EYES EVERY DAY AT NIGHT  6  . levothyroxine (SYNTHROID, LEVOTHROID) 50 MCG tablet TAKE 1 TABLET BY MOUTH DAILY BEFORE BREAKFAST. 90 tablet 2  . prochlorperazine (COMPAZINE) 10 MG tablet Take 1 tablet (10 mg total) by mouth every 6 (six) hours as needed for nausea or vomiting. 30 tablet 0   No current facility-administered medications for this visit.     Allergies:   Benadryl [diphenhydramine hcl]; Ceftriaxone sodium; Contrast media [iodinated diagnostic agents]; Nsaids; Penicillins; Levaquin [levofloxacin]; and Chlorhexidine    Social History:  The patient  reports that  he has never smoked. He has never used smokeless tobacco. He reports that he does not drink alcohol or use drugs.   Family History:  The patient's family history includes Kidney Stones in his brother; Liver disease in his sister, sister, and sister; Melanoma in his brother; Polymyositis in his mother; Prostate cancer in his brother and father.    ROS:  General:no colds or fevers, no weight changes Skin:no rashes or ulcers HEENT:no blurred vision, no congestion CV:see HPI PUL:see HPI GI:no diarrhea constipation or melena, no indigestion GU:no hematuria, no dysuria MS:no joint pain, no claudication Neuro:no syncope, no lightheadedness Endo:no diabetes, + thyroid disease  Wt Readings from Last 3 Encounters:  04/30/18 155 lb 1.9 oz (70.4 kg)  04/30/18 155 lb 12.8 oz (70.7 kg)  04/17/18 154 lb 11.2 oz (70.2 kg)     PHYSICAL EXAM: VS:  BP 120/70   Pulse 64   Ht 5' 6"  (1.676 m)   Wt 155 lb 1.9 oz (70.4 kg)   BMI 25.04 kg/m  , BMI Body mass index is 25.04 kg/m. General:Pleasant affect, NAD Skin:Warm and dry, brisk capillary refill HEENT:normocephalic, sclera clear, mucus membranes moist Neck:supple, no JVD, no bruits  Heart:S1S2 RRR without murmur, gallup, rub or click Lungs:clear without rales, rhonchi, or wheezes XTK:WIOX, non tender, + BS, do not palpate liver spleen or masses Ext:no lower ext edema, 2+ pedal pulses, 2+ radial pulses Neuro:alert and oriented X 3, MAE, follows commands, + facial symmetry    EKG:  EKG is  ordered today. EKG a fib rate controlled at 67   Recent Labs: 04/24/2018: ALT 19; BUN 25; Creatinine 1.60; Hemoglobin 13.0; Platelet Count 99; Potassium 4.4; Sodium 142    Lipid Panel No results found for: CHOL, TRIG, HDL, CHOLHDL, VLDL, LDLCALC, LDLDIRECT     Other studies Reviewed: Additional studies/ records that were reviewed today include:   Echo 10/16/17 .Left ventricle:  The cavity size was normal. There was mild focal basal hypertrophy of  the septum. Systolic function was normal. The estimated ejection fraction was in the range of 60% to 65%. Wall motion was normal; there were no regional wall motion abnormalities. Features are consistent with a pseudonormal left ventricular filling pattern, with concomitant abnormal relaxation and increased filling pressure (grade 2 diastolic dysfunction). Doppler parameters are consistent with high ventricular filling pressure.   Venous dopplers 03/13/18 Final Interpretation: Right: There is no evidence of deep vein thrombosis in the lower extremity. An area with mixed echoes and calcifications is noted in the popliteal fossa. Possibly a complex baker's cyst vs. unknown etiology. Left: No evidence of common femoral vein obstruction.   ASSESSMENT AND PLAN:  1. PAF , continues rate controlled no increase of meds today and keep follow up with Dr. Tamala Julian. --amiodarone stopped.  On eliquis with no bleeding.    Current medicines are reviewed with the patient today.  The patient  Has no concerns regarding medicines.  The following changes have been made:  See above Labs/ tests ordered today include:see above  Disposition:   FU:  see above  Signed, Cecilie Kicks, NP  04/30/2018 11:11 AM    Thomas [name], [address], [place], [place] Phone: [phone]; Fax: [phone]

## 2018-04-30 ENCOUNTER — Telehealth: Payer: Self-pay | Admitting: Internal Medicine

## 2018-04-30 ENCOUNTER — Encounter: Payer: Self-pay | Admitting: Internal Medicine

## 2018-04-30 ENCOUNTER — Ambulatory Visit: Payer: Medicare Other | Admitting: Cardiology

## 2018-04-30 ENCOUNTER — Encounter: Payer: Self-pay | Admitting: Cardiology

## 2018-04-30 ENCOUNTER — Inpatient Hospital Stay: Payer: Medicare Other | Admitting: Internal Medicine

## 2018-04-30 VITALS — BP 120/70 | HR 64 | Ht 66.0 in | Wt 155.1 lb

## 2018-04-30 VITALS — BP 124/74 | HR 77 | Temp 97.7°F | Resp 17 | Ht 66.0 in | Wt 155.8 lb

## 2018-04-30 DIAGNOSIS — Z9889 Other specified postprocedural states: Secondary | ICD-10-CM

## 2018-04-30 DIAGNOSIS — R530 Neoplastic (malignant) related fatigue: Secondary | ICD-10-CM

## 2018-04-30 DIAGNOSIS — C9000 Multiple myeloma not having achieved remission: Secondary | ICD-10-CM | POA: Diagnosis not present

## 2018-04-30 DIAGNOSIS — D696 Thrombocytopenia, unspecified: Secondary | ICD-10-CM

## 2018-04-30 DIAGNOSIS — I480 Paroxysmal atrial fibrillation: Secondary | ICD-10-CM | POA: Diagnosis not present

## 2018-04-30 DIAGNOSIS — Z7901 Long term (current) use of anticoagulants: Secondary | ICD-10-CM

## 2018-04-30 DIAGNOSIS — Z79899 Other long term (current) drug therapy: Secondary | ICD-10-CM

## 2018-04-30 DIAGNOSIS — Z8546 Personal history of malignant neoplasm of prostate: Secondary | ICD-10-CM

## 2018-04-30 NOTE — Progress Notes (Signed)
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 2) History of pheochromocytoma LEFT adrenal gland status post resection on 03/16/2009 3) Prostate adenocarcinoma  PRIOR THERAPY: Systemic therapy with weekly Velcade 1.3 mg/KG subcutaneously, Revlimid 25 mg p.o. daily for 21 days every 4 weeks as well as Decadron 20 mg p.o. weekly.  First dose November 07, 2017.  Status post 7 cycles.   CURRENT THERAPY: Observation.  INTERVAL HISTORY: Thomas [name] 83 y.o. male returns to the clinic today for follow-up visit accompanied by his wife.  The patient has no complaints today except for some aching pain.  He denied having any chest pain, shortness of breath, cough or hemoptysis.  He denied having any fever or chills.  He has no nausea, vomiting, diarrhea or constipation.  The patient has no recent weight loss or night sweats.  He has no headache or visual changes.  He has been tolerating his treatment with Velcade, Revlimid and Decadron fairly well.  He had repeat myeloma panel performed recently and is here for evaluation and discussion of his lab results and treatment options.  MEDICAL HISTORY: Past Medical History:  Diagnosis Date  . BPH (benign prostatic hypertrophy)   . Chronic diastolic heart failure ([place]) 12/27/2013  . Chronic kidney disease    Hx of bilateral renal cysts.  . Encounter for antineoplastic chemotherapy 02/20/2018  . Glaucoma   . Heart murmur   . History of atrial fibrillation 03/09/09  . Hx of epiglottitis    2011  . Hypercholesterolemia   . Hypertension   . Long term current use of amiodarone 11/07/2013   Amiodarone   . Mild aortic stenosis   . OSA on CPAP 06/26/2014  . Paroxysmal atrial fibrillation ([place]) 11/07/2013  .  Prostate cancer ([facility]) 7/12   T1C Ad Ca  s/p TURP, observation only  . Renal insufficiency 09/06/2011  . Sinus node dysfunction ([place]) 08/14/2014  . Sleep apnea   . 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  MEDICATIONS:  Current Outpatient Medications  Medication Sig Dispense Refill  . acetaminophen (TYLENOL) 500 MG tablet Take 1,000 mg by mouth at bedtime.    Marland Kitchen acyclovir (ZOVIRAX) 200 MG capsule TAKE 1 CAPSULE BY MOUTH TWICE DAILY 60 capsule 3  . apixaban (ELIQUIS) 2.5 MG TABS tablet Take 1 tablet (2.5 mg total) by mouth 2 (two) times daily. 60 tablet 11  . brimonidine-timolol (COMBIGAN) 0.2-0.5 % ophthalmic solution Place 1 drop into both eyes 2 (two) times daily.     . Cholecalciferol (VITAMIN D3) 1000 UNITS CAPS Take 2 capsules by mouth daily.     Marland Kitchen dexamethasone (DECADRON) 4 MG tablet 5 tablets p.o. every week start with the first dose of Velcade. 80 tablet 4  . diltiazem (DILTIAZEM CD) 120 MG 24 hr capsule Take 1 capsule (120 mg total) by mouth daily. 90 capsule 1  . latanoprost (XALATAN) 0.005 % ophthalmic solution INSTILL 1 DROP INTO BOTH EYES EVERY DAY AT NIGHT  6  . lenalidomide (REVLIMID) 10 MG capsule Take 1 capsule (10 mg total) by mouth daily. Take for 21 days on, 7 days off, repeat every  105 days-Adult male Adult Male    04/16/18 Authorization #[id number] 21 capsule 0  . levothyroxine (SYNTHROID, LEVOTHROID) 50 MCG tablet TAKE 1 TABLET BY MOUTH DAILY BEFORE BREAKFAST. 90 tablet 2  . prochlorperazine (COMPAZINE) 10 MG tablet Take 1 tablet (10 mg total) by mouth every 6 (six) hours as needed for nausea or vomiting. 30 tablet 0   No current facility-administered medications for this visit.     SURGICAL HISTORY:  Past Surgical History:  Procedure Laterality Date  . ADRENALECTOMY  02/12/11   Left,  for Pheochromocytoma  . CARDIOVERSION N/A 12/26/2013   Procedure: CARDIOVERSION;  Surgeon: [name], MD;  Location: [facility];  Service: Cardiovascular;  Laterality: N/A;  . CHOLECYSTECTOMY  02/12/11  . GASTRECTOMY  1984   S/P gastrectomy for ulcer  . KNEE SURGERY  1971   S/P Right knee surgery  . TEE WITHOUT CARDIOVERSION N/A 12/26/2013   Procedure: TRANSESOPHAGEAL ECHOCARDIOGRAM (TEE);  Surgeon: [name], MD;  Location: [facility];  Service: Cardiovascular;  Laterality: N/A;  . TRANSURETHRAL RESECTION OF PROSTATE  7/12    REVIEW OF SYSTEMS:  Constitutional: positive for fatigue Eyes: negative Ears, nose, mouth, throat, and face: negative Respiratory: negative Cardiovascular: negative Gastrointestinal: negative Genitourinary:negative Integument/breast: negative Hematologic/lymphatic: negative Musculoskeletal:positive for arthralgias Neurological: negative Behavioral/Psych: negative Endocrine: negative Allergic/Immunologic: negative   PHYSICAL EXAMINATION: General appearance: alert, cooperative, fatigued and no distress Head: Normocephalic, without obvious abnormality, atraumatic Neck: no adenopathy, no JVD, supple, symmetrical, trachea midline and thyroid not enlarged, symmetric, no tenderness/mass/nodules Lymph nodes: Cervical, supraclavicular, and axillary nodes normal. Resp: clear to auscultation bilaterally Back: symmetric, no curvature. ROM normal. No CVA tenderness. Cardio: regular rate and rhythm, S1, S2 normal, no murmur, click, rub or gallop GI: soft, non-tender; bowel sounds normal; no masses,  no organomegaly Extremities: extremities normal, atraumatic, no cyanosis or edema Neurologic: Alert and oriented X 3, normal strength and tone. Normal symmetric reflexes. Normal coordination and gait  ECOG PERFORMANCE STATUS: 1 - Symptomatic but completely ambulatory  Blood pressure 124/74, pulse 77, temperature 97.7 F (36.5 C), temperature source Oral, resp.  rate 17, height 5' 6"  (1.676 m), weight 155 lb 12.8 oz (70.7 kg), SpO2 99 %.  LABORATORY DATA: Lab Results  Component Value Date   WBC 4.9 04/24/2018   HGB 13.0 04/24/2018   HCT 39.0 04/24/2018   MCV 102.4 (H) 04/24/2018   PLT 99 (L) 04/24/2018      Chemistry      Component Value Date/Time   NA 142 04/24/2018 0733   NA 138 08/16/2017 1404   K 4.4 04/24/2018 0733   K 4.5 08/16/2017 1404   CL 109 04/24/2018 0733   CL 109 (H) 03/21/2013 0958   CO2 27 04/24/2018 0733   CO2 24 08/16/2017 1404   BUN 25 (H) 04/24/2018 0733   BUN 17.3 08/16/2017 1404   CREATININE 1.60 (H) 04/24/2018 0733   CREATININE 1.8 (H) 08/16/2017 1404      Component Value Date/Time   CALCIUM 9.7 04/24/2018 0733   CALCIUM 9.9 08/16/2017 1404   ALKPHOS 74 04/24/2018 0733   ALKPHOS 86 08/16/2017 1404   AST 11 (L) 04/24/2018 0733   AST 22 08/16/2017 1404   ALT 19 04/24/2018 0733   ALT 16 08/16/2017 1404   BILITOT 0.8 04/24/2018 0733   BILITOT 0.78 08/16/2017 1404       RADIOGRAPHIC STUDIES: No results found.  ASSESSMENT AND PLAN:  This is a very pleasant 83 years old white male with IgG smoldering  Loma diagnosed in April 2011. The patient has been on observation since 2011. His recent myeloma panel showed further increase in the free kappa light chain. His recent skeletal bone survey showed no concerning findings for disease progression.  He has a focus of avascular necrosis but no significant lytic lesions. The recent bone marrow biopsy and aspirate showed further increase in the plasma cells to 15%. The patient is currently undergoing treatment with subcutaneous weekly Velcade as well as Revlimid and Decadron status post 7 cycles. The patient has been tolerating this treatment well except for thrombocytopenia and fatigue. Repeat myeloma panel performed recently showed significant improvement of his disease with almost complete normalization of his free light chain. I discussed the lab results with  the patient gave him the option of continuous treatment with the same regimen versus taking a break of treatment for few months.  The patient his wife would like to take a break of treatment for the next few months and also to deal with his atrial fibrillation. I will see him back for follow-up visit in 3 months with repeat myeloma panel. He was advised to call immediately if he has any concerning symptoms in the interval. The patient voices understanding of current disease status and treatment options and is in agreement with the current care plan. All questions were answered. The patient knows to call the clinic with any problems, questions or concerns. We can certainly see the patient much sooner if necessary. I spent 15 minutes counseling the patient face to face. The total time spent in the appointment was 25 minutes.  Disclaimer: This note was dictated with voice recognition software. Similar sounding words can inadvertently be transcribed and may not be corrected upon review.

## 2018-04-30 NOTE — Telephone Encounter (Signed)
Scheduled appt per 7/29 los - gave patient aVS and calender per los. - cancelled all appts per los - per patient go ahead and cancel Nutrition consult as well.

## 2018-04-30 NOTE — Patient Instructions (Signed)
Medication Instructions:  Your physician recommends that you continue on your current medications as directed. Please refer to the Current Medication list given to you today.   Labwork: NONE ORDERED TODAY  Testing/Procedures: NONE ORDERED TODAY   Follow-Up: KEEP YOUR APPT WITH DR. Tamala Julian   Any Other Special Instructions Will Be Listed Below (If Applicable).     If you need a refill on your cardiac medications before your next appointment, please call your pharmacy.

## 2018-05-01 ENCOUNTER — Ambulatory Visit: Payer: Medicare Other

## 2018-05-01 ENCOUNTER — Other Ambulatory Visit: Payer: Medicare Other

## 2018-05-08 ENCOUNTER — Other Ambulatory Visit: Payer: Medicare Other

## 2018-05-08 ENCOUNTER — Ambulatory Visit: Payer: Medicare Other

## 2018-05-15 ENCOUNTER — Ambulatory Visit: Payer: Medicare Other | Admitting: Internal Medicine

## 2018-05-15 ENCOUNTER — Ambulatory Visit: Payer: Medicare Other

## 2018-05-15 ENCOUNTER — Other Ambulatory Visit: Payer: Medicare Other

## 2018-05-20 NOTE — Progress Notes (Signed)
Cardiology Office Note:    Date:  05/21/2018   ID:  Thomas [name], DOB October 06, 1934, MRN [id number]  PCP:  [name], MD  Cardiologist:  [name], MD   Referring MD: [name], MD   Chief Complaint  Patient presents with  . Atrial Fibrillation    History of Present Illness:    Thomas [name] is a 83 y.o. male with a hx of paroxysmal atrial fibrillation, chronic diastolic heart failure, hypertension, chronic kidney disease, unable to use anticoagulation therapy due to recurrent GU bleeding, and mild aortic stenosis.Other medical issues include metastatic prostate CA ([place]) and indolent multiple myeloma.Recent recurrent atrial fibrillation.  He is doing well.  Off amiodarone he feels unchanged.  No side effects from diltiazem.  He denies orthopnea, PND, lower extremity edema, and chest pain.  Past Medical History:  Diagnosis Date  . BPH (benign prostatic hypertrophy)   . Chronic diastolic heart failure ([place]) 12/27/2013  . Chronic kidney disease    Hx of bilateral renal cysts.  . Encounter for antineoplastic chemotherapy 02/20/2018  . Glaucoma   . Heart murmur   . History of atrial fibrillation 03/09/09  . Hx of epiglottitis    2011  . Hypercholesterolemia   . Hypertension   . Long term current use of amiodarone 11/07/2013   Amiodarone   . Mild aortic stenosis   . OSA on CPAP 06/26/2014  . Paroxysmal atrial fibrillation ([place]) 11/07/2013  . Prostate cancer ([place]) 7/12   T1C Ad Ca  s/p TURP, observation only  . Renal insufficiency 09/06/2011  . Sinus node dysfunction ([place]) 08/14/2014  . Sleep apnea   . Smoldering multiple myeloma ([place])     Past Surgical History:  Procedure Laterality Date  . ADRENALECTOMY  02/12/11   Left, for Pheochromocytoma  . CARDIOVERSION N/A 12/26/2013   Procedure: CARDIOVERSION;  Surgeon: [name], MD;  Location: [place];  Service: Cardiovascular;  Laterality: N/A;  . CHOLECYSTECTOMY  02/12/11  . GASTRECTOMY  1984   S/P gastrectomy for ulcer  . KNEE SURGERY  1971   S/P Right knee surgery  . TEE WITHOUT CARDIOVERSION N/A 12/26/2013   Procedure: TRANSESOPHAGEAL ECHOCARDIOGRAM (TEE);  Surgeon: [name], MD;  Location: [place];  Service: Cardiovascular;  Laterality: N/A;  . TRANSURETHRAL RESECTION OF PROSTATE  7/12    Current Medications: Current Meds  Medication Sig  . acetaminophen (TYLENOL) 500 MG tablet Take 1,000 mg by mouth at bedtime.  Marland Kitchen apixaban (ELIQUIS) 2.5 MG TABS tablet Take 1 tablet (2.5 mg total) by mouth 2 (two) times daily.  . brimonidine-timolol (COMBIGAN) 0.2-0.5 % ophthalmic solution Place 1 drop into both eyes 2 (two) times daily.   . Cholecalciferol (VITAMIN D3) 1000 UNITS CAPS Take 2 capsules by mouth daily.   Marland Kitchen diltiazem (DILTIAZEM CD) 120 MG 24 hr capsule Take 1 capsule (120 mg total) by mouth daily.  Marland Kitchen latanoprost (XALATAN) 0.005 % ophthalmic solution INSTILL 1 DROP INTO BOTH EYES EVERY DAY AT NIGHT  . levothyroxine (SYNTHROID, LEVOTHROID) 50 MCG tablet TAKE 1 TABLET BY MOUTH DAILY BEFORE BREAKFAST.  Marland Kitchen prochlorperazine (COMPAZINE) 10 MG tablet Take 1 tablet (10 mg total) by mouth every 6 (six) hours as needed for nausea or vomiting.  . [DISCONTINUED] apixaban (ELIQUIS) 2.5 MG TABS tablet Take 1 tablet (2.5 mg total) by mouth 2 (two) times daily.     Allergies:   Benadryl [diphenhydramine hcl]; Ceftriaxone sodium; Contrast media [iodinated diagnostic agents]; Nsaids; Penicillins; Levaquin [levofloxacin]; and Chlorhexidine  Social History   Socioeconomic History  . Marital status: Married    Spouse name: Not on file  . Number of children: 3  . Years of education: Not on file  . Highest education level: Not on file  Occupational History    Employer: RETIRED  Social Needs  . Financial resource strain: Not on file  . Food insecurity:    Worry: Not on file    Inability: Not on file  . Transportation needs:    Medical: Not on file    Non-medical: Not on file    Tobacco Use  . Smoking status: Never Smoker  . Smokeless tobacco: Never Used  Substance and Sexual Activity  . Alcohol use: No  . Drug use: No  . Sexual activity: Not Currently  Lifestyle  . Physical activity:    Days per week: Not on file    Minutes per session: Not on file  . Stress: Not on file  Relationships  . Social connections:    Talks on phone: Not on file    Gets together: Not on file    Attends religious service: Not on file    Active member of club or organization: Not on file    Attends meetings of clubs or organizations: Not on file    Relationship status: Not on file  Other Topics Concern  . Not on file  Social History Narrative  . Not on file     Family History: The patient's family history includes Kidney Stones in his brother; Liver disease in his sister, sister, and sister; Melanoma in his brother; Polymyositis in his mother; Prostate cancer in his brother and father.  ROS:   Please see the history of present illness.    Chemotherapy is now on hold for 3 months because light chain levels have significantly decreased in response to chemotherapy.  All other systems reviewed and are negative.  EKGs/Labs/Other Studies Reviewed:    The following studies were reviewed today: No new data  EKG:  EKG is not ordered today.    Recent Labs: 04/24/2018: ALT 19; BUN 25; Creatinine 1.60; Hemoglobin 13.0; Platelet Count 99; Potassium 4.4; Sodium 142  Recent Lipid Panel No results found for: CHOL, TRIG, HDL, CHOLHDL, VLDL, LDLCALC, LDLDIRECT  Physical Exam:    VS:  BP 134/76   Pulse 82   Ht _0  (1.676 m)   Wt 152 lb 6.4 oz (69.1 kg)   BMI 24.60 kg/m     Wt Readings from Last 3 Encounters:  05/21/18 152 lb 6.4 oz (69.1 kg)  04/30/18 155 lb 1.9 oz (70.4 kg)  04/30/18 155 lb 12.8 oz (70.7 kg)     GEN: Frail appearing.  Well developed in no acute distress HEENT: Normal NECK: No JVD. LYMPHATICS: No lymphadenopathy CARDIAC: IIRR, 2/6 systolic murmur, no  gallop, no  edema. VASCULAR: 2+ radial bilateral pulses.  No bruits. RESPIRATORY:  Clear to auscultation without rales, wheezing or rhonchi  ABDOMEN: Soft, non-tender, non-distended, No pulsatile mass, MUSCULOSKELETAL: No deformity  SKIN: Warm and dry NEUROLOGIC:  Alert and oriented x 3 PSYCHIATRIC:  Normal affect   ASSESSMENT:    1. Chronic anticoagulation   2. Chronic diastolic heart failure ([place])   3. Essential hypertension    PLAN:    In order of problems listed above:  1. Continuous/chronic atrial fibrillation.  Now with rate control on low-dose diltiazem.  Have asked the patient and family to continue to monitor heart rate on a daily basis and if  we develop consistent heart rates at rest greater than 95 to 100 bpm that additional up titration of diltiazem may be necessary.  They will let me know if they noticed this trend. 2. No evidence of volume overload. 3. Good blood pressure control on current medication regimen.  Follow-up in 6 months.  Call if increased heart rate greater than 95 bpm at rest.  Also call if blood pressures elevated above 140/90 chronically.   Medication Adjustments/Labs and Tests Ordered: Current medicines are reviewed at length with the patient today.  Concerns regarding medicines are outlined above.  No orders of the defined types were placed in this encounter.  Meds ordered this encounter  Medications  . apixaban (ELIQUIS) 2.5 MG TABS tablet    Sig: Take 1 tablet (2.5 mg total) by mouth 2 (two) times daily.    Dispense:  180 tablet    Refill:  3    Patient Instructions  Medication Instructions:  1. Your physician recommends that you continue on your current medications as directed. Please refer to the Current Medication list given to you today.   Labwork: NONE ORDERED TODAY  Testing/Procedures: NONE ORDERED TODAY  Follow-Up: DR. Tamala Julian IN 6 MONTHS   Any Other Special Instructions Will Be Listed Below (If Applicable).     If you  need a refill on your cardiac medications before your next appointment, please call your pharmacy.      Signed, [name], MD  05/21/2018 3:31 PM    [place]

## 2018-05-21 ENCOUNTER — Encounter: Payer: Self-pay | Admitting: Interventional Cardiology

## 2018-05-21 ENCOUNTER — Ambulatory Visit: Payer: Medicare Other | Admitting: Interventional Cardiology

## 2018-05-21 VITALS — BP 134/76 | HR 82 | Ht 66.0 in | Wt 152.4 lb

## 2018-05-21 DIAGNOSIS — Z7901 Long term (current) use of anticoagulants: Secondary | ICD-10-CM | POA: Diagnosis not present

## 2018-05-21 DIAGNOSIS — I5032 Chronic diastolic (congestive) heart failure: Secondary | ICD-10-CM | POA: Diagnosis not present

## 2018-05-21 DIAGNOSIS — I10 Essential (primary) hypertension: Secondary | ICD-10-CM | POA: Diagnosis not present

## 2018-05-21 MED ORDER — APIXABAN 2.5 MG PO TABS: 2.5000 mg | ORAL_TABLET | Freq: Two times a day (BID) | ORAL | 3 refills | Status: DC

## 2018-05-21 NOTE — Patient Instructions (Signed)
Medication Instructions:  1. Your physician recommends that you continue on your current medications as directed. Please refer to the Current Medication list given to you today.   Labwork: NONE ORDERED TODAY  Testing/Procedures: NONE ORDERED TODAY  Follow-Up: DR. Tamala Julian IN 6 MONTHS   Any Other Special Instructions Will Be Listed Below (If Applicable).     If you need a refill on your cardiac medications before your next appointment, please call your pharmacy.

## 2018-05-22 ENCOUNTER — Ambulatory Visit: Payer: Medicare Other

## 2018-05-22 ENCOUNTER — Other Ambulatory Visit: Payer: Medicare Other

## 2018-05-29 ENCOUNTER — Ambulatory Visit: Payer: Medicare Other

## 2018-05-29 ENCOUNTER — Other Ambulatory Visit: Payer: Medicare Other

## 2018-06-05 ENCOUNTER — Other Ambulatory Visit: Payer: Medicare Other

## 2018-06-05 ENCOUNTER — Ambulatory Visit: Payer: Medicare Other

## 2018-06-12 ENCOUNTER — Ambulatory Visit: Payer: Medicare Other | Admitting: Internal Medicine

## 2018-06-12 ENCOUNTER — Other Ambulatory Visit: Payer: Medicare Other

## 2018-06-12 ENCOUNTER — Ambulatory Visit: Payer: Medicare Other

## 2018-06-19 ENCOUNTER — Other Ambulatory Visit: Payer: Medicare Other

## 2018-06-19 ENCOUNTER — Ambulatory Visit: Payer: Medicare Other

## 2018-06-20 ENCOUNTER — Telehealth: Payer: Self-pay

## 2018-06-20 NOTE — Telephone Encounter (Signed)
Nutrition Follow-up:  Patient with multiple myeloma currently treatment is on hold.  Planning to follow back up with Dr. Julien Nordmann in October.    Called patient for nutrition follow-up.  Patient reports that taste are coming back and appetite is better since treatment has been held.  Reports that he is drinking boost shakes for added calories and protein.  Reports "I wish my wife was here she takes care of my food."  Medications: reviewed  Labs: reviewed  Anthropometrics:   Last weight taken 8/19 of 152 lb decreased from weight of 154 lb 11.2 oz on 7/16.     NUTRITION DIAGNOSIS: Inadequate oral intake improved per report   INTERVENTION:  Encouraged patient to continue oral nutrition supplements for added calories and protein Encouraged good sources of protein at every meal and examples provided.  Contact information given to patient and encouraged him to reach back out to RD for appointment if needed in the future.      MONITORING, EVALUATION, GOAL: Patient will consume adequate calories and protein to maintain weight   NEXT VISIT: patient to contact for future appointment if needed  Thomas [name] B. Zenia Resides, [place], [place] Registered Dietitian [phone] (pager)

## 2018-07-24 ENCOUNTER — Inpatient Hospital Stay: Payer: Medicare Other | Attending: Internal Medicine

## 2018-07-24 DIAGNOSIS — Z8546 Personal history of malignant neoplasm of prostate: Secondary | ICD-10-CM | POA: Diagnosis not present

## 2018-07-24 DIAGNOSIS — C9000 Multiple myeloma not having achieved remission: Secondary | ICD-10-CM | POA: Insufficient documentation

## 2018-07-24 DIAGNOSIS — I480 Paroxysmal atrial fibrillation: Secondary | ICD-10-CM | POA: Diagnosis not present

## 2018-07-24 DIAGNOSIS — Z7901 Long term (current) use of anticoagulants: Secondary | ICD-10-CM | POA: Diagnosis not present

## 2018-07-24 DIAGNOSIS — Z79899 Other long term (current) drug therapy: Secondary | ICD-10-CM | POA: Diagnosis not present

## 2018-07-24 LAB — CMP (CANCER CENTER ONLY)
ALT: 9 U/L (ref 0–44)
AST: 13 U/L — ABNORMAL LOW (ref 15–41)
Albumin: 3.9 g/dL (ref 3.5–5.0)
Alkaline Phosphatase: 105 U/L (ref 38–126)
Anion gap: 12 (ref 5–15)
BUN: 22 mg/dL (ref 8–23)
CO2: 25 mmol/L (ref 22–32)
Calcium: 10.5 mg/dL — ABNORMAL HIGH (ref 8.9–10.3)
Chloride: 107 mmol/L (ref 98–111)
Creatinine: 1.89 mg/dL — ABNORMAL HIGH (ref 0.61–1.24)
GFR, Est AFR Am: 37 mL/min — ABNORMAL LOW (ref >60)
GFR, Estimated: 32 mL/min — ABNORMAL LOW (ref >60)
Glucose, Bld: 97 mg/dL (ref 70–99)
Potassium: 4.3 mmol/L (ref 3.5–5.1)
Sodium: 144 mmol/L (ref 135–145)
Total Bilirubin: 0.6 mg/dL (ref 0.3–1.2)
Total Protein: 6 g/dL — ABNORMAL LOW (ref 6.5–8.1)

## 2018-07-24 LAB — CBC WITH DIFFERENTIAL (CANCER CENTER ONLY)
Abs Immature Granulocytes: 0.03 10*3/uL (ref 0.00–0.07)
Basophils Absolute: 0 10*3/uL (ref 0.0–0.1)
Basophils Relative: 0 %
Eosinophils Absolute: 0.1 10*3/uL (ref 0.0–0.5)
Eosinophils Relative: 1 %
HCT: 44.6 % (ref 39.0–52.0)
Hemoglobin: 15.3 g/dL (ref 13.0–17.0)
Immature Granulocytes: 0 %
Lymphocytes Relative: 20 %
Lymphs Abs: 1.6 10*3/uL (ref 0.7–4.0)
MCH: 32.3 pg (ref 26.0–34.0)
MCHC: 34.3 g/dL (ref 30.0–36.0)
MCV: 94.3 fL (ref 80.0–100.0)
Monocytes Absolute: 0.6 10*3/uL (ref 0.1–1.0)
Monocytes Relative: 8 %
Neutro Abs: 5.6 10*3/uL (ref 1.7–7.7)
Neutrophils Relative %: 71 %
Platelet Count: 155 10*3/uL (ref 150–400)
RBC: 4.73 MIL/uL (ref 4.22–5.81)
RDW: 12.4 % (ref 11.5–15.5)
WBC Count: 8 10*3/uL (ref 4.0–10.5)
nRBC: 0 % (ref 0.0–0.2)

## 2018-07-24 LAB — LACTATE DEHYDROGENASE: LDH: 149 U/L (ref 98–192)

## 2018-07-25 LAB — KAPPA/LAMBDA LIGHT CHAINS
Kappa free light chain: 19.7 mg/L — ABNORMAL HIGH (ref 3.3–19.4)
Kappa, lambda light chain ratio: 1.53 (ref 0.26–1.65)
Lambda free light chains: 12.9 mg/L (ref 5.7–26.3)

## 2018-07-25 LAB — IGG, IGA, IGM
IgA: 42 mg/dL — ABNORMAL LOW (ref 61–437)
IgG (Immunoglobin G), Serum: 500 mg/dL — ABNORMAL LOW (ref 700–1600)
IgM (Immunoglobulin M), Srm: 21 mg/dL (ref 15–143)

## 2018-07-25 LAB — BETA 2 MICROGLOBULIN, SERUM: Beta-2 Microglobulin: 3.3 mg/L — ABNORMAL HIGH (ref 0.6–2.4)

## 2018-07-26 ENCOUNTER — Ambulatory Visit: Payer: Medicare Other | Admitting: Cardiovascular Disease

## 2018-07-26 ENCOUNTER — Encounter: Payer: Self-pay | Admitting: Cardiovascular Disease

## 2018-07-26 VITALS — BP 108/66 | HR 84 | Ht 65.0 in | Wt 148.4 lb

## 2018-07-26 DIAGNOSIS — I4819 Other persistent atrial fibrillation: Secondary | ICD-10-CM

## 2018-07-26 DIAGNOSIS — G4733 Obstructive sleep apnea (adult) (pediatric): Secondary | ICD-10-CM | POA: Diagnosis not present

## 2018-07-26 DIAGNOSIS — I350 Nonrheumatic aortic (valve) stenosis: Secondary | ICD-10-CM | POA: Diagnosis not present

## 2018-07-26 DIAGNOSIS — Z7901 Long term (current) use of anticoagulants: Secondary | ICD-10-CM

## 2018-07-26 DIAGNOSIS — Z9989 Dependence on other enabling machines and devices: Secondary | ICD-10-CM

## 2018-07-26 DIAGNOSIS — R634 Abnormal weight loss: Secondary | ICD-10-CM

## 2018-07-26 NOTE — Patient Instructions (Signed)
Follow-Up: At [facility], you and your health needs are our priority.  As part of our continuing mission to provide you with exceptional heart care, we have created designated Provider Care Teams.  These Care Teams include your primary Cardiologist (physician) and Advanced Practice Providers (APPs -  Physician Assistants and Nurse Practitioners) who all work together to provide you with the care you need, when you need it. . 1 year with Dr. Kelly (sleep clinic)    

## 2018-07-26 NOTE — Progress Notes (Signed)
Patient ID: Thomas [name], male   DOB: 30-Jun-1935, 83 y.o.   MRN: [id number]     HPI: Thomas [name], is a 83 y.o. male who is followed by Dr. Tamala [name] for cardiology care.  I saw him  in September 2015 for sleep clinic evaluation following initiation of CPAP therapy and follow-up in February 2018.  He presents now for 58-monthfollow-up evaluation  Mr ZClenneyhas a history of paroxysmal atrial fibrillation and  has been maintaining sinus rhythm with sinus bradycardia.  He has a history of hypertension, hyperlipidemia, documented bilateral renal cysts, and mild aortic valve stenosis, and multiple myeloma.  Due to concerns for obstructive sleep apnea, particularly with his atrial fibrillation, history he was referred by Dr. STamala Julianfor a diagnostic polysomnogram in 2015.   His diagnostic polysomnogram was done at [facility] 02[facility]/2014 revealed severe obstructive sleep apnea with an AHI of 45.7 per hour.  He had oxygen desaturation to 86% and there was evidence for moderate snoring.  There were frequent periodic limb movements with an index of 36 per hour with 20.3 per hour to arousal.  He subsequently was referred for a CPAP titration in a CPAP pressure of 9 cm was recommended.  Of note, there was significant improvement in his periodic leg movements with CPAP therapy.  A download from 05/24/2014 to 06/22/2014 showed excellent compliance with 100% days of usage.  He had 97% days with use greater than 4 hours and was meeting Medicare compliance standards.  He has an AirSense 10 AutoSet unit and is at a set pressure of 9 cm.  His AHI is now excellent at 1.2.  There is no leak.  He has a RSpecial educational needs teacher fullface mask (medium).  When I saw him in February 2018 he was 100% compliant with CPAP.  Download  from 10/23/2016 through 11/21/2016, which confirmed 100% compliance.  CPAP was set at 9 cm set pressure; AHI was 1.0.  He was using a full facemask.  His nocturia had  significantly improved with CPAP treatment. His Epworth Sleepiness Scale score is improved and was 6 compared to previously being elevated  Epworth Sleepiness Scale: Situation   Chance of Dozing/Sleeping (0 = never , 1 = slight chance , 2 = moderate chance , 3 = high chance )   sitting and reading 2   watching TV 1   sitting inactive in a public place 0   being a passenger in a motor vehicle for an hour or more 0   lying down in the afternoon 3   sitting and talking to someone 0   sitting quietly after lunch (no alcohol) 0   while stopped for a few minutes in traffic as the driver 0   Total Score  6   Since I last saw him, he continues to be followed by Dr. STamala Julianfor his paroxysmal atrial fibrillation, chronic diastolic heart failure, hypertension, chronic kidney disease and mild aortic stenosis.  Remotely he was not on anticoagulation secondary to  recurrent GU bleeding, but currently is on low-dose Eliquis.  He has undergone chemotherapy for multiple myeloma as well as treatment for prostate CA.  He states that he has lost 49 pounds with his recent illnesses.  His primary physician question whether or not he still needed to use CPAP therapy.  In the office today I calculated a new Epworth Sleepiness Scale score and this endorsed at 11 and was consistent with mild daytime sleepiness.  He has  a ResMed air sense 10 AutoSet unit and has been set at a 9 cm pressure.  He has been using an old air fit F10 full facemask and has had difficulty with pressure on the bridge of his nose.  A download from June 25, 2018 through July 24, 2018 shows 100% compliance.  He is averaging 6 hours and 26 minutes of CPAP use.  AHI is 3.1.  There is no leak.  He presents for reevaluation.  Past Medical History:  Diagnosis Date  . BPH (benign prostatic hypertrophy)   . Chronic diastolic heart failure ([place]) 12/27/2013  . Chronic kidney disease    Hx of bilateral renal cysts.  . Encounter for antineoplastic  chemotherapy 02/20/2018  . Glaucoma   . Heart murmur   . History of atrial fibrillation 03/09/09  . Hx of epiglottitis    2011  . Hypercholesterolemia   . Hypertension   . Long term current use of amiodarone 11/07/2013   Amiodarone   . Mild aortic stenosis   . OSA on CPAP 06/26/2014  . Paroxysmal atrial fibrillation ([place]) 11/07/2013  . Prostate cancer ([place]) 7/12   T1C Ad Ca  s/p TURP, observation only  . Renal insufficiency 09/06/2011  . Sinus node dysfunction ([place]) 08/14/2014  . Sleep apnea   . Smoldering multiple myeloma ([place])     Past Surgical History:  Procedure Laterality Date  . ADRENALECTOMY  02/12/11   Left, for Pheochromocytoma  . CARDIOVERSION N/A 12/26/2013   Procedure: CARDIOVERSION;  Surgeon: Thomas Records, MD;  Location: [facility];  Service: Cardiovascular;  Laterality: N/A;  . CHOLECYSTECTOMY  02/12/11  . GASTRECTOMY  1984   S/P gastrectomy for ulcer  . KNEE SURGERY  1971   S/P Right knee surgery  . TEE WITHOUT CARDIOVERSION N/A 12/26/2013   Procedure: TRANSESOPHAGEAL ECHOCARDIOGRAM (TEE);  Surgeon: Thomas Records, MD;  Location: [place];  Service: Cardiovascular;  Laterality: N/A;  . TRANSURETHRAL RESECTION OF PROSTATE  7/12    Allergies  Allergen Reactions  . Benadryl [Diphenhydramine Hcl] Other (See Comments)    Bad dreams -  Hallucinations.  . Ceftriaxone Sodium Hives  . Contrast Media [Iodinated Diagnostic Agents] Other (See Comments)    Poor kidney function  . Nsaids Other (See Comments)    REACTION: BLEEDING ULCER  . Penicillins Hives  . Levaquin [Levofloxacin]   . Chlorhexidine Rash    Use alcohol for skin prep per patient request.     Current Outpatient Medications  Medication Sig Dispense Refill  . acetaminophen (TYLENOL) 500 MG tablet Take 1,000 mg by mouth at bedtime.    Marland Kitchen apixaban (ELIQUIS) 2.5 MG TABS tablet Take 1 tablet (2.5 mg total) by mouth 2 (two) times daily. 180 tablet 3  . brimonidine-timolol (COMBIGAN) 0.2-0.5 % ophthalmic  solution Place 1 drop into both eyes 2 (two) times daily.     . Cholecalciferol (VITAMIN D3) 1000 UNITS CAPS Take 2 capsules by mouth daily.     Marland Kitchen diltiazem (DILTIAZEM CD) 120 MG 24 hr capsule Take 1 capsule (120 mg total) by mouth daily. 90 capsule 1  . latanoprost (XALATAN) 0.005 % ophthalmic solution INSTILL 1 DROP INTO BOTH EYES EVERY DAY AT NIGHT  6   No current facility-administered medications for this visit.     Social History   Socioeconomic History  . Marital status: Married    Spouse name: Not on file  . Number of children: 3  . Years of education: Not on file  . Highest education  level: Not on file  Occupational History    Employer: RETIRED  Social Needs  . Financial resource strain: Not on file  . Food insecurity:    Worry: Not on file    Inability: Not on file  . Transportation needs:    Medical: Not on file    Non-medical: Not on file  Tobacco Use  . Smoking status: Never Smoker  . Smokeless tobacco: Never Used  Substance and Sexual Activity  . Alcohol use: No  . Drug use: No  . Sexual activity: Not Currently  Lifestyle  . Physical activity:    Days per week: Not on file    Minutes per session: Not on file  . Stress: Not on file  Relationships  . Social connections:    Talks on phone: Not on file    Gets together: Not on file    Attends religious service: Not on file    Active member of club or organization: Not on file    Attends meetings of clubs or organizations: Not on file    Relationship status: Not on file  . Intimate partner violence:    Fear of current or ex partner: Not on file    Emotionally abused: Not on file    Physically abused: Not on file    Forced sexual activity: Not on file  Other Topics Concern  . Not on file  Social History Narrative  . Not on file     ROS General: Negative; No fevers, chills, or night sweats HEENT: Negative; No changes in vision or hearing, sinus congestion, difficulty swallowing Pulmonary: Negative; No  cough, wheezing, shortness of breath, hemoptysis Cardiovascular: Positive for PAF No chest pain, presyncope, syncope, palpatations GI: Negative; No nausea, vomiting, diarrhea, or abdominal pain GU: Negative; No dysuria, hematuria, or difficulty voiding Musculoskeletal: Negative; no myalgias, joint pain, or weakness Hematologic: Positive for multiple myeloma; and prostate CA Endocrine: Negative; no heat/cold intolerance Neuro: Negative; no changes in balance, headaches Skin: Negative; No rashes or skin lesions Psychiatric: Negative; No behavioral problems, depression Sleep: Positive for OSA on CPAP; No daytime sleepiness, hypersomnolence, bruxism, restless legs, hypnogognic hallucinations, no cataplexy   Physical Exam BP 108/66   Pulse 84   Ht 5' 5"  (1.651 m)   Wt 148 lb 6.4 oz (67.3 kg)   BMI 24.70 kg/m    Repeat blood pressure by me 112/70  Wt Readings from Last 3 Encounters:  07/26/18 148 lb 6.4 oz (67.3 kg)  05/21/18 152 lb 6.4 oz (69.1 kg)  04/30/18 155 lb 1.9 oz (70.4 kg)    General: Alert, oriented, no distress.  Skin: normal turgor, no rashes, warm and dry HEENT: Normocephalic, atraumatic. Pupils equal round and reactive to light; sclera anicteric; extraocular muscles intact;  Nose without nasal septal hypertrophy Mouth/Parynx benign; Mallinpatti scale 3 Neck: No JVD, no carotid bruits; normal carotid upstroke Lungs: clear to ausculatation and percussion; no wheezing or rales Chest wall: without tenderness to palpitation Heart: PMI not displaced, irregularly irregular, s1 s2 normal, 2/6 systolic murmur, no diastolic murmur, no rubs, gallops, thrills, or heaves Abdomen: soft, nontender; no hepatosplenomehaly, BS+; abdominal aorta nontender and not dilated by palpation. Back: no CVA tenderness Pulses 2+ Musculoskeletal: full range of motion, normal strength, no joint deformities Extremities: no clubbing cyanosis or edema, Homan's sign negative  Neurologic: grossly  nonfocal; Cranial nerves grossly wnl Psychologic: Normal mood and affect   ECG (independently read by me): Atrial fibrillation at 84 bpm.  QTc interval 460 ms.  February 2018 ECG (independently read by me): Sinus bradycardia at 59 bpm with first-degree AV block (PR interval 21 6/22).  QTc interval 451 ms.  LABS:  BMP Latest Ref Rng & Units 07/24/2018 04/24/2018 04/17/2018  Glucose 70 - 99 mg/dL 97 97 112(H)  BUN 8 - 23 mg/dL 22 25(H) 16  Creatinine 0.61 - 1.24 mg/dL 1.89(H) 1.60(H) 1.54(H)  Sodium 135 - 145 mmol/L 144 142 142  Potassium 3.5 - 5.1 mmol/L 4.3 4.4 4.3  Chloride 98 - 111 mmol/L 107 109 108  CO2 22 - 32 mmol/L 25 27 28   Calcium 8.9 - 10.3 mg/dL 10.5(H) 9.7 9.6   Hepatic Function Latest Ref Rng & Units 07/24/2018 04/24/2018 04/17/2018  Total Protein 6.5 - 8.1 g/dL 6.0(L) 5.2(L) 5.2(L)  Albumin 3.5 - 5.0 g/dL 3.9 3.4(L) 3.5  AST 15 - 41 U/L 13(L) 11(L) 11(L)  ALT 0 - 44 U/L 9 19 21   Alk Phosphatase 38 - 126 U/L 105 74 69  Total Bilirubin 0.3 - 1.2 mg/dL 0.6 0.8 0.8  Bilirubin, Direct <=0.2 mg/dL - - -   CBC Latest Ref Rng & Units 07/24/2018 04/24/2018 04/17/2018  WBC 4.0 - 10.5 K/uL 8.0 4.9 3.2(L)  Hemoglobin 13.0 - 17.0 g/dL 15.3 13.0 13.0  Hematocrit 39.0 - 52.0 % 44.6 39.0 38.5  Platelets 150 - 400 K/uL 155 99(L) 45(L)   Lab Results  Component Value Date   TSH 4.500 03/29/2017   Lab Results  Component Value Date   HGBA1C 5.4 12/25/2013   Lipid Panel  No results found for: CHOL, TRIG, HDL, CHOLHDL, VLDL, LDLCALC, LDLDIRECT   RADIOLOGY: No results found.  IMPRESSION:  1. OSA on CPAP   2. Persistent atrial fibrillation   3. Chronic anticoagulation   4. Mild aortic stenosis   5. Weight loss     ASSESSMENT AND PLAN: Mr. Meliton Rattan Is an 83 year old male who has a history of atrial fibrillation, previous documentation of chronic diastolic heart failure, hypertension, chronic kidney disease, and mild aortic stenosis.  He has been documented to have severe  obstructive sleep apnea with an AHI of 45.7 and has been on CPAP therapy since 03/26/2014.   He had significant periodic leg movements on the baseline study, but these significantly improved with CPAP initiation.  He has had issues with his left knee and often moves his legs for comfort but denies any painful restless legs.  Since I last saw him in February 2018 his weight has reduced from 196 pounds to his current weight of 148 pounds.  I discussed with him it is very likely that his sleep apnea severity has improved but I anticipate due to the severe sleep apnea that he had prior to weight loss that he still has residual sleep apnea and with his cardiovascular comorbidities including atrial fibrillation that he should continue CPAP therapy.  He continues to note improvement although on his Epworth scale today he is mildly sleepy.  His ECG today shows atrial fibrillation and he tells me his AF is now chronic.  He is now on low-dose Eliquis anticoagulation.  He has had difficulty with a full facemask on the bridge of the nose and for this reason I will switch him to a ResMed air fit F 30 full facemask and there will be no part of the mask on his nose with delivery of nasal and oral pressure with a "DreamWear "technology.  We discussed perhaps increasing his sleep duration to 7 to 8 hours since his average usage of  CPAP is just under 6 hours and 30 minutes.  I will see him in 1 year for reevaluation or sooner if problems arise.  Time spent: 25 minutes  Troy Sine, MD, [facility]  07/28/2018 11:22 AM

## 2018-07-28 ENCOUNTER — Encounter: Payer: Self-pay | Admitting: Cardiovascular Disease

## 2018-07-31 ENCOUNTER — Encounter: Payer: Self-pay | Admitting: Internal Medicine

## 2018-07-31 ENCOUNTER — Inpatient Hospital Stay: Payer: Medicare Other | Admitting: Internal Medicine

## 2018-07-31 ENCOUNTER — Telehealth: Payer: Self-pay | Admitting: Internal Medicine

## 2018-07-31 VITALS — BP 140/79 | HR 83 | Temp 97.9°F | Resp 16 | Ht 65.0 in | Wt 148.5 lb

## 2018-07-31 DIAGNOSIS — C9000 Multiple myeloma not having achieved remission: Secondary | ICD-10-CM | POA: Diagnosis not present

## 2018-07-31 DIAGNOSIS — I480 Paroxysmal atrial fibrillation: Secondary | ICD-10-CM | POA: Diagnosis not present

## 2018-07-31 DIAGNOSIS — Z7901 Long term (current) use of anticoagulants: Secondary | ICD-10-CM | POA: Diagnosis not present

## 2018-07-31 DIAGNOSIS — Z79899 Other long term (current) drug therapy: Secondary | ICD-10-CM | POA: Diagnosis not present

## 2018-07-31 DIAGNOSIS — Z8546 Personal history of malignant neoplasm of prostate: Secondary | ICD-10-CM

## 2018-07-31 NOTE — Progress Notes (Signed)
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 2) History of pheochromocytoma LEFT adrenal gland status post resection on 03/16/2009 3) Prostate adenocarcinoma  PRIOR THERAPY: Systemic therapy with weekly Velcade 1.3 mg/KG subcutaneously, Revlimid 25 mg p.o. daily for 21 days every 4 weeks as well as Decadron 20 mg p.o. weekly.  First dose November 07, 2017.  Status post 7 cycles.   CURRENT THERAPY: Observation.  INTERVAL HISTORY: Thomas [name] 83 y.o. male returns to the clinic today for follow-up visit accompanied by his wife.  The patient is feeling fine today with no concerning complaints.  He denied having any significant fatigue or weakness.  He denied having any chest pain, shortness breath, cough or hemoptysis.  He has no nausea, vomiting, diarrhea or constipation.  He denied having any weight loss or night sweats.  He had repeat myeloma panel performed recently and he is here for evaluation and discussion of his lab results.  MEDICAL HISTORY: Past Medical History:  Diagnosis Date  . BPH (benign prostatic hypertrophy)   . Chronic diastolic heart failure ([facility]) 12/27/2013  . Chronic kidney disease    Hx of bilateral renal cysts.  . Encounter for antineoplastic chemotherapy 02/20/2018  . Glaucoma   . Heart murmur   . History of atrial fibrillation 03/09/09  . Hx of epiglottitis    2011  . Hypercholesterolemia   . Hypertension   . Long term current use of amiodarone 11/07/2013   Amiodarone   . Mild aortic stenosis   . OSA on CPAP 06/26/2014  . Paroxysmal atrial fibrillation ([facility]) 11/07/2013  . Prostate cancer ([facility]) 7/12   T1C Ad Ca  s/p TURP, observation only  . Renal insufficiency 09/06/2011  . Sinus node dysfunction ([facility])  08/14/2014  . Sleep apnea   . 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  MEDICATIONS:  Current Outpatient Medications  Medication Sig Dispense Refill  . acetaminophen (TYLENOL) 500 MG tablet Take 1,000 mg by mouth at bedtime.    Marland Kitchen apixaban (ELIQUIS) 2.5 MG TABS tablet Take 1 tablet (2.5 mg total) by mouth 2 (two) times daily. 180 tablet 3  . brimonidine-timolol (COMBIGAN) 0.2-0.5 % ophthalmic solution Place 1 drop into both eyes 2 (two) times daily.     . Cholecalciferol (VITAMIN D3) 1000 UNITS CAPS Take 2 capsules by mouth daily.     Marland Kitchen diltiazem (DILTIAZEM CD) 120 MG 24 hr capsule Take 1 capsule (120 mg total) by mouth daily. 90 capsule 1  . latanoprost (XALATAN) 0.005 % ophthalmic solution INSTILL 1 DROP INTO BOTH EYES EVERY DAY AT NIGHT  6   No current facility-administered medications for this visit.     SURGICAL HISTORY:  Past Surgical History:  Procedure Laterality Date  . ADRENALECTOMY  02/12/11   Left, for Pheochromocytoma  . CARDIOVERSION N/A 12/26/2013   Procedure: CARDIOVERSION;  Surgeon: [name], MD;  Location: [facility];  Service: Cardiovascular;  Laterality: N/A;  . CHOLECYSTECTOMY  02/12/11  . GASTRECTOMY  1984   S/P gastrectomy for ulcer  . KNEE SURGERY  1971   S/P Right knee surgery  . TEE WITHOUT CARDIOVERSION  N/A 12/26/2013   Procedure: TRANSESOPHAGEAL ECHOCARDIOGRAM (TEE);  Surgeon: [name], MD;  Location: [facility];  Service: Cardiovascular;  Laterality: N/A;  . TRANSURETHRAL RESECTION OF PROSTATE  7/12    REVIEW OF SYSTEMS:  A comprehensive review of systems was negative.   PHYSICAL EXAMINATION: General appearance: alert, cooperative and no distress Head: Normocephalic, without obvious abnormality, atraumatic Neck: no adenopathy, no JVD, supple, symmetrical,  trachea midline and thyroid not enlarged, symmetric, no tenderness/mass/nodules Lymph nodes: Cervical, supraclavicular, and axillary nodes normal. Resp: clear to auscultation bilaterally Back: symmetric, no curvature. ROM normal. No CVA tenderness. Cardio: regular rate and rhythm, S1, S2 normal, no murmur, click, rub or gallop GI: soft, non-tender; bowel sounds normal; no masses,  no organomegaly Extremities: extremities normal, atraumatic, no cyanosis or edema  ECOG PERFORMANCE STATUS: 1 - Symptomatic but completely ambulatory  Blood pressure 140/79, pulse 83, temperature 97.9 F (36.6 C), temperature source Oral, resp. rate 16, height 5' 5"  (1.651 m), weight 148 lb 8 oz (67.4 kg), SpO2 100 %.  LABORATORY DATA: Lab Results  Component Value Date   WBC 8.0 07/24/2018   HGB 15.3 07/24/2018   HCT 44.6 07/24/2018   MCV 94.3 07/24/2018   PLT 155 07/24/2018      Chemistry      Component Value Date/Time   NA 144 07/24/2018 0730   NA 138 08/16/2017 1404   K 4.3 07/24/2018 0730   K 4.5 08/16/2017 1404   CL 107 07/24/2018 0730   CL 109 (H) 03/21/2013 0958   CO2 25 07/24/2018 0730   CO2 24 08/16/2017 1404   BUN 22 07/24/2018 0730   BUN 17.3 08/16/2017 1404   CREATININE 1.89 (H) 07/24/2018 0730   CREATININE 1.8 (H) 08/16/2017 1404      Component Value Date/Time   CALCIUM 10.5 (H) 07/24/2018 0730   CALCIUM 9.9 08/16/2017 1404   ALKPHOS 105 07/24/2018 0730   ALKPHOS 86 08/16/2017 1404   AST 13 (L) 07/24/2018 0730   AST 22 08/16/2017 1404   ALT 9 07/24/2018 0730   ALT 16 08/16/2017 1404   BILITOT 0.6 07/24/2018 0730   BILITOT 0.78 08/16/2017 1404       RADIOGRAPHIC STUDIES: No results found.  ASSESSMENT AND PLAN:  This is a very pleasant 83 years old white male with IgG smoldering Loma diagnosed in April 2011. The patient has been on observation since 2011. His recent myeloma panel showed further increase in the free kappa light chain. His recent skeletal bone survey  showed no concerning findings for disease progression.  He has a focus of avascular necrosis but no significant lytic lesions. The recent bone marrow biopsy and aspirate showed further increase in the plasma cells to 15%. The patient underwent treatment with subcutaneous weekly Velcade as well as Revlimid and Decadron status post 7 cycles. He tolerated his previous treatment well with significant improvement of his disease.  He is currently on observation. Repeat myeloma panel performed recently showed no concerning findings for disease progression. I discussed the lab results with the patient and his wife today.  I recommended for him to continue on observation with repeat myeloma panel in 3 months. The patient was advised to call immediately if he has any concerning symptoms in the interval. The patient voices understanding of current disease status and treatment options and is in agreement with the current care plan. All questions were answered. The patient knows to call the clinic with any problems, questions or concerns. We can certainly see  the patient much sooner if necessary. I spent 10 minutes counseling the patient face to face. The total time spent in the appointment was 15 minutes.  Disclaimer: This note was dictated with voice recognition software. Similar sounding words can inadvertently be transcribed and may not be corrected upon review.

## 2018-07-31 NOTE — Telephone Encounter (Signed)
Gave pt avs and calendar  °

## 2018-09-20 ENCOUNTER — Other Ambulatory Visit: Payer: Self-pay | Admitting: Interventional Cardiology

## 2018-09-20 NOTE — Telephone Encounter (Signed)
Pt's pharmacy is requesting a refill on Levothyroxine. This medication was D/C. I do not see that a provider D/C this medication. Please clarify if pt is still suppose to be taking this medication.

## 2018-09-20 NOTE — Telephone Encounter (Signed)
Spoke with pt and he states PCP took him off of Synthroid about 6 weeks ago.  Labs rechecked a few days ago and were fine so PCP told him to continue to stay off of it.  Ok to decline medication with note stating pt no longer taking.

## 2018-09-21 NOTE — Telephone Encounter (Signed)
He is not on this medication, as best I can tell.

## 2018-09-30 ENCOUNTER — Other Ambulatory Visit: Payer: Self-pay | Admitting: Interventional Cardiology

## 2018-10-23 ENCOUNTER — Inpatient Hospital Stay: Payer: Medicare Other | Attending: Internal Medicine

## 2018-10-23 DIAGNOSIS — Z8546 Personal history of malignant neoplasm of prostate: Secondary | ICD-10-CM | POA: Insufficient documentation

## 2018-10-23 DIAGNOSIS — I480 Paroxysmal atrial fibrillation: Secondary | ICD-10-CM | POA: Insufficient documentation

## 2018-10-23 DIAGNOSIS — C9000 Multiple myeloma not having achieved remission: Secondary | ICD-10-CM | POA: Diagnosis not present

## 2018-10-23 DIAGNOSIS — Z79899 Other long term (current) drug therapy: Secondary | ICD-10-CM | POA: Insufficient documentation

## 2018-10-23 DIAGNOSIS — Z7901 Long term (current) use of anticoagulants: Secondary | ICD-10-CM | POA: Insufficient documentation

## 2018-10-23 DIAGNOSIS — G629 Polyneuropathy, unspecified: Secondary | ICD-10-CM | POA: Diagnosis not present

## 2018-10-23 DIAGNOSIS — M25561 Pain in right knee: Secondary | ICD-10-CM | POA: Diagnosis not present

## 2018-10-23 LAB — CMP (CANCER CENTER ONLY)
ALT: 10 U/L (ref 0–44)
AST: 16 U/L (ref 15–41)
Albumin: 4.1 g/dL (ref 3.5–5.0)
Alkaline Phosphatase: 106 U/L (ref 38–126)
Anion gap: 9 (ref 5–15)
BUN: 23 mg/dL (ref 8–23)
CO2: 26 mmol/L (ref 22–32)
Calcium: 10.2 mg/dL (ref 8.9–10.3)
Chloride: 108 mmol/L (ref 98–111)
Creatinine: 1.64 mg/dL — ABNORMAL HIGH (ref 0.61–1.24)
GFR, Est AFR Am: 45 mL/min — ABNORMAL LOW (ref >60)
GFR, Estimated: 39 mL/min — ABNORMAL LOW (ref >60)
Glucose, Bld: 86 mg/dL (ref 70–99)
Potassium: 4.4 mmol/L (ref 3.5–5.1)
Sodium: 143 mmol/L (ref 135–145)
Total Bilirubin: 0.8 mg/dL (ref 0.3–1.2)
Total Protein: 6.4 g/dL — ABNORMAL LOW (ref 6.5–8.1)

## 2018-10-23 LAB — CBC WITH DIFFERENTIAL (CANCER CENTER ONLY)
Abs Immature Granulocytes: 0.03 10*3/uL (ref 0.00–0.07)
Basophils Absolute: 0 10*3/uL (ref 0.0–0.1)
Basophils Relative: 0 %
Eosinophils Absolute: 0.1 10*3/uL (ref 0.0–0.5)
Eosinophils Relative: 1 %
HCT: 49.2 % (ref 39.0–52.0)
Hemoglobin: 16.5 g/dL (ref 13.0–17.0)
Immature Granulocytes: 1 %
Lymphocytes Relative: 25 %
Lymphs Abs: 1.6 10*3/uL (ref 0.7–4.0)
MCH: 31.1 pg (ref 26.0–34.0)
MCHC: 33.5 g/dL (ref 30.0–36.0)
MCV: 92.8 fL (ref 80.0–100.0)
Monocytes Absolute: 0.5 10*3/uL (ref 0.1–1.0)
Monocytes Relative: 8 %
Neutro Abs: 4.2 10*3/uL (ref 1.7–7.7)
Neutrophils Relative %: 65 %
Platelet Count: 166 10*3/uL (ref 150–400)
RBC: 5.3 MIL/uL (ref 4.22–5.81)
RDW: 13.2 % (ref 11.5–15.5)
WBC Count: 6.5 10*3/uL (ref 4.0–10.5)
nRBC: 0 % (ref 0.0–0.2)

## 2018-10-23 LAB — LACTATE DEHYDROGENASE: LDH: 141 U/L (ref 98–192)

## 2018-10-26 LAB — KAPPA/LAMBDA LIGHT CHAINS
Kappa free light chain: 19.9 mg/L — ABNORMAL HIGH (ref 3.3–19.4)
Kappa, lambda light chain ratio: 1.72 — ABNORMAL HIGH (ref 0.26–1.65)
Lambda free light chains: 11.6 mg/L (ref 5.7–26.3)

## 2018-10-26 LAB — IGG, IGA, IGM
IgA: 51 mg/dL — ABNORMAL LOW (ref 61–437)
IgG (Immunoglobin G), Serum: 614 mg/dL — ABNORMAL LOW (ref 700–1600)
IgM (Immunoglobulin M), Srm: 24 mg/dL (ref 15–143)

## 2018-10-26 LAB — BETA 2 MICROGLOBULIN, SERUM: Beta-2 Microglobulin: 2.7 mg/L — ABNORMAL HIGH (ref 0.6–2.4)

## 2018-10-30 ENCOUNTER — Encounter: Payer: Self-pay | Admitting: Internal Medicine

## 2018-10-30 ENCOUNTER — Inpatient Hospital Stay: Payer: Medicare Other | Admitting: Internal Medicine

## 2018-10-30 ENCOUNTER — Telehealth: Payer: Self-pay | Admitting: Internal Medicine

## 2018-10-30 VITALS — BP 128/72 | HR 84 | Temp 98.2°F | Resp 18 | Ht 65.0 in | Wt 153.6 lb

## 2018-10-30 DIAGNOSIS — C9000 Multiple myeloma not having achieved remission: Secondary | ICD-10-CM

## 2018-10-30 DIAGNOSIS — M25561 Pain in right knee: Secondary | ICD-10-CM

## 2018-10-30 DIAGNOSIS — Z8546 Personal history of malignant neoplasm of prostate: Secondary | ICD-10-CM

## 2018-10-30 DIAGNOSIS — I10 Essential (primary) hypertension: Secondary | ICD-10-CM

## 2018-10-30 DIAGNOSIS — G629 Polyneuropathy, unspecified: Secondary | ICD-10-CM

## 2018-10-30 DIAGNOSIS — Z7901 Long term (current) use of anticoagulants: Secondary | ICD-10-CM

## 2018-10-30 DIAGNOSIS — I480 Paroxysmal atrial fibrillation: Secondary | ICD-10-CM | POA: Diagnosis not present

## 2018-10-30 DIAGNOSIS — Z79899 Other long term (current) drug therapy: Secondary | ICD-10-CM

## 2018-10-30 NOTE — Progress Notes (Signed)
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 2) History of pheochromocytoma LEFT adrenal gland status post resection on 03/16/2009 3) Prostate adenocarcinoma  PRIOR THERAPY: Systemic therapy with weekly Velcade 1.3 mg/KG subcutaneously, Revlimid 25 mg p.o. daily for 21 days every 4 weeks as well as Decadron 20 mg p.o. weekly.  First dose November 07, 2017.  Status post 7 cycles.   CURRENT THERAPY: Observation.  INTERVAL HISTORY: Thomas [name] 84 y.o. male returns to the clinic today for 3 months follow-up visit accompanied by his wife.  The patient is feeling fine today with no concerning complaints except for right knee pain from osteoarthritis.  He denied having any chest pain, shortness of breath, cough or hemoptysis.  He denied having any fever or chills.  He has no nausea, vomiting, diarrhea or constipation.  The patient denied having any headache or visual changes.  The patient had repeat myeloma panel performed recently and he is here for evaluation and discussion of his lab results.  MEDICAL HISTORY: Past Medical History:  Diagnosis Date  . BPH (benign prostatic hypertrophy)   . Chronic diastolic heart failure ([facility]) 12/27/2013  . Chronic kidney disease    Hx of bilateral renal cysts.  . Encounter for antineoplastic chemotherapy 02/20/2018  . Glaucoma   . Heart murmur   . History of atrial fibrillation 03/09/09  . Hx of epiglottitis    2011  . Hypercholesterolemia   . Hypertension   . Long term current use of amiodarone 11/07/2013   Amiodarone   . Mild aortic stenosis   . OSA on CPAP 06/26/2014  . Paroxysmal atrial fibrillation ([facility]) 11/07/2013  . Prostate cancer ([facility]) 7/12   T1C Ad Ca  s/p TURP, observation only  . Renal  insufficiency 09/06/2011  . Sinus node dysfunction ([facility]) 08/14/2014  . Sleep apnea   . 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  MEDICATIONS:  Current Outpatient Medications  Medication Sig Dispense Refill  . acetaminophen (TYLENOL) 500 MG tablet Take 1,000 mg by mouth at bedtime.    Marland Kitchen apixaban (ELIQUIS) 2.5 MG TABS tablet Take 1 tablet (2.5 mg total) by mouth 2 (two) times daily. 180 tablet 3  . brimonidine-timolol (COMBIGAN) 0.2-0.5 % ophthalmic solution Place 1 drop into both eyes 2 (two) times daily.     . Cholecalciferol (VITAMIN D3) 1000 UNITS CAPS Take 2 capsules by mouth daily.     Marland Kitchen diltiazem (CARDIZEM CD) 120 MG 24 hr capsule TAKE 1 CAPSULE BY MOUTH EVERY DAY 90 capsule 2  . latanoprost (XALATAN) 0.005 % ophthalmic solution INSTILL 1 DROP INTO BOTH EYES EVERY DAY AT NIGHT  6   No current facility-administered medications for this visit.     SURGICAL HISTORY:  Past Surgical History:  Procedure Laterality Date  . ADRENALECTOMY  02/12/11   Left, for Pheochromocytoma  . CARDIOVERSION N/A 12/26/2013   Procedure: CARDIOVERSION;  Surgeon: [name], MD;  Location: [facility];  Service: Cardiovascular;  Laterality: N/A;  . CHOLECYSTECTOMY  02/12/11  . GASTRECTOMY  1984   S/P gastrectomy for ulcer  . KNEE SURGERY  1971   S/P  Right knee surgery  . TEE WITHOUT CARDIOVERSION N/A 12/26/2013   Procedure: TRANSESOPHAGEAL ECHOCARDIOGRAM (TEE);  Surgeon: [name], MD;  Location: [facility];  Service: Cardiovascular;  Laterality: N/A;  . TRANSURETHRAL RESECTION OF PROSTATE  7/12    REVIEW OF SYSTEMS:  A comprehensive review of systems was negative except for: Musculoskeletal: positive for arthralgias   PHYSICAL EXAMINATION: General appearance: alert, cooperative and no distress Head:  Normocephalic, without obvious abnormality, atraumatic Neck: no adenopathy, no JVD, supple, symmetrical, trachea midline and thyroid not enlarged, symmetric, no tenderness/mass/nodules Lymph nodes: Cervical, supraclavicular, and axillary nodes normal. Resp: clear to auscultation bilaterally Back: symmetric, no curvature. ROM normal. No CVA tenderness. Cardio: regular rate and rhythm, S1, S2 normal, no murmur, click, rub or gallop GI: soft, non-tender; bowel sounds normal; no masses,  no organomegaly Extremities: extremities normal, atraumatic, no cyanosis or edema  ECOG PERFORMANCE STATUS: 1 - Symptomatic but completely ambulatory  Blood pressure 128/72, pulse 84, temperature 98.2 F (36.8 C), temperature source Oral, resp. rate 18, height 5' 5"  (1.651 m), weight 153 lb 9.6 oz (69.7 kg), SpO2 98 %.  LABORATORY DATA: Lab Results  Component Value Date   WBC 6.5 10/23/2018   HGB 16.5 10/23/2018   HCT 49.2 10/23/2018   MCV 92.8 10/23/2018   PLT 166 10/23/2018      Chemistry      Component Value Date/Time   NA 143 10/23/2018 1042   NA 138 08/16/2017 1404   K 4.4 10/23/2018 1042   K 4.5 08/16/2017 1404   CL 108 10/23/2018 1042   CL 109 (H) 03/21/2013 0958   CO2 26 10/23/2018 1042   CO2 24 08/16/2017 1404   BUN 23 10/23/2018 1042   BUN 17.3 08/16/2017 1404   CREATININE 1.64 (H) 10/23/2018 1042   CREATININE 1.8 (H) 08/16/2017 1404      Component Value Date/Time   CALCIUM 10.2 10/23/2018 1042   CALCIUM 9.9 08/16/2017 1404   ALKPHOS 106 10/23/2018 1042   ALKPHOS 86 08/16/2017 1404   AST 16 10/23/2018 1042   AST 22 08/16/2017 1404   ALT 10 10/23/2018 1042   ALT 16 08/16/2017 1404   BILITOT 0.8 10/23/2018 1042   BILITOT 0.78 08/16/2017 1404       RADIOGRAPHIC STUDIES: No results found.  ASSESSMENT AND PLAN:  This is a very pleasant 84 years old white male with IgG smoldering Loma diagnosed in April 2011. The patient has been on observation since 2011. His recent  myeloma panel showed further increase in the free kappa light chain. His recent skeletal bone survey showed no concerning findings for disease progression.  He has a focus of avascular necrosis but no significant lytic lesions. The recent bone marrow biopsy and aspirate showed further increase in the plasma cells to 15%. The patient underwent treatment with subcutaneous weekly Velcade as well as Revlimid and Decadron status post 7 cycles. He tolerated the previous treatment well except for mild peripheral neuropathy. He is currently on observation and feeling fine. Repeat myeloma panel showed no concerning findings for disease progression. I discussed the lab results with the patient and his wife and recommended for him to continue on observation with repeat myeloma panel in 3 months. The patient was advised to call immediately if he has any concerning symptoms in the interval. The patient voices understanding of current disease status and treatment options and is in agreement with the current care plan. All questions were answered. The patient knows to call the clinic with  any problems, questions or concerns. We can certainly see the patient much sooner if necessary. I spent 10 minutes counseling the patient face to face. The total time spent in the appointment was 15 minutes.  Disclaimer: This note was dictated with voice recognition software. Similar sounding words can inadvertently be transcribed and may not be corrected upon review.

## 2018-10-30 NOTE — Telephone Encounter (Signed)
Gave AVS and calendar °

## 2018-11-29 NOTE — Progress Notes (Signed)
Cardiology Office Note:    Date:  11/30/2018   ID:  Thomas [name], DOB 1934-10-07, MRN [id number]  PCP:  [name], MD  Cardiologist:  [name], MD   Referring MD: [name], MD   Chief Complaint  Patient presents with  . Atrial Fibrillation    History of Present Illness:    Thomas [name] is a 84 y.o. male with a hx of paroxysmal atrial fibrillation, chronic diastolic heart failure, hypertension, chronic kidney disease, unable to use anticoagulation therapy due to recurrent GU bleeding, and mild aortic stenosis.Other medical issues include metastatic prostate CA (L[place]) and indolent multiple myeloma.Recent recurrent atrial fibrillation.  He is in continuous atrial fibrillation.  He is doing well.  He has not had syncope.  He did fall 1 day ago but did not lose consciousness, have altered mentation, denied neurological sequela.  He slept well and has done okay since then.  He has not had blood in his urine or stool.  No lower extremity edema.  No palpitations or chest pain.  Past Medical History:  Diagnosis Date  . BPH (benign prostatic hypertrophy)   . Chronic diastolic heart failure ([facility]) 12/27/2013  . Chronic kidney disease    Hx of bilateral renal cysts.  . Encounter for antineoplastic chemotherapy 02/20/2018  . Glaucoma   . Heart murmur   . History of atrial fibrillation 03/09/09  . Hx of epiglottitis    2011  . Hypercholesterolemia   . Hypertension   . Long term current use of amiodarone 11/07/2013   Amiodarone   . Mild aortic stenosis   . OSA on CPAP 06/26/2014  . Paroxysmal atrial fibrillation ([facility]) 11/07/2013  . Prostate cancer ([facility]) 7/12   T1C Ad Ca  s/p TURP, observation only  . Renal insufficiency 09/06/2011  . Sinus node dysfunction ([facility]) 08/14/2014  . Sleep apnea   . Smoldering multiple myeloma ([facility])     Past Surgical History:  Procedure Laterality Date  . ADRENALECTOMY  02/12/11   Left, for Pheochromocytoma  . CARDIOVERSION N/A  12/26/2013   Procedure: CARDIOVERSION;  Surgeon: [name], MD;  Location: [facility];  Service: Cardiovascular;  Laterality: N/A;  . CHOLECYSTECTOMY  02/12/11  . GASTRECTOMY  1984   S/P gastrectomy for ulcer  . KNEE SURGERY  1971   S/P Right knee surgery  . TEE WITHOUT CARDIOVERSION N/A 12/26/2013   Procedure: TRANSESOPHAGEAL ECHOCARDIOGRAM (TEE);  Surgeon: [name], MD;  Location: [facility];  Service: Cardiovascular;  Laterality: N/A;  . TRANSURETHRAL RESECTION OF PROSTATE  7/12    Current Medications: Current Meds  Medication Sig  . acetaminophen (TYLENOL) 500 MG tablet Take 1,000 mg by mouth at bedtime.  Marland Kitchen apixaban (ELIQUIS) 2.5 MG TABS tablet Take 1 tablet (2.5 mg total) by mouth 2 (two) times daily.  . brimonidine-timolol (COMBIGAN) 0.2-0.5 % ophthalmic solution Place 1 drop into both eyes 2 (two) times daily.   . Cholecalciferol (VITAMIN D3) 50 MCG (2000 UT) capsule Take 1 capsule by mouth daily.   Marland Kitchen diltiazem (CARDIZEM CD) 120 MG 24 hr capsule TAKE 1 CAPSULE BY MOUTH EVERY DAY  . latanoprost (XALATAN) 0.005 % ophthalmic solution INSTILL 1 DROP INTO BOTH EYES EVERY DAY AT NIGHT     Allergies:   Benadryl [diphenhydramine hcl]; Ceftriaxone sodium; Contrast media [iodinated diagnostic agents]; Nsaids; Penicillins; Levaquin [levofloxacin]; and Chlorhexidine   Social History   Socioeconomic History  . Marital status: Married    Spouse name: Not on file  .  Number of children: 3  . Years of education: Not on file  . Highest education level: Not on file  Occupational History    Employer: RETIRED  Social Needs  . Financial resource strain: Not on file  . Food insecurity:    Worry: Not on file    Inability: Not on file  . Transportation needs:    Medical: Not on file    Non-medical: Not on file  Tobacco Use  . Smoking status: Never Smoker  . Smokeless tobacco: Never Used  Substance and Sexual Activity  . Alcohol use: No  . Drug use: No  . Sexual activity: Not  Currently  Lifestyle  . Physical activity:    Days per week: Not on file    Minutes per session: Not on file  . Stress: Not on file  Relationships  . Social connections:    Talks on phone: Not on file    Gets together: Not on file    Attends religious service: Not on file    Active member of club or organization: Not on file    Attends meetings of clubs or organizations: Not on file    Relationship status: Not on file  Other Topics Concern  . Not on file  Social History Narrative  . Not on file     Family History: The patient's family history includes Kidney Stones in his brother; Liver disease in his sister, sister, and sister; Melanoma in his brother; Polymyositis in his mother; Prostate cancer in his brother and father.  ROS:   Please see the history of present illness.    Appetite is improved.  Overall appearance is improved in terms of health.  Severe limiting back discomfort.  Also has right knee discomfort that prevents continuous physical activity for greater than several minutes.  Hearing loss bilateral.  All other systems reviewed and are negative.  EKGs/Labs/Other Studies Reviewed:    The following studies were reviewed today: No new data  EKG:  EKG not repeated  Recent Labs: 10/23/2018: ALT 10; BUN 23; Creatinine 1.64; Hemoglobin 16.5; Platelet Count 166; Potassium 4.4; Sodium 143  Recent Lipid Panel No results found for: CHOL, TRIG, HDL, CHOLHDL, VLDL, LDLCALC, LDLDIRECT  Physical Exam:    VS:  BP 126/72   Pulse 79   Ht 5' 5"  (1.651 m)   Wt 156 lb (70.8 kg)   SpO2 99%   BMI 25.96 kg/m     Wt Readings from Last 3 Encounters:  11/30/18 156 lb (70.8 kg)  10/30/18 153 lb 9.6 oz (69.7 kg)  07/31/18 148 lb 8 oz (67.4 kg)     GEN: Pink skin.  Appears healthier.. No acute distress HEENT: Normal NECK: No JVD. LYMPHATICS: No lymphadenopathy CARDIAC: Irregularly irregular RR.  1-2 over 6 right upper sternal border systolic murmur, no gallop, no  edema VASCULAR: 2+ bilateral radial and carotid pulses, no bruits RESPIRATORY:  Clear to auscultation without rales, wheezing or rhonchi  ABDOMEN: Soft, non-tender, non-distended, No pulsatile mass, MUSCULOSKELETAL: No deformity  SKIN: Warm and dry NEUROLOGIC:  Alert and oriented x 3 PSYCHIATRIC:  Normal affect   ASSESSMENT:    1. OSA on CPAP   2. Persistent atrial fibrillation   3. Chronic anticoagulation   4. Mild aortic stenosis   5. Long term current use of amiodarone   6. Multiple myeloma not having achieved remission ([facility])    PLAN:    In order of problems listed above:  1. Encourage compliance with CPAP. 2. Controlled  ventricular response. 3. No bleeding complications.  He is on reduced dose apixaban because of elevated creatinine above 1.5. 4. Systolic murmur is audible and unchanged. 5. Amiodarone therapy has been discontinued  Clinical follow-up in 9 months.  5 years of bleeding or head trauma.  Plan no change in current medical therapy.   Medication Adjustments/Labs and Tests Ordered: Current medicines are reviewed at length with the patient today.  Concerns regarding medicines are outlined above.  No orders of the defined types were placed in this encounter.  No orders of the defined types were placed in this encounter.   There are no Patient Instructions on file for this visit.   Signed, [name], MD  11/30/2018 8:35 AM    [place]

## 2018-11-30 ENCOUNTER — Ambulatory Visit: Payer: Medicare Other | Admitting: Interventional Cardiology

## 2018-11-30 ENCOUNTER — Encounter: Payer: Self-pay | Admitting: Interventional Cardiology

## 2018-11-30 VITALS — BP 126/72 | HR 79 | Ht 65.0 in | Wt 156.0 lb

## 2018-11-30 DIAGNOSIS — Z7901 Long term (current) use of anticoagulants: Secondary | ICD-10-CM | POA: Diagnosis not present

## 2018-11-30 DIAGNOSIS — I4819 Other persistent atrial fibrillation: Secondary | ICD-10-CM

## 2018-11-30 DIAGNOSIS — G4733 Obstructive sleep apnea (adult) (pediatric): Secondary | ICD-10-CM

## 2018-11-30 DIAGNOSIS — C9000 Multiple myeloma not having achieved remission: Secondary | ICD-10-CM

## 2018-11-30 DIAGNOSIS — Z79899 Other long term (current) drug therapy: Secondary | ICD-10-CM

## 2018-11-30 DIAGNOSIS — I350 Nonrheumatic aortic (valve) stenosis: Secondary | ICD-10-CM

## 2018-11-30 DIAGNOSIS — Z9989 Dependence on other enabling machines and devices: Secondary | ICD-10-CM

## 2018-11-30 NOTE — Patient Instructions (Signed)
Medication Instructions:  Your physician recommends that you continue on your current medications as directed. Please refer to the Current Medication list given to you today.  If you need a refill on your cardiac medications before your next appointment, please call your pharmacy.   Lab work: None If you have labs (blood work) drawn today and your tests are completely normal, you will receive your results only by: . MyChart Message (if you have MyChart) OR . A paper copy in the mail If you have any lab test that is abnormal or we need to change your treatment, we will call you to review the results.  Testing/Procedures: None  Follow-Up: At [facility], you and your health needs are our priority.  As part of our continuing mission to provide you with exceptional heart care, we have created designated Provider Care Teams.  These Care Teams include your primary Cardiologist (physician) and Advanced Practice Providers (APPs -  Physician Assistants and Nurse Practitioners) who all work together to provide you with the care you need, when you need it. You will need a follow up appointment in 9 months.  Please call our office 2 months in advance to schedule this appointment.  You may see Henry W Smith III, MD or one of the following Advanced Practice Providers on your designated Care Team:   Lori Gerhardt, NP Laura Ingold, NP . Jill McDaniel, NP  Any Other Special Instructions Will Be Listed Below (If Applicable).    

## 2019-01-04 ENCOUNTER — Telehealth: Payer: Self-pay | Admitting: *Deleted

## 2019-01-04 NOTE — Telephone Encounter (Signed)
[facility] Support Services   [facility] Support Services Team contacted patient to assess for food insecurity and other psychosocial needs during current COVID19 pandemic.    Patient/family expressed no needs at this time.  Support Team member encouraged patient to call if changes occur or they have any other questions/concerns.        Lauren Somers, LCSW  [facility]        

## 2019-01-29 ENCOUNTER — Other Ambulatory Visit: Payer: Self-pay

## 2019-01-29 ENCOUNTER — Inpatient Hospital Stay: Payer: Medicare Other | Attending: Internal Medicine

## 2019-01-29 DIAGNOSIS — Z79899 Other long term (current) drug therapy: Secondary | ICD-10-CM | POA: Insufficient documentation

## 2019-01-29 DIAGNOSIS — Z7901 Long term (current) use of anticoagulants: Secondary | ICD-10-CM | POA: Diagnosis not present

## 2019-01-29 DIAGNOSIS — C9000 Multiple myeloma not having achieved remission: Secondary | ICD-10-CM | POA: Diagnosis present

## 2019-01-29 DIAGNOSIS — Z8546 Personal history of malignant neoplasm of prostate: Secondary | ICD-10-CM | POA: Insufficient documentation

## 2019-01-29 DIAGNOSIS — I480 Paroxysmal atrial fibrillation: Secondary | ICD-10-CM | POA: Insufficient documentation

## 2019-01-29 DIAGNOSIS — G629 Polyneuropathy, unspecified: Secondary | ICD-10-CM | POA: Diagnosis not present

## 2019-01-29 LAB — CBC WITH DIFFERENTIAL (CANCER CENTER ONLY)
Abs Immature Granulocytes: 0.03 10*3/uL (ref 0.00–0.07)
Basophils Absolute: 0 10*3/uL (ref 0.0–0.1)
Basophils Relative: 0 %
Eosinophils Absolute: 0.2 10*3/uL (ref 0.0–0.5)
Eosinophils Relative: 3 %
HCT: 48.4 % (ref 39.0–52.0)
Hemoglobin: 16.3 g/dL (ref 13.0–17.0)
Immature Granulocytes: 1 %
Lymphocytes Relative: 29 %
Lymphs Abs: 1.8 10*3/uL (ref 0.7–4.0)
MCH: 31 pg (ref 26.0–34.0)
MCHC: 33.7 g/dL (ref 30.0–36.0)
MCV: 92.2 fL (ref 80.0–100.0)
Monocytes Absolute: 0.6 10*3/uL (ref 0.1–1.0)
Monocytes Relative: 10 %
Neutro Abs: 3.6 10*3/uL (ref 1.7–7.7)
Neutrophils Relative %: 57 %
Platelet Count: 160 10*3/uL (ref 150–400)
RBC: 5.25 MIL/uL (ref 4.22–5.81)
RDW: 13.3 % (ref 11.5–15.5)
WBC Count: 6.2 10*3/uL (ref 4.0–10.5)
nRBC: 0 % (ref 0.0–0.2)

## 2019-01-29 LAB — CMP (CANCER CENTER ONLY)
ALT: 10 U/L (ref 0–44)
AST: 15 U/L (ref 15–41)
Albumin: 3.8 g/dL (ref 3.5–5.0)
Alkaline Phosphatase: 101 U/L (ref 38–126)
Anion gap: 9 (ref 5–15)
BUN: 23 mg/dL (ref 8–23)
CO2: 27 mmol/L (ref 22–32)
Calcium: 9.6 mg/dL (ref 8.9–10.3)
Chloride: 106 mmol/L (ref 98–111)
Creatinine: 1.81 mg/dL — ABNORMAL HIGH (ref 0.61–1.24)
GFR, Est AFR Am: 39 mL/min — ABNORMAL LOW (ref >60)
GFR, Estimated: 34 mL/min — ABNORMAL LOW (ref >60)
Glucose, Bld: 78 mg/dL (ref 70–99)
Potassium: 4.5 mmol/L (ref 3.5–5.1)
Sodium: 142 mmol/L (ref 135–145)
Total Bilirubin: 0.8 mg/dL (ref 0.3–1.2)
Total Protein: 6.3 g/dL — ABNORMAL LOW (ref 6.5–8.1)

## 2019-01-29 LAB — LACTATE DEHYDROGENASE: LDH: 135 U/L (ref 98–192)

## 2019-01-30 LAB — KAPPA/LAMBDA LIGHT CHAINS
Kappa free light chain: 19.9 mg/L — ABNORMAL HIGH (ref 3.3–19.4)
Kappa, lambda light chain ratio: 2.16 — ABNORMAL HIGH (ref 0.26–1.65)
Lambda free light chains: 9.2 mg/L (ref 5.7–26.3)

## 2019-01-30 LAB — BETA 2 MICROGLOBULIN, SERUM: Beta-2 Microglobulin: 3 mg/L — ABNORMAL HIGH (ref 0.6–2.4)

## 2019-01-30 LAB — IGG, IGA, IGM
IgA: 47 mg/dL — ABNORMAL LOW (ref 61–437)
IgG (Immunoglobin G), Serum: 673 mg/dL (ref 603–1613)
IgM (Immunoglobulin M), Srm: 19 mg/dL (ref 15–143)

## 2019-02-05 ENCOUNTER — Telehealth: Payer: Self-pay | Admitting: Internal Medicine

## 2019-02-05 ENCOUNTER — Other Ambulatory Visit: Payer: Self-pay

## 2019-02-05 ENCOUNTER — Encounter: Payer: Self-pay | Admitting: Internal Medicine

## 2019-02-05 ENCOUNTER — Inpatient Hospital Stay: Payer: Medicare Other | Attending: Internal Medicine | Admitting: Internal Medicine

## 2019-02-05 VITALS — BP 122/75 | HR 81 | Temp 96.9°F | Resp 18 | Ht 65.0 in | Wt 153.6 lb

## 2019-02-05 DIAGNOSIS — Z79899 Other long term (current) drug therapy: Secondary | ICD-10-CM

## 2019-02-05 DIAGNOSIS — Z7901 Long term (current) use of anticoagulants: Secondary | ICD-10-CM | POA: Insufficient documentation

## 2019-02-05 DIAGNOSIS — Z8546 Personal history of malignant neoplasm of prostate: Secondary | ICD-10-CM | POA: Diagnosis not present

## 2019-02-05 DIAGNOSIS — I10 Essential (primary) hypertension: Secondary | ICD-10-CM

## 2019-02-05 DIAGNOSIS — G629 Polyneuropathy, unspecified: Secondary | ICD-10-CM | POA: Insufficient documentation

## 2019-02-05 DIAGNOSIS — I480 Paroxysmal atrial fibrillation: Secondary | ICD-10-CM | POA: Diagnosis not present

## 2019-02-05 DIAGNOSIS — C9000 Multiple myeloma not having achieved remission: Secondary | ICD-10-CM | POA: Diagnosis not present

## 2019-02-05 NOTE — Progress Notes (Signed)
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 2) History of pheochromocytoma LEFT adrenal gland status post resection on 03/16/2009 3) Prostate adenocarcinoma  PRIOR THERAPY: Systemic therapy with weekly Velcade 1.3 mg/KG subcutaneously, Revlimid 25 mg p.o. daily for 21 days every 4 weeks as well as Decadron 20 mg p.o. weekly.  First dose November 07, 2017.  Status post 7 cycles.  CURRENT THERAPY: Observation.  INTERVAL HISTORY: Thomas [name] 84 y.o. male returns to the clinic today for 3 months follow-up visit.  The patient is feeling fine today with no concerning complaints.  The patient denied having any current chest pain, shortness of breath, cough or hemoptysis.  He denied having any fever or chills.  He has no nausea, vomiting, diarrhea, he has no headache or visual changes.  He denied having any significant weight loss or night sweats.  The patient had repeat myeloma panel performed recently and is here for evaluation lab results.  MEDICAL HISTORY: Past Medical History:  Diagnosis Date  . BPH (benign prostatic hypertrophy)   . Chronic diastolic heart failure ([place]) 12/27/2013  . Chronic kidney disease    Hx of bilateral renal cysts.  . Encounter for antineoplastic chemotherapy 02/20/2018  . Glaucoma   . Heart murmur   . History of atrial fibrillation 03/09/09  . Hx of epiglottitis    2011  . Hypercholesterolemia   . Hypertension   . Long term current use of amiodarone 11/07/2013   Amiodarone   . Mild aortic stenosis   . OSA on CPAP 06/26/2014  . Paroxysmal atrial fibrillation ([place]) 11/07/2013  . Prostate cancer ([place]) 7/12   T1C Ad Ca  s/p TURP, observation only  . Renal insufficiency 09/06/2011  . Sinus node dysfunction  ([place]) 08/14/2014  . Sleep apnea   . 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  MEDICATIONS:  Current Outpatient Medications  Medication Sig Dispense Refill  . acetaminophen (TYLENOL) 500 MG tablet Take 1,000 mg by mouth at bedtime.    Marland Kitchen apixaban (ELIQUIS) 2.5 MG TABS tablet Take 1 tablet (2.5 mg total) by mouth 2 (two) times daily. 180 tablet 3  . brimonidine-timolol (COMBIGAN) 0.2-0.5 % ophthalmic solution Place 1 drop into both eyes 2 (two) times daily.     . Cholecalciferol (VITAMIN D3) 50 MCG (2000 UT) capsule Take 1 capsule by mouth daily.     Marland Kitchen diltiazem (CARDIZEM CD) 120 MG 24 hr capsule TAKE 1 CAPSULE BY MOUTH EVERY DAY 90 capsule 2  . latanoprost (XALATAN) 0.005 % ophthalmic solution INSTILL 1 DROP INTO BOTH EYES EVERY DAY AT NIGHT  6   No current facility-administered medications for this visit.     SURGICAL HISTORY:  Past Surgical History:  Procedure Laterality Date  . ADRENALECTOMY  02/12/11   Left, for Pheochromocytoma  . CARDIOVERSION N/A 12/26/2013   Procedure: CARDIOVERSION;  Surgeon: [name], MD;  Location: [facility];  Service: Cardiovascular;  Laterality: N/A;  . CHOLECYSTECTOMY  02/12/11  . GASTRECTOMY  1984   S/P gastrectomy for ulcer  . KNEE SURGERY  1971   S/P Right knee surgery  . TEE WITHOUT  CARDIOVERSION N/A 12/26/2013   Procedure: TRANSESOPHAGEAL ECHOCARDIOGRAM (TEE);  Surgeon: [name], MD;  Location: [facility];  Service: Cardiovascular;  Laterality: N/A;  . TRANSURETHRAL RESECTION OF PROSTATE  7/12    REVIEW OF SYSTEMS:  A comprehensive review of systems was negative.   PHYSICAL EXAMINATION: General appearance: alert, cooperative and no distress Head: Normocephalic, without obvious abnormality, atraumatic Neck: no adenopathy, no JVD, supple, symmetrical,  trachea midline and thyroid not enlarged, symmetric, no tenderness/mass/nodules Lymph nodes: Cervical, supraclavicular, and axillary nodes normal. Resp: clear to auscultation bilaterally Back: symmetric, no curvature. ROM normal. No CVA tenderness. Cardio: regular rate and rhythm, S1, S2 normal, no murmur, click, rub or gallop GI: soft, non-tender; bowel sounds normal; no masses,  no organomegaly Extremities: extremities normal, atraumatic, no cyanosis or edema  ECOG PERFORMANCE STATUS: 1 - Symptomatic but completely ambulatory  Blood pressure 122/75, pulse 81, temperature (!) 96.9 F (36.1 C), temperature source Oral, resp. rate 18, height 5' 5" (1.651 m), weight 153 lb 9.6 oz (69.7 kg), SpO2 100 %.  LABORATORY DATA: Lab Results  Component Value Date   WBC 6.2 01/29/2019   HGB 16.3 01/29/2019   HCT 48.4 01/29/2019   MCV 92.2 01/29/2019   PLT 160 01/29/2019      Chemistry      Component Value Date/Time   NA 142 01/29/2019 1137   NA 138 08/16/2017 1404   K 4.5 01/29/2019 1137   K 4.5 08/16/2017 1404   CL 106 01/29/2019 1137   CL 109 (H) 03/21/2013 0958   CO2 27 01/29/2019 1137   CO2 24 08/16/2017 1404   BUN 23 01/29/2019 1137   BUN 17.3 08/16/2017 1404   CREATININE 1.81 (H) 01/29/2019 1137   CREATININE 1.8 (H) 08/16/2017 1404      Component Value Date/Time   CALCIUM 9.6 01/29/2019 1137   CALCIUM 9.9 08/16/2017 1404   ALKPHOS 101 01/29/2019 1137   ALKPHOS 86 08/16/2017 1404   AST 15 01/29/2019 1137   AST 22 08/16/2017 1404   ALT 10 01/29/2019 1137   ALT 16 08/16/2017 1404   BILITOT 0.8 01/29/2019 1137   BILITOT 0.78 08/16/2017 1404       RADIOGRAPHIC STUDIES: No results found.  ASSESSMENT AND PLAN:  This is a very pleasant 84 years old white male with IgG smoldering Loma diagnosed in April 2011. The patient has been on observation since 2011. His recent myeloma panel showed further increase in the free kappa light chain. His recent skeletal bone survey showed  no concerning findings for disease progression.  He has a focus of avascular necrosis but no significant lytic lesions. The recent bone marrow biopsy and aspirate showed further increase in the plasma cells to 15%. The patient underwent treatment with subcutaneous weekly Velcade as well as Revlimid and Decadron status post 7 cycles. He tolerated the previous treatment well except for mild peripheral neuropathy. The patient has been on observation for several months now and is feeling fine. Repeat myeloma panel performed recently showed no concerning findings for disease progression. I discussed the lab results with the patient and recommended for him to continue on observation with repeat myeloma panel in 3 months. The patient was advised to call immediately if he has any concerning symptoms in the interval. The patient voices understanding of current disease status and treatment options and is in agreement with the current care plan. All questions were answered. The patient knows to call the clinic with any problems, questions or concerns. We can  certainly see the patient much sooner if necessary. I spent 10 minutes counseling the patient face to face. The total time spent in the appointment was 15 minutes.  Disclaimer: This note was dictated with voice recognition software. Similar sounding words can inadvertently be transcribed and may not be corrected upon review.        

## 2019-02-05 NOTE — Telephone Encounter (Signed)
Scheduled appt per 5/5 los - appt in 3 months. Sent reminder letter in the mail

## 2019-04-29 ENCOUNTER — Other Ambulatory Visit (HOSPITAL_COMMUNITY): Payer: Self-pay | Admitting: Urology

## 2019-04-29 ENCOUNTER — Other Ambulatory Visit: Payer: Self-pay | Admitting: Urology

## 2019-04-29 DIAGNOSIS — C61 Malignant neoplasm of prostate: Secondary | ICD-10-CM

## 2019-05-07 ENCOUNTER — Other Ambulatory Visit: Payer: Self-pay

## 2019-05-07 ENCOUNTER — Inpatient Hospital Stay: Payer: Medicare Other | Attending: Internal Medicine

## 2019-05-07 DIAGNOSIS — G629 Polyneuropathy, unspecified: Secondary | ICD-10-CM | POA: Insufficient documentation

## 2019-05-07 DIAGNOSIS — Z881 Allergy status to other antibiotic agents status: Secondary | ICD-10-CM | POA: Diagnosis not present

## 2019-05-07 DIAGNOSIS — I130 Hypertensive heart and chronic kidney disease with heart failure and stage 1 through stage 4 chronic kidney disease, or unspecified chronic kidney disease: Secondary | ICD-10-CM | POA: Diagnosis not present

## 2019-05-07 DIAGNOSIS — Z8546 Personal history of malignant neoplasm of prostate: Secondary | ICD-10-CM | POA: Insufficient documentation

## 2019-05-07 DIAGNOSIS — Z888 Allergy status to other drugs, medicaments and biological substances status: Secondary | ICD-10-CM | POA: Insufficient documentation

## 2019-05-07 DIAGNOSIS — G8929 Other chronic pain: Secondary | ICD-10-CM | POA: Diagnosis not present

## 2019-05-07 DIAGNOSIS — C9000 Multiple myeloma not having achieved remission: Secondary | ICD-10-CM | POA: Diagnosis not present

## 2019-05-07 DIAGNOSIS — Z886 Allergy status to analgesic agent status: Secondary | ICD-10-CM | POA: Diagnosis not present

## 2019-05-07 DIAGNOSIS — N189 Chronic kidney disease, unspecified: Secondary | ICD-10-CM | POA: Insufficient documentation

## 2019-05-07 DIAGNOSIS — R0602 Shortness of breath: Secondary | ICD-10-CM | POA: Insufficient documentation

## 2019-05-07 DIAGNOSIS — R5383 Other fatigue: Secondary | ICD-10-CM | POA: Diagnosis not present

## 2019-05-07 DIAGNOSIS — Z79899 Other long term (current) drug therapy: Secondary | ICD-10-CM | POA: Insufficient documentation

## 2019-05-07 DIAGNOSIS — Z880 Allergy status to penicillin: Secondary | ICD-10-CM | POA: Diagnosis not present

## 2019-05-07 DIAGNOSIS — M549 Dorsalgia, unspecified: Secondary | ICD-10-CM | POA: Diagnosis not present

## 2019-05-07 DIAGNOSIS — M1990 Unspecified osteoarthritis, unspecified site: Secondary | ICD-10-CM | POA: Diagnosis not present

## 2019-05-07 DIAGNOSIS — Z7901 Long term (current) use of anticoagulants: Secondary | ICD-10-CM | POA: Insufficient documentation

## 2019-05-07 DIAGNOSIS — M545 Low back pain: Secondary | ICD-10-CM | POA: Insufficient documentation

## 2019-05-07 LAB — CMP (CANCER CENTER ONLY)
ALT: 12 U/L (ref 0–44)
AST: 15 U/L (ref 15–41)
Albumin: 3.9 g/dL (ref 3.5–5.0)
Alkaline Phosphatase: 102 U/L (ref 38–126)
Anion gap: 8 (ref 5–15)
BUN: 22 mg/dL (ref 8–23)
CO2: 24 mmol/L (ref 22–32)
Calcium: 9.9 mg/dL (ref 8.9–10.3)
Chloride: 109 mmol/L (ref 98–111)
Creatinine: 1.63 mg/dL — ABNORMAL HIGH (ref 0.61–1.24)
GFR, Est AFR Am: 45 mL/min — ABNORMAL LOW (ref >60)
GFR, Estimated: 39 mL/min — ABNORMAL LOW (ref >60)
Glucose, Bld: 134 mg/dL — ABNORMAL HIGH (ref 70–99)
Potassium: 4.4 mmol/L (ref 3.5–5.1)
Sodium: 141 mmol/L (ref 135–145)
Total Bilirubin: 0.8 mg/dL (ref 0.3–1.2)
Total Protein: 6.1 g/dL — ABNORMAL LOW (ref 6.5–8.1)

## 2019-05-07 LAB — CBC WITH DIFFERENTIAL (CANCER CENTER ONLY)
Abs Immature Granulocytes: 0.03 10*3/uL (ref 0.00–0.07)
Basophils Absolute: 0 10*3/uL (ref 0.0–0.1)
Basophils Relative: 0 %
Eosinophils Absolute: 0.1 10*3/uL (ref 0.0–0.5)
Eosinophils Relative: 2 %
HCT: 49.1 % (ref 39.0–52.0)
Hemoglobin: 16.4 g/dL (ref 13.0–17.0)
Immature Granulocytes: 1 %
Lymphocytes Relative: 22 %
Lymphs Abs: 1.5 10*3/uL (ref 0.7–4.0)
MCH: 31.4 pg (ref 26.0–34.0)
MCHC: 33.4 g/dL (ref 30.0–36.0)
MCV: 94.1 fL (ref 80.0–100.0)
Monocytes Absolute: 0.5 10*3/uL (ref 0.1–1.0)
Monocytes Relative: 8 %
Neutro Abs: 4.5 10*3/uL (ref 1.7–7.7)
Neutrophils Relative %: 67 %
Platelet Count: 158 10*3/uL (ref 150–400)
RBC: 5.22 MIL/uL (ref 4.22–5.81)
RDW: 13.4 % (ref 11.5–15.5)
WBC Count: 6.6 10*3/uL (ref 4.0–10.5)
nRBC: 0 % (ref 0.0–0.2)

## 2019-05-07 LAB — LACTATE DEHYDROGENASE: LDH: 138 U/L (ref 98–192)

## 2019-05-08 LAB — IGG, IGA, IGM
IgA: 45 mg/dL — ABNORMAL LOW (ref 61–437)
IgG (Immunoglobin G), Serum: 676 mg/dL (ref 603–1613)
IgM (Immunoglobulin M), Srm: 20 mg/dL (ref 15–143)

## 2019-05-08 LAB — KAPPA/LAMBDA LIGHT CHAINS
Kappa free light chain: 22.2 mg/L — ABNORMAL HIGH (ref 3.3–19.4)
Kappa, lambda light chain ratio: 2.55 — ABNORMAL HIGH (ref 0.26–1.65)
Lambda free light chains: 8.7 mg/L (ref 5.7–26.3)

## 2019-05-08 LAB — BETA 2 MICROGLOBULIN, SERUM: Beta-2 Microglobulin: 2.4 mg/L (ref 0.6–2.4)

## 2019-05-14 ENCOUNTER — Telehealth: Payer: Self-pay | Admitting: Internal Medicine

## 2019-05-14 ENCOUNTER — Other Ambulatory Visit: Payer: Self-pay

## 2019-05-14 ENCOUNTER — Encounter: Payer: Self-pay | Admitting: Physician Assistant

## 2019-05-14 ENCOUNTER — Inpatient Hospital Stay: Payer: Medicare Other | Admitting: Physician Assistant

## 2019-05-14 VITALS — BP 135/79 | HR 88 | Temp 98.2°F | Resp 17 | Ht 65.0 in | Wt 153.1 lb

## 2019-05-14 DIAGNOSIS — C9000 Multiple myeloma not having achieved remission: Secondary | ICD-10-CM | POA: Diagnosis not present

## 2019-05-14 NOTE — Progress Notes (Signed)
[facility] OFFICE PROGRESS NOTE  [name], MD [place] [address]  DIAGNOSIS:  1) IgG Kappa multiple myeloma diagnosed in February 2011 with 11% plasma cells on the bone marrow biopsy. 2) History of pheochromocytoma LEFT adrenal gland status post resection on 03/16/2009 3) Prostate adenocarcinoma  PRIOR THERAPY:  Systemic therapy with weekly Velcade 1.3 mg/KG subcutaneously, Revlimid 25 mg p.o. daily for 21 days every 4 weeks as well as Decadron 20 mg p.o. weekly.  First dose November 07, 2017.  Status post 7 cycles.  CURRENT THERAPY: Observation.  INTERVAL HISTORY: Thomas [name] 84 y.o. male returns to the clinic for a follow-up visit.  The patient is feeling well today without any concerning complaints. The patient denies any fever, chills, night sweats, lymphadenopathy or weight loss. He reports his baseline level of fatigue.  The patient denies any new pain except for his chronic low back pain which he attributes to arthritis. The patient denies any cough, chest pain, or hemoptysis.  The patient reports occasional shortness of breath which occurs predominantly at rest.  The patient states this is secondary to his persistent atrial fibrillation for which he is followed closely by cardiology.  The patient denies any bleeding or bruising including epistaxis, gingival bleeding, melena, hematuria, or hematochezia. He denies any recent infections.  Denies any nausea, vomiting, diarrhea, constipation, or appetite change.  The patient recently had a myeloma panel performed.  He is here today for evaluation and to review his lab results.  MEDICAL HISTORY: Past Medical History:  Diagnosis Date  . BPH (benign prostatic hypertrophy)   . Chronic diastolic heart failure ([place]) 12/27/2013  . Chronic kidney disease    Hx of bilateral renal cysts.  . Encounter for antineoplastic chemotherapy 02/20/2018  . Glaucoma   . Heart murmur   . History of  atrial fibrillation 03/09/09  . Hx of epiglottitis    2011  . Hypercholesterolemia   . Hypertension   . Long term current use of amiodarone 11/07/2013   Amiodarone   . Mild aortic stenosis   . OSA on CPAP 06/26/2014  . Paroxysmal atrial fibrillation ([place]) 11/07/2013  . Prostate cancer ([place]) 7/12   T1C Ad Ca  s/p TURP, observation only  . Renal insufficiency 09/06/2011  . Sinus node dysfunction ([place]) 08/14/2014  . Sleep apnea   . 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  MEDICATIONS:  Current Outpatient Medications  Medication Sig Dispense Refill  . acetaminophen (TYLENOL) 500 MG tablet Take 1,000 mg by mouth at bedtime.    Marland Kitchen apixaban (ELIQUIS) 2.5 MG TABS tablet Take 1 tablet (2.5 mg total) by mouth 2 (two) times daily. 180 tablet 3  . brimonidine-timolol (COMBIGAN) 0.2-0.5 % ophthalmic solution Place 1 drop into both eyes 2 (two) times daily.     . Cholecalciferol (VITAMIN D3) 50 MCG (2000 UT) capsule Take 1 capsule by mouth daily.     Marland Kitchen diltiazem (CARDIZEM CD) 120 MG 24 hr capsule TAKE 1 CAPSULE BY MOUTH EVERY DAY 90 capsule 2  . dorzolamide-timolol (COSOPT) 22.3-6.8 MG/ML ophthalmic solution INSTILL 1 DROP INTO BOTH EYES TWICE A DAY    . latanoprost (XALATAN) 0.005 % ophthalmic solution INSTILL 1 DROP INTO BOTH EYES EVERY DAY AT NIGHT  6   No current facility-administered medications for this visit.     SURGICAL  HISTORY:  Past Surgical History:  Procedure Laterality Date  . ADRENALECTOMY  02/12/11   Left, for Pheochromocytoma  . CARDIOVERSION N/A 12/26/2013   Procedure: CARDIOVERSION;  Surgeon: [name], MD;  Location: [facility];  Service: Cardiovascular;  Laterality: N/A;  . CHOLECYSTECTOMY  02/12/11  . GASTRECTOMY  1984   S/P gastrectomy for ulcer  . KNEE SURGERY  1971   S/P Right knee  surgery  . TEE WITHOUT CARDIOVERSION N/A 12/26/2013   Procedure: TRANSESOPHAGEAL ECHOCARDIOGRAM (TEE);  Surgeon: [name], MD;  Location: [facility];  Service: Cardiovascular;  Laterality: N/A;  . TRANSURETHRAL RESECTION OF PROSTATE  7/12    REVIEW OF SYSTEMS:   Review of Systems  Constitutional: Positive for fatigue. Negative for appetite change, chills, fever and unexpected weight change.  HENT: Negative for mouth sores, nosebleeds, sore throat and trouble swallowing.   Eyes: Negative for eye problems and icterus.  Respiratory: Positive for intermittent shortness of breath at rest. Negative for cough, hemoptysis, and wheezing.   Cardiovascular: Negative for chest pain and leg swelling.  Gastrointestinal: Negative for abdominal pain, constipation, diarrhea, nausea and vomiting.  Genitourinary: Negative for bladder incontinence, difficulty urinating, dysuria, frequency and hematuria.   Musculoskeletal: Positive for chronic back pain secondary to arthritis. Negative for gait problem, neck pain and neck stiffness.  Skin: Negative for itching and rash.  Neurological: Negative for dizziness, extremity weakness, gait problem, headaches, light-headedness and seizures.  Hematological: Negative for adenopathy. Does not bruise/bleed easily.  Psychiatric/Behavioral: Negative for confusion, depression and sleep disturbance. The patient is not nervous/anxious.     PHYSICAL EXAMINATION:  Blood pressure 135/79, pulse 88, temperature 98.2 F (36.8 C), temperature source Temporal, resp. rate 17, height 5' 5" (1.651 m), weight 153 lb 1 oz (69.4 kg), SpO2 99 %.  ECOG PERFORMANCE STATUS: 1 - Symptomatic but completely ambulatory  Physical Exam  Constitutional: Oriented to person, place, and time and well-developed, well-nourished, and in no distress.  HENT:  Head: Normocephalic and atraumatic.  Mouth/Throat: Oropharynx is clear and moist. No oropharyngeal exudate.  Eyes: Conjunctivae are normal.  Right eye exhibits no discharge. Left eye exhibits no discharge. No scleral icterus.  Neck: Normal range of motion. Neck supple.  Cardiovascular: Normal rate, irregularly irregular (hx persistent afib), normal heart sounds and intact distal pulses.   Pulmonary/Chest: Effort normal and breath sounds normal. No respiratory distress. No wheezes. No rales.  Abdominal: Soft. Bowel sounds are normal. Exhibits no distension and no mass. There is no tenderness.  Musculoskeletal: Normal range of motion. Exhibits no edema.  Lymphadenopathy:    No cervical adenopathy.  Neurological: Alert and oriented to person, place, and time. Exhibits normal muscle tone. Gait normal. Coordination normal.  Skin: Skin is warm and dry. No rash noted. Not diaphoretic. No erythema. No pallor.  Psychiatric: Mood, memory and judgment normal.  Vitals reviewed.  LABORATORY DATA: Lab Results  Component Value Date   WBC 6.6 05/07/2019   HGB 16.4 05/07/2019   HCT 49.1 05/07/2019   MCV 94.1 05/07/2019   PLT 158 05/07/2019      Chemistry      Component Value Date/Time   NA 141 05/07/2019 0943   NA 138 08/16/2017 1404   K 4.4 05/07/2019 0943   K 4.5 08/16/2017 1404   CL 109 05/07/2019 0943   CL 109 (H) 03/21/2013 0958   CO2 24 05/07/2019 0943   CO2 24 08/16/2017 1404   BUN 22 05/07/2019 0943   BUN 17.3 08/16/2017 1404  CREATININE 1.63 (H) 05/07/2019 0943   CREATININE 1.8 (H) 08/16/2017 1404      Component Value Date/Time   CALCIUM 9.9 05/07/2019 0943   CALCIUM 9.9 08/16/2017 1404   ALKPHOS 102 05/07/2019 0943   ALKPHOS 86 08/16/2017 1404   AST 15 05/07/2019 0943   AST 22 08/16/2017 1404   ALT 12 05/07/2019 0943   ALT 16 08/16/2017 1404   BILITOT 0.8 05/07/2019 0943   BILITOT 0.78 08/16/2017 1404       RADIOGRAPHIC STUDIES:  No results found.   ASSESSMENT/PLAN:  This is a very pleasant 84 year old Caucasian male with IgG multiple myeloma.  He was diagnosed in 2011.  The patient had been on  observation from 2011-2019. He had a bone marrow biopsy performed in 2019 which showed an increase in plasma cells to 15%.  He then underwent treatment with subcutaneous Velcade,  25 mg p.o. of Revlimid for 21 days every 4 weeks and 20 mg of Decadron.  He is status post 7 cycles.  He tolerated his treatment well except for mild peripheral neuropathy.  He received his last dose of treatment on 04/11/2019.   He has been on observation since this time and is feeling well today.  The patient was seen with Dr. Julien Nordmann today.  The patient had a repeat myeloma panel performed recently. Dr. Julien Nordmann discussed the results with the patient today. His labs did not show any concerning evidence for disease progression.  Dr. Julien Nordmann recommends that the patient continue on observation with a repeat myeloma panel in 3 months.  The patient was advised to call immediately if he has any concerning symptoms in the interval. The patient voices understanding of current disease status and treatment options and is in agreement with the current care plan. All questions were answered. The patient knows to call the clinic with any problems, questions or concerns. We can certainly see the patient much sooner if necessary   Orders Placed This Encounter  Procedures  . CMP ([facility] only)    Standing Status:   Future    Standing Expiration Date:   05/13/2020  . CBC with Differential (Cancer Center Only)    Standing Status:   Future    Standing Expiration Date:   05/13/2020  . Lactate dehydrogenase (LDH)    Standing Status:   Future    Standing Expiration Date:   05/13/2020  . QIG  (Quant. immunoglobulins  - IgG, IgA, IgM)    Standing Status:   Future    Standing Expiration Date:   05/13/2020  . Beta 2 microglobulin    Standing Status:   Future    Standing Expiration Date:   05/13/2020  . Kappa/lambda light chains    Standing Status:   Future    Standing Expiration Date:   05/13/2020     Thomas [name] L Aline Wesche,  PA-C 05/14/19  ADDENDUM: Hematology/Oncology Attending: I had a face-to-face encounter with the patient today.  I recommended his care plan.  This is a very pleasant 84 years old white male with history of multiple myeloma status post treatment with 7 cycles of systemic therapy with subcutaneous Velcade, Revlimid and Decadron.  He tolerated his treatment well and has almost complete response to the treatment. The patient is currently on observation and doing fine. He had repeat myeloma panel performed recently.  I reviewed the lab results with the patient today.  It showed no concerning findings for significant progression. I recommended for the patient to continue on observation with  repeat myeloma panel in 3 months. He was advised to call immediately if he has any concerning symptoms in the interval.  Disclaimer: This note was dictated with voice recognition software. Similar sounding words can inadvertently be transcribed and may be missed upon review. Eilleen Kempf, MD 05/14/19

## 2019-05-14 NOTE — Telephone Encounter (Signed)
Scheduled per 08/11 los, patient received avs and calender.

## 2019-05-15 ENCOUNTER — Other Ambulatory Visit: Payer: Self-pay | Admitting: Interventional Cardiology

## 2019-05-15 MED ORDER — DILTIAZEM HCL ER COATED BEADS 120 MG PO CP24: ORAL_CAPSULE | ORAL | 1 refills | Status: DC

## 2019-05-30 ENCOUNTER — Other Ambulatory Visit: Payer: Self-pay | Admitting: Interventional Cardiology

## 2019-05-30 MED ORDER — APIXABAN 2.5 MG PO TABS: 2.5000 mg | ORAL_TABLET | Freq: Two times a day (BID) | ORAL | 2 refills | Status: DC

## 2019-05-30 NOTE — Telephone Encounter (Signed)
Pt last saw Dr Tamala Julian 11/30/18, last labs 05/07/19 Creat 1.63, age 84, weight 69.4kg, based on specified criteria pt is on appropriate dosage of Eliquis 2.5mg  BID.  Will refill rx.

## 2019-05-30 NOTE — Telephone Encounter (Signed)
 *  STAT* If patient is at the pharmacy, call can be transferred to refill team.   1. Which medications need to be refilled? (please list name of each medication and dose if known) apixaban (ELIQUIS) 2.5 MG TABS tablet  2. Which pharmacy/location (including street and city if local pharmacy) is medication to be sent to? [facility]  3. Do they need a 30 day or 90 day supply? [place]

## 2019-08-13 ENCOUNTER — Inpatient Hospital Stay: Payer: Medicare Other | Attending: Internal Medicine

## 2019-08-13 ENCOUNTER — Other Ambulatory Visit: Payer: Self-pay

## 2019-08-13 DIAGNOSIS — Z9989 Dependence on other enabling machines and devices: Secondary | ICD-10-CM | POA: Diagnosis not present

## 2019-08-13 DIAGNOSIS — I10 Essential (primary) hypertension: Secondary | ICD-10-CM | POA: Insufficient documentation

## 2019-08-13 DIAGNOSIS — Z881 Allergy status to other antibiotic agents status: Secondary | ICD-10-CM | POA: Diagnosis not present

## 2019-08-13 DIAGNOSIS — E7800 Pure hypercholesterolemia, unspecified: Secondary | ICD-10-CM | POA: Insufficient documentation

## 2019-08-13 DIAGNOSIS — G4733 Obstructive sleep apnea (adult) (pediatric): Secondary | ICD-10-CM | POA: Diagnosis not present

## 2019-08-13 DIAGNOSIS — Z888 Allergy status to other drugs, medicaments and biological substances status: Secondary | ICD-10-CM | POA: Insufficient documentation

## 2019-08-13 DIAGNOSIS — I5032 Chronic diastolic (congestive) heart failure: Secondary | ICD-10-CM | POA: Diagnosis not present

## 2019-08-13 DIAGNOSIS — Z9079 Acquired absence of other genital organ(s): Secondary | ICD-10-CM | POA: Insufficient documentation

## 2019-08-13 DIAGNOSIS — Z886 Allergy status to analgesic agent status: Secondary | ICD-10-CM | POA: Diagnosis not present

## 2019-08-13 DIAGNOSIS — M5136 Other intervertebral disc degeneration, lumbar region: Secondary | ICD-10-CM | POA: Insufficient documentation

## 2019-08-13 DIAGNOSIS — Z880 Allergy status to penicillin: Secondary | ICD-10-CM | POA: Diagnosis not present

## 2019-08-13 DIAGNOSIS — Z79899 Other long term (current) drug therapy: Secondary | ICD-10-CM | POA: Insufficient documentation

## 2019-08-13 DIAGNOSIS — C9000 Multiple myeloma not having achieved remission: Secondary | ICD-10-CM

## 2019-08-13 DIAGNOSIS — C61 Malignant neoplasm of prostate: Secondary | ICD-10-CM | POA: Insufficient documentation

## 2019-08-13 LAB — CMP (CANCER CENTER ONLY)
ALT: 14 U/L (ref 0–44)
AST: 17 U/L (ref 15–41)
Albumin: 4.1 g/dL (ref 3.5–5.0)
Alkaline Phosphatase: 109 U/L (ref 38–126)
Anion gap: 7 (ref 5–15)
BUN: 24 mg/dL — ABNORMAL HIGH (ref 8–23)
CO2: 26 mmol/L (ref 22–32)
Calcium: 9.8 mg/dL (ref 8.9–10.3)
Chloride: 109 mmol/L (ref 98–111)
Creatinine: 1.65 mg/dL — ABNORMAL HIGH (ref 0.61–1.24)
GFR, Est AFR Am: 44 mL/min — ABNORMAL LOW (ref >60)
GFR, Estimated: 38 mL/min — ABNORMAL LOW (ref >60)
Glucose, Bld: 98 mg/dL (ref 70–99)
Potassium: 4.5 mmol/L (ref 3.5–5.1)
Sodium: 142 mmol/L (ref 135–145)
Total Bilirubin: 1 mg/dL (ref 0.3–1.2)
Total Protein: 6.4 g/dL — ABNORMAL LOW (ref 6.5–8.1)

## 2019-08-13 LAB — CBC WITH DIFFERENTIAL (CANCER CENTER ONLY)
Abs Immature Granulocytes: 0.03 10*3/uL (ref 0.00–0.07)
Basophils Absolute: 0 10*3/uL (ref 0.0–0.1)
Basophils Relative: 0 %
Eosinophils Absolute: 0.1 10*3/uL (ref 0.0–0.5)
Eosinophils Relative: 1 %
HCT: 48.8 % (ref 39.0–52.0)
Hemoglobin: 16.5 g/dL (ref 13.0–17.0)
Immature Granulocytes: 0 %
Lymphocytes Relative: 26 %
Lymphs Abs: 1.8 10*3/uL (ref 0.7–4.0)
MCH: 31.6 pg (ref 26.0–34.0)
MCHC: 33.8 g/dL (ref 30.0–36.0)
MCV: 93.5 fL (ref 80.0–100.0)
Monocytes Absolute: 0.6 10*3/uL (ref 0.1–1.0)
Monocytes Relative: 9 %
Neutro Abs: 4.4 10*3/uL (ref 1.7–7.7)
Neutrophils Relative %: 64 %
Platelet Count: 173 10*3/uL (ref 150–400)
RBC: 5.22 MIL/uL (ref 4.22–5.81)
RDW: 13.6 % (ref 11.5–15.5)
WBC Count: 6.9 10*3/uL (ref 4.0–10.5)
nRBC: 0 % (ref 0.0–0.2)

## 2019-08-13 LAB — LACTATE DEHYDROGENASE: LDH: 189 U/L (ref 98–192)

## 2019-08-14 LAB — IGG, IGA, IGM
IgA: 53 mg/dL — ABNORMAL LOW (ref 61–437)
IgG (Immunoglobin G), Serum: 718 mg/dL (ref 603–1613)
IgM (Immunoglobulin M), Srm: 26 mg/dL (ref 15–143)

## 2019-08-14 LAB — KAPPA/LAMBDA LIGHT CHAINS
Kappa free light chain: 25.9 mg/L — ABNORMAL HIGH (ref 3.3–19.4)
Kappa, lambda light chain ratio: 1.63 (ref 0.26–1.65)
Lambda free light chains: 15.9 mg/L (ref 5.7–26.3)

## 2019-08-14 LAB — BETA 2 MICROGLOBULIN, SERUM: Beta-2 Microglobulin: 3 mg/L — ABNORMAL HIGH (ref 0.6–2.4)

## 2019-08-15 ENCOUNTER — Encounter: Payer: Self-pay | Admitting: Cardiovascular Disease

## 2019-08-15 ENCOUNTER — Ambulatory Visit: Payer: Medicare Other | Admitting: Cardiovascular Disease

## 2019-08-15 ENCOUNTER — Other Ambulatory Visit: Payer: Self-pay

## 2019-08-15 VITALS — BP 126/62 | HR 78 | Temp 97.0°F | Ht 65.0 in | Wt 154.0 lb

## 2019-08-15 DIAGNOSIS — Z9989 Dependence on other enabling machines and devices: Secondary | ICD-10-CM

## 2019-08-15 DIAGNOSIS — I350 Nonrheumatic aortic (valve) stenosis: Secondary | ICD-10-CM

## 2019-08-15 DIAGNOSIS — I10 Essential (primary) hypertension: Secondary | ICD-10-CM

## 2019-08-15 DIAGNOSIS — Z7901 Long term (current) use of anticoagulants: Secondary | ICD-10-CM

## 2019-08-15 DIAGNOSIS — G4733 Obstructive sleep apnea (adult) (pediatric): Secondary | ICD-10-CM | POA: Diagnosis not present

## 2019-08-15 DIAGNOSIS — Z008 Encounter for other general examination: Secondary | ICD-10-CM

## 2019-08-15 NOTE — Patient Instructions (Signed)
Medication Instructions:  Your physician recommends that you continue on your current medications as directed. Please refer to the Current Medication list given to you today.  *If you need a refill on your cardiac medications before your next appointment, please call your pharmacy*  Lab Work: none If you have labs (blood work) drawn today and your tests are completely normal, you will receive your results only by: Marland Kitchen MyChart Message (if you have MyChart) OR . A paper copy in the mail If you have any lab test that is abnormal or we need to change your treatment, we will call you to review the results.  Testing/Procedures: none  Follow-Up: At [facility], you and your health needs are our priority.  As part of our continuing mission to provide you with exceptional heart care, we have created designated Provider Care Teams.  These Care Teams include your primary Cardiologist (physician) and Advanced Practice Providers (APPs -  Physician Assistants and Nurse Practitioners) who all work together to provide you with the care you need, when you need it.  Your next appointment:   12 months  The format for your next appointment:   In Person  Provider:   Shelva Majestic, MD  Other Instructions You will be contacted by Theressa Stamps regarding your CPAP machine

## 2019-08-15 NOTE — Progress Notes (Signed)
Patient ID: Thomas [name], male   DOB: 11-Apr-1935, 84 y.o.   MRN: [id number]     HPI: Thomas [name], is a 84 y.o. male who is followed by Dr. Tamala Julian for cardiology care.  I initially saw him  in September 2015 for sleep clinic evaluation following initiation of CPAP therapy.  I last saw him in October 2019 he presents for 1 year follow-up sleep evaluation.  Thomas [name] has a history of paroxysmal atrial fibrillation and  has been maintaining sinus rhythm with sinus bradycardia.  He has a history of hypertension, hyperlipidemia, documented bilateral renal cysts, and mild aortic valve stenosis, and multiple myeloma.  Due to concerns for obstructive sleep apnea, particularly with his atrial fibrillation, history he was referred by Dr. Tamala Julian for a diagnostic polysomnogram in 2015.   His diagnostic polysomnogram was done at the [facility] on 02/05/2014 revealed severe obstructive sleep apnea with an AHI of 45.7 per hour.  He had oxygen desaturation to 86% and there was evidence for moderate snoring.  There were frequent periodic limb movements with an index of 36 per hour with 20.3 per hour to arousal.  He subsequently was referred for a CPAP titration in a CPAP pressure of 9 cm was recommended.  Of note, there was significant improvement in his periodic leg movements with CPAP therapy.  A download from 05/24/2014 to 06/22/2014 showed excellent compliance with 100% days of usage.  He had 97% days with use greater than 4 hours and was meeting Medicare compliance standards.  He has an AirSense 10 AutoSet unit and is at a set pressure of 9 cm.  His AHI is now excellent at 1.2.  There is no leak.  He has a Special educational needs teacher, fullface mask (medium).  When I saw him in February 2018 he was 100% compliant with CPAP.  Download  from 10/23/2016 through 11/21/2016, which confirmed 100% compliance.  CPAP was set at 9 cm set pressure; AHI was 1.0.  He was using a full facemask.  His nocturia had  significantly improved with CPAP treatment. His Epworth Sleepiness Scale score is improved and was 6 compared to previously being elevated  Epworth Sleepiness Scale: Situation   Chance of Dozing/Sleeping (0 = never , 1 = slight chance , 2 = moderate chance , 3 = high chance )   sitting and reading 2   watching TV 1   sitting inactive in a public place 0   being a passenger in a motor vehicle for an hour or more 0   lying down in the afternoon 3   sitting and talking to someone 0   sitting quietly after lunch (no alcohol) 0   while stopped for a few minutes in traffic as the driver 0   Total Score  6   He continues to be followed by Dr. Tamala Julian for his paroxysmal atrial fibrillation, chronic diastolic heart failure, hypertension, chronic kidney disease and mild aortic stenosis.  Remotely he was not on anticoagulation secondary to  recurrent GU bleeding, but currently is on low-dose Eliquis.  He has undergone chemotherapy for multiple myeloma as well as treatment for prostate CA.  When I last saw him in October 2019 he had  lost 49 pounds with his recent illnesses.  His primary physician question whether or not he still needed to use CPAP therapy.  In the office today I calculated a new Epworth Sleepiness Scale score and this endorsed at 11 and was consistent with mild  daytime sleepiness.  He has a ResMed air sense 10 AutoSet unit and has been set at a 9 cm pressure.  He was using an old air fit F10 full facemask and has had difficulty with pressure on the bridge of his nose.  A download from June 25, 2018 through July 24, 2018 shows 100% compliance;  averaging 6 hours and 26 minutes of CPAP use.  AHI is 3.1.  There was no leak.  During that evaluation I recommended he switch to the DreamWear technology full facemask in light of his difficulty with the older facemask on the bridge of his nose.  We discussed the importance of increasing sleep duration since his CPAP use was just under 6 hours and  30 minutes.  Since I last saw him, he continues to be in permanent atrial fibrillation and is on diltiazem CD 120 mg and Eliquis 2.5 mg twice a day.  He last saw Dr. Tamala Julian in February 2020.  A download was obtained today from July 13, 2019 through August 11, 2019.  Usage days 87%.  He has not been able to sleep for long duration since his wife had fallen and had broken 4 vertebrae and has had significant difficulty resulting from this.  As result he has been caring for her and oftentimes gets up during the night.  On his most recent download average use was 5 hours per night.  At his 9 cm water pressure, AHI however continues to be excellent at 1.2.  He typically goes to bed at 1130 and wakes up at 630.  He likes the current air fit F 30 medium size mask that he is using that has a Pharmacologist compared to his old full facemask.  However, at times the tubing which from the front sometimes gets in the way when he tries to turn from side to side.  He presents for yearly evaluation.   Past Medical History:  Diagnosis Date  . BPH (benign prostatic hypertrophy)   . Chronic diastolic heart failure ([facility]) 12/27/2013  . Chronic kidney disease    Hx of bilateral renal cysts.  . Encounter for antineoplastic chemotherapy 02/20/2018  . Glaucoma   . Heart murmur   . History of atrial fibrillation 03/09/09  . Hx of epiglottitis    2011  . Hypercholesterolemia   . Hypertension   . Long term current use of amiodarone 11/07/2013   Amiodarone   . Mild aortic stenosis   . OSA on CPAP 06/26/2014  . Paroxysmal atrial fibrillation ([facility]) 11/07/2013  . Prostate cancer ([facility]) 7/12   T1C Ad Ca  s/p TURP, observation only  . Renal insufficiency 09/06/2011  . Sinus node dysfunction ([facility]) 08/14/2014  . Sleep apnea   . Smoldering multiple myeloma ([facility])     Past Surgical History:  Procedure Laterality Date  . ADRENALECTOMY  02/12/11   Left, for Pheochromocytoma  . CARDIOVERSION N/A 12/26/2013   Procedure:  CARDIOVERSION;  Surgeon: [name], MD;  Location: [facility];  Service: Cardiovascular;  Laterality: N/A;  . CHOLECYSTECTOMY  02/12/11  . GASTRECTOMY  1984   S/P gastrectomy for ulcer  . KNEE SURGERY  1971   S/P Right knee surgery  . TEE WITHOUT CARDIOVERSION N/A 12/26/2013   Procedure: TRANSESOPHAGEAL ECHOCARDIOGRAM (TEE);  Surgeon: [name], MD;  Location: [facility];  Service: Cardiovascular;  Laterality: N/A;  . TRANSURETHRAL RESECTION OF PROSTATE  7/12    Allergies  Allergen Reactions  . Benadryl [Diphenhydramine Hcl] Other (  See Comments)    Bad dreams -  Hallucinations.  . Ceftriaxone Sodium Hives  . Contrast Media [Iodinated Diagnostic Agents] Other (See Comments)    Poor kidney function  . Nsaids Other (See Comments)    REACTION: BLEEDING ULCER  . Penicillins Hives  . Levaquin [Levofloxacin]   . Chlorhexidine Rash    Use alcohol for skin prep per patient request.     Current Outpatient Medications  Medication Sig Dispense Refill  . acetaminophen (TYLENOL) 500 MG tablet Take 1,000 mg by mouth as needed.     Marland Kitchen apixaban (ELIQUIS) 2.5 MG TABS tablet Take 1 tablet (2.5 mg total) by mouth 2 (two) times daily. 180 tablet 2  . Cholecalciferol (VITAMIN D3) 50 MCG (2000 UT) capsule Take 1 capsule by mouth daily.     Marland Kitchen diltiazem (CARDIZEM CD) 120 MG 24 hr capsule TAKE 1 CAPSULE BY MOUTH EVERY DAY 90 capsule 1  . dorzolamide-timolol (COSOPT) 22.3-6.8 MG/ML ophthalmic solution INSTILL 1 DROP INTO BOTH EYES TWICE A DAY    . latanoprost (XALATAN) 0.005 % ophthalmic solution INSTILL 1 DROP INTO BOTH EYES EVERY DAY AT NIGHT  6   No current facility-administered medications for this visit.     Social History   Socioeconomic History  . Marital status: Married    Spouse name: Not on file  . Number of children: 3  . Years of education: Not on file  . Highest education level: Not on file  Occupational History    Employer: RETIRED  Social Needs  . Financial resource  strain: Not on file  . Food insecurity    Worry: Not on file    Inability: Not on file  . Transportation needs    Medical: Not on file    Non-medical: Not on file  Tobacco Use  . Smoking status: Never Smoker  . Smokeless tobacco: Never Used  Substance and Sexual Activity  . Alcohol use: No  . Drug use: No  . Sexual activity: Not Currently  Lifestyle  . Physical activity    Days per week: Not on file    Minutes per session: Not on file  . Stress: Not on file  Relationships  . Social Herbalist on phone: Not on file    Gets together: Not on file    Attends religious service: Not on file    Active member of club or organization: Not on file    Attends meetings of clubs or organizations: Not on file    Relationship status: Not on file  . Intimate partner violence    Fear of current or ex partner: Not on file    Emotionally abused: Not on file    Physically abused: Not on file    Forced sexual activity: Not on file  Other Topics Concern  . Not on file  Social History Narrative  . Not on file     ROS General: Negative; No fevers, chills, or night sweats HEENT: Negative; No changes in vision or hearing, sinus congestion, difficulty swallowing Pulmonary: Negative; No cough, wheezing, shortness of breath, hemoptysis Cardiovascular: Positive for permanent atrial fibrillation no chest pain, presyncope, syncope, palpatations GI: Negative; No nausea, vomiting, diarrhea, or abdominal pain GU: Negative; No dysuria, hematuria, or difficulty voiding Musculoskeletal: Negative; no myalgias, joint pain, or weakness Hematologic: Positive for multiple myeloma; and prostate CA Endocrine: Negative; no heat/cold intolerance Neuro: Negative; no changes in balance, headaches Skin: Negative; No rashes or skin lesions Psychiatric: Negative; No behavioral problems,  depression Sleep: Positive for OSA on CPAP; No daytime sleepiness, hypersomnolence, bruxism, restless legs, hypnogognic  hallucinations, no cataplexy   Physical Exam BP 126/62 (BP Location: Left Arm, Patient Position: Sitting, Cuff Size: Normal)   Pulse 78   Temp (!) 97 F (36.1 C)   Ht _0  (1.651 m)   Wt 154 lb (69.9 kg)   BMI 25.63 kg/m    Repeat blood pressure by me 114/64  Wt Readings from Last 3 Encounters:  08/15/19 154 lb (69.9 kg)  05/14/19 153 lb 1 oz (69.4 kg)  02/05/19 153 lb 9.6 oz (69.7 kg)      Physical Exam BP 126/62 (BP Location: Left Arm, Patient Position: Sitting, Cuff Size: Normal)   Pulse 78   Temp (!) 97 F (36.1 C)   Ht _1  (1.651 m)   Wt 154 lb (69.9 kg)   BMI 25.63 kg/m  General: Alert, oriented, no distress.  Skin: normal turgor, no rashes, warm and dry HEENT: Normocephalic, atraumatic. Pupils equal round and reactive to light; sclera anicteric; extraocular muscles intact;  Nose without nasal septal hypertrophy Mouth/Parynx benign; Mallinpatti scale Neck: No JVD, no carotid bruits; normal carotid upstroke Lungs: clear to ausculatation and percussion; no wheezing or rales Chest wall: without tenderness to palpitation Heart: PMI not displaced, irregularly irregular with a ventricular rate in the 70s, s1 s2 normal, 2/6 systolic murmur, no diastolic murmur, no rubs, gallops, thrills, or heaves Abdomen: soft, nontender; no hepatosplenomehaly, BS+; abdominal aorta nontender and not dilated by palpation. Back: no CVA tenderness Pulses 2+ Musculoskeletal: full range of motion, normal strength, no joint deformities Extremities: no clubbing cyanosis or edema, Homan's sign negative  Neurologic: grossly nonfocal; Cranial nerves grossly wnl Psychologic: Normal mood and affect   ECG (independently read by me): Atrial fibrillation at 78; QTc 414 msec  October 2019 ECG (independently read by me): Atrial fibrillation at 84 bpm.  QTc interval 460 ms.  February 2018 ECG (independently read by me): Sinus bradycardia at 59 bpm with first-degree AV block (PR interval 21  6/22).  QTc interval 451 ms.  LABS:  BMP Latest Ref Rng & Units 08/13/2019 05/07/2019 01/29/2019  Glucose 70 - 99 mg/dL 98 134(H) 78  BUN 8 - 23 mg/dL 24(H) 22 23  Creatinine 0.61 - 1.24 mg/dL 1.65(H) 1.63(H) 1.81(H)  Sodium 135 - 145 mmol/L 142 141 142  Potassium 3.5 - 5.1 mmol/L 4.5 4.4 4.5  Chloride 98 - 111 mmol/L 109 109 106  CO2 22 - 32 mmol/L _2 Calcium 8.9 - 10.3 mg/dL 9.8 9.9 9.6   Hepatic Function Latest Ref Rng & Units 08/13/2019 05/07/2019 01/29/2019  Total Protein 6.5 - 8.1 g/dL 6.4(L) 6.1(L) 6.3(L)  Albumin 3.5 - 5.0 g/dL 4.1 3.9 3.8  AST 15 - 41 U/L _3 ALT 0 - 44 U/L _4 Alk Phosphatase 38 - 126 U/L 109 102 101  Total Bilirubin 0.3 - 1.2 mg/dL 1.0 0.8 0.8  Bilirubin, Direct <=0.2 mg/dL - - -   CBC Latest Ref Rng & Units 08/13/2019 05/07/2019 01/29/2019  WBC 4.0 - 10.5 K/uL 6.9 6.6 6.2  Hemoglobin 13.0 - 17.0 g/dL 16.5 16.4 16.3  Hematocrit 39.0 - 52.0 % 48.8 49.1 48.4  Platelets 150 - 400 K/uL 173 158 160   Lab Results  Component Value Date   TSH 4.500 03/29/2017   Lab Results  Component Value Date   HGBA1C 5.4 12/25/2013   Lipid Panel  No results found  for: CHOL, TRIG, HDL, CHOLHDL, VLDL, LDLCALC, LDLDIRECT   RADIOLOGY: No results found.  IMPRESSION:  1. OSA on CPAP   2. Permanent atrial fibrillation   3. Chronic anticoagulation   4. Essential hypertension   5. Mild aortic stenosis     ASSESSMENT AND PLAN: Thomas [name] Is an 84 year old male who has a history of permanent atrial fibrillation, previous documentation of chronic diastolic heart failure, hypertension, chronic kidney disease, and mild aortic stenosis.  He has been documented to have severe obstructive sleep apnea with an AHI of 45.7 and has been on CPAP therapy since 03/26/2014.   He had significant periodic leg movements on the baseline study, but these significantly improved with CPAP initiation.  He has had issues with his left knee and often moves his legs for comfort  but denies any painful restless legs.  Over the years he had lost significant weight from 196 pounds to last year at 148.  Weight today is 184 pounds.  His wife had recently fallen and sustained several vertebrae for a fractures as well as fracture of her sternum.  As result he has not been able to sleep for adequate sleep duration as we discussed last year.  On his most recent download, AHI remains excellent at 1.2 at his 9 cm water pressure.  However average CPAP use was 5 hours.  It is for this reason that his Epworth Sleepiness Scale score calculated at 11 most likely the result of suboptimal sleep duration.  I again discussed with him optimal sleep duration in an adult is 8 hours per night.  Clinically he is doing well with CPAP.  I have recommended changing his ResMed mask to an air fit F 30i which has the benefit where the tubing arises from the crown of the head and therefore can be placed above the pillow and not interfere with his side to side motion.  Atrial fibrillation rate is controlled.  Blood pressure today is stable.  He will follow-up with Dr. Tamala Julian for his cardiology care.  I will see him in 1 year for repeat sleep evaluation.  Time spent: 25 minutes  Thomas Sine, MD, [facility]  08/17/2019 3:13 PM

## 2019-08-17 ENCOUNTER — Encounter: Payer: Self-pay | Admitting: Cardiovascular Disease

## 2019-08-19 ENCOUNTER — Encounter (HOSPITAL_COMMUNITY)
Admission: RE | Admit: 2019-08-19 | Discharge: 2019-08-19 | Disposition: A | Discharge status: Home / Self Care | Payer: Medicare Other | Source: Ambulatory Visit | Attending: Urology | Admitting: Urology

## 2019-08-19 ENCOUNTER — Other Ambulatory Visit: Payer: Self-pay

## 2019-08-19 ENCOUNTER — Ambulatory Visit (HOSPITAL_COMMUNITY)
Admission: RE | Admit: 2019-08-19 | Discharge: 2019-08-19 | Disposition: A | Discharge status: Home / Self Care | Payer: Medicare Other | Source: Ambulatory Visit | Attending: Urology | Admitting: Urology

## 2019-08-19 DIAGNOSIS — C61 Malignant neoplasm of prostate: Secondary | ICD-10-CM | POA: Diagnosis not present

## 2019-08-19 MED ORDER — TECHNETIUM TC 99M MEDRONATE IV KIT
21.6000 | PACK | Freq: Once | INTRAVENOUS | Status: AC | PRN
  Administered 2019-08-19: 21.6 via INTRAVENOUS

## 2019-08-20 ENCOUNTER — Encounter: Payer: Self-pay | Admitting: Internal Medicine

## 2019-08-20 ENCOUNTER — Telehealth: Payer: Self-pay | Admitting: Internal Medicine

## 2019-08-20 ENCOUNTER — Inpatient Hospital Stay: Payer: Medicare Other | Admitting: Internal Medicine

## 2019-08-20 ENCOUNTER — Other Ambulatory Visit: Payer: Self-pay

## 2019-08-20 VITALS — BP 152/85 | HR 75 | Temp 97.9°F | Resp 16 | Ht 65.0 in | Wt 153.1 lb

## 2019-08-20 DIAGNOSIS — I10 Essential (primary) hypertension: Secondary | ICD-10-CM

## 2019-08-20 DIAGNOSIS — C9000 Multiple myeloma not having achieved remission: Secondary | ICD-10-CM

## 2019-08-20 DIAGNOSIS — C61 Malignant neoplasm of prostate: Secondary | ICD-10-CM | POA: Diagnosis not present

## 2019-08-20 NOTE — Telephone Encounter (Signed)
Scheduled appt per 11/17 los.  Printed calendar and avs,

## 2019-08-20 NOTE — Progress Notes (Signed)
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 2) History of pheochromocytoma LEFT adrenal gland status post resection on 03/16/2009 3) Prostate adenocarcinoma  PRIOR THERAPY: Systemic therapy with weekly Velcade 1.3 mg/KG subcutaneously, Revlimid 25 mg p.o. daily for 21 days every 4 weeks as well as Decadron 20 mg p.o. weekly.  First dose November 07, 2017.  Status post 7 cycles.  CURRENT THERAPY: Observation.  INTERVAL HISTORY: Thomas [name] 84 y.o. male returns to the clinic today for 3 months follow-up visit.  The patient is feeling fine today with no concerning complaints except for aching pain.  He had a bone scan by his urologist yesterday that was unremarkable for any metastatic disease.  The patient denied having any current chest pain, shortness breath, cough or hemoptysis.  He denied having any fever or chills.  He has no nausea, vomiting, diarrhea or constipation.  He denied having any recent weight loss or night sweats.  He had repeat myeloma panel performed recently and he is here for evaluation and discussion of his lab results and treatment options.  MEDICAL HISTORY: Past Medical History:  Diagnosis Date  . BPH (benign prostatic hypertrophy)   . Chronic diastolic heart failure ([facility]) 12/27/2013  . Chronic kidney disease    Hx of bilateral renal cysts.  . Encounter for antineoplastic chemotherapy 02/20/2018  . Glaucoma   . Heart murmur   . History of atrial fibrillation 03/09/09  . Hx of epiglottitis    2011  . Hypercholesterolemia   . Hypertension   . Long term current use of amiodarone 11/07/2013   Amiodarone   . Mild aortic stenosis   . OSA on CPAP 06/26/2014  . Paroxysmal atrial fibrillation ([facility]) 11/07/2013  .  Prostate cancer ([facility]) 7/12   T1C Ad Ca  s/p TURP, observation only  . Renal insufficiency 09/06/2011  . Sinus node dysfunction ([facility]) 08/14/2014  . Sleep apnea   . 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  MEDICATIONS:  Current Outpatient Medications  Medication Sig Dispense Refill  . acetaminophen (TYLENOL) 500 MG tablet Take 1,000 mg by mouth as needed.     Marland Kitchen apixaban (ELIQUIS) 2.5 MG TABS tablet Take 1 tablet (2.5 mg total) by mouth 2 (two) times daily. 180 tablet 2  . Cholecalciferol (VITAMIN D3) 50 MCG (2000 UT) capsule Take 1 capsule by mouth daily.     Marland Kitchen diltiazem (CARDIZEM CD) 120 MG 24 hr capsule TAKE 1 CAPSULE BY MOUTH EVERY DAY 90 capsule 1  . dorzolamide-timolol (COSOPT) 22.3-6.8 MG/ML ophthalmic solution INSTILL 1 DROP INTO BOTH EYES TWICE A DAY    . latanoprost (XALATAN) 0.005 % ophthalmic solution INSTILL 1 DROP INTO BOTH EYES EVERY DAY AT NIGHT  6   No current facility-administered medications for this visit.     SURGICAL HISTORY:  Past Surgical History:  Procedure Laterality Date  . ADRENALECTOMY  02/12/11   Left, for Pheochromocytoma  . CARDIOVERSION N/A 12/26/2013   Procedure: CARDIOVERSION;  Surgeon: [name], MD;  Location: [facility];  Service: Cardiovascular;  Laterality: N/A;  . CHOLECYSTECTOMY  02/12/11  . GASTRECTOMY  1984  S/P gastrectomy for ulcer  . KNEE SURGERY  1971   S/P Right knee surgery  . TEE WITHOUT CARDIOVERSION N/A 12/26/2013   Procedure: TRANSESOPHAGEAL ECHOCARDIOGRAM (TEE);  Surgeon: [name], MD;  Location: [facility];  Service: Cardiovascular;  Laterality: N/A;  . TRANSURETHRAL RESECTION OF PROSTATE  7/12    REVIEW OF SYSTEMS:  A comprehensive review of systems was negative except for: Musculoskeletal: positive for arthralgias   PHYSICAL  EXAMINATION: General appearance: alert, cooperative and no distress Head: Normocephalic, without obvious abnormality, atraumatic Neck: no adenopathy, no JVD, supple, symmetrical, trachea midline and thyroid not enlarged, symmetric, no tenderness/mass/nodules Lymph nodes: Cervical, supraclavicular, and axillary nodes normal. Resp: clear to auscultation bilaterally Back: symmetric, no curvature. ROM normal. No CVA tenderness. Cardio: regular rate and rhythm, S1, S2 normal, no murmur, click, rub or gallop GI: soft, non-tender; bowel sounds normal; no masses,  no organomegaly Extremities: extremities normal, atraumatic, no cyanosis or edema  ECOG PERFORMANCE STATUS: 1 - Symptomatic but completely ambulatory  Blood pressure (!) 152/85, pulse 75, temperature 97.9 F (36.6 C), temperature source Temporal, resp. rate 16, height 5' 5" (1.651 m), weight 153 lb 1.6 oz (69.4 kg), SpO2 100 %.  LABORATORY DATA: Lab Results  Component Value Date   WBC 6.9 08/13/2019   HGB 16.5 08/13/2019   HCT 48.8 08/13/2019   MCV 93.5 08/13/2019   PLT 173 08/13/2019      Chemistry      Component Value Date/Time   NA 142 08/13/2019 1028   NA 138 08/16/2017 1404   K 4.5 08/13/2019 1028   K 4.5 08/16/2017 1404   CL 109 08/13/2019 1028   CL 109 (H) 03/21/2013 0958   CO2 26 08/13/2019 1028   CO2 24 08/16/2017 1404   BUN 24 (H) 08/13/2019 1028   BUN 17.3 08/16/2017 1404   CREATININE 1.65 (H) 08/13/2019 1028   CREATININE 1.8 (H) 08/16/2017 1404      Component Value Date/Time   CALCIUM 9.8 08/13/2019 1028   CALCIUM 9.9 08/16/2017 1404   ALKPHOS 109 08/13/2019 1028   ALKPHOS 86 08/16/2017 1404   AST 17 08/13/2019 1028   AST 22 08/16/2017 1404   ALT 14 08/13/2019 1028   ALT 16 08/16/2017 1404   BILITOT 1.0 08/13/2019 1028   BILITOT 0.78 08/16/2017 1404       RADIOGRAPHIC STUDIES: Nm Bone Scan Whole Body  Result Date: 08/19/2019 CLINICAL DATA:  Prostate cancer, rising PSA = 16.3 on 02/15/2019  EXAM: NUCLEAR MEDICINE WHOLE BODY BONE SCAN TECHNIQUE: Whole body anterior and posterior images were obtained approximately 3 hours after intravenous injection of radiopharmaceutical. RADIOPHARMACEUTICALS:  21.6 mCi Technetium-59mMDP IV COMPARISON:  CT abdomen and pelvis 08/19/2019 FINDINGS: Increased tracer uptake at the shoulders, sternoclavicular joints, elbows, wrists, knees, and ankles/feet, typically degenerative. Likely physiologic uptake at the manubriosternal junction. Minimal uptake along the concave LEFT lateral border of the lumbar spine typically degenerative, unchanged. Questionable increased tracer uptake at the anterior RIGHT second and third ribs. No additional focal sites of abnormal osseous tracer uptake are seen to suggest osseous metastatic disease. Expected urinary tract and soft tissue distribution of tracer. IMPRESSION: Scattered degenerative type uptake with questionable sites of increased tracer uptake at the anterior RIGHT second and third ribs, nonspecific, cannot completely exclude osseous metastases. No other scintigraphic abnormalities identified. Electronically Signed   By: MLavonia DanaM.D.   On: 08/19/2019 18:11    ASSESSMENT AND PLAN:  This is a very pleasant 84years old white  male with IgG smoldering [place] diagnosed in April 2011. The patient has been on observation since 2011. His recent myeloma panel showed further increase in the free kappa light chain. His recent skeletal bone survey showed no concerning findings for disease progression.  He has a focus of avascular necrosis but no significant lytic lesions. The recent bone marrow biopsy and aspirate showed further increase in the plasma cells to 15%. The patient underwent treatment with subcutaneous weekly Velcade as well as Revlimid and Decadron status post 7 cycles. He tolerated the previous treatment well except for mild peripheral neuropathy. He has been in observation for more than a year now and feeling well.  The patient had repeat myeloma panel performed recently.  I discussed the results with the patient today. His myeloma panel showed no concerning findings for progression. I recommended for him to continue on observation with repeat myeloma panel in 3 months. For the prostate cancer, he is followed by Dr. Alinda Money.  His recent bone scan showed no evidence of metastatic disease. The patient was advised to call immediately if he has any concerning symptoms in the interval. The patient voices understanding of current disease status and treatment options and is in agreement with the current care plan. All questions were answered. The patient knows to call the clinic with any problems, questions or concerns. We can certainly see the patient much sooner if necessary. I spent 10 minutes counseling the patient face to face. The total time spent in the appointment was 15 minutes.  Disclaimer: This note was dictated with voice recognition software. Similar sounding words can inadvertently be transcribed and may not be corrected upon review.

## 2019-09-01 NOTE — Progress Notes (Signed)
Cardiology Office Note:    Date:  09/02/2019   ID:  [name], DOB July 22, 1935, MRN [id number]  PCP:  [name], MD  Cardiologist:  [name], MD   Referring MD: [name], MD   Chief Complaint  Patient presents with  . Coronary Artery Disease  . Atrial Fibrillation  . Congestive Heart Failure    History of Present Illness:    Thomas [name] is a 84 y.o. male with a hx of paroxysmal atrial fibrillation, chronic diastolic heart failure, hypertension, chronic kidney disease, unable to use anticoagulation therapy due to recurrent GU bleeding, and mild aortic stenosis.Other medical issues include metastatic prostate CA (L[place]ron) and indolent multiple myeloma  He has been doing well.  He denies cardiovascular complaints.  He specifically denies palpitations, syncope, lower extremity swelling, orthopnea, PND, and transient neurological symptoms.  He denies bleeding on apixaban.  Past Medical History:  Diagnosis Date  . BPH (benign prostatic hypertrophy)   . Chronic diastolic heart failure ([place]) 12/27/2013  . Chronic kidney disease    Hx of bilateral renal cysts.  . Encounter for antineoplastic chemotherapy 02/20/2018  . Glaucoma   . Heart murmur   . History of atrial fibrillation 03/09/09  . Hx of epiglottitis    2011  . Hypercholesterolemia   . Hypertension   . Long term current use of amiodarone 11/07/2013   Amiodarone   . Mild aortic stenosis   . OSA on CPAP 06/26/2014  . Paroxysmal atrial fibrillation ([name]) 11/07/2013  . Prostate cancer ([name]) 7/12   T1C Ad Ca  s/p TURP, observation only  . Renal insufficiency 09/06/2011  . Sinus node dysfunction ([place]) 08/14/2014  . Sleep apnea   . Smoldering multiple myeloma ([name])     Past Surgical History:  Procedure Laterality Date  . ADRENALECTOMY  02/12/11   Left, for Pheochromocytoma  . CARDIOVERSION N/A 12/26/2013   Procedure: CARDIOVERSION;  Surgeon: [name], MD;  Location: [facility];  Service:  Cardiovascular;  Laterality: N/A;  . CHOLECYSTECTOMY  02/12/11  . GASTRECTOMY  1984   S/P gastrectomy for ulcer  . KNEE SURGERY  1971   S/P Right knee surgery  . TEE WITHOUT CARDIOVERSION N/A 12/26/2013   Procedure: TRANSESOPHAGEAL ECHOCARDIOGRAM (TEE);  Surgeon: [name], MD;  Location: [facility];  Service: Cardiovascular;  Laterality: N/A;  . TRANSURETHRAL RESECTION OF PROSTATE  7/12    Current Medications: Current Meds  Medication Sig  . acetaminophen (TYLENOL) 500 MG tablet Take 1,000 mg by mouth as needed.   Marland Kitchen apixaban (ELIQUIS) 2.5 MG TABS tablet Take 1 tablet (2.5 mg total) by mouth 2 (two) times daily.  . Cholecalciferol (VITAMIN D3) 50 MCG (2000 UT) capsule Take 1 capsule by mouth daily.   Marland Kitchen diltiazem (CARDIZEM CD) 120 MG 24 hr capsule TAKE 1 CAPSULE BY MOUTH EVERY DAY  . dorzolamide-timolol (COSOPT) 22.3-6.8 MG/ML ophthalmic solution INSTILL 1 DROP INTO BOTH EYES TWICE A DAY  . latanoprost (XALATAN) 0.005 % ophthalmic solution INSTILL 1 DROP INTO BOTH EYES EVERY DAY AT NIGHT     Allergies:   Benadryl [diphenhydramine hcl], Ceftriaxone sodium, Contrast media [iodinated diagnostic agents], Nsaids, Penicillins, Levaquin [levofloxacin], and Chlorhexidine   Social History   Socioeconomic History  . Marital status: Married    Spouse name: Not on file  . Number of children: 3  . Years of education: Not on file  . Highest education level: Not on file  Occupational History    Employer: RETIRED  Social Needs  . Financial resource strain: Not on file  . Food insecurity    Worry: Not on file    Inability: Not on file  . Transportation needs    Medical: Not on file    Non-medical: Not on file  Tobacco Use  . Smoking status: Never Smoker  . Smokeless tobacco: Never Used  Substance and Sexual Activity  . Alcohol use: No  . Drug use: No  . Sexual activity: Not Currently  Lifestyle  . Physical activity    Days per week: Not on file    Minutes per session: Not on file   . Stress: Not on file  Relationships  . Social Herbalist on phone: Not on file    Gets together: Not on file    Attends religious service: Not on file    Active member of club or organization: Not on file    Attends meetings of clubs or organizations: Not on file    Relationship status: Not on file  Other Topics Concern  . Not on file  Social History Narrative  . Not on file     Family History: The patient's family history includes Kidney Stones in his brother; Liver disease in his sister, sister, and sister; Melanoma in his brother; Polymyositis in his mother; Prostate cancer in his brother and father.  ROS:   Please see the history of present illness.    Under stress at home because his wife fell and fractured her sternum and several cervical vertebrae.  This happened 6 weeks ago.  She is slightly improving at this time.  She is having much less pain.  All other systems reviewed and are negative.  EKGs/Labs/Other Studies Reviewed:    The following studies were reviewed today: No recent laboratory data other than creatinine noted to be 1.2 in November and hemoglobin 16.5.  Alkaline phosphatase was 14 in November 2020.  EKG:  EKG not repeated  Recent Labs: 08/13/2019: ALT 14; BUN 24; Creatinine 1.65; Hemoglobin 16.5; Platelet Count 173; Potassium 4.5; Sodium 142  Recent Lipid Panel No results found for: CHOL, TRIG, HDL, CHOLHDL, VLDL, LDLCALC, LDLDIRECT  Physical Exam:    VS:  BP (!) 144/72   Pulse 88   Ht 5' 5"  (1.651 m)   Wt 156 lb (70.8 kg)   SpO2 98%   BMI 25.96 kg/m     Wt Readings from Last 3 Encounters:  09/02/19 156 lb (70.8 kg)  08/20/19 153 lb 1.6 oz (69.4 kg)  08/15/19 154 lb (69.9 kg)     GEN: Mild obesity.  Appearance is compatible with age.. No acute distress HEENT: Normal NECK: No JVD. LYMPHATICS: No lymphadenopathy CARDIAC: Irregularly irregular RR with 2/6 decrescendo aortic regurgitation but no elevation at 144.  Murmur, gallop, or  edema. VASCULAR:  Normal Pulses. No bruits. RESPIRATORY:  Clear to auscultation without rales, wheezing or rhonchi  ABDOMEN: Soft, non-tender, non-distended, No pulsatile mass, MUSCULOSKELETAL: No deformity  SKIN: Warm and dry NEUROLOGIC:  Alert and oriented x 3 PSYCHIATRIC:  Normal affect   ASSESSMENT:    1. Chronic diastolic heart failure ([place])   2. Permanent atrial fibrillation   3. Chronic anticoagulation   4. OSA on CPAP   5. Essential hypertension   6. Educated about COVID-19 virus infection   7. Mild aortic stenosis   8. Hypercholesterolemia    PLAN:    In order of problems listed above no clinical evidence of volume overload:  1. No clinical  evidence of volume overload 2. Controlled rate 3. No bleeding complications 4. Compliant 5. Slight systolic elevation with target being 140 mmHg at his age. 6. 3W's is understood and being practiced to prevent Covid infection 7. Diastolic murmurs heard today. 8. Lipids are adequate and will be checked in 6 months.  24-monthclinical follow-up with hemoglobin, creatinine, and lipid panel.  We will see how blood pressure is at that time.  Widening pulse pressure is related to aortic regurgitation.   Medication Adjustments/Labs and Tests Ordered: Current medicines are reviewed at length with the patient today.  Concerns regarding medicines are outlined above.  Orders Placed This Encounter  Procedures  . CBC  . Basic metabolic panel  . Lipid panel   No orders of the defined types were placed in this encounter.   Patient Instructions  Medication Instructions:  Your physician recommends that you continue on your current medications as directed. Please refer to the Current Medication list given to you today.  *If you need a refill on your cardiac medications before your next appointment, please call your pharmacy*  Lab Work: CBC, BMET and Lipid in 6 months.  You will need to be fasting for these labs (nothing to eat or drink  after midnight except water and black coffee).   If you have labs (blood work) drawn today and your tests are completely normal, you will receive your results only by: .Marland KitchenMyChart Message (if you have MyChart) OR . A paper copy in the mail If you have any lab test that is abnormal or we need to change your treatment, we will call you to review the results.  Testing/Procedures: None  Follow-Up:  Your physician recommends that you schedule a follow-up appointment in: 6 months with a PA or NP.   [facility] and your health needs are our priority.  As part of our continuing mission to provide you with exceptional heart care, we have created designated Provider Care Teams.  These Care Teams include your primary Cardiologist (physician) and Advanced Practice Providers (APPs -  Physician Assistants and Nurse Practitioners) who all work together to provide you with the care you need, when you need it.  Your next appointment:   12 month(s)  The format for your next appointment:   In Person  Provider:   You may see HSinclair Grooms MD or one of the following Advanced Practice Providers on your designated Care Team:    LTruitt Merle NP  LCecilie Kicks NP  JKathyrn Drown NP   Other Instructions      Signed, HSinclair Grooms MD  09/02/2019 10:29 AM    CE[place]

## 2019-09-02 ENCOUNTER — Ambulatory Visit: Payer: Medicare Other | Admitting: Interventional Cardiology

## 2019-09-02 ENCOUNTER — Encounter: Payer: Self-pay | Admitting: Interventional Cardiology

## 2019-09-02 ENCOUNTER — Other Ambulatory Visit: Payer: Self-pay

## 2019-09-02 VITALS — BP 144/72 | HR 88 | Ht 65.0 in | Wt 156.0 lb

## 2019-09-02 DIAGNOSIS — Z008 Encounter for other general examination: Secondary | ICD-10-CM | POA: Diagnosis not present

## 2019-09-02 DIAGNOSIS — Z7901 Long term (current) use of anticoagulants: Secondary | ICD-10-CM | POA: Diagnosis not present

## 2019-09-02 DIAGNOSIS — I5032 Chronic diastolic (congestive) heart failure: Secondary | ICD-10-CM

## 2019-09-02 DIAGNOSIS — I10 Essential (primary) hypertension: Secondary | ICD-10-CM

## 2019-09-02 DIAGNOSIS — Z7189 Other specified counseling: Secondary | ICD-10-CM

## 2019-09-02 DIAGNOSIS — Z9989 Dependence on other enabling machines and devices: Secondary | ICD-10-CM

## 2019-09-02 DIAGNOSIS — I350 Nonrheumatic aortic (valve) stenosis: Secondary | ICD-10-CM

## 2019-09-02 DIAGNOSIS — E7800 Pure hypercholesterolemia, unspecified: Secondary | ICD-10-CM

## 2019-09-02 DIAGNOSIS — G4733 Obstructive sleep apnea (adult) (pediatric): Secondary | ICD-10-CM

## 2019-09-02 NOTE — Patient Instructions (Signed)
Medication Instructions:  Your physician recommends that you continue on your current medications as directed. Please refer to the Current Medication list given to you today.  *If you need a refill on your cardiac medications before your next appointment, please call your pharmacy*  Lab Work: CBC, BMET and Lipid in 6 months.  You will need to be fasting for these labs (nothing to eat or drink after midnight except water and black coffee).   If you have labs (blood work) drawn today and your tests are completely normal, you will receive your results only by: Marland Kitchen MyChart Message (if you have MyChart) OR . A paper copy in the mail If you have any lab test that is abnormal or we need to change your treatment, we will call you to review the results.  Testing/Procedures: None  Follow-Up:  Your physician recommends that you schedule a follow-up appointment in: 6 months with a PA or NP.   At [facility], you and your health needs are our priority.  As part of our continuing mission to provide you with exceptional heart care, we have created designated Provider Care Teams.  These Care Teams include your primary Cardiologist (physician) and Advanced Practice Providers (APPs -  Physician Assistants and Nurse Practitioners) who all work together to provide you with the care you need, when you need it.  Your next appointment:   12 month(s)  The format for your next appointment:   In Person  Provider:   You may see [name], MD or one of the following Advanced Practice Providers on your designated Care Team:    Truitt Merle, NP  Cecilie Kicks, NP  Kathyrn Drown, NP   Other Instructions

## 2019-11-14 ENCOUNTER — Other Ambulatory Visit: Payer: Self-pay

## 2019-11-14 ENCOUNTER — Inpatient Hospital Stay: Payer: Medicare Other | Attending: Internal Medicine

## 2019-11-14 DIAGNOSIS — Z881 Allergy status to other antibiotic agents status: Secondary | ICD-10-CM | POA: Diagnosis not present

## 2019-11-14 DIAGNOSIS — I130 Hypertensive heart and chronic kidney disease with heart failure and stage 1 through stage 4 chronic kidney disease, or unspecified chronic kidney disease: Secondary | ICD-10-CM | POA: Diagnosis not present

## 2019-11-14 DIAGNOSIS — Z79899 Other long term (current) drug therapy: Secondary | ICD-10-CM | POA: Insufficient documentation

## 2019-11-14 DIAGNOSIS — G629 Polyneuropathy, unspecified: Secondary | ICD-10-CM | POA: Insufficient documentation

## 2019-11-14 DIAGNOSIS — N189 Chronic kidney disease, unspecified: Secondary | ICD-10-CM | POA: Insufficient documentation

## 2019-11-14 DIAGNOSIS — E7800 Pure hypercholesterolemia, unspecified: Secondary | ICD-10-CM | POA: Insufficient documentation

## 2019-11-14 DIAGNOSIS — Z886 Allergy status to analgesic agent status: Secondary | ICD-10-CM | POA: Insufficient documentation

## 2019-11-14 DIAGNOSIS — Z7901 Long term (current) use of anticoagulants: Secondary | ICD-10-CM | POA: Diagnosis not present

## 2019-11-14 DIAGNOSIS — M25559 Pain in unspecified hip: Secondary | ICD-10-CM | POA: Diagnosis not present

## 2019-11-14 DIAGNOSIS — C61 Malignant neoplasm of prostate: Secondary | ICD-10-CM | POA: Diagnosis not present

## 2019-11-14 DIAGNOSIS — Z888 Allergy status to other drugs, medicaments and biological substances status: Secondary | ICD-10-CM | POA: Diagnosis not present

## 2019-11-14 DIAGNOSIS — Z9079 Acquired absence of other genital organ(s): Secondary | ICD-10-CM | POA: Diagnosis not present

## 2019-11-14 DIAGNOSIS — C9000 Multiple myeloma not having achieved remission: Secondary | ICD-10-CM | POA: Insufficient documentation

## 2019-11-14 DIAGNOSIS — Z880 Allergy status to penicillin: Secondary | ICD-10-CM | POA: Insufficient documentation

## 2019-11-14 DIAGNOSIS — I5032 Chronic diastolic (congestive) heart failure: Secondary | ICD-10-CM | POA: Insufficient documentation

## 2019-11-14 LAB — CBC WITH DIFFERENTIAL (CANCER CENTER ONLY)
Abs Immature Granulocytes: 0.03 10*3/uL (ref 0.00–0.07)
Basophils Absolute: 0 10*3/uL (ref 0.0–0.1)
Basophils Relative: 0 %
Eosinophils Absolute: 0.1 10*3/uL (ref 0.0–0.5)
Eosinophils Relative: 2 %
HCT: 47.6 % (ref 39.0–52.0)
Hemoglobin: 16.1 g/dL (ref 13.0–17.0)
Immature Granulocytes: 1 %
Lymphocytes Relative: 26 %
Lymphs Abs: 1.7 10*3/uL (ref 0.7–4.0)
MCH: 31.6 pg (ref 26.0–34.0)
MCHC: 33.8 g/dL (ref 30.0–36.0)
MCV: 93.3 fL (ref 80.0–100.0)
Monocytes Absolute: 0.5 10*3/uL (ref 0.1–1.0)
Monocytes Relative: 8 %
Neutro Abs: 4 10*3/uL (ref 1.7–7.7)
Neutrophils Relative %: 63 %
Platelet Count: 172 10*3/uL (ref 150–400)
RBC: 5.1 MIL/uL (ref 4.22–5.81)
RDW: 13.2 % (ref 11.5–15.5)
WBC Count: 6.4 10*3/uL (ref 4.0–10.5)
nRBC: 0 % (ref 0.0–0.2)

## 2019-11-14 LAB — CMP (CANCER CENTER ONLY)
ALT: 16 U/L (ref 0–44)
AST: 17 U/L (ref 15–41)
Albumin: 3.8 g/dL (ref 3.5–5.0)
Alkaline Phosphatase: 105 U/L (ref 38–126)
Anion gap: 9 (ref 5–15)
BUN: 26 mg/dL — ABNORMAL HIGH (ref 8–23)
CO2: 25 mmol/L (ref 22–32)
Calcium: 9.6 mg/dL (ref 8.9–10.3)
Chloride: 110 mmol/L (ref 98–111)
Creatinine: 1.53 mg/dL — ABNORMAL HIGH (ref 0.61–1.24)
GFR, Est AFR Am: 48 mL/min — ABNORMAL LOW (ref >60)
GFR, Estimated: 42 mL/min — ABNORMAL LOW (ref >60)
Glucose, Bld: 132 mg/dL — ABNORMAL HIGH (ref 70–99)
Potassium: 4.1 mmol/L (ref 3.5–5.1)
Sodium: 144 mmol/L (ref 135–145)
Total Bilirubin: 0.8 mg/dL (ref 0.3–1.2)
Total Protein: 6.2 g/dL — ABNORMAL LOW (ref 6.5–8.1)

## 2019-11-14 LAB — LACTATE DEHYDROGENASE: LDH: 165 U/L (ref 98–192)

## 2019-11-15 LAB — IGG, IGA, IGM
IgA: 53 mg/dL — ABNORMAL LOW (ref 61–437)
IgG (Immunoglobin G), Serum: 782 mg/dL (ref 603–1613)
IgM (Immunoglobulin M), Srm: 25 mg/dL (ref 15–143)

## 2019-11-15 LAB — KAPPA/LAMBDA LIGHT CHAINS
Kappa free light chain: 26 mg/L — ABNORMAL HIGH (ref 3.3–19.4)
Kappa, lambda light chain ratio: 1.86 — ABNORMAL HIGH (ref 0.26–1.65)
Lambda free light chains: 14 mg/L (ref 5.7–26.3)

## 2019-11-15 LAB — BETA 2 MICROGLOBULIN, SERUM: Beta-2 Microglobulin: 2.9 mg/L — ABNORMAL HIGH (ref 0.6–2.4)

## 2019-11-20 ENCOUNTER — Telehealth: Payer: Self-pay | Admitting: Medical Oncology

## 2019-11-20 NOTE — Telephone Encounter (Signed)
I told pwe open at 100 tomorrow and if he wants a virtual visit we can arrange that . LVM  to call me back.

## 2019-11-21 ENCOUNTER — Inpatient Hospital Stay: Payer: Medicare Other | Admitting: Internal Medicine

## 2019-11-21 ENCOUNTER — Encounter: Payer: Self-pay | Admitting: Internal Medicine

## 2019-11-21 ENCOUNTER — Other Ambulatory Visit: Payer: Self-pay

## 2019-11-21 ENCOUNTER — Telehealth: Payer: Self-pay | Admitting: Internal Medicine

## 2019-11-21 VITALS — BP 144/90 | HR 78 | Temp 97.8°F | Resp 20 | Ht 65.0 in | Wt 163.0 lb

## 2019-11-21 DIAGNOSIS — C9000 Multiple myeloma not having achieved remission: Secondary | ICD-10-CM | POA: Diagnosis not present

## 2019-11-21 DIAGNOSIS — I10 Essential (primary) hypertension: Secondary | ICD-10-CM | POA: Diagnosis not present

## 2019-11-21 DIAGNOSIS — N181 Chronic kidney disease, stage 1: Secondary | ICD-10-CM

## 2019-11-21 NOTE — Telephone Encounter (Signed)
Scheduled appt per 2/18 los - gave patient AVS  And calender

## 2019-11-21 NOTE — Progress Notes (Signed)
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 2) History of pheochromocytoma LEFT adrenal gland status post resection on 03/16/2009 3) Prostate adenocarcinoma  PRIOR THERAPY: Systemic therapy with weekly Velcade 1.3 mg/KG subcutaneously, Revlimid 25 mg p.o. daily for 21 days every 4 weeks as well as Decadron 20 mg p.o. weekly.  First dose November 07, 2017.  Status post 7 cycles.  CURRENT THERAPY: Observation.  INTERVAL HISTORY: Thomas [name] 85 y.o. male returns to the clinic today for follow-up visit.  The patient is feeling fine today with no concerning complaints except for aching pain in the hip and knees secondary to arthritis.  He denied having any current chest pain, shortness of breath, cough or hemoptysis.  He denied having any fever or chills.  He has no nausea, vomiting, diarrhea or constipation.  He has no headache or visual changes.  He had repeat myeloma panel performed recently and he is here for evaluation and discussion of his lab results.  MEDICAL HISTORY: Past Medical History:  Diagnosis Date  . BPH (benign prostatic hypertrophy)   . Chronic diastolic heart failure ([place]) 12/27/2013  . Chronic kidney disease    Hx of bilateral renal cysts.  . Encounter for antineoplastic chemotherapy 02/20/2018  . Glaucoma   . Heart murmur   . History of atrial fibrillation 03/09/09  . Hx of epiglottitis    2011  . Hypercholesterolemia   . Hypertension   . Long term current use of amiodarone 11/07/2013   Amiodarone   . Mild aortic stenosis   . OSA on CPAP 06/26/2014  . Paroxysmal atrial fibrillation ([place]) 11/07/2013  . Prostate cancer ([place]) 7/12   T1C Ad Ca  s/p TURP, observation only  . Renal insufficiency 09/06/2011  . Sinus  node dysfunction ([place]) 08/14/2014  . Sleep apnea   . 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  MEDICATIONS:  Current Outpatient Medications  Medication Sig Dispense Refill  . acetaminophen (TYLENOL) 500 MG tablet Take 1,000 mg by mouth as needed.     Marland Kitchen apixaban (ELIQUIS) 2.5 MG TABS tablet Take 1 tablet (2.5 mg total) by mouth 2 (two) times daily. 180 tablet 2  . Cholecalciferol (VITAMIN D3) 50 MCG (2000 UT) capsule Take 1 capsule by mouth daily.     Marland Kitchen diltiazem (CARDIZEM CD) 120 MG 24 hr capsule TAKE 1 CAPSULE BY MOUTH EVERY DAY 90 capsule 1  . dorzolamide-timolol (COSOPT) 22.3-6.8 MG/ML ophthalmic solution INSTILL 1 DROP INTO BOTH EYES TWICE A DAY    . latanoprost (XALATAN) 0.005 % ophthalmic solution INSTILL 1 DROP INTO BOTH EYES EVERY DAY AT NIGHT  6   No current facility-administered medications for this visit.    SURGICAL HISTORY:  Past Surgical History:  Procedure Laterality Date  . ADRENALECTOMY  02/12/11   Left, for Pheochromocytoma  . CARDIOVERSION N/A 12/26/2013   Procedure: CARDIOVERSION;  Surgeon: [name], MD;  Location: [facility];  Service: Cardiovascular;  Laterality: N/A;  . CHOLECYSTECTOMY  02/12/11  . GASTRECTOMY  1984   S/P gastrectomy for ulcer  . KNEE SURGERY  1971   S/P Right knee surgery  .  TEE WITHOUT CARDIOVERSION N/A 12/26/2013   Procedure: TRANSESOPHAGEAL ECHOCARDIOGRAM (TEE);  Surgeon: [name], MD;  Location: [facility];  Service: Cardiovascular;  Laterality: N/A;  . TRANSURETHRAL RESECTION OF PROSTATE  7/12    REVIEW OF SYSTEMS:  A comprehensive review of systems was negative except for: Musculoskeletal: positive for arthralgias   PHYSICAL EXAMINATION: General appearance: alert, cooperative and no distress Head: Normocephalic, without obvious  abnormality, atraumatic Neck: no adenopathy, no JVD, supple, symmetrical, trachea midline and thyroid not enlarged, symmetric, no tenderness/mass/nodules Lymph nodes: Cervical, supraclavicular, and axillary nodes normal. Resp: clear to auscultation bilaterally Back: symmetric, no curvature. ROM normal. No CVA tenderness. Cardio: regular rate and rhythm, S1, S2 normal, no murmur, click, rub or gallop GI: soft, non-tender; bowel sounds normal; no masses,  no organomegaly Extremities: extremities normal, atraumatic, no cyanosis or edema  ECOG PERFORMANCE STATUS: 1 - Symptomatic but completely ambulatory  Blood pressure (!) 144/90, pulse 78, temperature 97.8 F (36.6 C), temperature source Temporal, resp. rate 20, height 5' 5"  (1.651 m), weight 163 lb (73.9 kg), SpO2 100 %.  LABORATORY DATA: Lab Results  Component Value Date   WBC 6.4 11/14/2019   HGB 16.1 11/14/2019   HCT 47.6 11/14/2019   MCV 93.3 11/14/2019   PLT 172 11/14/2019      Chemistry      Component Value Date/Time   NA 144 11/14/2019 1035   NA 138 08/16/2017 1404   K 4.1 11/14/2019 1035   K 4.5 08/16/2017 1404   CL 110 11/14/2019 1035   CL 109 (H) 03/21/2013 0958   CO2 25 11/14/2019 1035   CO2 24 08/16/2017 1404   BUN 26 (H) 11/14/2019 1035   BUN 17.3 08/16/2017 1404   CREATININE 1.53 (H) 11/14/2019 1035   CREATININE 1.8 (H) 08/16/2017 1404      Component Value Date/Time   CALCIUM 9.6 11/14/2019 1035   CALCIUM 9.9 08/16/2017 1404   ALKPHOS 105 11/14/2019 1035   ALKPHOS 86 08/16/2017 1404   AST 17 11/14/2019 1035   AST 22 08/16/2017 1404   ALT 16 11/14/2019 1035   ALT 16 08/16/2017 1404   BILITOT 0.8 11/14/2019 1035   BILITOT 0.78 08/16/2017 1404       RADIOGRAPHIC STUDIES: No results found.  ASSESSMENT AND PLAN:  This is a very pleasant 85 years old white male with IgG smoldering Loma diagnosed in April 2011. The patient has been on observation since 2011. His recent myeloma panel showed further  increase in the free kappa light chain. His recent skeletal bone survey showed no concerning findings for disease progression.  He has a focus of avascular necrosis but no significant lytic lesions. The recent bone marrow biopsy and aspirate showed further increase in the plasma cells to 15%. The patient underwent treatment with subcutaneous weekly Velcade as well as Revlimid and Decadron status post 7 cycles. He tolerated the previous treatment well except for mild peripheral neuropathy. The patient is currently on observation and he is feeling fine today with no concerning complaints. He had repeat myeloma panel performed recently.  There is no evidence for disease progression on the myeloma panel. I recommended for the patient to continue on observation with repeat myeloma panel in 3 months. For the prostate cancer, he is followed by Dr. Alinda Money.   For the hypertension, I recommended for the patient to take his blood pressure medications as prescribed and to monitor it closely at home. He was advised to call immediately if he has any concerning  symptoms in the interval. The patient voices understanding of current disease status and treatment options and is in agreement with the current care plan. All questions were answered. The patient knows to call the clinic with any problems, questions or concerns. We can certainly see the patient much sooner if necessary.  Disclaimer: This note was dictated with voice recognition software. Similar sounding words can inadvertently be transcribed and may not be corrected upon review.

## 2019-12-09 ENCOUNTER — Other Ambulatory Visit: Payer: Self-pay | Admitting: Interventional Cardiology

## 2019-12-24 DIAGNOSIS — H9113 Presbycusis, bilateral: Secondary | ICD-10-CM | POA: Insufficient documentation

## 2020-02-11 ENCOUNTER — Other Ambulatory Visit: Payer: Self-pay

## 2020-02-11 ENCOUNTER — Inpatient Hospital Stay: Payer: Medicare Other | Attending: Internal Medicine

## 2020-02-11 DIAGNOSIS — I130 Hypertensive heart and chronic kidney disease with heart failure and stage 1 through stage 4 chronic kidney disease, or unspecified chronic kidney disease: Secondary | ICD-10-CM | POA: Insufficient documentation

## 2020-02-11 DIAGNOSIS — Z79899 Other long term (current) drug therapy: Secondary | ICD-10-CM | POA: Insufficient documentation

## 2020-02-11 DIAGNOSIS — Z886 Allergy status to analgesic agent status: Secondary | ICD-10-CM | POA: Insufficient documentation

## 2020-02-11 DIAGNOSIS — G629 Polyneuropathy, unspecified: Secondary | ICD-10-CM | POA: Insufficient documentation

## 2020-02-11 DIAGNOSIS — Z7901 Long term (current) use of anticoagulants: Secondary | ICD-10-CM | POA: Insufficient documentation

## 2020-02-11 DIAGNOSIS — C9000 Multiple myeloma not having achieved remission: Secondary | ICD-10-CM

## 2020-02-11 DIAGNOSIS — Z881 Allergy status to other antibiotic agents status: Secondary | ICD-10-CM | POA: Insufficient documentation

## 2020-02-11 DIAGNOSIS — Z880 Allergy status to penicillin: Secondary | ICD-10-CM | POA: Insufficient documentation

## 2020-02-11 DIAGNOSIS — M2550 Pain in unspecified joint: Secondary | ICD-10-CM | POA: Insufficient documentation

## 2020-02-11 DIAGNOSIS — Z9079 Acquired absence of other genital organ(s): Secondary | ICD-10-CM | POA: Insufficient documentation

## 2020-02-11 DIAGNOSIS — Z888 Allergy status to other drugs, medicaments and biological substances status: Secondary | ICD-10-CM | POA: Insufficient documentation

## 2020-02-11 DIAGNOSIS — Z8546 Personal history of malignant neoplasm of prostate: Secondary | ICD-10-CM | POA: Insufficient documentation

## 2020-02-11 DIAGNOSIS — N189 Chronic kidney disease, unspecified: Secondary | ICD-10-CM | POA: Diagnosis not present

## 2020-02-11 DIAGNOSIS — E7800 Pure hypercholesterolemia, unspecified: Secondary | ICD-10-CM | POA: Diagnosis not present

## 2020-02-11 DIAGNOSIS — I5032 Chronic diastolic (congestive) heart failure: Secondary | ICD-10-CM | POA: Diagnosis not present

## 2020-02-11 LAB — CBC WITH DIFFERENTIAL (CANCER CENTER ONLY)
Abs Immature Granulocytes: 0.03 10*3/uL (ref 0.00–0.07)
Basophils Absolute: 0 10*3/uL (ref 0.0–0.1)
Basophils Relative: 1 %
Eosinophils Absolute: 0.2 10*3/uL (ref 0.0–0.5)
Eosinophils Relative: 3 %
HCT: 47 % (ref 39.0–52.0)
Hemoglobin: 15.9 g/dL (ref 13.0–17.0)
Immature Granulocytes: 1 %
Lymphocytes Relative: 31 %
Lymphs Abs: 2.1 10*3/uL (ref 0.7–4.0)
MCH: 31.7 pg (ref 26.0–34.0)
MCHC: 33.8 g/dL (ref 30.0–36.0)
MCV: 93.6 fL (ref 80.0–100.0)
Monocytes Absolute: 0.6 10*3/uL (ref 0.1–1.0)
Monocytes Relative: 9 %
Neutro Abs: 3.7 10*3/uL (ref 1.7–7.7)
Neutrophils Relative %: 55 %
Platelet Count: 196 10*3/uL (ref 150–400)
RBC: 5.02 MIL/uL (ref 4.22–5.81)
RDW: 14.1 % (ref 11.5–15.5)
WBC Count: 6.6 10*3/uL (ref 4.0–10.5)
nRBC: 0 % (ref 0.0–0.2)

## 2020-02-11 LAB — LACTATE DEHYDROGENASE: LDH: 166 U/L (ref 98–192)

## 2020-02-11 LAB — CMP (CANCER CENTER ONLY)
ALT: 15 U/L (ref 0–44)
AST: 18 U/L (ref 15–41)
Albumin: 3.8 g/dL (ref 3.5–5.0)
Alkaline Phosphatase: 115 U/L (ref 38–126)
Anion gap: 7 (ref 5–15)
BUN: 23 mg/dL (ref 8–23)
CO2: 25 mmol/L (ref 22–32)
Calcium: 10 mg/dL (ref 8.9–10.3)
Chloride: 109 mmol/L (ref 98–111)
Creatinine: 1.5 mg/dL — ABNORMAL HIGH (ref 0.61–1.24)
GFR, Est AFR Am: 49 mL/min — ABNORMAL LOW (ref >60)
GFR, Estimated: 42 mL/min — ABNORMAL LOW (ref >60)
Glucose, Bld: 108 mg/dL — ABNORMAL HIGH (ref 70–99)
Potassium: 4.1 mmol/L (ref 3.5–5.1)
Sodium: 141 mmol/L (ref 135–145)
Total Bilirubin: 0.7 mg/dL (ref 0.3–1.2)
Total Protein: 6.3 g/dL — ABNORMAL LOW (ref 6.5–8.1)

## 2020-02-12 LAB — KAPPA/LAMBDA LIGHT CHAINS
Kappa free light chain: 33.2 mg/L — ABNORMAL HIGH (ref 3.3–19.4)
Kappa, lambda light chain ratio: 2.48 — ABNORMAL HIGH (ref 0.26–1.65)
Lambda free light chains: 13.4 mg/L (ref 5.7–26.3)

## 2020-02-12 LAB — IGG, IGA, IGM
IgA: 55 mg/dL — ABNORMAL LOW (ref 61–437)
IgG (Immunoglobin G), Serum: 800 mg/dL (ref 603–1613)
IgM (Immunoglobulin M), Srm: 27 mg/dL (ref 15–143)

## 2020-02-12 LAB — BETA 2 MICROGLOBULIN, SERUM: Beta-2 Microglobulin: 2.8 mg/L — ABNORMAL HIGH (ref 0.6–2.4)

## 2020-02-18 ENCOUNTER — Other Ambulatory Visit: Payer: Self-pay

## 2020-02-18 ENCOUNTER — Encounter: Payer: Self-pay | Admitting: Internal Medicine

## 2020-02-18 ENCOUNTER — Inpatient Hospital Stay: Payer: Medicare Other | Admitting: Internal Medicine

## 2020-02-18 VITALS — BP 137/77 | HR 84 | Temp 97.8°F | Resp 18 | Ht 65.0 in | Wt 168.0 lb

## 2020-02-18 DIAGNOSIS — C9000 Multiple myeloma not having achieved remission: Secondary | ICD-10-CM | POA: Diagnosis not present

## 2020-02-18 NOTE — Progress Notes (Signed)
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 2) History of pheochromocytoma LEFT adrenal gland status post resection on 03/16/2009 3) Prostate adenocarcinoma  PRIOR THERAPY: Systemic therapy with weekly Velcade 1.3 mg/KG subcutaneously, Revlimid 25 mg p.o. daily for 21 days every 4 weeks as well as Decadron 20 mg p.o. weekly.  First dose November 07, 2017.  Status post 7 cycles.  CURRENT THERAPY: Observation.  INTERVAL HISTORY: Thomas [name] 85 y.o. male returns to the clinic today for 3 months follow-up visit.  The patient is feeling fine today with no concerning complaints.  He denied having any significant chest pain, shortness of breath, cough or hemoptysis.  He denied having any fever or chills.  He has no nausea, vomiting, diarrhea or constipation.  He has no headache or visual changes.  The patient had repeat myeloma panel and he is here today for evaluation and discussion of his lab results.   MEDICAL HISTORY: Past Medical History:  Diagnosis Date  . BPH (benign prostatic hypertrophy)   . Chronic diastolic heart failure ([place]) 12/27/2013  . Chronic kidney disease    Hx of bilateral renal cysts.  . Encounter for antineoplastic chemotherapy 02/20/2018  . Glaucoma   . Heart murmur   . History of atrial fibrillation 03/09/09  . Hx of epiglottitis    2011  . Hypercholesterolemia   . Hypertension   . Long term current use of amiodarone 11/07/2013   Amiodarone   . Mild aortic stenosis   . OSA on CPAP 06/26/2014  . Paroxysmal atrial fibrillation ([place]) 11/07/2013  . Prostate cancer ([place]) 7/12   T1C Ad Ca  s/p TURP, observation only  . Renal insufficiency 09/06/2011  . Sinus node dysfunction ([place]) 08/14/2014  . Sleep apnea   .  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  MEDICATIONS:  Current Outpatient Medications  Medication Sig Dispense Refill  . acetaminophen (TYLENOL) 500 MG tablet Take 1,000 mg by mouth as needed.     Marland Kitchen apixaban (ELIQUIS) 2.5 MG TABS tablet Take 1 tablet (2.5 mg total) by mouth 2 (two) times daily. 180 tablet 2  . Cholecalciferol (VITAMIN D3) 50 MCG (2000 UT) capsule Take 1 capsule by mouth daily.     Marland Kitchen diltiazem (CARDIZEM CD) 120 MG 24 hr capsule TAKE 1 CAPSULE BY MOUTH EVERY DAY 90 capsule 2  . dorzolamide-timolol (COSOPT) 22.3-6.8 MG/ML ophthalmic solution INSTILL 1 DROP INTO BOTH EYES TWICE A DAY    . latanoprost (XALATAN) 0.005 % ophthalmic solution INSTILL 1 DROP INTO BOTH EYES EVERY DAY AT NIGHT  6   No current facility-administered medications for this visit.    SURGICAL HISTORY:  Past Surgical History:  Procedure Laterality Date  . ADRENALECTOMY  02/12/11   Left, for Pheochromocytoma  . CARDIOVERSION N/A 12/26/2013   Procedure: CARDIOVERSION;  Surgeon: [name], MD;  Location: [facility];  Service: Cardiovascular;  Laterality: N/A;  . CHOLECYSTECTOMY  02/12/11  . GASTRECTOMY  1984   S/P gastrectomy for ulcer  . KNEE SURGERY  1971   S/P Right knee surgery  . TEE WITHOUT CARDIOVERSION N/A 12/26/2013   Procedure:  TRANSESOPHAGEAL ECHOCARDIOGRAM (TEE);  Surgeon: [name], MD;  Location: [facility];  Service: Cardiovascular;  Laterality: N/A;  . TRANSURETHRAL RESECTION OF PROSTATE  7/12    REVIEW OF SYSTEMS:  A comprehensive review of systems was negative except for: Musculoskeletal: positive for arthralgias   PHYSICAL EXAMINATION: General appearance: alert, cooperative and no distress Head: Normocephalic, without obvious abnormality, atraumatic Neck: no adenopathy, no JVD, supple,  symmetrical, trachea midline and thyroid not enlarged, symmetric, no tenderness/mass/nodules Lymph nodes: Cervical, supraclavicular, and axillary nodes normal. Resp: clear to auscultation bilaterally Back: symmetric, no curvature. ROM normal. No CVA tenderness. Cardio: regular rate and rhythm, S1, S2 normal, no murmur, click, rub or gallop GI: soft, non-tender; bowel sounds normal; no masses,  no organomegaly Extremities: extremities normal, atraumatic, no cyanosis or edema  ECOG PERFORMANCE STATUS: 1 - Symptomatic but completely ambulatory  Blood pressure 137/77, pulse 84, temperature 97.8 F (36.6 C), temperature source Temporal, resp. rate 18, height 5' 5"  (1.651 m), weight 168 lb (76.2 kg), SpO2 100 %.  LABORATORY DATA: Lab Results  Component Value Date   WBC 6.6 02/11/2020   HGB 15.9 02/11/2020   HCT 47.0 02/11/2020   MCV 93.6 02/11/2020   PLT 196 02/11/2020      Chemistry      Component Value Date/Time   NA 141 02/11/2020 1105   NA 138 08/16/2017 1404   K 4.1 02/11/2020 1105   K 4.5 08/16/2017 1404   CL 109 02/11/2020 1105   CL 109 (H) 03/21/2013 0958   CO2 25 02/11/2020 1105   CO2 24 08/16/2017 1404   BUN 23 02/11/2020 1105   BUN 17.3 08/16/2017 1404   CREATININE 1.50 (H) 02/11/2020 1105   CREATININE 1.8 (H) 08/16/2017 1404      Component Value Date/Time   CALCIUM 10.0 02/11/2020 1105   CALCIUM 9.9 08/16/2017 1404   ALKPHOS 115 02/11/2020 1105   ALKPHOS 86 08/16/2017 1404   AST 18 02/11/2020 1105   AST 22 08/16/2017 1404   ALT 15 02/11/2020 1105   ALT 16 08/16/2017 1404   BILITOT 0.7 02/11/2020 1105   BILITOT 0.78 08/16/2017 1404       RADIOGRAPHIC STUDIES: No results found.  ASSESSMENT AND PLAN:  This is a very pleasant 85 years old white male with IgG smoldering Loma diagnosed in April 2011. The patient has been on observation since 2011. His recent myeloma panel showed further increase in the free kappa light chain. His recent skeletal bone survey  showed no concerning findings for disease progression.  He has a focus of avascular necrosis but no significant lytic lesions. The recent bone marrow biopsy and aspirate showed further increase in the plasma cells to 15%. The patient underwent treatment with subcutaneous weekly Velcade as well as Revlimid and Decadron status post 7 cycles. He tolerated the previous treatment well except for mild peripheral neuropathy. He is currently on observation and doing fine with no concerning complaints. He had repeat myeloma panel.  I discussed the lab results with the patient.  He has very mild increase in the free kappa light chain. I recommended for him to continue on observation with repeat myeloma panel in 3 months. The patient was advised to call immediately if he has any concerning symptoms in the interval. The patient voices understanding of current disease status and treatment options and is in agreement with the current care plan. All questions were answered. The patient knows to call the clinic with any problems, questions or concerns. We  can certainly see the patient much sooner if necessary.  Disclaimer: This note was dictated with voice recognition software. Similar sounding words can inadvertently be transcribed and may not be corrected upon review.

## 2020-02-19 NOTE — Progress Notes (Signed)
CARDIOLOGY OFFICE NOTE  Date:  02/25/2020    Thomas [name] Date of Birth: July 01, 1935 Medical Record #[id number]  PCP:  [name], MD  Cardiologist:  Tamala Julian    Chief Complaint  Patient presents with  . Follow-up    Seen for Dr. Tamala Julian    History of Present Illness: Thomas [name] is a 85 y.o. male who presents today for a 6 month check. Seen for Dr. Tamala Julian.   He has a history of PAF, chronic diastolic heart failure, HTN, CKD, not able to use anticoagulation therapy due to recurrent GU bleeding - however now noted to be on Eliquis, and mild aortic stenosis.Other medical issues include metastatic prostate CA ([place]) and indolent multiple myeloma.   He was last seen back in November of 2020 by Dr. Tamala Julian - no problems with his Eliquis noted.   The patient does not have symptoms concerning for COVID-19 infection (fever, chills, cough, or new shortness of breath).   Comes in today. Here alone. He feels like he has been doing well. He has been taking care of his wife - she fell out of the bed - broke her neck and her sternum - she has recovered. He is doing ok. Had labs last week at the [facility]. Just needs his lipids. No chest pain. Not short of breath. No passing out. No bleeding. On the reduced dose of Eliquis. Tolerating his medicines. Overall, no real concerns. BP typically running 120 to 130's at home. He has not had his medicines today.   Past Medical History:  Diagnosis Date  . BPH (benign prostatic hypertrophy)   . Chronic diastolic heart failure ([place]) 12/27/2013  . Chronic kidney disease    Hx of bilateral renal cysts.  . Encounter for antineoplastic chemotherapy 02/20/2018  . Glaucoma   . Heart murmur   . History of atrial fibrillation 03/09/09  . Hx of epiglottitis    2011  . Hypercholesterolemia   . Hypertension   . Long term current use of amiodarone 11/07/2013   Amiodarone   . Mild aortic stenosis   . OSA on CPAP 06/26/2014  . Paroxysmal atrial  fibrillation ([place]) 11/07/2013  . Prostate cancer ([place]) 7/12   T1C Ad Ca  s/p TURP, observation only  . Renal insufficiency 09/06/2011  . Sinus node dysfunction ([place]) 08/14/2014  . Sleep apnea   . Smoldering multiple myeloma ([place])     Past Surgical History:  Procedure Laterality Date  . ADRENALECTOMY  02/12/11   Left, for Pheochromocytoma  . CARDIOVERSION N/A 12/26/2013   Procedure: CARDIOVERSION;  Surgeon: [name], MD;  Location: [facility];  Service: Cardiovascular;  Laterality: N/A;  . CHOLECYSTECTOMY  02/12/11  . GASTRECTOMY  1984   S/P gastrectomy for ulcer  . KNEE SURGERY  1971   S/P Right knee surgery  . TEE WITHOUT CARDIOVERSION N/A 12/26/2013   Procedure: TRANSESOPHAGEAL ECHOCARDIOGRAM (TEE);  Surgeon: [name], MD;  Location: [facility];  Service: Cardiovascular;  Laterality: N/A;  . TRANSURETHRAL RESECTION OF PROSTATE  7/12     Medications: Current Meds  Medication Sig  . acetaminophen (TYLENOL) 500 MG tablet Take 1,000 mg by mouth as needed.   Marland Kitchen apixaban (ELIQUIS) 2.5 MG TABS tablet Take 1 tablet (2.5 mg total) by mouth 2 (two) times daily.  . Cholecalciferol (VITAMIN D3) 50 MCG (2000 UT) capsule Take 1 capsule by mouth daily.   Marland Kitchen diltiazem (CARDIZEM CD) 120 MG 24 hr capsule TAKE 1 CAPSULE BY  MOUTH EVERY DAY  . dorzolamide-timolol (COSOPT) 22.3-6.8 MG/ML ophthalmic solution INSTILL 1 DROP INTO BOTH EYES TWICE A DAY  . latanoprost (XALATAN) 0.005 % ophthalmic solution INSTILL 1 DROP INTO BOTH EYES EVERY DAY AT NIGHT     Allergies: Allergies  Allergen Reactions  . Benadryl [Diphenhydramine Hcl] Other (See Comments)    Bad dreams -  Hallucinations.  . Ceftriaxone Sodium Hives  . Contrast Media [Iodinated Diagnostic Agents] Other (See Comments)    Poor kidney function  . Nsaids Other (See Comments)    REACTION: BLEEDING ULCER  . Penicillins Hives  . Levaquin [Levofloxacin]   . Chlorhexidine Rash    Use alcohol for skin prep per patient request.      Social History: The patient  reports that he has never smoked. He has never used smokeless tobacco. He reports that he does not drink alcohol or use drugs.   Family History: The patient's family history includes Kidney Stones in his brother; Liver disease in his sister, sister, and sister; Melanoma in his brother; Polymyositis in his mother; Prostate cancer in his brother and father.   Review of Systems: Please see the history of present illness.   All other systems are reviewed and negative.   Physical Exam: VS:  BP (!) 150/76   Pulse 90   Ht _0  (1.651 m)   Wt 163 lb 1.9 oz (74 kg)   SpO2 97%   BMI 27.14 kg/m  .  BMI Body mass index is 27.14 kg/m.  Wt Readings from Last 3 Encounters:  02/25/20 163 lb 1.9 oz (74 kg)  02/18/20 168 lb (76.2 kg)  11/21/19 163 lb (73.9 kg)   Repeat BP by me is 150/60.  General: Pleasant. Alert and in no acute distress.   Cardiac: Irregular irregular rhythm. Rate is fine. Harsh outflow murmur.  No edema.  Respiratory:  Lungs are clear to auscultation bilaterally with normal work of breathing.  GI: Soft and nontender.  MS: No deformity or atrophy. Gait and ROM intact. Using a cane.  Skin: Warm and dry. Color is normal.  Neuro:  Strength and sensation are intact and no gross focal deficits noted.  Psych: Alert, appropriate and with normal affect.   LABORATORY DATA:  EKG:  EKG is not ordered today.   Lab Results  Component Value Date   WBC 6.6 02/11/2020   HGB 15.9 02/11/2020   HCT 47.0 02/11/2020   PLT 196 02/11/2020   GLUCOSE 108 (H) 02/11/2020   ALT 15 02/11/2020   AST 18 02/11/2020   NA 141 02/11/2020   K 4.1 02/11/2020   CL 109 02/11/2020   CREATININE 1.50 (H) 02/11/2020   BUN 23 02/11/2020   CO2 25 02/11/2020   TSH 4.500 03/29/2017   INR 1.01 02/19/2010   HGBA1C 5.4 12/25/2013       BNP (last 3 results) No results for input(s): BNP in the last 8760 hours.  ProBNP (last 3 results) No results for input(s):  PROBNP in the last 8760 hours.   Other Studies Reviewed Today:  Echo Study Conclusions 10/2017  - Left ventricle: The cavity size was normal. There was mild focal  basal hypertrophy of the septum. Systolic function was normal.  The estimated ejection fraction was in the range of 60% to 65%.  Wall motion was normal; there were no regional wall motion  abnormalities. Features are consistent with a pseudonormal left  ventricular filling pattern, with concomitant abnormal relaxation  and increased filling pressure (grade 2  diastolic dysfunction).  Doppler parameters are consistent with high ventricular filling  pressure.  - Aortic valve: Valve mobility was restricted. There was moderate  stenosis. There was mild to moderate regurgitation. Mean gradient  (S): 20 mm Hg. Peak gradient (S): 33 mm Hg.  - Aortic root: The aortic root was mildly dilated.  - Ascending aorta: The ascending aorta was mildly dilated.  - Mitral valve: Calcified annulus. There was mild regurgitation.    ASSESSMENT & PLAN:    1. Persistent AF - rate controlled - on Eliquis - no problems noted - would continue Diltiazem.   2. Chronic diastolic HF - seems very well compensated.   3. Chronic anticoagulation - on the lower does of Eliquis due to age and creatinine. Recent surveillance labs noted.   4. HTN - he has better control at home - no changes made today.   5. AS/AI - will get his echo updated.   6. HLD - not on therapy - checking lab today.   7. CKD - lab from earlier this month noted.   8. OSA - on CPAP  9. Smoldering Multiple Myeloma/prostate CA - followed closely with Oncology and GU   10. COVID-19 Education: The signs and symptoms of COVID-19 were discussed with the patient and how to seek care for testing (follow up with PCP or arrange E-visit).  The importance of social distancing, staying at home, hand hygiene and wearing a mask when out in public were discussed  today.  Current medicines are reviewed with the patient today.  The patient does not have concerns regarding medicines other than what has been noted above.  The following changes have been made:  See above.  Labs/ tests ordered today include:    Orders Placed This Encounter  Procedures  . Lipid panel  . ECHOCARDIOGRAM COMPLETE     Disposition:   FU with Dr. Tamala Julian in 6 months.    Patient is agreeable to this plan and will call if any problems develop in the interim.   SignedTruitt Merle, NP  02/25/2020 8:32 AM  [facility] [address] [place], [place]  [address] Phone: [phone] Fax: [phone]

## 2020-02-20 ENCOUNTER — Telehealth: Payer: Self-pay | Admitting: Internal Medicine

## 2020-02-20 NOTE — Telephone Encounter (Signed)
Scheduled per los. Called and left msg. Mailed printout  °

## 2020-02-25 ENCOUNTER — Other Ambulatory Visit: Payer: Self-pay

## 2020-02-25 ENCOUNTER — Ambulatory Visit (HOSPITAL_COMMUNITY): Payer: Medicare Other | Attending: Cardiology

## 2020-02-25 ENCOUNTER — Encounter: Payer: Self-pay | Admitting: Nurse Practitioner

## 2020-02-25 ENCOUNTER — Ambulatory Visit: Payer: Medicare Other | Admitting: Nurse Practitioner

## 2020-02-25 VITALS — BP 150/76 | HR 90 | Ht 65.0 in | Wt 163.1 lb

## 2020-02-25 DIAGNOSIS — I350 Nonrheumatic aortic (valve) stenosis: Secondary | ICD-10-CM

## 2020-02-25 DIAGNOSIS — E7800 Pure hypercholesterolemia, unspecified: Secondary | ICD-10-CM

## 2020-02-25 DIAGNOSIS — Z8679 Personal history of other diseases of the circulatory system: Secondary | ICD-10-CM | POA: Diagnosis not present

## 2020-02-25 DIAGNOSIS — I4819 Other persistent atrial fibrillation: Secondary | ICD-10-CM

## 2020-02-25 DIAGNOSIS — Z7901 Long term (current) use of anticoagulants: Secondary | ICD-10-CM

## 2020-02-25 DIAGNOSIS — I5032 Chronic diastolic (congestive) heart failure: Secondary | ICD-10-CM

## 2020-02-25 DIAGNOSIS — I10 Essential (primary) hypertension: Secondary | ICD-10-CM

## 2020-02-25 LAB — LIPID PANEL
Chol/HDL Ratio: 4 ratio (ref 0.0–5.0)
Cholesterol, Total: 164 mg/dL (ref 100–199)
HDL: 41 mg/dL (ref >39)
LDL Chol Calc (NIH): 100 mg/dL — ABNORMAL HIGH (ref 0–99)
Triglycerides: 131 mg/dL (ref 0–149)
VLDL Cholesterol Cal: 23 mg/dL (ref 5–40)

## 2020-02-25 LAB — ECHOCARDIOGRAM COMPLETE
Height: 65 in
Weight: 2609.92 oz

## 2020-02-25 NOTE — Patient Instructions (Addendum)
After Visit Summary:  We will be checking the following labs today - Lipids   Medication Instructions:    Continue with your current medicines.    If you need a refill on your cardiac medications before your next appointment, please call your pharmacy.     Testing/Procedures To Be Arranged:  Echocardiogram  Follow-Up:   See Dr. Tamala Julian in 6 months - You will receive a reminder letter in the mail two months in advance. If you don't receive a letter, please call our office to schedule the follow-up appointment.      At [facility], you and your health needs are our priority.  As part of our continuing mission to provide you with exceptional heart care, we have created designated Provider Care Teams.  These Care Teams include your primary Cardiologist (physician) and Advanced Practice Providers (APPs -  Physician Assistants and Nurse Practitioners) who all work together to provide you with the care you need, when you need it.  Special Instructions:  . Stay safe, stay home, wash your hands for at least 20 seconds and wear a mask when out in public.  . It was good to talk with you today.    Call the [place] office at [phone] if you have any questions, problems or concerns.

## 2020-03-29 ENCOUNTER — Other Ambulatory Visit: Payer: Self-pay | Admitting: Interventional Cardiology

## 2020-03-30 NOTE — Telephone Encounter (Signed)
Eliquis 2.5mg  refill request received. Patient is 85 years old, weight-74kg, Crea-1.50 on 02/11/2020, Diagnosis-Afib, and last seen by Truitt Merle on 02/25/2020. Dose is appropriate based on dosing criteria. Will send in refill to requested pharmacy.

## 2020-05-13 ENCOUNTER — Other Ambulatory Visit: Payer: Self-pay

## 2020-05-13 ENCOUNTER — Inpatient Hospital Stay: Payer: Medicare Other | Attending: Internal Medicine

## 2020-05-13 DIAGNOSIS — E7800 Pure hypercholesterolemia, unspecified: Secondary | ICD-10-CM | POA: Insufficient documentation

## 2020-05-13 DIAGNOSIS — G4733 Obstructive sleep apnea (adult) (pediatric): Secondary | ICD-10-CM | POA: Insufficient documentation

## 2020-05-13 DIAGNOSIS — Z9079 Acquired absence of other genital organ(s): Secondary | ICD-10-CM | POA: Diagnosis not present

## 2020-05-13 DIAGNOSIS — Z79899 Other long term (current) drug therapy: Secondary | ICD-10-CM | POA: Diagnosis not present

## 2020-05-13 DIAGNOSIS — Z880 Allergy status to penicillin: Secondary | ICD-10-CM | POA: Diagnosis not present

## 2020-05-13 DIAGNOSIS — Z888 Allergy status to other drugs, medicaments and biological substances status: Secondary | ICD-10-CM | POA: Diagnosis not present

## 2020-05-13 DIAGNOSIS — I5032 Chronic diastolic (congestive) heart failure: Secondary | ICD-10-CM | POA: Insufficient documentation

## 2020-05-13 DIAGNOSIS — Z886 Allergy status to analgesic agent status: Secondary | ICD-10-CM | POA: Diagnosis not present

## 2020-05-13 DIAGNOSIS — G629 Polyneuropathy, unspecified: Secondary | ICD-10-CM | POA: Diagnosis not present

## 2020-05-13 DIAGNOSIS — M1711 Unilateral primary osteoarthritis, right knee: Secondary | ICD-10-CM | POA: Insufficient documentation

## 2020-05-13 DIAGNOSIS — Z9049 Acquired absence of other specified parts of digestive tract: Secondary | ICD-10-CM | POA: Insufficient documentation

## 2020-05-13 DIAGNOSIS — I130 Hypertensive heart and chronic kidney disease with heart failure and stage 1 through stage 4 chronic kidney disease, or unspecified chronic kidney disease: Secondary | ICD-10-CM | POA: Insufficient documentation

## 2020-05-13 DIAGNOSIS — Z7901 Long term (current) use of anticoagulants: Secondary | ICD-10-CM | POA: Insufficient documentation

## 2020-05-13 DIAGNOSIS — M2550 Pain in unspecified joint: Secondary | ICD-10-CM | POA: Insufficient documentation

## 2020-05-13 DIAGNOSIS — Z881 Allergy status to other antibiotic agents status: Secondary | ICD-10-CM | POA: Diagnosis not present

## 2020-05-13 DIAGNOSIS — N189 Chronic kidney disease, unspecified: Secondary | ICD-10-CM | POA: Insufficient documentation

## 2020-05-13 DIAGNOSIS — Z8546 Personal history of malignant neoplasm of prostate: Secondary | ICD-10-CM | POA: Diagnosis not present

## 2020-05-13 DIAGNOSIS — C9000 Multiple myeloma not having achieved remission: Secondary | ICD-10-CM | POA: Diagnosis not present

## 2020-05-13 DIAGNOSIS — M879 Osteonecrosis, unspecified: Secondary | ICD-10-CM | POA: Diagnosis not present

## 2020-05-13 LAB — CMP (CANCER CENTER ONLY)
ALT: 12 U/L (ref 0–44)
AST: 17 U/L (ref 15–41)
Albumin: 3.9 g/dL (ref 3.5–5.0)
Alkaline Phosphatase: 124 U/L (ref 38–126)
Anion gap: 7 (ref 5–15)
BUN: 22 mg/dL (ref 8–23)
CO2: 25 mmol/L (ref 22–32)
Calcium: 10.6 mg/dL — ABNORMAL HIGH (ref 8.9–10.3)
Chloride: 109 mmol/L (ref 98–111)
Creatinine: 1.47 mg/dL — ABNORMAL HIGH (ref 0.61–1.24)
GFR, Est AFR Am: 50 mL/min — ABNORMAL LOW (ref >60)
GFR, Estimated: 43 mL/min — ABNORMAL LOW (ref >60)
Glucose, Bld: 99 mg/dL (ref 70–99)
Potassium: 4.3 mmol/L (ref 3.5–5.1)
Sodium: 141 mmol/L (ref 135–145)
Total Bilirubin: 0.7 mg/dL (ref 0.3–1.2)
Total Protein: 6.8 g/dL (ref 6.5–8.1)

## 2020-05-13 LAB — CBC WITH DIFFERENTIAL (CANCER CENTER ONLY)
Abs Immature Granulocytes: 0.06 10*3/uL (ref 0.00–0.07)
Basophils Absolute: 0 10*3/uL (ref 0.0–0.1)
Basophils Relative: 0 %
Eosinophils Absolute: 0.2 10*3/uL (ref 0.0–0.5)
Eosinophils Relative: 3 %
HCT: 49.6 % (ref 39.0–52.0)
Hemoglobin: 16.6 g/dL (ref 13.0–17.0)
Immature Granulocytes: 1 %
Lymphocytes Relative: 30 %
Lymphs Abs: 2.1 10*3/uL (ref 0.7–4.0)
MCH: 31.7 pg (ref 26.0–34.0)
MCHC: 33.5 g/dL (ref 30.0–36.0)
MCV: 94.8 fL (ref 80.0–100.0)
Monocytes Absolute: 0.7 10*3/uL (ref 0.1–1.0)
Monocytes Relative: 10 %
Neutro Abs: 3.9 10*3/uL (ref 1.7–7.7)
Neutrophils Relative %: 56 %
Platelet Count: 198 10*3/uL (ref 150–400)
RBC: 5.23 MIL/uL (ref 4.22–5.81)
RDW: 13.9 % (ref 11.5–15.5)
WBC Count: 6.9 10*3/uL (ref 4.0–10.5)
nRBC: 0 % (ref 0.0–0.2)

## 2020-05-13 LAB — LACTATE DEHYDROGENASE: LDH: 183 U/L (ref 98–192)

## 2020-05-14 ENCOUNTER — Telehealth: Payer: Self-pay | Admitting: Cardiovascular Disease

## 2020-05-14 LAB — KAPPA/LAMBDA LIGHT CHAINS
Kappa free light chain: 46.4 mg/L — ABNORMAL HIGH (ref 3.3–19.4)
Kappa, lambda light chain ratio: 2.64 — ABNORMAL HIGH (ref 0.26–1.65)
Lambda free light chains: 17.6 mg/L (ref 5.7–26.3)

## 2020-05-14 LAB — BETA 2 MICROGLOBULIN, SERUM: Beta-2 Microglobulin: 3.1 mg/L — ABNORMAL HIGH (ref 0.6–2.4)

## 2020-05-14 LAB — IGG, IGA, IGM
IgA: 70 mg/dL (ref 61–437)
IgG (Immunoglobin G), Serum: 837 mg/dL (ref 603–1613)
IgM (Immunoglobulin M), Srm: 32 mg/dL (ref 15–143)

## 2020-05-14 NOTE — Telephone Encounter (Signed)
Called and spoke with pt, pt asking to schedule a follow up appt with Dr.Kelly. able to schedule pt for 08/24/20 at 11:20am. Pt verbalized understanding with no other questions at this time.

## 2020-05-14 NOTE — Telephone Encounter (Signed)
Patient is requesting to schedule a sleep study with Dr. Claiborne Billings. Please assist.

## 2020-05-20 ENCOUNTER — Encounter: Payer: Self-pay | Admitting: Internal Medicine

## 2020-05-20 ENCOUNTER — Other Ambulatory Visit: Payer: Self-pay

## 2020-05-20 ENCOUNTER — Inpatient Hospital Stay: Payer: Medicare Other | Admitting: Internal Medicine

## 2020-05-20 ENCOUNTER — Telehealth: Payer: Self-pay | Admitting: Internal Medicine

## 2020-05-20 VITALS — BP 138/77 | HR 78 | Temp 98.5°F | Resp 18 | Ht 65.0 in | Wt 169.0 lb

## 2020-05-20 DIAGNOSIS — I10 Essential (primary) hypertension: Secondary | ICD-10-CM

## 2020-05-20 DIAGNOSIS — C9000 Multiple myeloma not having achieved remission: Secondary | ICD-10-CM

## 2020-05-20 NOTE — Progress Notes (Signed)
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 2) History of pheochromocytoma LEFT adrenal gland status post resection on 03/16/2009 3) Prostate adenocarcinoma  PRIOR THERAPY: Systemic therapy with weekly Velcade 1.3 mg/KG subcutaneously, Revlimid 25 mg p.o. daily for 21 days every 4 weeks as well as Decadron 20 mg p.o. weekly.  First dose November 07, 2017.  Status post 7 cycles.  CURRENT THERAPY: Observation.  INTERVAL HISTORY: Thomas [name] 85 y.o. male returns to the clinic today for 3 months follow-up visit.  The patient is feeling fine today with no concerning complaints except for arthritis of the right knee.  He denied having any chest pain, shortness of breath, cough or hemoptysis.  He denied having any fever or chills.  He has no nausea, vomiting, diarrhea or constipation.  He has no headache or visual changes.  The patient is currently on observation.  He had repeat myeloma panel performed recently and he is here for evaluation and discussion of his lab results and recommendation regarding treatment of his condition.  MEDICAL HISTORY: Past Medical History:  Diagnosis Date  . BPH (benign prostatic hypertrophy)   . Chronic diastolic heart failure ([facility]) 12/27/2013  . Chronic kidney disease    Hx of bilateral renal cysts.  . Encounter for antineoplastic chemotherapy 02/20/2018  . Glaucoma   . Heart murmur   . History of atrial fibrillation 03/09/09  . Hx of epiglottitis    2011  . Hypercholesterolemia   . Hypertension   . Long term current use of amiodarone 11/07/2013   Amiodarone   . Mild aortic stenosis   . OSA on CPAP 06/26/2014  . Paroxysmal atrial fibrillation ([facility]) 11/07/2013  . Prostate cancer ([facility]) 7/12   T1C Ad Ca   s/p TURP, observation only  . Renal insufficiency 09/06/2011  . Sinus node dysfunction ([facility]) 08/14/2014  . Sleep apnea   . Smoldering multiple myeloma ([facility])     ALLERGIES:  is allergic to benadryl [diphenhydramine hcl], ceftriaxone sodium, contrast media [iodinated diagnostic agents], nsaids, penicillins, levaquin [levofloxacin], and chlorhexidine.  MEDICATIONS:  Current Outpatient Medications  Medication Sig Dispense Refill  . acetaminophen (TYLENOL) 500 MG tablet Take 1,000 mg by mouth as needed.     . Cholecalciferol (VITAMIN D3) 50 MCG (2000 UT) capsule Take 1 capsule by mouth daily.     Marland Kitchen diltiazem (CARDIZEM CD) 120 MG 24 hr capsule TAKE 1 CAPSULE BY MOUTH EVERY DAY 90 capsule 2  . dorzolamide-timolol (COSOPT) 22.3-6.8 MG/ML ophthalmic solution INSTILL 1 DROP INTO BOTH EYES TWICE A DAY    . ELIQUIS 2.5 MG TABS tablet TAKE 1 TABLET (2.5 MG TOTAL) BY MOUTH 2 (TWO) TIMES DAILY. 180 tablet 2  . latanoprost (XALATAN) 0.005 % ophthalmic solution INSTILL 1 DROP INTO BOTH EYES EVERY DAY AT NIGHT  6   No current facility-administered medications for this visit.    SURGICAL HISTORY:  Past Surgical History:  Procedure Laterality Date  . ADRENALECTOMY  02/12/11   Left, for Pheochromocytoma  . CARDIOVERSION N/A 12/26/2013   Procedure: CARDIOVERSION;  Surgeon: [name], MD;  Location: [facility];  Service: Cardiovascular;  Laterality: N/A;  . CHOLECYSTECTOMY  02/12/11  . GASTRECTOMY  1984   S/P gastrectomy for ulcer  . KNEE SURGERY  1971   S/P Right knee surgery  . TEE WITHOUT CARDIOVERSION N/A 12/26/2013   Procedure: TRANSESOPHAGEAL ECHOCARDIOGRAM (TEE);  Surgeon: [name], MD;  Location: [facility];  Service: Cardiovascular;  Laterality: N/A;  . TRANSURETHRAL RESECTION OF PROSTATE  7/12    REVIEW OF SYSTEMS:  A comprehensive review of systems was negative except for: Musculoskeletal: positive for arthralgias   PHYSICAL EXAMINATION: General appearance: alert, cooperative and  no distress Head: Normocephalic, without obvious abnormality, atraumatic Neck: no adenopathy, no JVD, supple, symmetrical, trachea midline and thyroid not enlarged, symmetric, no tenderness/mass/nodules Lymph nodes: Cervical, supraclavicular, and axillary nodes normal. Resp: clear to auscultation bilaterally Back: symmetric, no curvature. ROM normal. No CVA tenderness. Cardio: regular rate and rhythm, S1, S2 normal, no murmur, click, rub or gallop GI: soft, non-tender; bowel sounds normal; no masses,  no organomegaly Extremities: extremities normal, atraumatic, no cyanosis or edema  ECOG PERFORMANCE STATUS: 1 - Symptomatic but completely ambulatory  Blood pressure 138/77, pulse 78, temperature 98.5 F (36.9 C), temperature source Tympanic, resp. rate 18, height 5' 5"  (1.651 m), weight 169 lb (76.7 kg), SpO2 100 %.  LABORATORY DATA: Lab Results  Component Value Date   WBC 6.9 05/13/2020   HGB 16.6 05/13/2020   HCT 49.6 05/13/2020   MCV 94.8 05/13/2020   PLT 198 05/13/2020      Chemistry      Component Value Date/Time   NA 141 05/13/2020 1005   NA 138 08/16/2017 1404   K 4.3 05/13/2020 1005   K 4.5 08/16/2017 1404   CL 109 05/13/2020 1005   CL 109 (H) 03/21/2013 0958   CO2 25 05/13/2020 1005   CO2 24 08/16/2017 1404   BUN 22 05/13/2020 1005   BUN 17.3 08/16/2017 1404   CREATININE 1.47 (H) 05/13/2020 1005   CREATININE 1.8 (H) 08/16/2017 1404      Component Value Date/Time   CALCIUM 10.6 (H) 05/13/2020 1005   CALCIUM 9.9 08/16/2017 1404   ALKPHOS 124 05/13/2020 1005   ALKPHOS 86 08/16/2017 1404   AST 17 05/13/2020 1005   AST 22 08/16/2017 1404   ALT 12 05/13/2020 1005   ALT 16 08/16/2017 1404   BILITOT 0.7 05/13/2020 1005   BILITOT 0.78 08/16/2017 1404       RADIOGRAPHIC STUDIES: No results found.  ASSESSMENT AND PLAN:  This is a very pleasant 85 years old white male with IgG smoldering Loma diagnosed in April 2011. The patient has been on observation since  2011. His recent myeloma panel showed further increase in the free kappa light chain. His recent skeletal bone survey showed no concerning findings for disease progression.  He has a focus of avascular necrosis but no significant lytic lesions. His bone marrow bone marrow biopsy and aspirate showed further increase in the plasma cells to 15%. The patient underwent treatment with subcutaneous weekly Velcade as well as Revlimid and Decadron status post 7 cycles. He tolerated the previous treatment well except for mild peripheral neuropathy. The patient is currently on observation and he is feeling fine today with no concerning complaints. Repeat myeloma panel showed further increase in the free kappa light chain but the patient has no other abnormalities specially worsening renal function or anemia. I recommended for the patient to continue on observation with repeat myeloma panel in 4 months. He was advised to call immediately if he has any concerning symptoms in the interval. The patient voices understanding of current disease status and treatment options and is in agreement with  the current care plan. All questions were answered. The patient knows to call the clinic with any problems, questions or concerns. We can certainly see the patient much sooner if necessary.  Disclaimer: This note was dictated with voice recognition software. Similar sounding words can inadvertently be transcribed and may not be corrected upon review.

## 2020-05-20 NOTE — Telephone Encounter (Signed)
Scheduled per 8/17 los. Printed avs and calendar for pt. 

## 2020-07-20 ENCOUNTER — Other Ambulatory Visit: Payer: Self-pay | Admitting: Interventional Cardiology

## 2020-07-23 ENCOUNTER — Other Ambulatory Visit: Payer: Self-pay | Admitting: Cardiovascular Disease

## 2020-07-23 NOTE — Telephone Encounter (Signed)
*  STAT* If patient is at the pharmacy, call can be transferred to refill team.   1. Which medications need to be refilled? (please list name of each medication and dose if known) diltiazem (CARTIA XT) 120 MG 24 hr capsule ELIQUIS 2.5 MG TABS tablet 2. Which pharmacy/location (including street and city if local pharmacy) is medication to be sent to? [facility] - [place], [place] - [place] [address]  3. Do they need a 30 day or 90 day supply? 90 day supply

## 2020-07-27 MED ORDER — APIXABAN 2.5 MG PO TABS
ORAL_TABLET | ORAL | 0 refills | Status: DC
Start: 2020-07-27 — End: 2020-10-08

## 2020-07-27 NOTE — Telephone Encounter (Signed)
Prescription refill request for Eliquis received.  Last office visit: Servando Snare 02/25/2020 Scr: 1.47, 05/13/2020 Age:  85 yo Weight: 76.7 kg   Per dosing criteria pt qualifies for a dose increase, however, past Scr have been greater then 1.5,  Spoke with Megan pharm D. Will send in refill for Eliquis 2.5mg  BID. Pt sees Dr. Tamala Julian in December of 2021. Will put on appointment note for pt get CBC and Bmet to follow Scr trends.   Prescription refill sent.

## 2020-07-27 NOTE — Telephone Encounter (Signed)
Sees dr. Tamala Julian now

## 2020-08-04 ENCOUNTER — Other Ambulatory Visit: Payer: Self-pay | Admitting: Family Medicine

## 2020-08-04 DIAGNOSIS — M8580 Other specified disorders of bone density and structure, unspecified site: Secondary | ICD-10-CM

## 2020-08-24 ENCOUNTER — Ambulatory Visit: Payer: Medicare Other | Admitting: Cardiovascular Disease

## 2020-08-24 ENCOUNTER — Encounter: Payer: Self-pay | Admitting: Cardiovascular Disease

## 2020-08-24 ENCOUNTER — Other Ambulatory Visit: Payer: Self-pay

## 2020-08-24 VITALS — BP 140/74 | HR 63 | Ht 65.5 in | Wt 173.2 lb

## 2020-08-24 DIAGNOSIS — G4733 Obstructive sleep apnea (adult) (pediatric): Secondary | ICD-10-CM | POA: Diagnosis not present

## 2020-08-24 DIAGNOSIS — I4819 Other persistent atrial fibrillation: Secondary | ICD-10-CM | POA: Diagnosis not present

## 2020-08-24 DIAGNOSIS — Z9989 Dependence on other enabling machines and devices: Secondary | ICD-10-CM | POA: Diagnosis not present

## 2020-08-24 DIAGNOSIS — Z7901 Long term (current) use of anticoagulants: Secondary | ICD-10-CM | POA: Diagnosis not present

## 2020-08-24 DIAGNOSIS — C9000 Multiple myeloma not having achieved remission: Secondary | ICD-10-CM

## 2020-08-24 DIAGNOSIS — N1832 Chronic kidney disease, stage 3b: Secondary | ICD-10-CM

## 2020-08-24 DIAGNOSIS — I5032 Chronic diastolic (congestive) heart failure: Secondary | ICD-10-CM

## 2020-08-24 DIAGNOSIS — I350 Nonrheumatic aortic (valve) stenosis: Secondary | ICD-10-CM

## 2020-08-24 NOTE — Progress Notes (Signed)
Patient ID: Thomas [name], male   DOB: [birth year], 84 y.o.   MRN: [id number]     HPI: Thomas [name], is a 85 y.o. male who is followed by Dr. Tamala Julian for cardiology care.  I initially saw him  in September 2015 for sleep clinic evaluation following initiation of CPAP therapy.  I last saw him in November 2020.  He presents for 1 year follow-up sleep evaluation.  Thomas [name] has a history of paroxysmal atrial fibrillation and  has been maintaining sinus rhythm with sinus bradycardia.  He has a history of hypertension, hyperlipidemia, documented bilateral renal cysts, and mild aortic valve stenosis, and multiple myeloma.  Due to concerns for obstructive sleep apnea, particularly with his atrial fibrillation, history he was referred by Dr. Tamala Julian for a diagnostic polysomnogram in 2015.   His diagnostic polysomnogram was done at the [facility] on 02/05/2014 revealed severe obstructive sleep apnea with an AHI of 45.7 per hour.  He had oxygen desaturation to 86% and there was evidence for moderate snoring.  There were frequent periodic limb movements with an index of 36 per hour with 20.3 per hour to arousal.  He subsequently was referred for a CPAP titration in a CPAP pressure of 9 cm was recommended.  Of note, there was significant improvement in his periodic leg movements with CPAP therapy.  A download from 05/24/2014 to 06/22/2014 showed excellent compliance with 100% days of usage.  He had 97% days with use greater than 4 hours and was meeting Medicare compliance standards.  He has an AirSense 10 AutoSet unit and is at a set pressure of 9 cm.  His AHI is now excellent at 1.2.  There is no leak.  He has a Special educational needs teacher, fullface mask (medium).  When I saw him in February 2018 he was 100% compliant with CPAP.  Download  from 10/23/2016 through 11/21/2016, which confirmed 100% compliance.  CPAP was set at 9 cm set pressure; AHI was 1.0.  He was using a full facemask.  His nocturia  had significantly improved with CPAP treatment. His Epworth Sleepiness Scale score is improved and was 6 compared to previously being elevated  Epworth Sleepiness Scale: Situation   Chance of Dozing/Sleeping (0 = never , 1 = slight chance , 2 = moderate chance , 3 = high chance )   sitting and reading 2   watching TV 1   sitting inactive in a public place 0   being a passenger in a motor vehicle for an hour or more 0   lying down in the afternoon 3   sitting and talking to someone 0   sitting quietly after lunch (no alcohol) 0   while stopped for a few minutes in traffic as the driver 0   Total Score  6   He continues to be followed by Dr. Tamala Julian for his paroxysmal atrial fibrillation, chronic diastolic heart failure, hypertension, chronic kidney disease and mild aortic stenosis.  Remotely he was not on anticoagulation secondary to  recurrent GU bleeding, but currently is on low-dose Eliquis.  He has undergone chemotherapy for multiple myeloma as well as treatment for prostate CA.  When I  saw him in October 2019 he had  lost 49 pounds with his recent illnesses.  His primary physician question whether or not he still needed to use CPAP therapy.  In the office today I calculated a new Epworth Sleepiness Scale score and this endorsed at 11 and was consistent with  mild daytime sleepiness.  He has a ResMed air sense 10 AutoSet unit and has been set at a 9 cm pressure.  He was using an old air fit F10 full facemask and has had difficulty with pressure on the bridge of his nose.  A download from June 25, 2018 through July 24, 2018 shows 100% compliance;  averaging 6 hours and 26 minutes of CPAP use.  AHI is 3.1.  There was no leak.  During that evaluation I recommended he switch to the DreamWear technology full facemask in light of his difficulty with the older facemask on the bridge of his nose.  We discussed the importance of increasing sleep duration since his CPAP use was just under 6 hours and  30 minutes.  I last saw him in November 2020 and he continued to be in  permanent atrial fibrillation and was on diltiazem CD 120 mg and Eliquis 2.5 mg twice a day.  He last saw Dr. Tamala Julian in February 2020.  A download was obtained today from July 13, 2019 through August 11, 2019.  Usage days 87%.  He has not been able to sleep for long duration since his wife had fallen and had broken 4 vertebrae and has had significant difficulty resulting from this.  As result he has been caring for her and oftentimes gets up during the night.  On his  download average use was 5 hours per night.  At his 9 cm water pressure, AHI however continued to be excellent at 1.2.  He typically goes to bed at 11:30 and wakes up at 630.  He likes the current air fit F 30 medium size mask that he is using that has a Pharmacologist compared to his old full facemask.  However, at times the tubing which from the front sometimes gets in the way when he tries to turn from side to side.  During that evaluation I recommended he switch to the ResMed air fit F 30i mask where the tubing arises from the crown of the head and does not interfere with him moving side to side.  Over the past year, he has been stable.  Previously he had significant weight loss as result of his illnesses.  But over the past year has had weight gain from 154 up to 173 pounds.  He admits to continuing to feel well with CPAP therapy.  He is unaware of breakthrough snoring.  A download was obtained from October 21 through August 21, 2020.  He is meeting compliance standards.  Average use is 6 hours and 11 minutes.  At a 9 cm set pressure, AHI is 4.2.  There is no mask leak.  An Epworth Sleepiness Scale score was recalculated in the office today and this endorsed at 5 arguing against residual daytime sleepiness.  He presents for yearly evaluation  Past Medical History:  Diagnosis Date  . BPH (benign prostatic hypertrophy)   . Chronic diastolic heart  failure ([place]) 12/27/2013  . Chronic kidney disease    Hx of bilateral renal cysts.  . Encounter for antineoplastic chemotherapy 02/20/2018  . Glaucoma   . Heart murmur   . History of atrial fibrillation 03/09/09  . Hx of epiglottitis    2011  . Hypercholesterolemia   . Hypertension   . Long term current use of amiodarone 11/07/2013   Amiodarone   . Mild aortic stenosis   . OSA on CPAP 06/26/2014  . Paroxysmal atrial fibrillation ([place]) 11/07/2013  . Prostate cancer (  [place]) 7/12   T1C Ad Ca  s/p TURP, observation only  . Renal insufficiency 09/06/2011  . Sinus node dysfunction ([place]) 08/14/2014  . Sleep apnea   . Smoldering multiple myeloma ([place])     Past Surgical History:  Procedure Laterality Date  . ADRENALECTOMY  02/12/11   Left, for Pheochromocytoma  . CARDIOVERSION N/A 12/26/2013   Procedure: CARDIOVERSION;  Surgeon: [name], MD;  Location: [facility];  Service: Cardiovascular;  Laterality: N/A;  . CHOLECYSTECTOMY  02/12/11  . GASTRECTOMY  1984   S/P gastrectomy for ulcer  . KNEE SURGERY  1971   S/P Right knee surgery  . TEE WITHOUT CARDIOVERSION N/A 12/26/2013   Procedure: TRANSESOPHAGEAL ECHOCARDIOGRAM (TEE);  Surgeon: [name], MD;  Location: [facility];  Service: Cardiovascular;  Laterality: N/A;  . TRANSURETHRAL RESECTION OF PROSTATE  7/12    Allergies  Allergen Reactions  . Benadryl [Diphenhydramine Hcl] Other (See Comments)    Bad dreams -  Hallucinations.  . Ceftriaxone Sodium Hives  . Contrast Media [Iodinated Diagnostic Agents] Other (See Comments)    Poor kidney function  . Nsaids Other (See Comments)    REACTION: BLEEDING ULCER  . Penicillins Hives  . Levaquin [Levofloxacin]   . Oxybutynin Other (See Comments)    Lightheaded, dizzy, unbalanced. "Disturbed my equilibrium."  . Chlorhexidine Rash    Use alcohol for skin prep per patient request.     Current Outpatient Medications  Medication Sig Dispense Refill  . acetaminophen (TYLENOL) 500 MG  tablet Take 1,000 mg by mouth as needed.     Marland Kitchen apixaban (ELIQUIS) 2.5 MG TABS tablet TAKE 1 TABLET (2.5 MG TOTAL) BY MOUTH 2 (TWO) TIMES DAILY. 180 tablet 0  . Cholecalciferol (VITAMIN D3) 50 MCG (2000 UT) capsule Take 1 capsule by mouth daily.     Marland Kitchen diltiazem (CARTIA XT) 120 MG 24 hr capsule TAKE ONE CAPSULE BY MOUTH ONCE DAILY 90 capsule 2  . dorzolamide-timolol (COSOPT) 22.3-6.8 MG/ML ophthalmic solution INSTILL 1 DROP INTO BOTH EYES TWICE A DAY    . latanoprost (XALATAN) 0.005 % ophthalmic solution INSTILL 1 DROP INTO BOTH EYES EVERY DAY AT NIGHT  6   No current facility-administered medications for this visit.    Social History   Socioeconomic History  . Marital status: Married    Spouse name: Not on file  . Number of children: 3  . Years of education: Not on file  . Highest education level: Not on file  Occupational History    Employer: RETIRED  Tobacco Use  . Smoking status: Never Smoker  . Smokeless tobacco: Never Used  Vaping Use  . Vaping Use: Never used  Substance and Sexual Activity  . Alcohol use: No  . Drug use: No  . Sexual activity: Not Currently  Other Topics Concern  . Not on file  Social History Narrative  . Not on file   Social Determinants of Health   Financial Resource Strain:   . Difficulty of Paying Living Expenses: Not on file  Food Insecurity:   . Worried About Charity fundraiser in the Last Year: Not on file  . Ran Out of Food in the Last Year: Not on file  Transportation Needs:   . Lack of Transportation (Medical): Not on file  . Lack of Transportation (Non-Medical): Not on file  Physical Activity:   . Days of Exercise per Week: Not on file  . Minutes of Exercise per Session: Not on file  Stress:   .  Feeling of Stress : Not on file  Social Connections:   . Frequency of Communication with Friends and Family: Not on file  . Frequency of Social Gatherings with Friends and Family: Not on file  . Attends Religious Services: Not on file  .  Active Member of Clubs or Organizations: Not on file  . Attends Archivist Meetings: Not on file  . Marital Status: Not on file  Intimate Partner Violence:   . Fear of Current or Ex-Partner: Not on file  . Emotionally Abused: Not on file  . Physically Abused: Not on file  . Sexually Abused: Not on file     ROS General: Negative; No fevers, chills, or night sweats HEENT: Negative; No changes in vision or hearing, sinus congestion, difficulty swallowing Pulmonary: Negative; No cough, wheezing, shortness of breath, hemoptysis Cardiovascular: Positive for permanent atrial fibrillation no chest pain, presyncope, syncope, palpatations GI: Negative; No nausea, vomiting, diarrhea, or abdominal pain GU: Negative; No dysuria, hematuria, or difficulty voiding Musculoskeletal: Negative; no myalgias, joint pain, or weakness Hematologic: Positive for multiple myeloma; and prostate CA Endocrine: Negative; no heat/cold intolerance Neuro: Negative; no changes in balance, headaches Skin: Negative; No rashes or skin lesions Psychiatric: Negative; No behavioral problems, depression Sleep: Positive for OSA on CPAP; No daytime sleepiness, hypersomnolence, bruxism, restless legs, hypnogognic hallucinations, no cataplexy   Physical Exam BP 140/74   Pulse 63   Ht 5' 5.5" (1.664 m)   Wt 173 lb 3.2 oz (78.6 kg)   BMI 28.38 kg/m    Repeat blood pressure by me was 124/72  Wt Readings from Last 3 Encounters:  08/24/20 173 lb 3.2 oz (78.6 kg)  05/20/20 169 lb (76.7 kg)  02/25/20 163 lb 1.9 oz (74 kg)    General: Alert, oriented, no distress.  Skin: normal turgor, no rashes, warm and dry HEENT: Normocephalic, atraumatic. Pupils equal round and reactive to light; sclera anicteric; extraocular muscles intact;  Nose without nasal septal hypertrophy Mouth/Parynx benign; Mallinpatti scale 3 Neck: No JVD, no carotid bruits; normal carotid upstroke Lungs: clear to ausculatation and percussion; no  wheezing or rales Chest wall: without tenderness to palpitation Heart: PMI not displaced, irregular irregular consistent with his permanent atrial fibrillation, controlled rate, s1 s2 normal, 1/6 systolic murmur, no diastolic murmur, no rubs, gallops, thrills, or heaves Abdomen: soft, nontender; no hepatosplenomehaly, BS+; abdominal aorta nontender and not dilated by palpation. Back: no CVA tenderness Pulses 2+ Musculoskeletal: full range of motion, normal strength, no joint deformities Extremities: no clubbing cyanosis or edema, Homan's sign negative  Neurologic: grossly nonfocal; Cranial nerves grossly wnl Psychologic: Normal mood and affect   ECG (independently read by me): Atrial fibrillation at 63; QTc 417 msec    November 2020 ECG (independently read by me): Atrial fibrillation at 78; QTc 414 msec  October 2019 ECG (independently read by me): Atrial fibrillation at 84 bpm.  QTc interval 460 ms.  February 2018 ECG (independently read by me): Sinus bradycardia at 59 bpm with first-degree AV block (PR interval 21 6/22).  QTc interval 451 ms.  LABS:  BMP Latest Ref Rng & Units 05/13/2020 02/11/2020 11/14/2019  Glucose 70 - 99 mg/dL 99 108(H) 132(H)  BUN 8 - 23 mg/dL 22 23 26(H)  Creatinine 0.61 - 1.24 mg/dL 1.47(H) 1.50(H) 1.53(H)  Sodium 135 - 145 mmol/L 141 141 144  Potassium 3.5 - 5.1 mmol/L 4.3 4.1 4.1  Chloride 98 - 111 mmol/L 109 109 110  CO2 22 - 32 mmol/L 25 25 25  Calcium 8.9 - 10.3 mg/dL 10.6(H) 10.0 9.6   Hepatic Function Latest Ref Rng & Units 05/13/2020 02/11/2020 11/14/2019  Total Protein 6.5 - 8.1 g/dL 6.8 6.3(L) 6.2(L)  Albumin 3.5 - 5.0 g/dL 3.9 3.8 3.8  AST 15 - 41 U/L 17 18 17   ALT 0 - 44 U/L 12 15 16   Alk Phosphatase 38 - 126 U/L 124 115 105  Total Bilirubin 0.3 - 1.2 mg/dL 0.7 0.7 0.8  Bilirubin, Direct <=0.2 mg/dL - - -   CBC Latest Ref Rng & Units 05/13/2020 02/11/2020 11/14/2019  WBC 4.0 - 10.5 K/uL 6.9 6.6 6.4  Hemoglobin 13.0 - 17.0 g/dL 16.6 15.9 16.1    Hematocrit 39 - 52 % 49.6 47.0 47.6  Platelets 150 - 400 K/uL 198 196 172   Lab Results  Component Value Date   TSH 4.500 03/29/2017   Lab Results  Component Value Date   HGBA1C 5.4 12/25/2013   Lipid Panel     Component Value Date/Time   CHOL 164 02/25/2020 0842   TRIG 131 02/25/2020 0842   HDL 41 02/25/2020 0842   CHOLHDL 4.0 02/25/2020 0842   LDLCALC 100 (H) 02/25/2020 4259     RADIOLOGY: No results found.  IMPRESSION:  1. OSA on CPAP   2. Persistent atrial fibrillation ([place])   3. Chronic diastolic heart failure ([place])   4. Chronic anticoagulation   5. Mild aortic stenosis   6. Multiple myeloma not having achieved remission ([facility])   7. Stage 3b chronic kidney disease ([facility])     ASSESSMENT AND PLAN: Thomas [name] Is an 85 year old male who has a history of permanent atrial fibrillation, previous documentation of chronic diastolic heart failure, hypertension, chronic kidney disease, mild aortic stenosis as well as a history of multiple myeloma, prostate adenocarcinoma and a history of pheochromocytoma status post left adrenal gland resection.  He has been documented to have severe obstructive sleep apnea with an AHI of 45.7 and has been on CPAP therapy since 03/26/2014.   He had significant periodic leg movements on the baseline study, but these significantly improved with CPAP initiation.  He has had issues with his left knee and often moves his legs for comfort but denies any painful restless legs.  Over the years he had lost significant weight from 196 pounds to  148.  Subsequently he has had weight gain and today weight is 173.  His atrial fibrillation rate is controlled on diltiazem 120 mg daily.  He continues to take Eliquis but is on a low dose due to prior bleed issues.  He has stage III chronic kidney disease and his most recent creatinine in October 2021 was 1.40 which was improved from 1.47 on May 20, 2020.  He has continued to use CPAP and notes significant  improvement in his sleep since initiation.  A download was obtained today and reviewed with him in detail.  AHI is 4.2 at a 9 cm set pressure.  His average usage is 6 hours and 11 minutes.  I discussed more optimal sleep duration at 7 to 8 hours preferably.  With his weight gain and AHI at 4.2, I have suggested changing him from a set 9 cm water pressure to an auto mode with a range of 8 to 14 cm.  He has the ResMed air fit F 30i mask which she likes well particularly with the tubing arising from the crown of his head which allows for no restriction to turning motion.  He typically spends most of  the night sleeping left to right but occasionally does sleep supine.  He is unaware of breakthrough snoring.  He has nocturia approximately 1-3 times per night.  His sleep is restorative.  He has follow-up appointment in December scheduled with Dr. Earlie Server who is following his multiple myeloma and Dr. Tamala Julian.  I will see him in 1 year for follow-up sleep evaluation.   Troy Sine, MD, [facility]  08/26/2020 9:11 AM

## 2020-08-24 NOTE — Patient Instructions (Signed)
Medication Instructions:   No changes   Other Instructions   Changed setting on  Sleep device C-PAP to Autoset   min 8  To max 16 The information was sent to your device   Lab Work:  notneeded   Testing/Procedures: Not needed   Follow-Up: At [facility], you and your health needs are our priority.  As part of our continuing mission to provide you with exceptional heart care, we have created designated Provider Care Teams.  These Care Teams include your primary Cardiologist (physician) and Advanced Practice Providers (APPs -  Physician Assistants and Nurse Practitioners) who all work together to provide you with the care you need, when you need it.     Your next appointment:   12 month(s) sleep clinic  The format for your next appointment:   In Person  Provider:   Shelva Majestic, MD

## 2020-08-26 ENCOUNTER — Encounter: Payer: Self-pay | Admitting: Cardiovascular Disease

## 2020-09-10 ENCOUNTER — Other Ambulatory Visit: Payer: Self-pay

## 2020-09-10 ENCOUNTER — Inpatient Hospital Stay: Payer: Medicare Other | Attending: Internal Medicine

## 2020-09-10 DIAGNOSIS — Z881 Allergy status to other antibiotic agents status: Secondary | ICD-10-CM | POA: Diagnosis not present

## 2020-09-10 DIAGNOSIS — N1832 Chronic kidney disease, stage 3b: Secondary | ICD-10-CM | POA: Insufficient documentation

## 2020-09-10 DIAGNOSIS — G629 Polyneuropathy, unspecified: Secondary | ICD-10-CM | POA: Diagnosis not present

## 2020-09-10 DIAGNOSIS — I5032 Chronic diastolic (congestive) heart failure: Secondary | ICD-10-CM | POA: Diagnosis not present

## 2020-09-10 DIAGNOSIS — Z7901 Long term (current) use of anticoagulants: Secondary | ICD-10-CM | POA: Insufficient documentation

## 2020-09-10 DIAGNOSIS — Z9079 Acquired absence of other genital organ(s): Secondary | ICD-10-CM | POA: Diagnosis not present

## 2020-09-10 DIAGNOSIS — Z886 Allergy status to analgesic agent status: Secondary | ICD-10-CM | POA: Insufficient documentation

## 2020-09-10 DIAGNOSIS — G4733 Obstructive sleep apnea (adult) (pediatric): Secondary | ICD-10-CM | POA: Diagnosis not present

## 2020-09-10 DIAGNOSIS — M2550 Pain in unspecified joint: Secondary | ICD-10-CM | POA: Insufficient documentation

## 2020-09-10 DIAGNOSIS — Z880 Allergy status to penicillin: Secondary | ICD-10-CM | POA: Diagnosis not present

## 2020-09-10 DIAGNOSIS — R5383 Other fatigue: Secondary | ICD-10-CM | POA: Diagnosis not present

## 2020-09-10 DIAGNOSIS — C9000 Multiple myeloma not having achieved remission: Secondary | ICD-10-CM | POA: Diagnosis not present

## 2020-09-10 DIAGNOSIS — Z8546 Personal history of malignant neoplasm of prostate: Secondary | ICD-10-CM | POA: Diagnosis not present

## 2020-09-10 DIAGNOSIS — E7800 Pure hypercholesterolemia, unspecified: Secondary | ICD-10-CM | POA: Insufficient documentation

## 2020-09-10 DIAGNOSIS — Z9049 Acquired absence of other specified parts of digestive tract: Secondary | ICD-10-CM | POA: Diagnosis not present

## 2020-09-10 DIAGNOSIS — I130 Hypertensive heart and chronic kidney disease with heart failure and stage 1 through stage 4 chronic kidney disease, or unspecified chronic kidney disease: Secondary | ICD-10-CM | POA: Diagnosis not present

## 2020-09-10 DIAGNOSIS — Z888 Allergy status to other drugs, medicaments and biological substances status: Secondary | ICD-10-CM | POA: Diagnosis not present

## 2020-09-10 LAB — CMP (CANCER CENTER ONLY)
ALT: 15 U/L (ref 0–44)
AST: 19 U/L (ref 15–41)
Albumin: 3.9 g/dL (ref 3.5–5.0)
Alkaline Phosphatase: 99 U/L (ref 38–126)
Anion gap: 9 (ref 5–15)
BUN: 27 mg/dL — ABNORMAL HIGH (ref 8–23)
CO2: 22 mmol/L (ref 22–32)
Calcium: 10.1 mg/dL (ref 8.9–10.3)
Chloride: 109 mmol/L (ref 98–111)
Creatinine: 1.92 mg/dL — ABNORMAL HIGH (ref 0.61–1.24)
GFR, Estimated: 34 mL/min — ABNORMAL LOW (ref >60)
Glucose, Bld: 136 mg/dL — ABNORMAL HIGH (ref 70–99)
Potassium: 4.1 mmol/L (ref 3.5–5.1)
Sodium: 140 mmol/L (ref 135–145)
Total Bilirubin: 0.9 mg/dL (ref 0.3–1.2)
Total Protein: 6.6 g/dL (ref 6.5–8.1)

## 2020-09-10 LAB — CBC WITH DIFFERENTIAL (CANCER CENTER ONLY)
Abs Immature Granulocytes: 0.05 10*3/uL (ref 0.00–0.07)
Basophils Absolute: 0 10*3/uL (ref 0.0–0.1)
Basophils Relative: 0 %
Eosinophils Absolute: 0.1 10*3/uL (ref 0.0–0.5)
Eosinophils Relative: 2 %
HCT: 48.6 % (ref 39.0–52.0)
Hemoglobin: 16.6 g/dL (ref 13.0–17.0)
Immature Granulocytes: 1 %
Lymphocytes Relative: 30 %
Lymphs Abs: 2 10*3/uL (ref 0.7–4.0)
MCH: 32.7 pg (ref 26.0–34.0)
MCHC: 34.2 g/dL (ref 30.0–36.0)
MCV: 95.7 fL (ref 80.0–100.0)
Monocytes Absolute: 0.7 10*3/uL (ref 0.1–1.0)
Monocytes Relative: 10 %
Neutro Abs: 3.9 10*3/uL (ref 1.7–7.7)
Neutrophils Relative %: 57 %
Platelet Count: 187 10*3/uL (ref 150–400)
RBC: 5.08 MIL/uL (ref 4.22–5.81)
RDW: 13.6 % (ref 11.5–15.5)
WBC Count: 6.8 10*3/uL (ref 4.0–10.5)
nRBC: 0 % (ref 0.0–0.2)

## 2020-09-10 LAB — LACTATE DEHYDROGENASE: LDH: 199 U/L — ABNORMAL HIGH (ref 98–192)

## 2020-09-11 LAB — IGG, IGA, IGM
IgA: 75 mg/dL (ref 61–437)
IgG (Immunoglobin G), Serum: 965 mg/dL (ref 603–1613)
IgM (Immunoglobulin M), Srm: 38 mg/dL (ref 15–143)

## 2020-09-11 LAB — KAPPA/LAMBDA LIGHT CHAINS
Kappa free light chain: 45.6 mg/L — ABNORMAL HIGH (ref 3.3–19.4)
Kappa, lambda light chain ratio: 3.53 — ABNORMAL HIGH (ref 0.26–1.65)
Lambda free light chains: 12.9 mg/L (ref 5.7–26.3)

## 2020-09-11 LAB — BETA 2 MICROGLOBULIN, SERUM: Beta-2 Microglobulin: 2.5 mg/L — ABNORMAL HIGH (ref 0.6–2.4)

## 2020-09-17 ENCOUNTER — Other Ambulatory Visit: Payer: Self-pay

## 2020-09-17 ENCOUNTER — Encounter: Payer: Self-pay | Admitting: Internal Medicine

## 2020-09-17 ENCOUNTER — Inpatient Hospital Stay: Payer: Medicare Other | Admitting: Internal Medicine

## 2020-09-17 VITALS — BP 153/103 | HR 95 | Temp 96.1°F | Resp 18 | Ht 65.5 in | Wt 171.7 lb

## 2020-09-17 DIAGNOSIS — I10 Essential (primary) hypertension: Secondary | ICD-10-CM

## 2020-09-17 DIAGNOSIS — C9000 Multiple myeloma not having achieved remission: Secondary | ICD-10-CM

## 2020-09-17 NOTE — Progress Notes (Signed)
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 2) History of pheochromocytoma LEFT adrenal gland status post resection on 03/16/2009 3) Prostate adenocarcinoma  PRIOR THERAPY: Systemic therapy with weekly Velcade 1.3 mg/KG subcutaneously, Revlimid 25 mg p.o. daily for 21 days every 4 weeks as well as Decadron 20 mg p.o. weekly.  First dose November 07, 2017.  Status post 7 cycles.  CURRENT THERAPY: Observation.  INTERVAL HISTORY: Thomas [name] 85 y.o. male returns to the clinic today for follow-up visit.  The patient is feeling fine today with no concerning complaints except for fatigue.  He has some stress recently dealing with his wife's medical issues.  He denied having any current chest pain, shortness of breath, cough or hemoptysis.  He denied having any fever or chills.  He has no nausea, vomiting, diarrhea or constipation.  He denied having any headache or visual changes.  The patient had repeat myeloma panel performed recently and is here for evaluation and discussion of his lab results.   MEDICAL HISTORY: Past Medical History:  Diagnosis Date  . BPH (benign prostatic hypertrophy)   . Chronic diastolic heart failure ([place]) 12/27/2013  . Chronic kidney disease    Hx of bilateral renal cysts.  . Encounter for antineoplastic chemotherapy 02/20/2018  . Glaucoma   . Heart murmur   . History of atrial fibrillation 03/09/09  . Hx of epiglottitis    2011  . Hypercholesterolemia   . Hypertension   . Long term current use of amiodarone 11/07/2013   Amiodarone   . Mild aortic stenosis   . OSA on CPAP 06/26/2014  . Paroxysmal atrial fibrillation ([place]) 11/07/2013  . Prostate cancer ([facility]) 7/12   T1C Ad Ca  s/p TURP, observation only  .  Renal insufficiency 09/06/2011  . Sinus node dysfunction ([place]) 08/14/2014  . Sleep apnea   . Smoldering multiple myeloma ([facility])     ALLERGIES:  is allergic to benadryl [diphenhydramine hcl], ceftriaxone sodium, contrast media [iodinated diagnostic agents], nsaids, penicillins, levaquin [levofloxacin], oxybutynin, and chlorhexidine.  MEDICATIONS:  Current Outpatient Medications  Medication Sig Dispense Refill  . acetaminophen (TYLENOL) 500 MG tablet Take 1,000 mg by mouth as needed.     Marland Kitchen apixaban (ELIQUIS) 2.5 MG TABS tablet TAKE 1 TABLET (2.5 MG TOTAL) BY MOUTH 2 (TWO) TIMES DAILY. 180 tablet 0  . Cholecalciferol (VITAMIN D3) 50 MCG (2000 UT) capsule Take 1 capsule by mouth daily.     Marland Kitchen diltiazem (CARTIA XT) 120 MG 24 hr capsule TAKE ONE CAPSULE BY MOUTH ONCE DAILY 90 capsule 2  . dorzolamide-timolol (COSOPT) 22.3-6.8 MG/ML ophthalmic solution INSTILL 1 DROP INTO BOTH EYES TWICE A DAY    . latanoprost (XALATAN) 0.005 % ophthalmic solution INSTILL 1 DROP INTO BOTH EYES EVERY DAY AT NIGHT  6   No current facility-administered medications for this visit.    SURGICAL HISTORY:  Past Surgical History:  Procedure Laterality Date  . ADRENALECTOMY  02/12/11   Left, for Pheochromocytoma  . CARDIOVERSION N/A 12/26/2013   Procedure: CARDIOVERSION;  Surgeon: [name], MD;  Location: [facility];  Service: Cardiovascular;  Laterality: N/A;  . CHOLECYSTECTOMY  02/12/11  . GASTRECTOMY  1984   S/P gastrectomy for ulcer  . [place]  S/P Right knee surgery  . TEE WITHOUT CARDIOVERSION N/A 12/26/2013   Procedure: TRANSESOPHAGEAL ECHOCARDIOGRAM (TEE);  Surgeon: [name], MD;  Location: [facility];  Service: Cardiovascular;  Laterality: N/A;  . TRANSURETHRAL RESECTION OF PROSTATE  7/12    REVIEW OF SYSTEMS:  A comprehensive review of systems was negative except for: Constitutional: positive for fatigue Musculoskeletal: positive for arthralgias   PHYSICAL EXAMINATION: General  appearance: alert, cooperative and no distress Head: Normocephalic, without obvious abnormality, atraumatic Neck: no adenopathy, no JVD, supple, symmetrical, trachea midline and thyroid not enlarged, symmetric, no tenderness/mass/nodules Lymph nodes: Cervical, supraclavicular, and axillary nodes normal. Resp: clear to auscultation bilaterally Back: symmetric, no curvature. ROM normal. No CVA tenderness. Cardio: regular rate and rhythm, S1, S2 normal, no murmur, click, rub or gallop GI: soft, non-tender; bowel sounds normal; no masses,  no organomegaly Extremities: extremities normal, atraumatic, no cyanosis or edema  ECOG PERFORMANCE STATUS: 1 - Symptomatic but completely ambulatory  Blood pressure (!) 153/103, pulse 95, temperature (!) 96.1 F (35.6 C), temperature source Tympanic, resp. rate 18, height 5' 5.5" (1.664 m), weight 171 lb 11.2 oz (77.9 kg), SpO2 98 %.  LABORATORY DATA: Lab Results  Component Value Date   WBC 6.8 09/10/2020   HGB 16.6 09/10/2020   HCT 48.6 09/10/2020   MCV 95.7 09/10/2020   PLT 187 09/10/2020      Chemistry      Component Value Date/Time   NA 140 09/10/2020 0928   NA 138 08/16/2017 1404   K 4.1 09/10/2020 0928   K 4.5 08/16/2017 1404   CL 109 09/10/2020 0928   CL 109 (H) 03/21/2013 0958   CO2 22 09/10/2020 0928   CO2 24 08/16/2017 1404   BUN 27 (H) 09/10/2020 0928   BUN 17.3 08/16/2017 1404   CREATININE 1.92 (H) 09/10/2020 0928   CREATININE 1.8 (H) 08/16/2017 1404      Component Value Date/Time   CALCIUM 10.1 09/10/2020 0928   CALCIUM 9.9 08/16/2017 1404   ALKPHOS 99 09/10/2020 0928   ALKPHOS 86 08/16/2017 1404   AST 19 09/10/2020 0928   AST 22 08/16/2017 1404   ALT 15 09/10/2020 0928   ALT 16 08/16/2017 1404   BILITOT 0.9 09/10/2020 0928   BILITOT 0.78 08/16/2017 1404       RADIOGRAPHIC STUDIES: No results found.  ASSESSMENT AND PLAN:  This is a very pleasant 85 years old white male with IgG smoldering [place] diagnosed in April  2011. The patient has been on observation since 2011. His recent myeloma panel showed further increase in the free kappa light chain. His recent skeletal bone survey showed no concerning findings for disease progression.  He has a focus of avascular necrosis but no significant lytic lesions. His bone marrow bone marrow biopsy and aspirate showed further increase in the plasma cells to 15%. The patient underwent treatment with subcutaneous weekly Velcade as well as Revlimid and Decadron status post 7 cycles. He tolerated the previous treatment well except for mild peripheral neuropathy. He is currently on observation and feeling fine except for mild fatigue. He had repeat myeloma panel performed recently.  It showed no concerning findings for progression except for worsening of his renal function but this could be multifactorial. I recommended for the patient to continue on observation with repeat myeloma panel in 3 months. For hypertension he will take his blood pressure medication as prescribed and monitor it closely at home. He was advised to call immediately if he has any concerning  symptoms in the interval. The patient voices understanding of current disease status and treatment options and is in agreement with the current care plan. All questions were answered. The patient knows to call the clinic with any problems, questions or concerns. We can certainly see the patient much sooner if necessary.  Disclaimer: This note was dictated with voice recognition software. Similar sounding words can inadvertently be transcribed and may not be corrected upon review.

## 2020-09-28 NOTE — Progress Notes (Signed)
Cardiology Office Note:    Date:  10/01/2020   ID:  Thomas [name], DOB [birth year], MRN [id number]  PCP:  [name], MD  Cardiologist:  [name], MD   Referring MD: [name], MD   Chief Complaint  Patient presents with  . Atrial Fibrillation    History of Present Illness:    Thomas [name] is a 85 y.o. male with a hx of  paroxysmal atrial fibrillation, chronic diastolic heart failure, hypertension, chronic kidney disease, unable to use anticoagulation therapy due to recurrent GU bleeding, and mild aortic stenosis.Other medical issues include metastatic prostate CA ([place]) and indolent multiple myeloma   He is doing well.  He denies syncope.  No palpitations.  Does have dyspnea on exertion.  No orthopnea or PND.  No peripheral edema.  He denies angina.  Past Medical History:  Diagnosis Date  . BPH (benign prostatic hypertrophy)   . Chronic diastolic heart failure ([place]) 12/27/2013  . Chronic kidney disease    Hx of bilateral renal cysts.  . Encounter for antineoplastic chemotherapy 02/20/2018  . Glaucoma   . Heart murmur   . History of atrial fibrillation 03/09/09  . Hx of epiglottitis    2011  . Hypercholesterolemia   . Hypertension   . Long term current use of amiodarone 11/07/2013   Amiodarone   . Mild aortic stenosis   . OSA on CPAP 06/26/2014  . Paroxysmal atrial fibrillation ([facility]) 11/07/2013  . Prostate cancer ([facility]) 7/12   T1C Ad Ca  s/p TURP, observation only  . Renal insufficiency 09/06/2011  . Sinus node dysfunction ([facility] [place]) 08/14/2014  . Sleep apnea   . Smoldering multiple myeloma ([place])     Past Surgical History:  Procedure Laterality Date  . ADRENALECTOMY  02/12/11   Left, for Pheochromocytoma  . CARDIOVERSION N/A 12/26/2013   Procedure: CARDIOVERSION;  Surgeon: [name], MD;  Location: [facility];  Service: Cardiovascular;  Laterality: N/A;  . CHOLECYSTECTOMY  02/12/11  . GASTRECTOMY  1984   S/P gastrectomy for ulcer  . KNEE  SURGERY  1971   S/P Right knee surgery  . TEE WITHOUT CARDIOVERSION N/A 12/26/2013   Procedure: TRANSESOPHAGEAL ECHOCARDIOGRAM (TEE);  Surgeon: [name], MD;  Location: [facility];  Service: Cardiovascular;  Laterality: N/A;  . TRANSURETHRAL RESECTION OF PROSTATE  7/12    Current Medications: Current Meds  Medication Sig  . acetaminophen (TYLENOL) 500 MG tablet Take 1,000 mg by mouth as needed.   Marland Kitchen apixaban (ELIQUIS) 2.5 MG TABS tablet TAKE 1 TABLET (2.5 MG TOTAL) BY MOUTH 2 (TWO) TIMES DAILY.  Marland Kitchen Cholecalciferol (VITAMIN D3) 50 MCG (2000 UT) capsule Take 1 capsule by mouth daily.   Marland Kitchen diltiazem (CARTIA XT) 120 MG 24 hr capsule TAKE ONE CAPSULE BY MOUTH ONCE DAILY  . dorzolamide-timolol (COSOPT) 22.3-6.8 MG/ML ophthalmic solution INSTILL 1 DROP INTO BOTH EYES TWICE A DAY  . latanoprost (XALATAN) 0.005 % ophthalmic solution INSTILL 1 DROP INTO BOTH EYES EVERY DAY AT NIGHT     Allergies:   Benadryl [diphenhydramine hcl], Ceftriaxone sodium, Contrast media [iodinated diagnostic agents], Nsaids, Penicillins, Codeine, Ibuprofen, Levaquin [levofloxacin], Oxybutynin, Tramadol hcl, and Chlorhexidine   Social History   Socioeconomic History  . Marital status: Married    Spouse name: Not on file  . Number of children: 3  . Years of education: Not on file  . Highest education level: Not on file  Occupational History    Employer: RETIRED  Tobacco Use  .  Smoking status: Never Smoker  . Smokeless tobacco: Never Used  Vaping Use  . Vaping Use: Never used  Substance and Sexual Activity  . Alcohol use: No  . Drug use: No  . Sexual activity: Not Currently  Other Topics Concern  . Not on file  Social History Narrative  . Not on file   Social Determinants of Health   Financial Resource Strain: Not on file  Food Insecurity: Not on file  Transportation Needs: Not on file  Physical Activity: Not on file  Stress: Not on file  Social Connections: Not on file     Family History: The  patient's family history includes Kidney Stones in his brother; Liver disease in his sister, sister, and sister; Melanoma in his brother; Polymyositis in his mother; Prostate cancer in his brother and father.  ROS:   Please see the history of present illness.    Has not noticed blood in his urine or stool.  Has not had syncope.  All other systems reviewed and are negative.  EKGs/Labs/Other Studies Reviewed:    The following studies were reviewed today: No new data  EKG:  EKG not performed today.  Recent Labs: 09/10/2020: ALT 15; BUN 27; Creatinine 1.92; Hemoglobin 16.6; Platelet Count 187; Potassium 4.1; Sodium 140  Recent Lipid Panel    Component Value Date/Time   CHOL 164 02/25/2020 0842   TRIG 131 02/25/2020 0842   HDL 41 02/25/2020 0842   CHOLHDL 4.0 02/25/2020 0842   LDLCALC 100 (H) 02/25/2020 0842    Physical Exam:    VS:  BP 140/72   Pulse 88   Ht 5' 5.5" (1.664 m)   Wt 173 lb 9.6 oz (78.7 kg)   SpO2 97%   BMI 28.45 kg/m     Wt Readings from Last 3 Encounters:  10/01/20 173 lb 9.6 oz (78.7 kg)  09/17/20 171 lb 11.2 oz (77.9 kg)  08/24/20 173 lb 3.2 oz (78.6 kg)     GEN: Consistent with age.. No acute distress HEENT: Normal NECK: No JVD. LYMPHATICS: No lymphadenopathy CARDIAC: 2/6 systolic right upper sternal murmur.  Irregularly irregular RR with no gallop, or edema. VASCULAR:  Normal Pulses. No bruits. RESPIRATORY:  Clear to auscultation without rales, wheezing or rhonchi  ABDOMEN: Soft, non-tender, non-distended, No pulsatile mass, MUSCULOSKELETAL: No deformity  SKIN: Warm and dry NEUROLOGIC:  Alert and oriented x 3 PSYCHIATRIC:  Normal affect   ASSESSMENT:    1. Persistent atrial fibrillation ([place])   2. Chronic diastolic heart failure ([place])   3. OSA on CPAP   4. Chronic anticoagulation   5. Mild aortic stenosis   6. Multiple myeloma not having achieved remission (HCC)   7. Stage 3b chronic kidney disease ([place])   8. Essential hypertension   9.  Hypercholesterolemia   10. Educated about COVID-19 virus infection    PLAN:    In order of problems listed above:  1. Good rate control on exam today.  Continue diltiazem 120 mg/day. 2. No evidence of volume overload. 3. Compliant with CPAP. 4. Continue 2.5 mg twice daily of Eliquis.  Report any bleeding. 5. Will determine if echo needs to be done at the time of next office visit. 6. Controlled 7. Developing stage IV CKD.   8. Excellent blood pressure control 9. Vaccinated and boosted.   Medication Adjustments/Labs and Tests Ordered: Current medicines are reviewed at length with the patient today.  Concerns regarding medicines are outlined above.  No orders of the defined types were  placed in this encounter.  No orders of the defined types were placed in this encounter.   Patient Instructions  Medication Instructions:  Your physician recommends that you continue on your current medications as directed. Please refer to the Current Medication list given to you today.  *If you need a refill on your cardiac medications before your next appointment, please call your pharmacy*   Lab Work: None If you have labs (blood work) drawn today and your tests are completely normal, you will receive your results only by: Marland Kitchen MyChart Message (if you have MyChart) OR . A paper copy in the mail If you have any lab test that is abnormal or we need to change your treatment, we will call you to review the results.   Testing/Procedures: None   Follow-Up: At [facility], you and your health needs are our priority.  As part of our continuing mission to provide you with exceptional heart care, we have created designated Provider Care Teams.  These Care Teams include your primary Cardiologist (physician) and Advanced Practice Providers (APPs -  Physician Assistants and Nurse Practitioners) who all work together to provide you with the care you need, when you need it.  We recommend signing up for  the patient portal called "MyChart".  Sign up information is provided on this After Visit Summary.  MyChart is used to connect with patients for Virtual Visits (Telemedicine).  Patients are able to view lab/test results, encounter notes, upcoming appointments, etc.  Non-urgent messages can be sent to your provider as well.   To learn more about what you can do with MyChart, go to [email or web address].    Your next appointment:   6 month(s)  The format for your next appointment:   In Person  Provider:   You may see [name], MD or one of the following Advanced Practice Providers on your designated Care Team:    Truitt Merle, NP  Cecilie Kicks, NP  Kathyrn Drown, NP    Other Instructions      Signed, [name], MD  10/01/2020 4:28 PM    [place]

## 2020-10-01 ENCOUNTER — Encounter: Payer: Self-pay | Admitting: Interventional Cardiology

## 2020-10-01 ENCOUNTER — Other Ambulatory Visit: Payer: Self-pay

## 2020-10-01 ENCOUNTER — Ambulatory Visit: Payer: Medicare Other | Admitting: Interventional Cardiology

## 2020-10-01 VITALS — BP 140/72 | HR 88 | Ht 65.5 in | Wt 173.6 lb

## 2020-10-01 DIAGNOSIS — Z7901 Long term (current) use of anticoagulants: Secondary | ICD-10-CM

## 2020-10-01 DIAGNOSIS — N1832 Chronic kidney disease, stage 3b: Secondary | ICD-10-CM

## 2020-10-01 DIAGNOSIS — Z9989 Dependence on other enabling machines and devices: Secondary | ICD-10-CM

## 2020-10-01 DIAGNOSIS — G4733 Obstructive sleep apnea (adult) (pediatric): Secondary | ICD-10-CM

## 2020-10-01 DIAGNOSIS — I4819 Other persistent atrial fibrillation: Secondary | ICD-10-CM | POA: Diagnosis not present

## 2020-10-01 DIAGNOSIS — I350 Nonrheumatic aortic (valve) stenosis: Secondary | ICD-10-CM

## 2020-10-01 DIAGNOSIS — I5032 Chronic diastolic (congestive) heart failure: Secondary | ICD-10-CM

## 2020-10-01 DIAGNOSIS — I10 Essential (primary) hypertension: Secondary | ICD-10-CM

## 2020-10-01 DIAGNOSIS — Z7189 Other specified counseling: Secondary | ICD-10-CM

## 2020-10-01 DIAGNOSIS — C9000 Multiple myeloma not having achieved remission: Secondary | ICD-10-CM

## 2020-10-01 DIAGNOSIS — E7800 Pure hypercholesterolemia, unspecified: Secondary | ICD-10-CM

## 2020-10-01 NOTE — Patient Instructions (Signed)

## 2020-10-08 ENCOUNTER — Other Ambulatory Visit: Payer: Self-pay | Admitting: Interventional Cardiology

## 2020-10-08 NOTE — Telephone Encounter (Signed)
Pt last saw Dr Katrinka Blazing 10/01/20, last labs 09/10/20 Creat 1.92, age 86, weight 78.7kg, based on specified criteria pt is on appropriate dosage of Eliquis 2.5mg  BID.  Will refill rx.

## 2020-10-12 DIAGNOSIS — G459 Transient cerebral ischemic attack, unspecified: Secondary | ICD-10-CM | POA: Diagnosis not present

## 2020-10-12 DIAGNOSIS — M1711 Unilateral primary osteoarthritis, right knee: Secondary | ICD-10-CM | POA: Diagnosis not present

## 2020-10-12 DIAGNOSIS — E032 Hypothyroidism due to medicaments and other exogenous substances: Secondary | ICD-10-CM | POA: Diagnosis not present

## 2020-10-12 DIAGNOSIS — I480 Paroxysmal atrial fibrillation: Secondary | ICD-10-CM | POA: Diagnosis not present

## 2020-10-12 DIAGNOSIS — I129 Hypertensive chronic kidney disease with stage 1 through stage 4 chronic kidney disease, or unspecified chronic kidney disease: Secondary | ICD-10-CM | POA: Diagnosis not present

## 2020-10-12 DIAGNOSIS — E785 Hyperlipidemia, unspecified: Secondary | ICD-10-CM | POA: Diagnosis not present

## 2020-10-15 DIAGNOSIS — G542 Cervical root disorders, not elsewhere classified: Secondary | ICD-10-CM | POA: Diagnosis not present

## 2020-10-15 DIAGNOSIS — R42 Dizziness and giddiness: Secondary | ICD-10-CM | POA: Diagnosis not present

## 2020-10-15 DIAGNOSIS — G459 Transient cerebral ischemic attack, unspecified: Secondary | ICD-10-CM | POA: Diagnosis not present

## 2020-10-16 ENCOUNTER — Other Ambulatory Visit: Payer: Self-pay | Admitting: Family Medicine

## 2020-10-16 DIAGNOSIS — G459 Transient cerebral ischemic attack, unspecified: Secondary | ICD-10-CM

## 2020-10-17 ENCOUNTER — Other Ambulatory Visit: Payer: Self-pay | Admitting: Family Medicine

## 2020-10-17 DIAGNOSIS — G459 Transient cerebral ischemic attack, unspecified: Secondary | ICD-10-CM

## 2020-10-22 ENCOUNTER — Ambulatory Visit
Admission: RE | Admit: 2020-10-22 | Discharge: 2020-10-22 | Disposition: A | Discharge status: Home / Self Care | Payer: Medicare Other | Source: Ambulatory Visit | Attending: Family Medicine | Admitting: Family Medicine

## 2020-10-22 DIAGNOSIS — G459 Transient cerebral ischemic attack, unspecified: Secondary | ICD-10-CM

## 2020-10-22 DIAGNOSIS — I4891 Unspecified atrial fibrillation: Secondary | ICD-10-CM | POA: Diagnosis not present

## 2020-10-22 DIAGNOSIS — I6523 Occlusion and stenosis of bilateral carotid arteries: Secondary | ICD-10-CM | POA: Diagnosis not present

## 2020-10-22 DIAGNOSIS — I10 Essential (primary) hypertension: Secondary | ICD-10-CM | POA: Diagnosis not present

## 2020-10-22 DIAGNOSIS — H539 Unspecified visual disturbance: Secondary | ICD-10-CM | POA: Diagnosis not present

## 2020-10-27 ENCOUNTER — Other Ambulatory Visit (HOSPITAL_COMMUNITY): Payer: Self-pay | Admitting: Urology

## 2020-10-27 DIAGNOSIS — C61 Malignant neoplasm of prostate: Secondary | ICD-10-CM

## 2020-10-29 DIAGNOSIS — M546 Pain in thoracic spine: Secondary | ICD-10-CM | POA: Diagnosis not present

## 2020-10-29 DIAGNOSIS — M542 Cervicalgia: Secondary | ICD-10-CM | POA: Diagnosis not present

## 2020-10-29 DIAGNOSIS — M5412 Radiculopathy, cervical region: Secondary | ICD-10-CM | POA: Diagnosis not present

## 2020-10-29 DIAGNOSIS — M6281 Muscle weakness (generalized): Secondary | ICD-10-CM | POA: Diagnosis not present

## 2020-11-02 DIAGNOSIS — M6281 Muscle weakness (generalized): Secondary | ICD-10-CM | POA: Diagnosis not present

## 2020-11-02 DIAGNOSIS — M542 Cervicalgia: Secondary | ICD-10-CM | POA: Diagnosis not present

## 2020-11-02 DIAGNOSIS — M5412 Radiculopathy, cervical region: Secondary | ICD-10-CM | POA: Diagnosis not present

## 2020-11-02 DIAGNOSIS — M546 Pain in thoracic spine: Secondary | ICD-10-CM | POA: Diagnosis not present

## 2020-11-05 DIAGNOSIS — M5412 Radiculopathy, cervical region: Secondary | ICD-10-CM | POA: Diagnosis not present

## 2020-11-05 DIAGNOSIS — M6281 Muscle weakness (generalized): Secondary | ICD-10-CM | POA: Diagnosis not present

## 2020-11-05 DIAGNOSIS — M546 Pain in thoracic spine: Secondary | ICD-10-CM | POA: Diagnosis not present

## 2020-11-05 DIAGNOSIS — M542 Cervicalgia: Secondary | ICD-10-CM | POA: Diagnosis not present

## 2020-11-06 ENCOUNTER — Ambulatory Visit
Admission: RE | Admit: 2020-11-06 | Discharge: 2020-11-06 | Disposition: A | Discharge status: Home / Self Care | Payer: Medicare Other | Source: Ambulatory Visit | Attending: Family Medicine | Admitting: Family Medicine

## 2020-11-06 DIAGNOSIS — G459 Transient cerebral ischemic attack, unspecified: Secondary | ICD-10-CM | POA: Diagnosis not present

## 2020-11-06 DIAGNOSIS — G319 Degenerative disease of nervous system, unspecified: Secondary | ICD-10-CM | POA: Diagnosis not present

## 2020-11-06 DIAGNOSIS — I6389 Other cerebral infarction: Secondary | ICD-10-CM | POA: Diagnosis not present

## 2020-11-09 ENCOUNTER — Other Ambulatory Visit: Payer: Self-pay

## 2020-11-09 ENCOUNTER — Ambulatory Visit (HOSPITAL_COMMUNITY)
Admission: RE | Admit: 2020-11-09 | Discharge: 2020-11-09 | Disposition: A | Discharge status: Home / Self Care | Payer: Medicare Other | Source: Ambulatory Visit | Attending: Urology | Admitting: Urology

## 2020-11-09 ENCOUNTER — Telehealth: Payer: Self-pay

## 2020-11-09 DIAGNOSIS — C61 Malignant neoplasm of prostate: Secondary | ICD-10-CM | POA: Diagnosis present

## 2020-11-09 MED ORDER — PIFLIFOLASTAT F 18 (PYLARIFY) INJECTION
9.0000 | Freq: Once | INTRAVENOUS | Status: AC
  Administered 2020-11-09: 9.54 via INTRAVENOUS

## 2020-11-09 NOTE — Telephone Encounter (Signed)
Paperwork completed and faxed to [facility]

## 2020-11-09 NOTE — Telephone Encounter (Signed)
**Note De-Identified Simisola Sandles Obfuscation** The provider page of a BMSPAF application for Eliquis received from Noemi Chapel with [employer] with a message requesting that we have Dr Tamala Julian sign and date it and to fax back to them. I made some changes on the application since Dr Tamala Julian is the prescribing physician and to prevent any delays in the application process with BMSPAF.   I have emailed the form to Dr Darliss Ridgel nurse so she can obtain his signature, date it and to fax back to Mickel Baas at fax number written on cover letter included.

## 2020-11-10 DIAGNOSIS — M6281 Muscle weakness (generalized): Secondary | ICD-10-CM | POA: Diagnosis not present

## 2020-11-10 DIAGNOSIS — M546 Pain in thoracic spine: Secondary | ICD-10-CM | POA: Diagnosis not present

## 2020-11-10 DIAGNOSIS — M542 Cervicalgia: Secondary | ICD-10-CM | POA: Diagnosis not present

## 2020-11-10 DIAGNOSIS — M5412 Radiculopathy, cervical region: Secondary | ICD-10-CM | POA: Diagnosis not present

## 2020-11-12 DIAGNOSIS — M6281 Muscle weakness (generalized): Secondary | ICD-10-CM | POA: Diagnosis not present

## 2020-11-12 DIAGNOSIS — M5412 Radiculopathy, cervical region: Secondary | ICD-10-CM | POA: Diagnosis not present

## 2020-11-12 DIAGNOSIS — M542 Cervicalgia: Secondary | ICD-10-CM | POA: Diagnosis not present

## 2020-11-12 DIAGNOSIS — M546 Pain in thoracic spine: Secondary | ICD-10-CM | POA: Diagnosis not present

## 2020-11-13 ENCOUNTER — Encounter: Payer: Self-pay | Admitting: Medical Oncology

## 2020-11-16 ENCOUNTER — Other Ambulatory Visit: Payer: Medicare Other

## 2020-11-16 NOTE — Progress Notes (Signed)
I called pt to introduce myself as the Prostate Nurse Navigator and the Coordinator of the Prostate [place] [facility].  1. I confirmed with the patient he is aware of his referral to the clinic 11/24/20, arriving at 12:30 PM.  2. I discussed the format of the clinic and the physicians he will be seeing that day.  3. I discussed where the clinic is located and how to contact me.  4. I confirmed his address and informed him I would be mailing a packet of information and forms to be completed. I asked him to bring them with him the day of his appointment.   He voiced understanding of the above. I asked him to call me if he has any questions or concerns regarding his appointments or the forms he needs to complete.

## 2020-11-17 DIAGNOSIS — M6281 Muscle weakness (generalized): Secondary | ICD-10-CM | POA: Diagnosis not present

## 2020-11-17 DIAGNOSIS — M546 Pain in thoracic spine: Secondary | ICD-10-CM | POA: Diagnosis not present

## 2020-11-17 DIAGNOSIS — M5412 Radiculopathy, cervical region: Secondary | ICD-10-CM | POA: Diagnosis not present

## 2020-11-17 DIAGNOSIS — M542 Cervicalgia: Secondary | ICD-10-CM | POA: Diagnosis not present

## 2020-11-19 ENCOUNTER — Encounter: Payer: Self-pay | Admitting: Medical Oncology

## 2020-11-19 DIAGNOSIS — M6281 Muscle weakness (generalized): Secondary | ICD-10-CM | POA: Diagnosis not present

## 2020-11-19 DIAGNOSIS — M542 Cervicalgia: Secondary | ICD-10-CM | POA: Diagnosis not present

## 2020-11-19 DIAGNOSIS — M546 Pain in thoracic spine: Secondary | ICD-10-CM | POA: Diagnosis not present

## 2020-11-19 DIAGNOSIS — M5412 Radiculopathy, cervical region: Secondary | ICD-10-CM | POA: Diagnosis not present

## 2020-11-23 ENCOUNTER — Encounter: Payer: Self-pay | Admitting: Medical Oncology

## 2020-11-23 NOTE — Progress Notes (Signed)
GU Location of Tumor / Histology: prostatic adenocarcinoma  If Prostate Cancer, Gleason Score is (3 + 3) and PSA is (6.1) when diagnosed in 05/2010. Thomas [name] opted for active surveillance. Unfortunately, he developed progression with an upgraded gleason score of 3+4 in 2017. April 2021 PSA nadir on ADT was 12.        Past/Anticipated interventions by urology, if any: prostate biopsy x 4, active surveillance, PET (activity within prostate gland but no vascular or bony mets), referral to [facility]  Past/Anticipated interventions by medical oncology, if any: no  Weight changes, if any: denies  Bowel/Bladder complaints, if any: IPSS 20. SHIM 1. Reports incontinence. Reports urinary frequency. Denies any bowel complaints.  Nausea/Vomiting, if any: denies  Pain issues, if any:  Reports low back and knee pain.   SAFETY ISSUES:  Prior radiation? denies  Pacemaker/ICD? denies  Possible current pregnancy? no, male patient  Is the patient on methotrexate? no  Current Complaints / other details:  86 year old male. Married. Resides in [place]. Retired. Father and brother with history of prostate cancer. Brother with history of melanoma.

## 2020-11-24 ENCOUNTER — Encounter: Payer: Self-pay | Admitting: General Practice

## 2020-11-24 ENCOUNTER — Encounter: Payer: Self-pay | Admitting: Radiation Oncology

## 2020-11-24 ENCOUNTER — Other Ambulatory Visit: Payer: Self-pay

## 2020-11-24 ENCOUNTER — Ambulatory Visit
Admission: RE | Admit: 2020-11-24 | Discharge: 2020-11-24 | Disposition: A | Discharge status: Home / Self Care | Payer: Medicare Other | Source: Ambulatory Visit | Attending: Radiation Oncology | Admitting: Radiation Oncology

## 2020-11-24 ENCOUNTER — Inpatient Hospital Stay: Payer: Medicare Other | Attending: Internal Medicine | Admitting: Oncology

## 2020-11-24 ENCOUNTER — Encounter: Payer: Self-pay | Admitting: Medical Oncology

## 2020-11-24 VITALS — BP 151/78 | HR 78 | Temp 97.4°F | Resp 20 | Ht 65.0 in | Wt 179.4 lb

## 2020-11-24 DIAGNOSIS — Z886 Allergy status to analgesic agent status: Secondary | ICD-10-CM | POA: Diagnosis not present

## 2020-11-24 DIAGNOSIS — Z9079 Acquired absence of other genital organ(s): Secondary | ICD-10-CM | POA: Diagnosis not present

## 2020-11-24 DIAGNOSIS — Z7901 Long term (current) use of anticoagulants: Secondary | ICD-10-CM | POA: Diagnosis not present

## 2020-11-24 DIAGNOSIS — M8588 Other specified disorders of bone density and structure, other site: Secondary | ICD-10-CM | POA: Insufficient documentation

## 2020-11-24 DIAGNOSIS — E032 Hypothyroidism due to medicaments and other exogenous substances: Secondary | ICD-10-CM | POA: Insufficient documentation

## 2020-11-24 DIAGNOSIS — Z8042 Family history of malignant neoplasm of prostate: Secondary | ICD-10-CM | POA: Diagnosis not present

## 2020-11-24 DIAGNOSIS — Z79899 Other long term (current) drug therapy: Secondary | ICD-10-CM | POA: Diagnosis not present

## 2020-11-24 DIAGNOSIS — I5032 Chronic diastolic (congestive) heart failure: Secondary | ICD-10-CM | POA: Insufficient documentation

## 2020-11-24 DIAGNOSIS — E440 Moderate protein-calorie malnutrition: Secondary | ICD-10-CM | POA: Insufficient documentation

## 2020-11-24 DIAGNOSIS — C9000 Multiple myeloma not having achieved remission: Secondary | ICD-10-CM

## 2020-11-24 DIAGNOSIS — N189 Chronic kidney disease, unspecified: Secondary | ICD-10-CM | POA: Insufficient documentation

## 2020-11-24 DIAGNOSIS — Z841 Family history of disorders of kidney and ureter: Secondary | ICD-10-CM | POA: Diagnosis not present

## 2020-11-24 DIAGNOSIS — I130 Hypertensive heart and chronic kidney disease with heart failure and stage 1 through stage 4 chronic kidney disease, or unspecified chronic kidney disease: Secondary | ICD-10-CM | POA: Diagnosis not present

## 2020-11-24 DIAGNOSIS — I639 Cerebral infarction, unspecified: Secondary | ICD-10-CM | POA: Diagnosis not present

## 2020-11-24 DIAGNOSIS — Z885 Allergy status to narcotic agent status: Secondary | ICD-10-CM | POA: Insufficient documentation

## 2020-11-24 DIAGNOSIS — G459 Transient cerebral ischemic attack, unspecified: Secondary | ICD-10-CM | POA: Insufficient documentation

## 2020-11-24 DIAGNOSIS — Z808 Family history of malignant neoplasm of other organs or systems: Secondary | ICD-10-CM | POA: Insufficient documentation

## 2020-11-24 DIAGNOSIS — Z8546 Personal history of malignant neoplasm of prostate: Secondary | ICD-10-CM | POA: Diagnosis not present

## 2020-11-24 DIAGNOSIS — Z9049 Acquired absence of other specified parts of digestive tract: Secondary | ICD-10-CM | POA: Insufficient documentation

## 2020-11-24 DIAGNOSIS — Z86018 Personal history of other benign neoplasm: Secondary | ICD-10-CM | POA: Insufficient documentation

## 2020-11-24 DIAGNOSIS — G4733 Obstructive sleep apnea (adult) (pediatric): Secondary | ICD-10-CM | POA: Insufficient documentation

## 2020-11-24 DIAGNOSIS — Z888 Allergy status to other drugs, medicaments and biological substances status: Secondary | ICD-10-CM | POA: Insufficient documentation

## 2020-11-24 DIAGNOSIS — C61 Malignant neoplasm of prostate: Secondary | ICD-10-CM | POA: Diagnosis present

## 2020-11-24 DIAGNOSIS — Z880 Allergy status to penicillin: Secondary | ICD-10-CM | POA: Diagnosis not present

## 2020-11-24 DIAGNOSIS — M109 Gout, unspecified: Secondary | ICD-10-CM | POA: Insufficient documentation

## 2020-11-24 DIAGNOSIS — Z9981 Dependence on supplemental oxygen: Secondary | ICD-10-CM | POA: Insufficient documentation

## 2020-11-24 DIAGNOSIS — Z881 Allergy status to other antibiotic agents status: Secondary | ICD-10-CM | POA: Insufficient documentation

## 2020-11-24 DIAGNOSIS — I480 Paroxysmal atrial fibrillation: Secondary | ICD-10-CM | POA: Diagnosis not present

## 2020-11-24 DIAGNOSIS — E7800 Pure hypercholesterolemia, unspecified: Secondary | ICD-10-CM | POA: Insufficient documentation

## 2020-11-24 DIAGNOSIS — Z8269 Family history of other diseases of the musculoskeletal system and connective tissue: Secondary | ICD-10-CM | POA: Diagnosis not present

## 2020-11-24 DIAGNOSIS — M47812 Spondylosis without myelopathy or radiculopathy, cervical region: Secondary | ICD-10-CM | POA: Diagnosis not present

## 2020-11-24 DIAGNOSIS — M1710 Unilateral primary osteoarthritis, unspecified knee: Secondary | ICD-10-CM | POA: Insufficient documentation

## 2020-11-24 DIAGNOSIS — D6869 Other thrombophilia: Secondary | ICD-10-CM | POA: Insufficient documentation

## 2020-11-24 DIAGNOSIS — Z8379 Family history of other diseases of the digestive system: Secondary | ICD-10-CM | POA: Diagnosis not present

## 2020-11-24 DIAGNOSIS — H409 Unspecified glaucoma: Secondary | ICD-10-CM | POA: Insufficient documentation

## 2020-11-24 DIAGNOSIS — M179 Osteoarthritis of knee, unspecified: Secondary | ICD-10-CM | POA: Insufficient documentation

## 2020-11-24 DIAGNOSIS — N400 Enlarged prostate without lower urinary tract symptoms: Secondary | ICD-10-CM | POA: Diagnosis not present

## 2020-11-24 DIAGNOSIS — G542 Cervical root disorders, not elsewhere classified: Secondary | ICD-10-CM | POA: Insufficient documentation

## 2020-11-24 NOTE — Progress Notes (Addendum)
Reason for the request:   Prostate cancer  HPI: I was asked by Dr. Alinda Money to evaluate Thomas [name] for the evaluation of prostate cancer.  He is an 86 year old man with history of prostate cancer diagnosed in August 2011.  At that time he had Gleason score 3+3 = 6.  He has remained on active surveillance and had a repeat biopsy in January 2015 which showed no evidence of change in his pathology.  His PSA continued to rise and subsequently was started on androgen deprivation therapy.  His PSA was as low as 4.0 in November 2019.  Androgen deprivation therapy was withheld after the diagnosis of multiple myeloma and it was treated under the care of Dr. Julien Nordmann regarding this issue.  He received Velcade with Revlimid in February 2019 and achieved a complete response.  His PSA was as high as 31 in November 2020 and energy deprivation therapy was restarted.  His PSA did go down to 12 and in January 2022 was up to 19.7.  Based on these findings and PSMA scan was obtained and showed intense activity within the prostate gland without any evidence of metastatic disease.  Clinically, he is asymptomatic from his prostate cancer.  He denies any bone pain or pathological fracture.  Denies any urinary complaints.  Is not receiving any therapy for his multiple myeloma.  He does not report any headaches, blurry vision, syncope or seizures. Does not report any fevers, chills or sweats.  Does not report any cough, wheezing or hemoptysis.  Does not report any chest pain, palpitation, orthopnea or leg edema.  Does not report any nausea, vomiting or abdominal pain.  Does not report any constipation or diarrhea.  Does not report any skeletal complaints.    Does not report frequency, urgency or hematuria.  Does not report any skin rashes or lesions. Does not report any heat or cold intolerance.  Does not report any lymphadenopathy or petechiae.  Does not report any anxiety or depression.  Remaining review of systems is negative.     Past Medical History:  Diagnosis Date  . BPH (benign prostatic hypertrophy)   . Chronic diastolic heart failure ([place]) 12/27/2013  . Chronic kidney disease    Hx of bilateral renal cysts.  . Encounter for antineoplastic chemotherapy 02/20/2018  . Glaucoma   . Heart murmur   . History of atrial fibrillation 03/09/09  . Hx of epiglottitis    2011  . Hypercholesterolemia   . Hypertension   . Long term current use of amiodarone 11/07/2013   Amiodarone   . Mild aortic stenosis   . OSA on CPAP 06/26/2014  . Paroxysmal atrial fibrillation ([place]) 11/07/2013  . Prostate cancer ([place]) 7/12   T1C Ad Ca  s/p TURP, observation only  . Renal insufficiency 09/06/2011  . Sinus node dysfunction ([place]) 08/14/2014  . Sleep apnea   . Smoldering multiple myeloma ([place])   :  Past Surgical History:  Procedure Laterality Date  . ADRENALECTOMY  02/12/11   Left, for Pheochromocytoma  . CARDIOVERSION N/A 12/26/2013   Procedure: CARDIOVERSION;  Surgeon: [name], MD;  Location: [place];  Service: Cardiovascular;  Laterality: N/A;  . CHOLECYSTECTOMY  02/12/11  . GASTRECTOMY  1984   S/P gastrectomy for ulcer  . KNEE SURGERY  1971   S/P Right knee surgery  . TEE WITHOUT CARDIOVERSION N/A 12/26/2013   Procedure: TRANSESOPHAGEAL ECHOCARDIOGRAM (TEE);  Surgeon: [name], MD;  Location: [facility];  Service: Cardiovascular;  Laterality: N/A;  .  TRANSURETHRAL RESECTION OF PROSTATE  7/12  :   Current Outpatient Medications:  .  acetaminophen (TYLENOL) 500 MG tablet, Take 1,000 mg by mouth as needed. , Disp: , Rfl:  .  brimonidine-timolol (COMBIGAN) 0.2-0.5 % ophthalmic solution, Place 1 drop into both eyes every 12 (twelve) hours., Disp: , Rfl:  .  Cholecalciferol (VITAMIN D3) 50 MCG (2000 UT) capsule, Take 1 capsule by mouth daily. , Disp: , Rfl:  .  diltiazem (CARTIA XT) 120 MG 24 hr capsule, TAKE ONE CAPSULE BY MOUTH ONCE DAILY, Disp: 90 capsule, Rfl: 2 .  ELIQUIS 2.5 MG TABS tablet, TAKE ONE TABLET  BY MOUTH TWICE DAILY, Disp: 180 tablet, Rfl: 1 .  latanoprost (XALATAN) 0.005 % ophthalmic solution, INSTILL 1 DROP INTO BOTH EYES EVERY DAY AT NIGHT, Disp: , Rfl: 6:  Allergies  Allergen Reactions  . Benadryl [Diphenhydramine Hcl] Other (See Comments)    Bad dreams -  Hallucinations.  . Ceftriaxone Sodium Hives  . Contrast Media [Iodinated Diagnostic Agents] Other (See Comments)    Poor kidney function  . Nsaids Other (See Comments)    REACTION: BLEEDING ULCER  . Penicillins Hives  . Codeine     Other reaction(s): loopy  . Ibuprofen     Other reaction(s): hives  . Levaquin [Levofloxacin]   . Oxybutynin Other (See Comments)    Lightheaded, dizzy, unbalanced. "Disturbed my equilibrium."  . Tramadol Hcl     Other reaction(s): dizzy/nauseated  . Chlorhexidine Rash    Use alcohol for skin prep per patient request.   :  Family History  Problem Relation Age of Onset  . Prostate cancer Father   . Prostate cancer Brother   . Polymyositis Mother   . Liver disease Sister   . Liver disease Sister   . Liver disease Sister   . Melanoma Brother   . Kidney Stones Brother   :  Social History   Socioeconomic History  . Marital status: Married    Spouse name: Not on file  . Number of children: 3  . Years of education: Not on file  . Highest education level: Not on file  Occupational History    Employer: RETIRED  Tobacco Use  . Smoking status: Never Smoker  . Smokeless tobacco: Never Used  Vaping Use  . Vaping Use: Never used  Substance and Sexual Activity  . Alcohol use: No  . Drug use: No  . Sexual activity: Not Currently  Other Topics Concern  . Not on file  Social History Narrative  . Not on file   Social Determinants of Health   Financial Resource Strain: Not on file  Food Insecurity: Not on file  Transportation Needs: Not on file  Physical Activity: Not on file  Stress: Not on file  Social Connections: Not on file  Intimate Partner Violence: Not on file   :     NM PET (F18-PYLARIFY) SKULL TO MID THIGH  Addendum Date: 11/24/2020   ADDENDUM REPORT: 11/24/2020 13:39 ADDENDUM: The activity anterior midline int prostate gland is very intense (SUV max equal 19) favored to represent urine collecting within and anterior TURP defect. There is activity in the RIGHT lateral mid gland and LEFT apex which is also intense but to lesser degree (SUV max equal 6) which is favored prostate adenocarcinoma. Electronically Signed   By: Suzy Bouchard M.D.   On: 11/24/2020 13:39   Result Date: 11/24/2020 CLINICAL DATA:  Prostate carcinoma with biochemical recurrence. EXAM: NUCLEAR MEDICINE PET SKULL BASE TO THIGH  TECHNIQUE: 9.5 mCi F18 Piflufolastat (Pylarify) was injected intravenously. Full-ring PET imaging was performed from the skull base to thigh after the radiotracer. CT data was obtained and used for attenuation correction and anatomic localization. COMPARISON:  None. FINDINGS: NECK No radiotracer activity in neck lymph nodes. Incidental CT finding: None CHEST No radiotracer accumulation within mediastinal or hilar lymph nodes. No suspicious pulmonary nodules on the CT scan. Incidental CT finding: None ABDOMEN/PELVIS Prostate: Intense radiotracer activity anteriorly within the prostate gland as well within LEFT and RIGHT gland localizing to the peripheral zone. Activity intense with SUV max equal 19.2 Large TURP defect noted centrally within the prostate. Lymph nodes: No abnormal radiotracer accumulation within pelvic or abdominal nodes. Liver: No evidence of liver metastasis Incidental CT finding: Atherosclerotic calcification of the aorta. Bilateral renal cortical thinning. SKELETON No focal  activity to suggest skeletal metastasis. IMPRESSION: 1. Intense activity within the prostate gland consistent with primary prostate carcinoma. 2. No evidence of nodal metastasis in the pelvis or periaortic retroperitoneum. 3. No visceral metastasis or skeletal Electronically  Signed: By: Suzy Bouchard M.D. On: 11/10/2020 10:00    Assessment and Plan:   86 year old with:  1.  Prostate cancer diagnosed in 2011 with a Gleason score 3+3 = 6.  He developed a biochemical recurrence without any definitive treatment for his primary tumor.  He is currently on androgen deprivation therapy and developed castration-resistant disease with that scan showed persistent activity within the prostate and no additional metastatic disease.  This case was discussed the native prostate cancer multidisciplinary clinic and treatment options were reviewed.  His pathology results as well as imaging studies were reviewed with pathology and radiology respectively.  I am in favor of proceeding with definitive therapy with radiation to his localized disease.  Upon completing radiation therapy additional androgen deprivation therapy with therapy escalation would be recommended.  Adding abiraterone would be reasonable to his androgen deprivation for at least 2 years after that.  This could be extended further or additional systemic therapy may be needed if his PSA has persistent rise after that.   All his questions were answered at this time.   2.  The multiple myeloma diagnosed in 2019.  He was found to have IgG kappa and received 7 cycles of Velcade and Revlimid with a complete response.  45  minutes were dedicated to this visit. The time was spent on reviewing laboratory data, imaging studies, discussing treatment options, and answering questions regarding future plan.    A copy of this consult has been forwarded to the requesting physician.

## 2020-11-24 NOTE — Progress Notes (Signed)
Spoke with patient to confirm appointment for [facility] 2/22, arriving @ 12:30 pm. I reviewed location, valet parking, registration and COVID restrictions.

## 2020-11-24 NOTE — Addendum Note (Signed)
Addended by: Wyatt Portela on: 11/24/2020 02:24 PM   Modules accepted: Level of Service

## 2020-11-24 NOTE — Progress Notes (Signed)
[place] Psychosocial Distress Screening Spiritual Care  Met with Kainan and his wife in [facility] to introduce [facility] team/resources, reviewing distress screen per protocol.  The patient scored a 0 on the Psychosocial Distress Thermometer which indicates minimal distress. Also assessed for distress and other psychosocial needs.   ONCBCN DISTRESS SCREENING 11/24/2020  Screening Type Initial Screening  Distress experienced in past week (1-10) 0  Physical Problem type Tingling hands/feet  Referral to support programs Yes   Mr Broom reports good support from his wife, their faith, and their [employer] in [place].  Follow up needed: Yes.  Mr Acuna welcomes pastoral follow-up call after his first radiation treatment. He also knows how to contact chaplain as needed in the meantime.   [place], [place], [employer] Pager [phone] Voicemail [phone]

## 2020-11-24 NOTE — Progress Notes (Signed)
                               Care Plan Summary  Name: Thomas [name] DOB: Jun 27, 1935   Your Medical Team:   Urologist -  Dr. Raynelle Bring, [facility]  Radiation Oncologist - Dr. Tyler Pita, [facility]   Medical Oncologist - Dr. Zola Button, [place]  Recommendations: 1) Radiation     * These recommendations are based on information available as of today's consult.      Recommendations may change depending on the results of further tests or exams.  Next Steps: 1) Dr. Alinda Money will schedule gold markers   When appointments need to be scheduled, you will be contacted by [facility] and/or [facility].  Questions?  Please do not hesitate to call Cira Rue, RN, BSN, OCN at [phone] any questions or concerns.  Shirlean Mylar is your Oncology Nurse Navigator and is available to assist you while you're receiving your medical care at [facility].

## 2020-11-24 NOTE — Progress Notes (Signed)
Radiation Oncology         [phone] ________________________________  Multidisciplinary Prostate Cancer Clinic  Initial Radiation Oncology Consultation  Name: KALEM ROCKWELL MRN: [id number]  Date: 11/24/2020  DOB: [birth year]  CC:[name], MD  Raynelle Bring, MD   REFERRING PHYSICIAN: Raynelle Bring, MD  DIAGNOSIS: 86 y.o. 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    ICD-10-CM   1. Malignant neoplasm of prostate ([place])  C61     HISTORY OF PRESENT ILLNESS::Thomas [name] is a 86 y.o. gentleman. He was initially diagnosed with Gleason 3+3 prostate cancer in 05/2010 by Dr. Risa Grill with a PSA of 6.1. He appropriately opted for active surveillance. Surveillance biopsies in 04/2011 and 10/2013 showed disease stability with Gleason 3+3 disease. On repeat surveillance biopsy in 12/2015 his disease was upstaged to Gleason 3+4 prostate cancer with a PSA of 14.34. His PSA continued to increase, up to 23.1 in 08/2017, and he was started on ADT at that time. A bone scan performed in 09/2017 was negative. His PSA became undetectable, so the ADT was stopped in 01/2018.  His PSA again rose, to 19.7 in 05/2019. He underwent CT A/P and restaging bone scan on 08/19/2019 showing no measurable disease, with the exception of questionable rib lesions. A repeat PSA in late 08/2019 showed an increase to 31.3, so he resumed ADT. This time, his PSA reached a nadir of 12 before beginning to rise again despite therapy.  His PSA reached 19.7 in 10/2020, prompting a PSMA scan which was performed on 11/09/20 and showed intense activity within the prostate gland with very intense activity at the anterior midline, favored to represent urine collecting within the anterior TURP defect bur activity in the right lateral mid gland and left apex favored prostate adenocarcinoma.  There was no evidence of nodal metastasis in the abdomen or pelvis and no evidence of visceral or skeletal  metastasis.  The patient reviewed the PSMA scan results with his urologist and he has kindly been referred today to the multidisciplinary prostate cancer clinic for presentation of pathology and radiology studies in our conference for discussion of potential radiation treatment options and clinical evaluation.  PREVIOUS RADIATION THERAPY: No  PAST MEDICAL HISTORY:  has a past medical history of BPH (benign prostatic hypertrophy), Chronic diastolic heart failure ([place]) (12/27/2013), Chronic kidney disease, Encounter for antineoplastic chemotherapy (02/20/2018), Glaucoma, Heart murmur, History of atrial fibrillation (03/09/09), epiglottitis, Hypercholesterolemia, Hypertension, Long term current use of amiodarone (11/07/2013), Mild aortic stenosis, OSA on CPAP (06/26/2014), Paroxysmal atrial fibrillation ([place]) (11/07/2013), Prostate cancer ([place]) (7/12), Renal insufficiency (09/06/2011), Sinus node dysfunction ([place]) (08/14/2014), Sleep apnea, and Smoldering multiple myeloma ([place]).    PAST SURGICAL HISTORY: Past Surgical History:  Procedure Laterality Date   ADRENALECTOMY  02/12/11   Left, for Pheochromocytoma   CARDIOVERSION N/A 12/26/2013   Procedure: CARDIOVERSION;  Surgeon: [name], MD;  Location: [facility];  Service: Cardiovascular;  Laterality: N/A;   CHOLECYSTECTOMY  02/12/11   GASTRECTOMY  1984   S/P gastrectomy for ulcer   KNEE SURGERY  1971   S/P Right knee surgery   TEE WITHOUT CARDIOVERSION N/A 12/26/2013   Procedure: TRANSESOPHAGEAL ECHOCARDIOGRAM (TEE);  Surgeon: [name], MD;  Location: [facility] ENDOSCOPY;  Service: Cardiovascular;  Laterality: N/A;   TRANSURETHRAL RESECTION OF PROSTATE  7/12    FAMILY HISTORY: family history includes Kidney Stones in his brother; Liver disease in his sister, sister, and sister; Melanoma in his brother; Polymyositis in his mother;  Prostate cancer in his brother and father.  SOCIAL HISTORY:  reports that he has never smoked. He has never used  smokeless tobacco. He reports that he does not drink alcohol and does not use drugs.  ALLERGIES: Benadryl [diphenhydramine hcl], Ceftriaxone sodium, Contrast media [iodinated diagnostic agents], Nsaids, Penicillins, Codeine, Ibuprofen, Levaquin [levofloxacin], Oxybutynin, Tramadol hcl, and Chlorhexidine  MEDICATIONS:  Current Outpatient Medications  Medication Sig Dispense Refill   acetaminophen (TYLENOL) 500 MG tablet Take 1,000 mg by mouth as needed.      brimonidine-timolol (COMBIGAN) 0.2-0.5 % ophthalmic solution Place 1 drop into both eyes every 12 (twelve) hours.     Cholecalciferol (VITAMIN D3) 50 MCG (2000 UT) capsule Take 1 capsule by mouth daily.      diltiazem (CARTIA XT) 120 MG 24 hr capsule TAKE ONE CAPSULE BY MOUTH ONCE DAILY 90 capsule 2   ELIQUIS 2.5 MG TABS tablet TAKE ONE TABLET BY MOUTH TWICE DAILY 180 tablet 1   latanoprost (XALATAN) 0.005 % ophthalmic solution INSTILL 1 DROP INTO BOTH EYES EVERY DAY AT NIGHT  6   No current facility-administered medications for this encounter.    REVIEW OF SYSTEMS:  On review of systems, the patient reports that he is doing well overall. He denies any chest pain, shortness of breath, cough, fevers, chills, night sweats, unintended weight changes. He denies any bowel disturbances, and denies abdominal pain, nausea or vomiting. He denies any new musculoskeletal or joint aches or pains. His IPSS was 22, indicating severe urinary symptoms. His SHIM was 0, indicating he has severe erectile dysfunction. A complete review of systems is obtained and is otherwise negative.   PHYSICAL EXAM:  Wt Readings from Last 3 Encounters:  11/24/20 179 lb 6.4 oz (81.4 kg)  10/01/20 173 lb 9.6 oz (78.7 kg)  09/17/20 171 lb 11.2 oz (77.9 kg)   Temp Readings from Last 3 Encounters:  11/24/20 (!) 97.4 F (36.3 C)  09/17/20 (!) 96.1 F (35.6 C) (Tympanic)  05/20/20 98.5 F (36.9 C) (Tympanic)   BP Readings from Last 3 Encounters:  11/24/20 (!)  151/78  10/01/20 140/72  09/17/20 (!) 153/103   Pulse Readings from Last 3 Encounters:  11/24/20 78  10/01/20 88  09/17/20 95   Pain Assessment Pain Score: 0-No pain/10  In general this is a well appearing Caucasian male in no acute distress. He's alert and oriented x4 and appropriate throughout the examination. Cardiopulmonary assessment is negative for acute distress and he exhibits normal effort.    KPS = 90  100 - Normal; no complaints; no evidence of disease. 90   - Able to carry on normal activity; minor signs or symptoms of disease. 80   - Normal activity with effort; some signs or symptoms of disease. 82   - Cares for self; unable to carry on normal activity or to do active work. 60   - Requires occasional assistance, but is able to care for most of his personal needs. 50   - Requires considerable assistance and frequent medical care. 55   - Disabled; requires special care and assistance. 35   - Severely disabled; hospital admission is indicated although death not imminent. 57   - Very sick; hospital admission necessary; active supportive treatment necessary. 10   - Moribund; fatal processes progressing rapidly. 0     - Dead  Karnofsky DA, Abelmann WH, Craver LS and Burchenal [facility] [phone]) The use of the nitrogen mustards in the palliative treatment of carcinoma: with particular reference to bronchogenic  carcinoma Cancer 1 634-56   LABORATORY DATA:  Lab Results  Component Value Date   WBC 6.8 09/10/2020   HGB 16.6 09/10/2020   HCT 48.6 09/10/2020   MCV 95.7 09/10/2020   PLT 187 09/10/2020   Lab Results  Component Value Date   NA 140 09/10/2020   K 4.1 09/10/2020   CL 109 09/10/2020   CO2 22 09/10/2020   Lab Results  Component Value Date   ALT 15 09/10/2020   AST 19 09/10/2020   ALKPHOS 99 09/10/2020   BILITOT 0.9 09/10/2020     RADIOGRAPHY: MR BRAIN WO CONTRAST  Result Date: 11/06/2020 CLINICAL DATA:  TIA.  Dizziness. EXAM: MRI HEAD WITHOUT CONTRAST  TECHNIQUE: Multiplanar, multiecho pulse sequences of the brain and surrounding structures were obtained without intravenous contrast. COMPARISON:  None. FINDINGS: Brain: There is no evidence of an acute infarct, intracranial hemorrhage, mass, midline shift, or extra-axial fluid collection. Scattered small foci of T2 hyperintensity in the cerebral white matter bilaterally are nonspecific but compatible with mild chronic small vessel ischemic disease. There is a subcentimeter chronic infarct posteriorly in the right cerebellar hemisphere. There is mild generalized cerebral atrophy. Vascular: Major intracranial vascular flow voids are preserved. Skull and upper cervical spine: Unremarkable bone marrow signal. Partial coverage of moderate to severe cervical facet arthrosis. Sinuses/Orbits: Bilateral cataract extraction Paranasal sinuses and mastoid air cells are clear. Other: None. IMPRESSION: 1. No acute intracranial abnormality. 2. Mild chronic small vessel ischemic disease. 3. Small chronic right cerebellar infarct. Electronically Signed   By: Logan Bores M.D.   On: 11/06/2020 17:35   NM PET (F18-PYLARIFY) SKULL TO MID THIGH  Addendum Date: 11/24/2020   ADDENDUM REPORT: 11/24/2020 13:39 ADDENDUM: The activity anterior midline int prostate gland is very intense (SUV max equal 19) favored to represent urine collecting within and anterior TURP defect. There is activity in the RIGHT lateral mid gland and LEFT apex which is also intense but to lesser degree (SUV max equal 6) which is favored prostate adenocarcinoma. Electronically Signed   By: Suzy Bouchard M.D.   On: 11/24/2020 13:39   Result Date: 11/24/2020 CLINICAL DATA:  Prostate carcinoma with biochemical recurrence. EXAM: NUCLEAR MEDICINE PET SKULL BASE TO THIGH TECHNIQUE: 9.5 mCi F18 Piflufolastat (Pylarify) was injected intravenously. Full-ring PET imaging was performed from the skull base to thigh after the radiotracer. CT data was obtained and used  for attenuation correction and anatomic localization. COMPARISON:  None. FINDINGS: NECK No radiotracer activity in neck lymph nodes. Incidental CT finding: None CHEST No radiotracer accumulation within mediastinal or hilar lymph nodes. No suspicious pulmonary nodules on the CT scan. Incidental CT finding: None ABDOMEN/PELVIS Prostate: Intense radiotracer activity anteriorly within the prostate gland as well within LEFT and RIGHT gland localizing to the peripheral zone. Activity intense with SUV max equal 19.2 Large TURP defect noted centrally within the prostate. Lymph nodes: No abnormal radiotracer accumulation within pelvic or abdominal nodes. Liver: No evidence of liver metastasis Incidental CT finding: Atherosclerotic calcification of the aorta. Bilateral renal cortical thinning. SKELETON No focal  activity to suggest skeletal metastasis. IMPRESSION: 1. Intense activity within the prostate gland consistent with primary prostate carcinoma. 2. No evidence of nodal metastasis in the pelvis or periaortic retroperitoneum. 3. No visceral metastasis or skeletal Electronically Signed: By: Suzy Bouchard M.D. On: 11/10/2020 10:00      IMPRESSION/PLAN: 86 y.o. 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  We discussed the patient's workup and outlined the nature of  prostate cancer in this setting.  Although he now has castrate resistant disease, he has never had definitive treatment.  Accordingly, he is eligible for on 8 weeks of external radiation concurrent with ADT +/- Zytiga. He is already on ADT at this time with his most recent 84-monthEligard injection given on 10/23/2020. We discussed the available radiation techniques, and focused on the details and logistics of delivery. We discussed and outlined the risks, benefits, short and long-term effects associated with radiotherapy. We discussed the role of SpaceOAR gel in reducing the rectal toxicity associated with  radiotherapy.  He was encouraged to ask questions that were answered to his stated satisfaction.  At the end of the conversation, the patient is interested in moving forward with 8 weeks of external beam therapy concurrent with ADT. He will continue on ADT under the care and direction of Dr. BAlinda Money We will make arrangements for fiducial markers and SpaceOAR gel placement, first available, prior to simulation, to reduce rectal toxicity from radiotherapy. He will also meet with Dr. SAlen Blewregarding the recommendation for Zytiga. The patient appears to have a good understanding of his disease and our treatment recommendations which are of curative intent and is in agreement with the stated plan.  Therefore, we will move forward with treatment planning accordingly, in anticipation of beginning IMRT in the near future.  I spend 60 min total in this encounter - M[place] PA-C    MTyler Pita MD  [place] [phone]  Fax: [email or web address]   Skype   LinkedIn   This document serves as a record of services personally performed by MTyler Pita MD and AFreeman Caldron PA-C. It was created on their behalf by KWilburn Mylar a trained medical scribe. The creation of this record is based on the scribe's personal observations and the provider's statements to them. This document has been checked and approved by the attending provider.

## 2020-11-30 ENCOUNTER — Telehealth: Payer: Self-pay | Admitting: *Deleted

## 2020-11-30 DIAGNOSIS — N183 Chronic kidney disease, stage 3 unspecified: Secondary | ICD-10-CM | POA: Diagnosis not present

## 2020-11-30 DIAGNOSIS — E032 Hypothyroidism due to medicaments and other exogenous substances: Secondary | ICD-10-CM | POA: Diagnosis not present

## 2020-11-30 DIAGNOSIS — E785 Hyperlipidemia, unspecified: Secondary | ICD-10-CM | POA: Diagnosis not present

## 2020-11-30 DIAGNOSIS — G459 Transient cerebral ischemic attack, unspecified: Secondary | ICD-10-CM | POA: Diagnosis not present

## 2020-11-30 DIAGNOSIS — I129 Hypertensive chronic kidney disease with stage 1 through stage 4 chronic kidney disease, or unspecified chronic kidney disease: Secondary | ICD-10-CM | POA: Diagnosis not present

## 2020-11-30 DIAGNOSIS — I480 Paroxysmal atrial fibrillation: Secondary | ICD-10-CM | POA: Diagnosis not present

## 2020-11-30 DIAGNOSIS — M1711 Unilateral primary osteoarthritis, right knee: Secondary | ICD-10-CM | POA: Diagnosis not present

## 2020-11-30 NOTE — Telephone Encounter (Signed)
CALLED PATIENT TO INFORM OF APPT. @ [facility] ON 01-07-21 FOR FID. MARKERS AND SPACE OAR GEL AND HIS SIM WILL BE ON 01-19-21 - ARRIVAL TIME- 10:15 AM @ DR. MANNING'S OFFICE , SPOKE WITH PATIENT AND HE IS AWARE OF THESE APPTS.

## 2020-12-01 ENCOUNTER — Encounter: Payer: Self-pay | Admitting: Medical Oncology

## 2020-12-10 ENCOUNTER — Inpatient Hospital Stay: Payer: Medicare Other | Attending: Internal Medicine

## 2020-12-10 ENCOUNTER — Other Ambulatory Visit: Payer: Self-pay

## 2020-12-10 DIAGNOSIS — Z881 Allergy status to other antibiotic agents status: Secondary | ICD-10-CM | POA: Diagnosis not present

## 2020-12-10 DIAGNOSIS — G4733 Obstructive sleep apnea (adult) (pediatric): Secondary | ICD-10-CM | POA: Insufficient documentation

## 2020-12-10 DIAGNOSIS — C61 Malignant neoplasm of prostate: Secondary | ICD-10-CM | POA: Insufficient documentation

## 2020-12-10 DIAGNOSIS — Z885 Allergy status to narcotic agent status: Secondary | ICD-10-CM | POA: Diagnosis not present

## 2020-12-10 DIAGNOSIS — Z880 Allergy status to penicillin: Secondary | ICD-10-CM | POA: Diagnosis not present

## 2020-12-10 DIAGNOSIS — Z79899 Other long term (current) drug therapy: Secondary | ICD-10-CM | POA: Diagnosis not present

## 2020-12-10 DIAGNOSIS — Z888 Allergy status to other drugs, medicaments and biological substances status: Secondary | ICD-10-CM | POA: Insufficient documentation

## 2020-12-10 DIAGNOSIS — I5032 Chronic diastolic (congestive) heart failure: Secondary | ICD-10-CM | POA: Diagnosis not present

## 2020-12-10 DIAGNOSIS — Z7901 Long term (current) use of anticoagulants: Secondary | ICD-10-CM | POA: Insufficient documentation

## 2020-12-10 DIAGNOSIS — Z9049 Acquired absence of other specified parts of digestive tract: Secondary | ICD-10-CM | POA: Insufficient documentation

## 2020-12-10 DIAGNOSIS — C9000 Multiple myeloma not having achieved remission: Secondary | ICD-10-CM | POA: Diagnosis not present

## 2020-12-10 DIAGNOSIS — M2550 Pain in unspecified joint: Secondary | ICD-10-CM | POA: Insufficient documentation

## 2020-12-10 DIAGNOSIS — Z9079 Acquired absence of other genital organ(s): Secondary | ICD-10-CM | POA: Insufficient documentation

## 2020-12-10 DIAGNOSIS — R5383 Other fatigue: Secondary | ICD-10-CM | POA: Insufficient documentation

## 2020-12-10 DIAGNOSIS — Z886 Allergy status to analgesic agent status: Secondary | ICD-10-CM | POA: Diagnosis not present

## 2020-12-10 DIAGNOSIS — E7800 Pure hypercholesterolemia, unspecified: Secondary | ICD-10-CM | POA: Insufficient documentation

## 2020-12-10 DIAGNOSIS — N189 Chronic kidney disease, unspecified: Secondary | ICD-10-CM | POA: Insufficient documentation

## 2020-12-10 LAB — CBC WITH DIFFERENTIAL (CANCER CENTER ONLY)
Abs Immature Granulocytes: 0.03 10*3/uL (ref 0.00–0.07)
Basophils Absolute: 0 10*3/uL (ref 0.0–0.1)
Basophils Relative: 0 %
Eosinophils Absolute: 0.1 10*3/uL (ref 0.0–0.5)
Eosinophils Relative: 1 %
HCT: 49 % (ref 39.0–52.0)
Hemoglobin: 16.8 g/dL (ref 13.0–17.0)
Immature Granulocytes: 1 %
Lymphocytes Relative: 28 %
Lymphs Abs: 1.6 10*3/uL (ref 0.7–4.0)
MCH: 32 pg (ref 26.0–34.0)
MCHC: 34.3 g/dL (ref 30.0–36.0)
MCV: 93.3 fL (ref 80.0–100.0)
Monocytes Absolute: 0.6 10*3/uL (ref 0.1–1.0)
Monocytes Relative: 9 %
Neutro Abs: 3.5 10*3/uL (ref 1.7–7.7)
Neutrophils Relative %: 61 %
Platelet Count: 215 10*3/uL (ref 150–400)
RBC: 5.25 MIL/uL (ref 4.22–5.81)
RDW: 13.6 % (ref 11.5–15.5)
WBC Count: 5.8 10*3/uL (ref 4.0–10.5)
nRBC: 0 % (ref 0.0–0.2)

## 2020-12-10 LAB — CMP (CANCER CENTER ONLY)
ALT: 17 U/L (ref 0–44)
AST: 20 U/L (ref 15–41)
Albumin: 4 g/dL (ref 3.5–5.0)
Alkaline Phosphatase: 118 U/L (ref 38–126)
Anion gap: 8 (ref 5–15)
BUN: 19 mg/dL (ref 8–23)
CO2: 25 mmol/L (ref 22–32)
Calcium: 10.1 mg/dL (ref 8.9–10.3)
Chloride: 108 mmol/L (ref 98–111)
Creatinine: 1.54 mg/dL — ABNORMAL HIGH (ref 0.61–1.24)
GFR, Estimated: 44 mL/min — ABNORMAL LOW (ref >60)
Glucose, Bld: 98 mg/dL (ref 70–99)
Potassium: 4.1 mmol/L (ref 3.5–5.1)
Sodium: 141 mmol/L (ref 135–145)
Total Bilirubin: 0.8 mg/dL (ref 0.3–1.2)
Total Protein: 6.8 g/dL (ref 6.5–8.1)

## 2020-12-10 LAB — LACTATE DEHYDROGENASE: LDH: 180 U/L (ref 98–192)

## 2020-12-11 ENCOUNTER — Encounter: Payer: Self-pay | Admitting: General Practice

## 2020-12-11 LAB — IGG, IGA, IGM
IgA: 69 mg/dL (ref 61–437)
IgG (Immunoglobin G), Serum: 922 mg/dL (ref 603–1613)
IgM (Immunoglobulin M), Srm: 38 mg/dL (ref 15–143)

## 2020-12-11 LAB — KAPPA/LAMBDA LIGHT CHAINS
Kappa free light chain: 46.4 mg/L — ABNORMAL HIGH (ref 3.3–19.4)
Kappa, lambda light chain ratio: 3.65 — ABNORMAL HIGH (ref 0.26–1.65)
Lambda free light chains: 12.7 mg/L (ref 5.7–26.3)

## 2020-12-11 LAB — BETA 2 MICROGLOBULIN, SERUM: Beta-2 Microglobulin: 2.8 mg/L — ABNORMAL HIGH (ref 0.6–2.4)

## 2020-12-11 NOTE — Progress Notes (Signed)
[facility] Spiritual Care Note  Attempted Prostate Multidisciplinary Clinic follow-up call, leaving voicemail of care and support with encouragement to return call.   [place], [place], [facility] Pager [phone] Voicemail [phone]

## 2020-12-17 ENCOUNTER — Other Ambulatory Visit: Payer: Self-pay

## 2020-12-17 ENCOUNTER — Telehealth: Payer: Self-pay | Admitting: Interventional Cardiology

## 2020-12-17 ENCOUNTER — Inpatient Hospital Stay: Payer: Medicare Other | Admitting: Internal Medicine

## 2020-12-17 VITALS — BP 128/83 | HR 83 | Temp 97.8°F | Resp 17 | Ht 65.0 in | Wt 175.4 lb

## 2020-12-17 DIAGNOSIS — Z9049 Acquired absence of other specified parts of digestive tract: Secondary | ICD-10-CM | POA: Diagnosis not present

## 2020-12-17 DIAGNOSIS — G4733 Obstructive sleep apnea (adult) (pediatric): Secondary | ICD-10-CM | POA: Diagnosis not present

## 2020-12-17 DIAGNOSIS — Z79899 Other long term (current) drug therapy: Secondary | ICD-10-CM | POA: Diagnosis not present

## 2020-12-17 DIAGNOSIS — E7800 Pure hypercholesterolemia, unspecified: Secondary | ICD-10-CM | POA: Diagnosis not present

## 2020-12-17 DIAGNOSIS — C61 Malignant neoplasm of prostate: Secondary | ICD-10-CM | POA: Diagnosis not present

## 2020-12-17 DIAGNOSIS — C9000 Multiple myeloma not having achieved remission: Secondary | ICD-10-CM

## 2020-12-17 DIAGNOSIS — I5032 Chronic diastolic (congestive) heart failure: Secondary | ICD-10-CM | POA: Diagnosis not present

## 2020-12-17 DIAGNOSIS — M2550 Pain in unspecified joint: Secondary | ICD-10-CM | POA: Diagnosis not present

## 2020-12-17 DIAGNOSIS — Z886 Allergy status to analgesic agent status: Secondary | ICD-10-CM | POA: Diagnosis not present

## 2020-12-17 DIAGNOSIS — R5383 Other fatigue: Secondary | ICD-10-CM | POA: Diagnosis not present

## 2020-12-17 DIAGNOSIS — N189 Chronic kidney disease, unspecified: Secondary | ICD-10-CM | POA: Diagnosis not present

## 2020-12-17 DIAGNOSIS — Z888 Allergy status to other drugs, medicaments and biological substances status: Secondary | ICD-10-CM | POA: Diagnosis not present

## 2020-12-17 DIAGNOSIS — Z880 Allergy status to penicillin: Secondary | ICD-10-CM | POA: Diagnosis not present

## 2020-12-17 DIAGNOSIS — Z885 Allergy status to narcotic agent status: Secondary | ICD-10-CM | POA: Diagnosis not present

## 2020-12-17 DIAGNOSIS — Z7901 Long term (current) use of anticoagulants: Secondary | ICD-10-CM | POA: Diagnosis not present

## 2020-12-17 DIAGNOSIS — Z881 Allergy status to other antibiotic agents status: Secondary | ICD-10-CM | POA: Diagnosis not present

## 2020-12-17 NOTE — Progress Notes (Signed)
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 2) History of pheochromocytoma LEFT adrenal gland status post resection on 03/16/2009 3) Prostate adenocarcinoma  PRIOR THERAPY: Systemic therapy with weekly Velcade 1.3 mg/KG subcutaneously, Revlimid 25 mg p.o. daily for 21 days every 4 weeks as well as Decadron 20 mg p.o. weekly.  First dose November 07, 2017.  Status post 7 cycles.  CURRENT THERAPY: Observation.  INTERVAL HISTORY: Thomas [name] 86 y.o. male returns to the clinic today for follow-up visit.  The patient is feeling fine today with no concerning complaints.  He was seen recently by Dr. Alen Blew and Dr. Tammi Klippel for management of his prostate adenocarcinoma and expected to undergo curative radiotherapy soon.  He denied having any current chest pain, shortness of breath, cough or hemoptysis.  He denied having any fever or chills.  He has no nausea, vomiting, diarrhea or constipation.  He has no headache or visual changes.  The patient had repeat myeloma panel performed recently and is here for evaluation and discussion of his lab results.  MEDICAL HISTORY: Past Medical History:  Diagnosis Date  . BPH (benign prostatic hypertrophy)   . Chronic diastolic heart failure ([place]) 12/27/2013  . Chronic kidney disease    Hx of bilateral renal cysts.  . Encounter for antineoplastic chemotherapy 02/20/2018  . Glaucoma   . Heart murmur   . History of atrial fibrillation 03/09/09  . Hx of epiglottitis    2011  . Hypercholesterolemia   . Hypertension   . Long term current use of amiodarone 11/07/2013   Amiodarone   . Mild aortic stenosis   . OSA on CPAP 06/26/2014  . Paroxysmal atrial fibrillation ([place]) 11/07/2013  . Prostate cancer ([place])  7/12   T1C Ad Ca  s/p TURP, observation only  . Renal insufficiency 09/06/2011  . Sinus node dysfunction ([place]) 08/14/2014  . Sleep apnea   . Smoldering multiple myeloma ([facility])     ALLERGIES:  is allergic to benadryl [diphenhydramine hcl], ceftriaxone sodium, contrast media [iodinated diagnostic agents], nsaids, penicillins, codeine, ibuprofen, levaquin [levofloxacin], oxybutynin, tramadol hcl, and chlorhexidine.  MEDICATIONS:  Current Outpatient Medications  Medication Sig Dispense Refill  . acetaminophen (TYLENOL) 500 MG tablet Take 1,000 mg by mouth as needed.     . brimonidine-timolol (COMBIGAN) 0.2-0.5 % ophthalmic solution Place 1 drop into both eyes every 12 (twelve) hours.    . Cholecalciferol (VITAMIN D3) 50 MCG (2000 UT) capsule Take 1 capsule by mouth daily.     Marland Kitchen diltiazem (CARTIA XT) 120 MG 24 hr capsule TAKE ONE CAPSULE BY MOUTH ONCE DAILY 90 capsule 2  . ELIQUIS 2.5 MG TABS tablet TAKE ONE TABLET BY MOUTH TWICE DAILY 180 tablet 1  . latanoprost (XALATAN) 0.005 % ophthalmic solution INSTILL 1 DROP INTO BOTH EYES EVERY DAY AT NIGHT  6   No current facility-administered medications for this visit.    SURGICAL HISTORY:  Past Surgical History:  Procedure Laterality Date  . ADRENALECTOMY  02/12/11   Left, for Pheochromocytoma  . CARDIOVERSION N/A 12/26/2013   Procedure: CARDIOVERSION;  Surgeon: [name], MD;  Location: [facility];  Service: Cardiovascular;  Laterality: N/A;  . CHOLECYSTECTOMY  02/12/11  . GASTRECTOMY  1984   S/P gastrectomy for ulcer  .  KNEE SURGERY  1971   S/P Right knee surgery  . TEE WITHOUT CARDIOVERSION N/A 12/26/2013   Procedure: TRANSESOPHAGEAL ECHOCARDIOGRAM (TEE);  Surgeon: [name], MD;  Location: [place];  Service: Cardiovascular;  Laterality: N/A;  . TRANSURETHRAL RESECTION OF PROSTATE  7/12    REVIEW OF SYSTEMS:  A comprehensive review of systems was negative except for: Constitutional: positive for fatigue Musculoskeletal:  positive for arthralgias   PHYSICAL EXAMINATION: General appearance: alert, cooperative and no distress Head: Normocephalic, without obvious abnormality, atraumatic Neck: no adenopathy, no JVD, supple, symmetrical, trachea midline and thyroid not enlarged, symmetric, no tenderness/mass/nodules Lymph nodes: Cervical, supraclavicular, and axillary nodes normal. Resp: clear to auscultation bilaterally Back: symmetric, no curvature. ROM normal. No CVA tenderness. Cardio: regular rate and rhythm, S1, S2 normal, no murmur, click, rub or gallop GI: soft, non-tender; bowel sounds normal; no masses,  no organomegaly Extremities: extremities normal, atraumatic, no cyanosis or edema  ECOG PERFORMANCE STATUS: 1 - Symptomatic but completely ambulatory  Blood pressure 128/83, pulse 83, temperature 97.8 F (36.6 C), temperature source Tympanic, resp. rate 17, height 5' 5"  (1.651 m), weight 175 lb 6.4 oz (79.6 kg), SpO2 98 %.  LABORATORY DATA: Lab Results  Component Value Date   WBC 5.8 12/10/2020   HGB 16.8 12/10/2020   HCT 49.0 12/10/2020   MCV 93.3 12/10/2020   PLT 215 12/10/2020      Chemistry      Component Value Date/Time   NA 141 12/10/2020 0957   NA 138 08/16/2017 1404   K 4.1 12/10/2020 0957   K 4.5 08/16/2017 1404   CL 108 12/10/2020 0957   CL 109 (H) 03/21/2013 0958   CO2 25 12/10/2020 0957   CO2 24 08/16/2017 1404   BUN 19 12/10/2020 0957   BUN 17.3 08/16/2017 1404   CREATININE 1.54 (H) 12/10/2020 0957   CREATININE 1.8 (H) 08/16/2017 1404      Component Value Date/Time   CALCIUM 10.1 12/10/2020 0957   CALCIUM 9.9 08/16/2017 1404   ALKPHOS 118 12/10/2020 0957   ALKPHOS 86 08/16/2017 1404   AST 20 12/10/2020 0957   AST 22 08/16/2017 1404   ALT 17 12/10/2020 0957   ALT 16 08/16/2017 1404   BILITOT 0.8 12/10/2020 0957   BILITOT 0.78 08/16/2017 1404       RADIOGRAPHIC STUDIES: No results found.  ASSESSMENT AND PLAN:  This is a very pleasant 86 years old white male  with IgG smoldering [place] diagnosed in April 2011. The patient has been on observation since 2011. His recent myeloma panel showed further increase in the free kappa light chain. His recent skeletal bone survey showed no concerning findings for disease progression.  He has a focus of avascular necrosis but no significant lytic lesions. His bone marrow bone marrow biopsy and aspirate showed further increase in the plasma cells to 15%. The patient underwent treatment with subcutaneous weekly Velcade as well as Revlimid and Decadron status post 7 cycles. The patient tolerated his previous treatment well.  He has been in observation now for few years with no concerning evidence for progression. He had repeat myeloma panel performed recently.  I discussed the lab results with the patient and there is no concerning findings for progression. I recommended for him to continue on observation with repeat myeloma panel in 6 months. For the recently diagnosed prostate adenocarcinoma, he is followed by Dr. Alen Blew and Dr. Tammi Klippel for consideration of radiotherapy. The patient was advised to call immediately if he has  any concerning symptoms in the interval. The patient voices understanding of current disease status and treatment options and is in agreement with the current care plan. All questions were answered. The patient knows to call the clinic with any problems, questions or concerns. We can certainly see the patient much sooner if necessary.  Disclaimer: This note was dictated with voice recognition software. Similar sounding words can inadvertently be transcribed and may not be corrected upon review.

## 2020-12-17 NOTE — Telephone Encounter (Signed)
   [facility] Pre-operative Risk Assessment    Request for surgical clearance:  1. What type of surgery is being performed? SpaceOAR Hydrogel Fiducial Marker  2. When is this surgery scheduled? 01/07/21 at 2:30 PM   3. What type of clearance is required (medical clearance vs. Pharmacy clearance to hold med vs. Both)? Both   4. Are there any medications that need to be held prior to surgery and how long? Eliquis, 3 days prior   5. Practice name and name of physician performing surgery? [facility]  Dr. Alinda Money   6. What is your office phone number? [phone]   7.   What is your office fax number? [phone]  8.   Anesthesia type (None, local, MAC, general) ? Nitrous NPO 4 hours   Zara Council 12/17/2020, 4:53 PM  _________________________________________________________________

## 2020-12-18 NOTE — Telephone Encounter (Signed)
Patient with diagnosis of afib on Eliquis for anticoagulation.    Procedure: SpaceOAR Hydrogel Fiducial Marker Date of procedure: 01/07/21  CHA2DS2-VASc Score = 4  This indicates a 4.8% annual risk of stroke. The patient's score is based upon: CHF History: Yes HTN History: Yes Diabetes History: No Stroke History: No Vascular Disease History: No Age Score: 2 Gender Score: 0      CrCl 31.6 ml/min Platelet count 215  Patient has TIA listed on his problem list. MRI of brain from 11/06/20 showed small chronic right cerebellar infarct. I will ask Dr. Tamala Julian to weigh in on the significance of this.

## 2020-12-18 NOTE — Telephone Encounter (Signed)
Okay to hold Eliquis

## 2020-12-18 NOTE — Telephone Encounter (Signed)
Ok to hold Elqiuis 3 days prior to procedure per Dr.Smith.

## 2020-12-21 NOTE — Telephone Encounter (Signed)
   Primary Cardiologist: [name], MD  Chart reviewed as part of pre-operative protocol coverage. Patient was contacted 12/21/2020 in reference to pre-operative risk assessment for pending surgery as outlined below.  Thomas [name] was last seen on 10/01/20 by Dr. Tamala Julian and was doing well. Cardiac history includs PAF, dCHF, TIA/cerebellar infarct by MRI, HTN, recurrent GU bleeding, aortic stenosis. Last echo 02/2020 EF 60-65%, mild LVH, mildly dilated aortic root, moderate AS, moderate AI, mild MR, mild TR.  Called both numbers, both go to VM. LMTCB.  Charlie Pitter, PA-C 12/21/2020, 11:01 AM

## 2020-12-21 NOTE — Telephone Encounter (Signed)
   Primary Cardiologist: [name], MD  Chart reviewed as part of pre-operative protocol coverage. Was able to reach patient. I reached out to patient for update on how he is doing. The patient affirms he has been doing well without any new cardiac symptoms. Therefore, based on ACC/AHA guidelines, the patient would be at acceptable risk for the planned procedure without further cardiovascular testing.   Pharmacy reviewed his chart and reviewed anticoagulation recommendations with Dr. Tamala Julian - "Ok to hold Elqiuis 3 days prior to procedure per Dr.Smith."  The patient was advised that if he develops new symptoms prior to surgery to contact our office to arrange for a follow-up visit, and he verbalized understanding.  I will route this recommendation to the requesting party via Epic fax function and remove from pre-op pool. Please call with questions.  Charlie Pitter, PA-C 12/21/2020, 5:20 PM

## 2020-12-21 NOTE — Telephone Encounter (Signed)
Patient returning a call from Pre-Op. Please call back

## 2020-12-23 DIAGNOSIS — E032 Hypothyroidism due to medicaments and other exogenous substances: Secondary | ICD-10-CM | POA: Diagnosis not present

## 2020-12-23 DIAGNOSIS — G459 Transient cerebral ischemic attack, unspecified: Secondary | ICD-10-CM | POA: Diagnosis not present

## 2020-12-23 DIAGNOSIS — M1711 Unilateral primary osteoarthritis, right knee: Secondary | ICD-10-CM | POA: Diagnosis not present

## 2020-12-23 DIAGNOSIS — E785 Hyperlipidemia, unspecified: Secondary | ICD-10-CM | POA: Diagnosis not present

## 2020-12-23 DIAGNOSIS — I480 Paroxysmal atrial fibrillation: Secondary | ICD-10-CM | POA: Diagnosis not present

## 2020-12-23 DIAGNOSIS — I129 Hypertensive chronic kidney disease with stage 1 through stage 4 chronic kidney disease, or unspecified chronic kidney disease: Secondary | ICD-10-CM | POA: Diagnosis not present

## 2021-01-11 ENCOUNTER — Telehealth: Payer: Self-pay | Admitting: Interventional Cardiology

## 2021-01-11 NOTE — Telephone Encounter (Signed)
Patient's wife called and wanted to know when should the patient return to taking Eliquis. Please return phone call

## 2021-01-11 NOTE — Telephone Encounter (Signed)
Left message to call back  

## 2021-01-11 NOTE — Telephone Encounter (Signed)
Spoke with wife and made her aware to reach out to surgeon's office and find out when they feel it is ok for him to restart his Eliquis.

## 2021-01-18 ENCOUNTER — Telehealth: Payer: Self-pay | Admitting: *Deleted

## 2021-01-18 NOTE — Telephone Encounter (Signed)
CALLED PATIENT TO REMIND OF SIM APPT. FOR 01-19-21- ARRIVAL TIME- 9:45 AM @ [facility], SPOKE WITH PATIENT AND HE IS AWARE OF THIS APPT.

## 2021-01-19 ENCOUNTER — Other Ambulatory Visit: Payer: Self-pay

## 2021-01-19 ENCOUNTER — Encounter: Payer: Self-pay | Admitting: Medical Oncology

## 2021-01-19 ENCOUNTER — Ambulatory Visit
Admission: RE | Admit: 2021-01-19 | Discharge: 2021-01-19 | Disposition: A | Discharge status: Home / Self Care | Payer: Medicare Other | Source: Ambulatory Visit | Attending: Radiation Oncology | Admitting: Radiation Oncology

## 2021-01-19 DIAGNOSIS — C61 Malignant neoplasm of prostate: Secondary | ICD-10-CM

## 2021-01-19 DIAGNOSIS — Z510 Encounter for antineoplastic radiation therapy: Secondary | ICD-10-CM | POA: Insufficient documentation

## 2021-01-19 NOTE — Progress Notes (Signed)
  Radiation Oncology         (336) [phone] ________________________________  Name: Thomas [name] MRN: [id number]  Date: 01/19/2021  DOB: 1935-01-15  SIMULATION AND TREATMENT PLANNING NOTE    ICD-10-CM   1. Malignant neoplasm of prostate ([place])  C61     DIAGNOSIS:  86 y.o. 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  NARRATIVE:  The patient was brought to the [facility].  Identity was confirmed.  All relevant records and images related to the planned course of therapy were reviewed.  The patient freely provided informed written consent to proceed with treatment after reviewing the details related to the planned course of therapy. The consent form was witnessed and verified by the simulation staff.  Then, the patient was set-up in a stable reproducible supine position for radiation therapy.  A vacuum lock pillow device was custom fabricated to position his legs in a reproducible immobilized position.  Then, I performed a urethrogram under sterile conditions to identify the prostatic bed.  CT images were obtained.  Surface markings were placed.  The CT images were loaded into the planning software.  Then the prostate bed target, pelvic lymph node target and avoidance structures including the rectum, bladder, bowel and hips were contoured.  Treatment planning then occurred.  The radiation prescription was entered and confirmed.  A total of one complex treatment devices were fabricated. I have requested : Intensity Modulated Radiotherapy (IMRT) is medically necessary for this case for the following reason:  Rectal sparing.Marland Kitchen  PLAN:  The patient will receive 45 Gy in 25 fractions of 1.8 Gy, followed by a boost to the prostate to a total dose of 75 Gy with 15 additional fractions of 2 Gy.   ________________________________  Sheral Apley Tammi Klippel, M.D.

## 2021-01-20 DIAGNOSIS — Z510 Encounter for antineoplastic radiation therapy: Secondary | ICD-10-CM | POA: Diagnosis not present

## 2021-01-25 DIAGNOSIS — M1711 Unilateral primary osteoarthritis, right knee: Secondary | ICD-10-CM | POA: Diagnosis not present

## 2021-01-25 DIAGNOSIS — I480 Paroxysmal atrial fibrillation: Secondary | ICD-10-CM | POA: Diagnosis not present

## 2021-01-25 DIAGNOSIS — E032 Hypothyroidism due to medicaments and other exogenous substances: Secondary | ICD-10-CM | POA: Diagnosis not present

## 2021-01-25 DIAGNOSIS — E785 Hyperlipidemia, unspecified: Secondary | ICD-10-CM | POA: Diagnosis not present

## 2021-01-25 DIAGNOSIS — I129 Hypertensive chronic kidney disease with stage 1 through stage 4 chronic kidney disease, or unspecified chronic kidney disease: Secondary | ICD-10-CM | POA: Diagnosis not present

## 2021-01-25 DIAGNOSIS — G459 Transient cerebral ischemic attack, unspecified: Secondary | ICD-10-CM | POA: Diagnosis not present

## 2021-01-28 ENCOUNTER — Ambulatory Visit
Admission: RE | Admit: 2021-01-28 | Discharge: 2021-01-28 | Disposition: A | Discharge status: Home / Self Care | Payer: Medicare Other | Source: Ambulatory Visit | Attending: Radiation Oncology | Admitting: Radiation Oncology

## 2021-01-28 ENCOUNTER — Encounter: Payer: Self-pay | Admitting: Medical Oncology

## 2021-01-28 ENCOUNTER — Other Ambulatory Visit: Payer: Self-pay

## 2021-01-28 DIAGNOSIS — C61 Malignant neoplasm of prostate: Secondary | ICD-10-CM

## 2021-01-28 DIAGNOSIS — Z510 Encounter for antineoplastic radiation therapy: Secondary | ICD-10-CM | POA: Diagnosis not present

## 2021-01-29 ENCOUNTER — Ambulatory Visit
Admission: RE | Admit: 2021-01-29 | Discharge: 2021-01-29 | Disposition: A | Discharge status: Home / Self Care | Payer: Medicare Other | Source: Ambulatory Visit | Attending: Radiation Oncology | Admitting: Radiation Oncology

## 2021-01-29 DIAGNOSIS — Z510 Encounter for antineoplastic radiation therapy: Secondary | ICD-10-CM | POA: Diagnosis not present

## 2021-02-01 ENCOUNTER — Other Ambulatory Visit: Payer: Self-pay

## 2021-02-01 ENCOUNTER — Ambulatory Visit
Admission: RE | Admit: 2021-02-01 | Discharge: 2021-02-01 | Disposition: A | Discharge status: Home / Self Care | Payer: Medicare Other | Source: Ambulatory Visit | Attending: Radiation Oncology | Admitting: Radiation Oncology

## 2021-02-01 DIAGNOSIS — C61 Malignant neoplasm of prostate: Secondary | ICD-10-CM | POA: Insufficient documentation

## 2021-02-02 ENCOUNTER — Ambulatory Visit
Admission: RE | Admit: 2021-02-02 | Discharge: 2021-02-02 | Disposition: A | Discharge status: Home / Self Care | Payer: Medicare Other | Source: Ambulatory Visit | Attending: Radiation Oncology | Admitting: Radiation Oncology

## 2021-02-02 ENCOUNTER — Inpatient Hospital Stay: Payer: Medicare Other | Attending: Internal Medicine | Admitting: Nutrition

## 2021-02-02 DIAGNOSIS — C61 Malignant neoplasm of prostate: Secondary | ICD-10-CM | POA: Diagnosis not present

## 2021-02-02 NOTE — Progress Notes (Signed)
Patient was referred by RN. He is an 86 year old male diagnosed with prostate cancer receiving radiation therapy and followed by Dr. Tammi Klippel.  Past medical history includes multiple myeloma, renal insufficiency, atrial fib, hypertension, hypercholesterolemia, chronic kidney disease, BPH.  Medications include vitamin D3  Labs include creatinine 1.54 on March 10.  Height: 65 inches. Weight: 175.4 pounds March 17. BMI: 29.19.  Patient reports he is tolerating radiation therapy well.  He denies constipation or diarrhea at this time. He reports bowels are moving okay.  He states he drinks mostly water and decaffeinated beverages.  He typically eats 2 meals plus one snack a day.  He drinks one half of a carton of original boost.  He is interested in diet information on prostate cancer as well as low fiber diet clarification.  Nutrition diagnosis: Food and nutrition related knowledge deficit related to prostate cancer and associated treatments as evidenced by no prior need for nutrition related information.  Intervention: Patient educated on healthy plant-based diet for prostate cancer status posttreatment.  Provided nutrition facts sheets. Educated on low fiber diet for diarrhea during treatment.  Nutrition fact sheets provided. Questions were answered.  Teach back method used.  Contact information given.  Monitoring, evaluation, goals: Patient will tolerate adequate calories and protein to minimize loss of lean body mass.  Next visit: To be scheduled as needed.  **Disclaimer: This note was dictated with voice recognition software. Similar sounding words can inadvertently be transcribed and this note may contain transcription errors which may not have been corrected upon publication of note.**

## 2021-02-03 ENCOUNTER — Ambulatory Visit
Admission: RE | Admit: 2021-02-03 | Discharge: 2021-02-03 | Disposition: A | Discharge status: Home / Self Care | Payer: Medicare Other | Source: Ambulatory Visit | Attending: Radiation Oncology | Admitting: Radiation Oncology

## 2021-02-03 ENCOUNTER — Other Ambulatory Visit: Payer: Self-pay

## 2021-02-03 DIAGNOSIS — C61 Malignant neoplasm of prostate: Secondary | ICD-10-CM | POA: Diagnosis not present

## 2021-02-04 ENCOUNTER — Ambulatory Visit
Admission: RE | Admit: 2021-02-04 | Discharge: 2021-02-04 | Disposition: A | Discharge status: Home / Self Care | Payer: Medicare Other | Source: Ambulatory Visit | Attending: Radiation Oncology | Admitting: Radiation Oncology

## 2021-02-04 DIAGNOSIS — C61 Malignant neoplasm of prostate: Secondary | ICD-10-CM | POA: Diagnosis not present

## 2021-02-05 ENCOUNTER — Other Ambulatory Visit: Payer: Self-pay

## 2021-02-05 ENCOUNTER — Ambulatory Visit
Admission: RE | Admit: 2021-02-05 | Discharge: 2021-02-05 | Disposition: A | Discharge status: Home / Self Care | Payer: Medicare Other | Source: Ambulatory Visit | Attending: Radiation Oncology | Admitting: Radiation Oncology

## 2021-02-05 DIAGNOSIS — C61 Malignant neoplasm of prostate: Secondary | ICD-10-CM | POA: Diagnosis not present

## 2021-02-08 ENCOUNTER — Other Ambulatory Visit: Payer: Self-pay

## 2021-02-08 ENCOUNTER — Ambulatory Visit
Admission: RE | Admit: 2021-02-08 | Discharge: 2021-02-08 | Disposition: A | Discharge status: Home / Self Care | Payer: Medicare Other | Source: Ambulatory Visit | Attending: Radiation Oncology | Admitting: Radiation Oncology

## 2021-02-08 DIAGNOSIS — C61 Malignant neoplasm of prostate: Secondary | ICD-10-CM | POA: Diagnosis not present

## 2021-02-09 ENCOUNTER — Ambulatory Visit
Admission: RE | Admit: 2021-02-09 | Discharge: 2021-02-09 | Disposition: A | Discharge status: Home / Self Care | Payer: Medicare Other | Source: Ambulatory Visit | Attending: Radiation Oncology | Admitting: Radiation Oncology

## 2021-02-09 DIAGNOSIS — C61 Malignant neoplasm of prostate: Secondary | ICD-10-CM | POA: Diagnosis not present

## 2021-02-10 ENCOUNTER — Ambulatory Visit
Admission: RE | Admit: 2021-02-10 | Discharge: 2021-02-10 | Disposition: A | Discharge status: Home / Self Care | Payer: Medicare Other | Source: Ambulatory Visit | Attending: Radiation Oncology | Admitting: Radiation Oncology

## 2021-02-10 ENCOUNTER — Other Ambulatory Visit: Payer: Self-pay

## 2021-02-10 DIAGNOSIS — C61 Malignant neoplasm of prostate: Secondary | ICD-10-CM | POA: Diagnosis not present

## 2021-02-11 ENCOUNTER — Other Ambulatory Visit: Payer: Self-pay | Admitting: Radiation Oncology

## 2021-02-11 ENCOUNTER — Ambulatory Visit
Admission: RE | Admit: 2021-02-11 | Discharge: 2021-02-11 | Disposition: A | Discharge status: Home / Self Care | Payer: Medicare Other | Source: Ambulatory Visit | Attending: Radiation Oncology | Admitting: Radiation Oncology

## 2021-02-11 DIAGNOSIS — C61 Malignant neoplasm of prostate: Secondary | ICD-10-CM | POA: Diagnosis not present

## 2021-02-11 MED ORDER — TAMSULOSIN HCL 0.4 MG PO CAPS: 0.4000 mg | ORAL_CAPSULE | Freq: Every day | ORAL | 5 refills | Status: DC

## 2021-02-12 ENCOUNTER — Ambulatory Visit
Admission: RE | Admit: 2021-02-12 | Discharge: 2021-02-12 | Disposition: A | Discharge status: Home / Self Care | Payer: Medicare Other | Source: Ambulatory Visit | Attending: Radiation Oncology | Admitting: Radiation Oncology

## 2021-02-12 DIAGNOSIS — C61 Malignant neoplasm of prostate: Secondary | ICD-10-CM | POA: Diagnosis not present

## 2021-02-15 ENCOUNTER — Telehealth: Payer: Self-pay

## 2021-02-15 ENCOUNTER — Ambulatory Visit: Payer: Medicare Other

## 2021-02-15 ENCOUNTER — Ambulatory Visit
Admission: RE | Admit: 2021-02-15 | Discharge: 2021-02-15 | Disposition: A | Discharge status: Home / Self Care | Payer: Medicare Other | Source: Ambulatory Visit | Attending: Radiation Oncology | Admitting: Radiation Oncology

## 2021-02-15 DIAGNOSIS — C61 Malignant neoplasm of prostate: Secondary | ICD-10-CM | POA: Diagnosis not present

## 2021-02-15 NOTE — Telephone Encounter (Signed)
Nutrition  Message received from nursing regarding patient's wife with questions regarding nutrition.   Called but no answer.  Left message with call back number.   Floride Hutmacher B. Zenia Resides, [place], [place] Registered Dietitian [phone] (mobile)

## 2021-02-15 NOTE — Progress Notes (Signed)
See telephone note.

## 2021-02-15 NOTE — Telephone Encounter (Signed)
Nutrition  Wife returned RD's call.  RD answered questions regarding diet and diarrhea.   Wife appreciative of call.  Andres Bantz B. Zenia Resides, [place], [place] Registered Dietitian [phone] (mobile)

## 2021-02-16 ENCOUNTER — Other Ambulatory Visit: Payer: Self-pay

## 2021-02-16 ENCOUNTER — Ambulatory Visit
Admission: RE | Admit: 2021-02-16 | Discharge: 2021-02-16 | Disposition: A | Discharge status: Home / Self Care | Payer: Medicare Other | Source: Ambulatory Visit | Attending: Radiation Oncology | Admitting: Radiation Oncology

## 2021-02-16 DIAGNOSIS — C61 Malignant neoplasm of prostate: Secondary | ICD-10-CM | POA: Diagnosis not present

## 2021-02-17 ENCOUNTER — Ambulatory Visit
Admission: RE | Admit: 2021-02-17 | Discharge: 2021-02-17 | Disposition: A | Discharge status: Home / Self Care | Payer: Medicare Other | Source: Ambulatory Visit | Attending: Radiation Oncology | Admitting: Radiation Oncology

## 2021-02-17 DIAGNOSIS — C61 Malignant neoplasm of prostate: Secondary | ICD-10-CM | POA: Diagnosis not present

## 2021-02-18 ENCOUNTER — Ambulatory Visit
Admission: RE | Admit: 2021-02-18 | Discharge: 2021-02-18 | Disposition: A | Discharge status: Home / Self Care | Payer: Medicare Other | Source: Ambulatory Visit | Attending: Radiation Oncology | Admitting: Radiation Oncology

## 2021-02-18 ENCOUNTER — Other Ambulatory Visit: Payer: Self-pay

## 2021-02-18 DIAGNOSIS — C61 Malignant neoplasm of prostate: Secondary | ICD-10-CM | POA: Diagnosis not present

## 2021-02-19 ENCOUNTER — Ambulatory Visit
Admission: RE | Admit: 2021-02-19 | Discharge: 2021-02-19 | Disposition: A | Discharge status: Home / Self Care | Payer: Medicare Other | Source: Ambulatory Visit | Attending: Radiation Oncology | Admitting: Radiation Oncology

## 2021-02-19 DIAGNOSIS — C61 Malignant neoplasm of prostate: Secondary | ICD-10-CM | POA: Diagnosis not present

## 2021-02-22 ENCOUNTER — Ambulatory Visit
Admission: RE | Admit: 2021-02-22 | Discharge: 2021-02-22 | Disposition: A | Discharge status: Home / Self Care | Payer: Medicare Other | Source: Ambulatory Visit | Attending: Radiation Oncology | Admitting: Radiation Oncology

## 2021-02-22 ENCOUNTER — Other Ambulatory Visit: Payer: Self-pay

## 2021-02-22 DIAGNOSIS — C61 Malignant neoplasm of prostate: Secondary | ICD-10-CM | POA: Diagnosis not present

## 2021-02-23 ENCOUNTER — Other Ambulatory Visit: Payer: Self-pay

## 2021-02-23 ENCOUNTER — Ambulatory Visit
Admission: RE | Admit: 2021-02-23 | Discharge: 2021-02-23 | Disposition: A | Discharge status: Home / Self Care | Payer: Medicare Other | Source: Ambulatory Visit | Attending: Radiation Oncology | Admitting: Radiation Oncology

## 2021-02-23 DIAGNOSIS — C61 Malignant neoplasm of prostate: Secondary | ICD-10-CM | POA: Diagnosis not present

## 2021-02-24 ENCOUNTER — Ambulatory Visit
Admission: RE | Admit: 2021-02-24 | Discharge: 2021-02-24 | Disposition: A | Discharge status: Home / Self Care | Payer: Medicare Other | Source: Ambulatory Visit | Attending: Radiation Oncology | Admitting: Radiation Oncology

## 2021-02-24 DIAGNOSIS — C61 Malignant neoplasm of prostate: Secondary | ICD-10-CM | POA: Diagnosis not present

## 2021-02-25 ENCOUNTER — Other Ambulatory Visit: Payer: Self-pay

## 2021-02-25 ENCOUNTER — Telehealth: Payer: Self-pay | Admitting: Radiation Oncology

## 2021-02-25 ENCOUNTER — Ambulatory Visit
Admission: RE | Admit: 2021-02-25 | Discharge: 2021-02-25 | Disposition: A | Discharge status: Home / Self Care | Payer: Medicare Other | Source: Ambulatory Visit | Attending: Radiation Oncology | Admitting: Radiation Oncology

## 2021-02-25 DIAGNOSIS — C61 Malignant neoplasm of prostate: Secondary | ICD-10-CM | POA: Diagnosis not present

## 2021-02-25 NOTE — Telephone Encounter (Signed)
Received fax from [facility] with patient's most recent PSA result. Fax reveals patient's PSA on 02/22/21 to be 24.20. Also, fax inquired if patient had begun radiation treatment. Faxed back this note confirming the patient began radiation therapy on 01/28/2021 and is scheduled to complete therapy on 03/25/21. Also, faxed Dr. Johny Shears consult and sim note for review. Fax confirmation of delivery obtained.

## 2021-02-26 ENCOUNTER — Ambulatory Visit
Admission: RE | Admit: 2021-02-26 | Discharge: 2021-02-26 | Disposition: A | Discharge status: Home / Self Care | Payer: Medicare Other | Source: Ambulatory Visit | Attending: Radiation Oncology | Admitting: Radiation Oncology

## 2021-02-26 ENCOUNTER — Other Ambulatory Visit: Payer: Self-pay

## 2021-02-26 DIAGNOSIS — R350 Frequency of micturition: Secondary | ICD-10-CM | POA: Diagnosis not present

## 2021-02-26 DIAGNOSIS — C61 Malignant neoplasm of prostate: Secondary | ICD-10-CM | POA: Diagnosis not present

## 2021-03-02 ENCOUNTER — Other Ambulatory Visit: Payer: Self-pay

## 2021-03-02 ENCOUNTER — Ambulatory Visit
Admission: RE | Admit: 2021-03-02 | Discharge: 2021-03-02 | Disposition: A | Discharge status: Home / Self Care | Payer: Medicare Other | Source: Ambulatory Visit | Attending: Radiation Oncology | Admitting: Radiation Oncology

## 2021-03-02 DIAGNOSIS — C61 Malignant neoplasm of prostate: Secondary | ICD-10-CM | POA: Diagnosis not present

## 2021-03-03 ENCOUNTER — Other Ambulatory Visit: Payer: Self-pay

## 2021-03-03 ENCOUNTER — Ambulatory Visit
Admission: RE | Admit: 2021-03-03 | Discharge: 2021-03-03 | Disposition: A | Discharge status: Home / Self Care | Payer: Medicare Other | Source: Ambulatory Visit | Attending: Radiation Oncology | Admitting: Radiation Oncology

## 2021-03-03 DIAGNOSIS — C61 Malignant neoplasm of prostate: Secondary | ICD-10-CM | POA: Diagnosis present

## 2021-03-04 ENCOUNTER — Ambulatory Visit
Admission: RE | Admit: 2021-03-04 | Discharge: 2021-03-04 | Disposition: A | Discharge status: Home / Self Care | Payer: Medicare Other | Source: Ambulatory Visit | Attending: Radiation Oncology | Admitting: Radiation Oncology

## 2021-03-04 ENCOUNTER — Other Ambulatory Visit: Payer: Self-pay

## 2021-03-04 DIAGNOSIS — C61 Malignant neoplasm of prostate: Secondary | ICD-10-CM | POA: Diagnosis not present

## 2021-03-04 NOTE — Progress Notes (Signed)
  Radiation Oncology         (336) [phone] ________________________________  Name: Thomas [name] MRN: [id number]  Date: 03/02/2021  DOB: [birth year]  SIMULATION NOTE    ICD-10-CM   1. Malignant neoplasm of prostate (Thomas [name])  C61     DIAGNOSIS:   86 y.o. 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  NARRATIVE: This patient is currently receiving IMRT for prostate cancer.  His daily image guidance films appear to show inconsistent rectal tone compared to plan CT.  Based on these anatomic changes, the patient was brought to the [facility].  Identity was confirmed.  All relevant records and images related to the planned course of therapy were reviewed.   Then, the patient was set-up in his stable reproducible  supine position for radiation therapy.  CT images were obtained.  Surface markings were placed.  The CT images were loaded into the planning software.  New target and avoidance structures were contoured.  Treatment planning then occurred.    PLAN:  This CT simulation study will be used for the radiation boost.  ________________________________  Sheral Apley. Tammi Klippel, M.D.

## 2021-03-05 ENCOUNTER — Ambulatory Visit
Admission: RE | Admit: 2021-03-05 | Discharge: 2021-03-05 | Disposition: A | Discharge status: Home / Self Care | Payer: Medicare Other | Source: Ambulatory Visit | Attending: Radiation Oncology | Admitting: Radiation Oncology

## 2021-03-05 DIAGNOSIS — C61 Malignant neoplasm of prostate: Secondary | ICD-10-CM | POA: Diagnosis not present

## 2021-03-08 ENCOUNTER — Ambulatory Visit
Admission: RE | Admit: 2021-03-08 | Discharge: 2021-03-08 | Disposition: A | Discharge status: Home / Self Care | Payer: Medicare Other | Source: Ambulatory Visit | Attending: Radiation Oncology | Admitting: Radiation Oncology

## 2021-03-08 DIAGNOSIS — C61 Malignant neoplasm of prostate: Secondary | ICD-10-CM | POA: Diagnosis not present

## 2021-03-09 ENCOUNTER — Other Ambulatory Visit: Payer: Self-pay

## 2021-03-09 ENCOUNTER — Ambulatory Visit
Admission: RE | Admit: 2021-03-09 | Discharge: 2021-03-09 | Disposition: A | Discharge status: Home / Self Care | Payer: Medicare Other | Source: Ambulatory Visit | Attending: Radiation Oncology | Admitting: Radiation Oncology

## 2021-03-09 DIAGNOSIS — C61 Malignant neoplasm of prostate: Secondary | ICD-10-CM | POA: Diagnosis not present

## 2021-03-10 ENCOUNTER — Ambulatory Visit
Admission: RE | Admit: 2021-03-10 | Discharge: 2021-03-10 | Disposition: A | Discharge status: Home / Self Care | Payer: Medicare Other | Source: Ambulatory Visit | Attending: Radiation Oncology | Admitting: Radiation Oncology

## 2021-03-10 ENCOUNTER — Other Ambulatory Visit: Payer: Self-pay

## 2021-03-10 DIAGNOSIS — C61 Malignant neoplasm of prostate: Secondary | ICD-10-CM | POA: Diagnosis not present

## 2021-03-11 ENCOUNTER — Other Ambulatory Visit: Payer: Self-pay

## 2021-03-11 ENCOUNTER — Ambulatory Visit
Admission: RE | Admit: 2021-03-11 | Discharge: 2021-03-11 | Disposition: A | Discharge status: Home / Self Care | Payer: Medicare Other | Source: Ambulatory Visit | Attending: Radiation Oncology | Admitting: Radiation Oncology

## 2021-03-11 DIAGNOSIS — C61 Malignant neoplasm of prostate: Secondary | ICD-10-CM | POA: Diagnosis not present

## 2021-03-12 ENCOUNTER — Ambulatory Visit
Admission: RE | Admit: 2021-03-12 | Discharge: 2021-03-12 | Disposition: A | Discharge status: Home / Self Care | Payer: Medicare Other | Source: Ambulatory Visit | Attending: Radiation Oncology | Admitting: Radiation Oncology

## 2021-03-12 ENCOUNTER — Other Ambulatory Visit: Payer: Self-pay

## 2021-03-12 DIAGNOSIS — C61 Malignant neoplasm of prostate: Secondary | ICD-10-CM | POA: Diagnosis not present

## 2021-03-15 ENCOUNTER — Other Ambulatory Visit: Payer: Self-pay

## 2021-03-15 ENCOUNTER — Ambulatory Visit
Admission: RE | Admit: 2021-03-15 | Discharge: 2021-03-15 | Disposition: A | Discharge status: Home / Self Care | Payer: Medicare Other | Source: Ambulatory Visit | Attending: Radiation Oncology | Admitting: Radiation Oncology

## 2021-03-15 DIAGNOSIS — C61 Malignant neoplasm of prostate: Secondary | ICD-10-CM | POA: Diagnosis not present

## 2021-03-16 ENCOUNTER — Ambulatory Visit
Admission: RE | Admit: 2021-03-16 | Discharge: 2021-03-16 | Disposition: A | Discharge status: Home / Self Care | Payer: Medicare Other | Source: Ambulatory Visit | Attending: Radiation Oncology | Admitting: Radiation Oncology

## 2021-03-16 DIAGNOSIS — C61 Malignant neoplasm of prostate: Secondary | ICD-10-CM | POA: Diagnosis not present

## 2021-03-17 ENCOUNTER — Ambulatory Visit
Admission: RE | Admit: 2021-03-17 | Discharge: 2021-03-17 | Disposition: A | Discharge status: Home / Self Care | Payer: Medicare Other | Source: Ambulatory Visit | Attending: Radiation Oncology | Admitting: Radiation Oncology

## 2021-03-17 ENCOUNTER — Other Ambulatory Visit: Payer: Self-pay

## 2021-03-17 DIAGNOSIS — C61 Malignant neoplasm of prostate: Secondary | ICD-10-CM | POA: Diagnosis not present

## 2021-03-18 ENCOUNTER — Other Ambulatory Visit: Payer: Self-pay

## 2021-03-18 ENCOUNTER — Ambulatory Visit
Admission: RE | Admit: 2021-03-18 | Discharge: 2021-03-18 | Disposition: A | Discharge status: Home / Self Care | Payer: Medicare Other | Source: Ambulatory Visit | Attending: Radiation Oncology | Admitting: Radiation Oncology

## 2021-03-18 DIAGNOSIS — C61 Malignant neoplasm of prostate: Secondary | ICD-10-CM | POA: Diagnosis not present

## 2021-03-19 ENCOUNTER — Ambulatory Visit
Admission: RE | Admit: 2021-03-19 | Discharge: 2021-03-19 | Disposition: A | Discharge status: Home / Self Care | Payer: Medicare Other | Source: Ambulatory Visit | Attending: Radiation Oncology | Admitting: Radiation Oncology

## 2021-03-19 DIAGNOSIS — C61 Malignant neoplasm of prostate: Secondary | ICD-10-CM | POA: Diagnosis not present

## 2021-03-22 ENCOUNTER — Other Ambulatory Visit: Payer: Self-pay

## 2021-03-22 ENCOUNTER — Ambulatory Visit
Admission: RE | Admit: 2021-03-22 | Discharge: 2021-03-22 | Disposition: A | Discharge status: Home / Self Care | Payer: Medicare Other | Source: Ambulatory Visit | Attending: Radiation Oncology | Admitting: Radiation Oncology

## 2021-03-22 DIAGNOSIS — C61 Malignant neoplasm of prostate: Secondary | ICD-10-CM | POA: Diagnosis not present

## 2021-03-23 ENCOUNTER — Ambulatory Visit
Admission: RE | Admit: 2021-03-23 | Discharge: 2021-03-23 | Disposition: A | Discharge status: Home / Self Care | Payer: Medicare Other | Source: Ambulatory Visit | Attending: Radiation Oncology | Admitting: Radiation Oncology

## 2021-03-23 DIAGNOSIS — C61 Malignant neoplasm of prostate: Secondary | ICD-10-CM | POA: Diagnosis not present

## 2021-03-24 ENCOUNTER — Ambulatory Visit
Admission: RE | Admit: 2021-03-24 | Discharge: 2021-03-24 | Disposition: A | Discharge status: Home / Self Care | Payer: Medicare Other | Source: Ambulatory Visit | Attending: Radiation Oncology | Admitting: Radiation Oncology

## 2021-03-24 ENCOUNTER — Other Ambulatory Visit: Payer: Self-pay

## 2021-03-24 DIAGNOSIS — C61 Malignant neoplasm of prostate: Secondary | ICD-10-CM | POA: Diagnosis not present

## 2021-03-25 ENCOUNTER — Ambulatory Visit
Admission: RE | Admit: 2021-03-25 | Discharge: 2021-03-25 | Disposition: A | Discharge status: Home / Self Care | Payer: Medicare Other | Source: Ambulatory Visit | Attending: Radiation Oncology | Admitting: Radiation Oncology

## 2021-03-25 ENCOUNTER — Encounter: Payer: Self-pay | Admitting: Urology

## 2021-03-25 DIAGNOSIS — C61 Malignant neoplasm of prostate: Secondary | ICD-10-CM | POA: Diagnosis not present

## 2021-03-26 ENCOUNTER — Other Ambulatory Visit: Payer: Self-pay | Admitting: Interventional Cardiology

## 2021-03-26 NOTE — Telephone Encounter (Signed)
Age 86, weight 80kg, SCr 1.2 on 02/19/21, however was 1.54 on 12/10/20 and 1.92 on 09/10/20 Will refill current dose for now and recheck BMET at next OV with Dr Tamala Julian in August to see if dose change is needed

## 2021-04-01 DIAGNOSIS — I129 Hypertensive chronic kidney disease with stage 1 through stage 4 chronic kidney disease, or unspecified chronic kidney disease: Secondary | ICD-10-CM | POA: Diagnosis not present

## 2021-04-01 DIAGNOSIS — G459 Transient cerebral ischemic attack, unspecified: Secondary | ICD-10-CM | POA: Diagnosis not present

## 2021-04-01 DIAGNOSIS — E032 Hypothyroidism due to medicaments and other exogenous substances: Secondary | ICD-10-CM | POA: Diagnosis not present

## 2021-04-01 DIAGNOSIS — E785 Hyperlipidemia, unspecified: Secondary | ICD-10-CM | POA: Diagnosis not present

## 2021-04-01 DIAGNOSIS — I480 Paroxysmal atrial fibrillation: Secondary | ICD-10-CM | POA: Diagnosis not present

## 2021-04-01 DIAGNOSIS — M1711 Unilateral primary osteoarthritis, right knee: Secondary | ICD-10-CM | POA: Diagnosis not present

## 2021-04-13 DIAGNOSIS — G4733 Obstructive sleep apnea (adult) (pediatric): Secondary | ICD-10-CM | POA: Diagnosis not present

## 2021-04-16 ENCOUNTER — Other Ambulatory Visit: Payer: Self-pay

## 2021-04-16 ENCOUNTER — Ambulatory Visit
Admission: RE | Admit: 2021-04-16 | Discharge: 2021-04-16 | Disposition: A | Discharge status: Home / Self Care | Payer: Medicare Other | Source: Ambulatory Visit | Attending: Family Medicine | Admitting: Family Medicine

## 2021-04-16 DIAGNOSIS — M8580 Other specified disorders of bone density and structure, unspecified site: Secondary | ICD-10-CM

## 2021-04-16 DIAGNOSIS — M85851 Other specified disorders of bone density and structure, right thigh: Secondary | ICD-10-CM | POA: Diagnosis not present

## 2021-04-28 ENCOUNTER — Other Ambulatory Visit: Payer: Self-pay | Admitting: Interventional Cardiology

## 2021-04-28 DIAGNOSIS — I480 Paroxysmal atrial fibrillation: Secondary | ICD-10-CM

## 2021-04-28 NOTE — Telephone Encounter (Signed)
Prescription refill request for Eliquis received. Indication: a fib Last office visit: 10/01/20 Scr: 1.54 Age: 86 Weight: 79 kg

## 2021-05-04 ENCOUNTER — Encounter: Payer: Self-pay | Admitting: Urology

## 2021-05-04 NOTE — Progress Notes (Signed)
Patient due for follow up appointment after receiving radiation therapy to prostate. Patient completed treatment on 03/25/21. Meaningful use complete.Made patient aware the follow up visit will be a telephone visit. Patient voiced understanding. Patient reports not completely emptying bladder almost always. Reports urinary frequency, intermittency, and urgency more than half the time. States having nocturia, getting up 3-4 times per night. Reports having a weak urine stream. Patient needs to strain to start urination less than 1 in 5 times. Patient has follow up appointment with urologist at the end of September. Patient's IPSS score is 25.

## 2021-05-04 NOTE — Progress Notes (Signed)
  Radiation Oncology         [phone]) [phone] ________________________________  Name: Thomas [name] MRN: [id number]  Date: 03/25/2021  DOB: Jan 14, 1935  End of Treatment Note  Diagnosis:   86 y.o. 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     Indication for treatment:  Curative, Definitive Radiotherapy       Radiation treatment dates:   01/28/21 - 03/25/21  Site/dose:  1. The prostate, seminal vesicles, and pelvic lymph nodes were initially treated to 45 Gy in 25 fractions of 1.8 Gy  2. The prostate only was boosted to 75 Gy with 15 additional fractions of 2.0 Gy   Beams/energy:  1. The prostate, seminal vesicles, and pelvic lymph nodes were initially treated using VMAT intensity modulated radiotherapy delivering 6 megavolt photons. Image guidance was performed with CB-CT studies prior to each fraction. He was immobilized with a body fix lower extremity mold.  2. the prostate only was boosted using VMAT intensity modulated radiotherapy delivering 6 megavolt photons. Image guidance was performed with CB-CT studies prior to each fraction. He was immobilized with a body fix lower extremity mold.  Narrative: The patient tolerated radiation treatment relatively well with only minor urinary irritation and modest fatigue.  He did experience increased frequency, urgency, nocturia and hesitancy despite taking Flomax daily.  He also reported diarrhea which was manageable.  Plan: The patient has completed radiation treatment. He will return to radiation oncology clinic for routine followup in one month. I advised him to call or return sooner if he has any questions or concerns related to his recovery or treatment. ________________________________  Sheral Apley. Tammi Klippel, M.D.

## 2021-05-05 ENCOUNTER — Other Ambulatory Visit: Payer: Self-pay

## 2021-05-05 ENCOUNTER — Ambulatory Visit
Admission: RE | Admit: 2021-05-05 | Discharge: 2021-05-05 | Disposition: A | Discharge status: Home / Self Care | Payer: Medicare Other | Source: Ambulatory Visit | Attending: Urology | Admitting: Urology

## 2021-05-05 DIAGNOSIS — C61 Malignant neoplasm of prostate: Secondary | ICD-10-CM

## 2021-05-05 NOTE — Progress Notes (Signed)
Radiation Oncology         (336) [phone] ________________________________  Name: Thomas [name] MRN: [id number]  Date: 05/05/2021  DOB: July 23, 1935  Post Treatment Note  CC: [name], MD  Raynelle Bring, MD  Diagnosis:   86 y.o. 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  Interval Since Last Radiation:  6 weeks  01/28/21 - 03/25/21: 1. The prostate, seminal vesicles, and pelvic lymph nodes were initially treated to 45 Gy in 25 fractions of 1.8 Gy 2. The prostate only was boosted to 75 Gy with 15 additional fractions of 2.0 Gy  Narrative:  I attempted to call the patient to conduct his routine scheduled 1 month follow up visit via telephone to spare the patient unnecessary potential exposure in the healthcare setting during the current COVID-19 pandemic.  The patient was notified in advance and gave permission to proceed with this visit format. There was no answer on either number listed in the chart but I was able to leave a detailed VM message on his cell phone. He tolerated radiation treatment relatively well with only minor urinary irritation and modest fatigue.  He did experience increased frequency, urgency, nocturia and hesitancy despite taking Flomax daily.  He also reported diarrhea which was manageable.                              On review of systems, the patient states that he continues with increased frequency, intermittency, urgency, nocturia 3-4 times per night, weak stream and feelings of incomplete bladder emptying.  He has continued taking Flomax daily as prescribed.  His current IPSS score is 25, indicating severe urinary symptoms.  He specifically denies dysuria, gross hematuria or incontinence.  He has a scheduled follow-up visit with Dr. Alinda Money at the end of September 2022.  ALLERGIES:  is allergic to benadryl [diphenhydramine hcl], ceftriaxone sodium, contrast media [iodinated diagnostic agents], nsaids, penicillins,  codeine, ibuprofen, levaquin [levofloxacin], oxybutynin, tramadol hcl, and chlorhexidine.  Meds: Current Outpatient Medications  Medication Sig Dispense Refill   acetaminophen (TYLENOL) 500 MG tablet Take 1,000 mg by mouth as needed.      apixaban (ELIQUIS) 2.5 MG TABS tablet TAKE ONE TABLET BY MOUTH TWICE DAILY 90 tablet 0   brimonidine-timolol (COMBIGAN) 0.2-0.5 % ophthalmic solution Place 1 drop into both eyes every 12 (twelve) hours.     Cholecalciferol (VITAMIN D3) 50 MCG (2000 UT) capsule Take 1 capsule by mouth daily.      diltiazem (CARDIZEM CD) 120 MG 24 hr capsule TAKE ONE CAPSULE BY MOUTH ONCE DAILY 90 capsule 1   latanoprost (XALATAN) 0.005 % ophthalmic solution INSTILL 1 DROP INTO BOTH EYES EVERY DAY AT NIGHT  6   tamsulosin (FLOMAX) 0.4 MG CAPS capsule Take 1 capsule (0.4 mg total) by mouth daily after supper. 30 capsule 5   No current facility-administered medications for this encounter.    Physical Findings:  vitals were not taken for this visit.  Pain Assessment Pain Score: 2  Pain Loc: Generalized/10 Unable to assess due to telephone follow-up visit format.  Lab Findings: Lab Results  Component Value Date   WBC 5.8 12/10/2020   HGB 16.8 12/10/2020   HCT 49.0 12/10/2020   MCV 93.3 12/10/2020   PLT 215 12/10/2020     Radiographic Findings: DG BONE DENSITY (DXA)  Result Date: 04/16/2021 EXAM: DUAL X-RAY ABSORPTIOMETRY (DXA) FOR BONE MINERAL DENSITY IMPRESSION: Referring Physician:  Catalina Your patient  completed a bone mineral density test using GE Lunar iDXA system (analysis version: 16). Technologist: [place] PATIENT: Name: Thomas, [name] Patient ID: [id number] Birth Date: Feb 18, 1935 Height: 64.5 in. Sex: Male Measured: 04/16/2021 Weight: 166.0 lbs. Indications: Advanced Age, Caucasian, Eliquis, Multiple Myloma, Prostate cancer, Tamsulosin, Secondary Osteoporosis Fractures: None Treatments: Vitamin D (E933.5) ASSESSMENT: The BMD measured at Femur Neck Right  is 0.826 g/cm2 with a T-score of -1.5. This patient is considered osteopenic/low bone mass according to [place] [facility]) criteria. The quality of the exam is good. The lumbar spine was excluded due to degenerative changes. Site Region Measured Date Measured Age YA BMD Significant CHANGE T-score DualFemur Neck Right 04/16/2021 84.2 -1.5 0.826 g/cm2 DualFemur Total Mean 04/16/2021 84.2 -0.7 0.917 g/cm2 Left Forearm Radius 33% 04/16/2021 84.2 -0.9 0.913 g/cm2 World Health Organization [facility]) criteria for post-menopausal, Caucasian Women: Normal       T-score at or above -1 SD Osteopenia   T-score between -1 and -2.5 SD Osteoporosis T-score at or below -2.5 SD RECOMMENDATION: [place] recommends that FDA-approved medical therapies be considered in postmenopausal women and men age 65 or older with a: 1. Hip or vertebral (clinical or morphometric) fracture. 2. T-score of less than or equal to -2.5 at the spine or hip. 3. Ten-year fracture probability by FRAX of 3% or greater for hip fracture or 20% or greater for major osteoporotic fracture. All treatment decisions require clinical judgment and consideration of individual patient factors, including patient preferences, co-morbidities, previous drug use, risk factors not captured in the FRAX model (e.g. falls, vitamin D deficiency, increased bone turnover, interval significant decline in bone density) and possible under- or over-estimation of fracture risk by FRAX. All patients should ensure an adequate intake of dietary calcium (1200 mg/d) and vitamin D (800 IU daily) unless contraindicated. FOLLOW-UP: People with diagnosed cases of osteoporosis or osteopenia should be regularly tested for bone mineral density. For patients eligible for Medicare, routine testing is allowed once every 2 years. The testing frequency can be increased to one year for patients who have rapidly progressing disease, or for those who are receiving medical  therapy to restore bone mass. I have reviewed this study and agree with the findings. [facility] FRAX* 10-year Probability of Fracture Based on femoral neck BMD: DualFemur (Right) Major Osteoporotic Fracture: 8.4% Hip Fracture:                3.4% Population:                  [place] (Caucasian) Risk Factors:                Secondary Osteoporosis *FRAX is a [place] of [employer] for Metabolic Bone Disease, a [place] (WHO) [facility]. ASSESSMENT: The probability of a major osteoporotic fracture is 8.4% within the next ten years. The probability of a hip fracture is 3.4% within the next ten years. Electronically Signed   By: Rolm Baptise M.D.   On: 04/16/2021 11:29    Impression/Plan: 1. 86 y.o. 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 He will continue to follow up with urology for ongoing PSA determinations and has an appointment scheduled for labs on 06/23/2021 and we will see Dr. Alinda Money the following week. He understands what to expect with regards to PSA monitoring going forward. I will look forward to following his response to treatment via correspondence with urology, and would be happy to  continue to participate in his care if clinically indicated. I talked to the patient about what to expect in the future, including his risk for erectile dysfunction and rectal bleeding. I encouraged him to call or return to the office if he has any questions regarding his previous radiation or possible radiation side effects. He was comfortable with this plan and will follow up as needed.     Nicholos Johns, PA-C

## 2021-05-07 NOTE — Progress Notes (Signed)
Cardiology Office Note:    Date:  05/10/2021   ID:  Thomas [name], DOB Dec 26, 1934, MRN [id number]  PCP:  [name], MD  Cardiologist:  [name], MD   Referring MD: [name], MD   Chief Complaint  Patient presents with   Atrial Fibrillation     History of Present Illness:    Thomas [name] is a 86 y.o. male with a hx of paroxysmal atrial fibrillation, chronic diastolic heart failure, hypertension, chronic kidney disease, unable to use anticoagulation therapy due to recurrent GU bleeding, and mild aortic stenosis.  Other medical issues include metastatic prostate CA ([place]) and indolent multiple myeloma.   He states that when recumbent in recliner, he will have occasion to have to sit up suddenly because he becomes short of breath.  He denies chest pain, palpitations, neck discomfort, and reflux associated with it.  Symptoms are short-lived.  Standing abolishes the discomfort quickly.  He has been having this off and on for the past 6 months.  May happen 4-5 times per week.  No other association.  Has never had these type symptoms to awaken him from sleep.  States that he sleeps on his sides.  He does not get this type breathing difficulty with exertion.  Past Medical History:  Diagnosis Date   BPH (benign prostatic hypertrophy)    Chronic diastolic heart failure ([place]) 12/27/2013   Chronic kidney disease    Hx of bilateral renal cysts.   Encounter for antineoplastic chemotherapy 02/20/2018   Glaucoma    Heart murmur    History of atrial fibrillation 03/09/09   Hx of epiglottitis    2011   Hypercholesterolemia    Hypertension    Long term current use of amiodarone 11/07/2013   Amiodarone    Mild aortic stenosis    OSA on CPAP 06/26/2014   Paroxysmal atrial fibrillation ([place]) 11/07/2013   Prostate cancer ([place]) 7/12   T1C Ad Ca  s/p TURP, observation only   Renal insufficiency 09/06/2011   Sinus node dysfunction ([place]) 08/14/2014   Sleep apnea    Smoldering  multiple myeloma ([place])     Past Surgical History:  Procedure Laterality Date   ADRENALECTOMY  02/12/11   Left, for Pheochromocytoma   CARDIOVERSION N/A 12/26/2013   Procedure: CARDIOVERSION;  Surgeon: [name], MD;  Location: [facility];  Service: Cardiovascular;  Laterality: N/A;   CHOLECYSTECTOMY  02/12/11   GASTRECTOMY  1984   S/P gastrectomy for ulcer   KNEE SURGERY  1971   S/P Right knee surgery   TEE WITHOUT CARDIOVERSION N/A 12/26/2013   Procedure: TRANSESOPHAGEAL ECHOCARDIOGRAM (TEE);  Surgeon: [name], MD;  Location: [facility];  Service: Cardiovascular;  Laterality: N/A;   TRANSURETHRAL RESECTION OF PROSTATE  7/12    Current Medications: Current Meds  Medication Sig   acetaminophen (TYLENOL) 500 MG tablet Take 1,000 mg by mouth as needed.    apixaban (ELIQUIS) 2.5 MG TABS tablet TAKE ONE TABLET BY MOUTH TWICE DAILY   brimonidine-timolol (COMBIGAN) 0.2-0.5 % ophthalmic solution Place 1 drop into both eyes every 12 (twelve) hours.   Cholecalciferol (VITAMIN D3) 50 MCG (2000 UT) capsule Take 1 capsule by mouth daily.    diltiazem (CARDIZEM CD) 120 MG 24 hr capsule TAKE ONE CAPSULE BY MOUTH ONCE DAILY   latanoprost (XALATAN) 0.005 % ophthalmic solution INSTILL 1 DROP INTO BOTH EYES EVERY DAY AT NIGHT   tamsulosin (FLOMAX) 0.4 MG CAPS capsule Take 1 capsule (0.4 mg total)  by mouth daily after supper.     Allergies:   Benadryl [diphenhydramine hcl], Ceftriaxone sodium, Contrast media [iodinated diagnostic agents], Nsaids, Penicillins, Codeine, Ibuprofen, Levaquin [levofloxacin], Oxybutynin, Tramadol hcl, and Chlorhexidine   Social History   Socioeconomic History   Marital status: Married    Spouse name: Not on file   Number of children: 3   Years of education: Not on file   Highest education level: Not on file  Occupational History    Employer: RETIRED  Tobacco Use   Smoking status: Never   Smokeless tobacco: Never  Vaping Use   Vaping Use: Never used   Substance and Sexual Activity   Alcohol use: No   Drug use: No   Sexual activity: Not Currently  Other Topics Concern   Not on file  Social History Narrative   Not on file   Social Determinants of Health   Financial Resource Strain: Not on file  Food Insecurity: Not on file  Transportation Needs: Not on file  Physical Activity: Not on file  Stress: Not on file  Social Connections: Not on file     Family History: The patient's family history includes Kidney Stones in his brother; Liver disease in his sister, sister, and sister; Melanoma in his brother; Polymyositis in his mother; Prostate cancer in his brother and father.  ROS:   Please see the history of present illness.    Status postradiation therapy for prostate cancer.  His feet tingle.  All other systems reviewed and are negative.  EKGs/Labs/Other Studies Reviewed:    The following studies were reviewed today: 2D Doppler echocardiogram 2021: IMPRESSIONS     1. Normal LV systolic function; mild LVH; midly dilated aortic root;  moderate AS (mean gradient 15 mmHg but calculated AVA 1 cm2); moderate AI;  mild MR; moderate LAE; mild TR.   2. Left ventricular ejection fraction, by estimation, is 60 to 65%. The  left ventricle has normal function. The left ventricle has no regional  wall motion abnormalities. There is mild left ventricular hypertrophy.  Left ventricular diastolic function  could not be evaluated.   3. Right ventricular systolic function is normal. The right ventricular  size is normal.   4. Left atrial size was moderately dilated.   5. The mitral valve is normal in structure. Mild mitral valve  regurgitation. No evidence of mitral stenosis.   6. The aortic valve is tricuspid. Aortic valve regurgitation is moderate.  Moderate aortic valve stenosis.   7. Aortic dilatation noted. There is mild dilatation of the aortic root  measuring 42 mm.   EKG:  EKG atrial fibs with controlled ventricular response.   Otherwise unremarkable and unchanged compared to prior.  Last tracing was performed in November 2021  Recent Labs: 12/10/2020: ALT 17; BUN 19; Creatinine 1.54; Hemoglobin 16.8; Platelet Count 215; Potassium 4.1; Sodium 141  Recent Lipid Panel    Component Value Date/Time   CHOL 164 02/25/2020 0842   TRIG 131 02/25/2020 0842   HDL 41 02/25/2020 0842   CHOLHDL 4.0 02/25/2020 0842   LDLCALC 100 (H) 02/25/2020 0842    Physical Exam:    VS:  BP 118/62   Pulse 75   Ht 5' 5"  (1.651 m)   Wt 165 lb 6.4 oz (75 kg)   SpO2 98%   BMI 27.52 kg/m     Wt Readings from Last 3 Encounters:  05/10/21 165 lb 6.4 oz (75 kg)  12/17/20 175 lb 6.4 oz (79.6 kg)  11/24/20 179 lb  6.4 oz (81.4 kg)     GEN: Normal weight skin: Is slightly pale. No acute distress HEENT: Normal NECK: No JVD. LYMPHATICS: No lymphadenopathy CARDIAC: 1/6 to 2/6 decrescendo diastolic aortic regurgitation murmur and 2/6 right upper sternal systolic murmur. RRR no gallop, or edema. VASCULAR:  Normal Pulses. No bruits. RESPIRATORY:  Clear to auscultation without rales, wheezing or rhonchi  ABDOMEN: Soft, non-tender, non-distended, No pulsatile mass, MUSCULOSKELETAL: No deformity  SKIN: Warm and dry NEUROLOGIC:  Alert and oriented x 3 PSYCHIATRIC:  Normal affect   ASSESSMENT:    1. Paroxysmal nocturnal dyspnea   2. Persistent atrial fibrillation ([place])   3. Chronic diastolic heart failure ([employer])   4. OSA on CPAP   5. Chronic anticoagulation   6. Multiple myeloma not having achieved remission ([place])   7. Essential hypertension   8. Stage 3b chronic kidney disease ([place])   9. Moderate aortic stenosis    PLAN:    In order of problems listed above:  2D Doppler echocardiogram to follow-up aortic stenosis.  Check BNP. Unchanged and with good rate control 2D Doppler echocardiogram to evaluate for evidence of significant valve lesion underestimated by clinical exam and decompensation of LV function.  The last echocardiogram  was done 1 year ago.  Rule out low-flow low gradient aortic stenosis.  There was mild aortic stenosis noted on the last study. Encourage CPAP Check hemoglobin and kidney function serially Did not discuss Excellent control Most recent creatinine 1.2 in May.   2D Doppler echocardiogram to follow aortic stenosis, done yearly.  Picking tension to this years study to rule out low-flow low gradient paradoxical AS   Medication Adjustments/Labs and Tests Ordered: Current medicines are reviewed at length with the patient today.  Concerns regarding medicines are outlined above.  Orders Placed This Encounter  Procedures   Basic metabolic panel   CBC   Pro b natriuretic peptide   EKG 12-Lead   ECHOCARDIOGRAM COMPLETE   No orders of the defined types were placed in this encounter.   Patient Instructions  Medication Instructions:  Your physician recommends that you continue on your current medications as directed. Please refer to the Current Medication list given to you today.  *If you need a refill on your cardiac medications before your next appointment, please call your pharmacy*   Lab Work: BMET, CBC and Pro BNP today  If you have labs (blood work) drawn today and your tests are completely normal, you will receive your results only by: [place] (if you have MyChart) OR A paper copy in the mail If you have any lab test that is abnormal or we need to change your treatment, we will call you to review the results.   Testing/Procedures: Your physician has requested that you have an echocardiogram. Echocardiography is a painless test that uses sound waves to create images of your heart. It provides your doctor with information about the size and shape of your heart and how well your heart's chambers and valves are working. This procedure takes approximately one hour. There are no restrictions for this procedure.   Follow-Up: At [facility], you and your health needs are our  priority.  As part of our continuing mission to provide you with exceptional heart care, we have created designated Provider Care Teams.  These Care Teams include your primary Cardiologist (physician) and Advanced Practice Providers (APPs -  Physician Assistants and Nurse Practitioners) who all work together to provide you with the care you need, when you need  it.  We recommend signing up for the patient portal called "MyChart".  Sign up information is provided on this After Visit Summary.  MyChart is used to connect with patients for Virtual Visits (Telemedicine).  Patients are able to view lab/test results, encounter notes, upcoming appointments, etc.  Non-urgent messages can be sent to your provider as well.   To learn more about what you can do with MyChart, go to [email or web address].    Your next appointment:   6 month(s)  The format for your next appointment:   In Person  Provider:   You may see [name], MD or one of the following Advanced Practice Providers on your designated Care Team:   Cecilie Kicks, NP    Other Instructions     Signed, [name], MD  05/10/2021 11:28 AM    [place]

## 2021-05-10 ENCOUNTER — Other Ambulatory Visit: Payer: Self-pay

## 2021-05-10 ENCOUNTER — Ambulatory Visit: Payer: Medicare Other | Admitting: Interventional Cardiology

## 2021-05-10 ENCOUNTER — Encounter: Payer: Self-pay | Admitting: Interventional Cardiology

## 2021-05-10 VITALS — BP 118/62 | HR 75 | Ht 65.0 in | Wt 165.4 lb

## 2021-05-10 DIAGNOSIS — I10 Essential (primary) hypertension: Secondary | ICD-10-CM

## 2021-05-10 DIAGNOSIS — R0600 Dyspnea, unspecified: Secondary | ICD-10-CM | POA: Diagnosis not present

## 2021-05-10 DIAGNOSIS — N1832 Chronic kidney disease, stage 3b: Secondary | ICD-10-CM

## 2021-05-10 DIAGNOSIS — I4819 Other persistent atrial fibrillation: Secondary | ICD-10-CM

## 2021-05-10 DIAGNOSIS — C9000 Multiple myeloma not having achieved remission: Secondary | ICD-10-CM

## 2021-05-10 DIAGNOSIS — Z7901 Long term (current) use of anticoagulants: Secondary | ICD-10-CM | POA: Diagnosis not present

## 2021-05-10 DIAGNOSIS — G4733 Obstructive sleep apnea (adult) (pediatric): Secondary | ICD-10-CM

## 2021-05-10 DIAGNOSIS — Z9989 Dependence on other enabling machines and devices: Secondary | ICD-10-CM | POA: Diagnosis not present

## 2021-05-10 DIAGNOSIS — I5032 Chronic diastolic (congestive) heart failure: Secondary | ICD-10-CM | POA: Diagnosis not present

## 2021-05-10 DIAGNOSIS — I350 Nonrheumatic aortic (valve) stenosis: Secondary | ICD-10-CM | POA: Diagnosis not present

## 2021-05-10 NOTE — Patient Instructions (Signed)
Medication Instructions:  Your physician recommends that you continue on your current medications as directed. Please refer to the Current Medication list given to you today.  *If you need a refill on your cardiac medications before your next appointment, please call your pharmacy*   Lab Work: BMET, CBC and Pro BNP today  If you have labs (blood work) drawn today and your tests are completely normal, you will receive your results only by: [place] (if you have MyChart) OR A paper copy in the mail If you have any lab test that is abnormal or we need to change your treatment, we will call you to review the results.   Testing/Procedures: Your physician has requested that you have an echocardiogram. Echocardiography is a painless test that uses sound waves to create images of your heart. It provides your doctor with information about the size and shape of your heart and how well your heart's chambers and valves are working. This procedure takes approximately one hour. There are no restrictions for this procedure.   Follow-Up: At [facility], you and your health needs are our priority.  As part of our continuing mission to provide you with exceptional heart care, we have created designated Provider Care Teams.  These Care Teams include your primary Cardiologist (physician) and Advanced Practice Providers (APPs -  Physician Assistants and Nurse Practitioners) who all work together to provide you with the care you need, when you need it.  We recommend signing up for the patient portal called "MyChart".  Sign up information is provided on this After Visit Summary.  MyChart is used to connect with patients for Virtual Visits (Telemedicine).  Patients are able to view lab/test results, encounter notes, upcoming appointments, etc.  Non-urgent messages can be sent to your provider as well.   To learn more about what you can do with MyChart, go to [email or web address].    Your next  appointment:   6 month(s)  The format for your next appointment:   In Person  Provider:   You may see [name], MD or one of the following Advanced Practice Providers on your designated Care Team:   Cecilie Kicks, NP    Other Instructions

## 2021-05-11 ENCOUNTER — Other Ambulatory Visit: Payer: Self-pay | Admitting: Urology

## 2021-05-11 DIAGNOSIS — C61 Malignant neoplasm of prostate: Secondary | ICD-10-CM

## 2021-05-11 LAB — CBC
Hematocrit: 45.5 % (ref 37.5–51.0)
Hemoglobin: 15.5 g/dL (ref 13.0–17.7)
MCH: 33 pg (ref 26.6–33.0)
MCHC: 34.1 g/dL (ref 31.5–35.7)
MCV: 97 fL (ref 79–97)
Platelets: 173 10*3/uL (ref 150–450)
RBC: 4.7 x10E6/uL (ref 4.14–5.80)
RDW: 14.2 % (ref 11.6–15.4)
WBC: 5.4 10*3/uL (ref 3.4–10.8)

## 2021-05-11 LAB — BASIC METABOLIC PANEL
BUN/Creatinine Ratio: 12 (ref 10–24)
BUN: 17 mg/dL (ref 8–27)
CO2: 22 mmol/L (ref 20–29)
Calcium: 10.3 mg/dL — ABNORMAL HIGH (ref 8.6–10.2)
Chloride: 105 mmol/L (ref 96–106)
Creatinine, Ser: 1.44 mg/dL — ABNORMAL HIGH (ref 0.76–1.27)
Glucose: 86 mg/dL (ref 65–99)
Potassium: 4.3 mmol/L (ref 3.5–5.2)
Sodium: 140 mmol/L (ref 134–144)
eGFR: 48 mL/min/{1.73_m2} — ABNORMAL LOW (ref >59)

## 2021-05-11 LAB — PRO B NATRIURETIC PEPTIDE: NT-Pro BNP: 1171 pg/mL — ABNORMAL HIGH (ref 0–486)

## 2021-05-12 ENCOUNTER — Telehealth: Payer: Self-pay | Admitting: *Deleted

## 2021-05-12 DIAGNOSIS — I5032 Chronic diastolic (congestive) heart failure: Secondary | ICD-10-CM

## 2021-05-12 MED ORDER — POTASSIUM CHLORIDE ER 10 MEQ PO TBCR: 10.0000 meq | EXTENDED_RELEASE_TABLET | Freq: Every day | ORAL | 3 refills | Status: DC

## 2021-05-12 MED ORDER — FUROSEMIDE 20 MG PO TABS: 20.0000 mg | ORAL_TABLET | Freq: Every day | ORAL | 3 refills | Status: DC

## 2021-05-12 NOTE — Telephone Encounter (Signed)
Spoke with pt and reviewed results and recommendations. Pt agreeable to plan.  Will have labs drawn 8/18.

## 2021-05-12 NOTE — Telephone Encounter (Signed)
-----  Message from Belva Crome, MD sent at 05/12/2021  8:06 AM EDT ----- Let the patient know the labs are stable.  BNP is elevated suggesting that PND is related to heart failure.  Start furosemide 20 mg daily and K. Dur 10 mEq daily.  Be met in 1 week. A copy will be sent to [name], MD

## 2021-05-20 ENCOUNTER — Other Ambulatory Visit: Payer: Medicare Other

## 2021-05-20 ENCOUNTER — Other Ambulatory Visit: Payer: Self-pay

## 2021-05-20 DIAGNOSIS — I5032 Chronic diastolic (congestive) heart failure: Secondary | ICD-10-CM | POA: Diagnosis not present

## 2021-05-20 LAB — BASIC METABOLIC PANEL
BUN/Creatinine Ratio: 16 (ref 10–24)
BUN: 26 mg/dL (ref 8–27)
CO2: 22 mmol/L (ref 20–29)
Calcium: 9.6 mg/dL (ref 8.6–10.2)
Chloride: 104 mmol/L (ref 96–106)
Creatinine, Ser: 1.65 mg/dL — ABNORMAL HIGH (ref 0.76–1.27)
Glucose: 138 mg/dL — ABNORMAL HIGH (ref 65–99)
Potassium: 4.1 mmol/L (ref 3.5–5.2)
Sodium: 139 mmol/L (ref 134–144)
eGFR: 41 mL/min/{1.73_m2} — ABNORMAL LOW (ref >59)

## 2021-05-24 ENCOUNTER — Telehealth: Payer: Self-pay | Admitting: Interventional Cardiology

## 2021-05-24 DIAGNOSIS — I5032 Chronic diastolic (congestive) heart failure: Secondary | ICD-10-CM

## 2021-05-24 NOTE — Telephone Encounter (Signed)
Pt c/o medication issue:  1. Name of Medication: Potassium Chloride and Furosemide not sure which one is causing the problem  2. How are you currently taking this medication (dosage and times per day)? 1 time a day at noon  3. Are you having a reaction (difficulty breathing--STAT)? A little  4. What is your medication issue? Rash on chest, and  sternum, it is red and itching

## 2021-05-24 NOTE — Telephone Encounter (Signed)
Pt was started on Furosemide '20mg'$  QD and KDUR 11mq QD on 05/12/21 after elevated BNP noted.  Pt states over the last several days he has noticed a rash starting to develop over his chest.  Didn't pay it much attention at first because it was mild but has worsened and has become very itchy the last two days.  Also has developed diarrhea.  Advised pt I will send to Dr. STamala Julianfor review and advisement.  Advised ok to hold medication for now and see if rash starts to clear.  Pt did mention that he took  Furosemide '40mg'$  QD for a week back in 2017 and had no issues.  He was unsure why he came off of it at that time.  I was unable to locate where anyone told him to stop it.

## 2021-05-26 NOTE — Telephone Encounter (Signed)
Is rash better on Furosemide? Did breathing get better when on furosemide? Was he on furosemide when blood work was repeated?

## 2021-05-27 NOTE — Telephone Encounter (Signed)
Called pt and reviewed lab results.  Pt was on Furosemide when he had the labs drawn.  Rash has gotten better since holding Furosemide x 2 days.  Breathing is better since starting Furosemide.  Pt is scheduled for echo tomorrow.  Advised I will send message to Dr. Tamala Julian to review.

## 2021-05-27 NOTE — Telephone Encounter (Signed)
Left message to call back  

## 2021-05-28 ENCOUNTER — Other Ambulatory Visit: Payer: Self-pay

## 2021-05-28 ENCOUNTER — Ambulatory Visit (HOSPITAL_COMMUNITY): Payer: Medicare Other | Attending: Internal Medicine

## 2021-05-28 DIAGNOSIS — I5032 Chronic diastolic (congestive) heart failure: Secondary | ICD-10-CM | POA: Diagnosis not present

## 2021-05-28 LAB — ECHOCARDIOGRAM COMPLETE
AR max vel: 1.08 cm2
AV Area VTI: 0.99 cm2
AV Area mean vel: 1 cm2
AV Mean grad: 17.8 mmHg
AV Peak grad: 29.8 mmHg
Ao pk vel: 2.73 m/s
Area-P 1/2: 2.87 cm2
P 1/2 time: 406 msec
S' Lateral: 3.4 cm

## 2021-05-28 MED ORDER — HYDROCHLOROTHIAZIDE 12.5 MG PO CAPS: 12.5000 mg | ORAL_CAPSULE | Freq: Every day | ORAL | 3 refills | Status: DC

## 2021-05-28 NOTE — Telephone Encounter (Signed)
Needs diuretic therapy. Start HCTZ 12.5 mg daily. BMET 1 week after starting.  ----- Message -----  From: Loren Racer, RN  Sent: 05/27/2021  10:40 AM EDT  To: Belva Crome, MD, Loren Racer, RN    Spoke with pt and made him aware of recommendations per Dr. Tamala Julian. Pt agreeable to plan.  He will come for labs on 9/2.

## 2021-06-01 DIAGNOSIS — M1711 Unilateral primary osteoarthritis, right knee: Secondary | ICD-10-CM | POA: Diagnosis not present

## 2021-06-01 DIAGNOSIS — G459 Transient cerebral ischemic attack, unspecified: Secondary | ICD-10-CM | POA: Diagnosis not present

## 2021-06-01 DIAGNOSIS — E032 Hypothyroidism due to medicaments and other exogenous substances: Secondary | ICD-10-CM | POA: Diagnosis not present

## 2021-06-01 DIAGNOSIS — I480 Paroxysmal atrial fibrillation: Secondary | ICD-10-CM | POA: Diagnosis not present

## 2021-06-01 DIAGNOSIS — E785 Hyperlipidemia, unspecified: Secondary | ICD-10-CM | POA: Diagnosis not present

## 2021-06-01 DIAGNOSIS — N183 Chronic kidney disease, stage 3 unspecified: Secondary | ICD-10-CM | POA: Diagnosis not present

## 2021-06-01 DIAGNOSIS — I129 Hypertensive chronic kidney disease with stage 1 through stage 4 chronic kidney disease, or unspecified chronic kidney disease: Secondary | ICD-10-CM | POA: Diagnosis not present

## 2021-06-03 ENCOUNTER — Other Ambulatory Visit: Payer: Self-pay | Admitting: Interventional Cardiology

## 2021-06-03 DIAGNOSIS — I480 Paroxysmal atrial fibrillation: Secondary | ICD-10-CM

## 2021-06-03 NOTE — Telephone Encounter (Signed)
Prescription refill request for Eliquis received.  Indication: afib  Last office visit: Tamala Julian 05/10/2021 Scr: 1.65, 05/20/2021 Age: 86 yo  Weight: 75 kg   Refill sent.

## 2021-06-04 ENCOUNTER — Other Ambulatory Visit: Payer: Medicare Other | Admitting: *Deleted

## 2021-06-04 ENCOUNTER — Other Ambulatory Visit: Payer: Self-pay

## 2021-06-04 DIAGNOSIS — I5032 Chronic diastolic (congestive) heart failure: Secondary | ICD-10-CM | POA: Diagnosis not present

## 2021-06-05 LAB — BASIC METABOLIC PANEL
BUN/Creatinine Ratio: 13 (ref 10–24)
BUN: 21 mg/dL (ref 8–27)
CO2: 25 mmol/L (ref 20–29)
Calcium: 10.1 mg/dL (ref 8.6–10.2)
Chloride: 101 mmol/L (ref 96–106)
Creatinine, Ser: 1.56 mg/dL — ABNORMAL HIGH (ref 0.76–1.27)
Glucose: 94 mg/dL (ref 65–99)
Potassium: 4 mmol/L (ref 3.5–5.2)
Sodium: 139 mmol/L (ref 134–144)
eGFR: 44 mL/min/{1.73_m2} — ABNORMAL LOW (ref >59)

## 2021-06-14 DIAGNOSIS — H35372 Puckering of macula, left eye: Secondary | ICD-10-CM | POA: Diagnosis not present

## 2021-06-14 DIAGNOSIS — H04123 Dry eye syndrome of bilateral lacrimal glands: Secondary | ICD-10-CM | POA: Diagnosis not present

## 2021-06-14 DIAGNOSIS — H401132 Primary open-angle glaucoma, bilateral, moderate stage: Secondary | ICD-10-CM | POA: Diagnosis not present

## 2021-06-14 DIAGNOSIS — I10 Essential (primary) hypertension: Secondary | ICD-10-CM | POA: Diagnosis not present

## 2021-06-17 ENCOUNTER — Inpatient Hospital Stay: Payer: Medicare Other | Attending: Internal Medicine

## 2021-06-17 ENCOUNTER — Other Ambulatory Visit: Payer: Self-pay

## 2021-06-17 DIAGNOSIS — Z9079 Acquired absence of other genital organ(s): Secondary | ICD-10-CM | POA: Insufficient documentation

## 2021-06-17 DIAGNOSIS — Z886 Allergy status to analgesic agent status: Secondary | ICD-10-CM | POA: Insufficient documentation

## 2021-06-17 DIAGNOSIS — G4733 Obstructive sleep apnea (adult) (pediatric): Secondary | ICD-10-CM | POA: Insufficient documentation

## 2021-06-17 DIAGNOSIS — Z888 Allergy status to other drugs, medicaments and biological substances status: Secondary | ICD-10-CM | POA: Insufficient documentation

## 2021-06-17 DIAGNOSIS — Z9049 Acquired absence of other specified parts of digestive tract: Secondary | ICD-10-CM | POA: Insufficient documentation

## 2021-06-17 DIAGNOSIS — Z923 Personal history of irradiation: Secondary | ICD-10-CM | POA: Diagnosis not present

## 2021-06-17 DIAGNOSIS — Z79899 Other long term (current) drug therapy: Secondary | ICD-10-CM | POA: Diagnosis not present

## 2021-06-17 DIAGNOSIS — Z881 Allergy status to other antibiotic agents status: Secondary | ICD-10-CM | POA: Diagnosis not present

## 2021-06-17 DIAGNOSIS — I480 Paroxysmal atrial fibrillation: Secondary | ICD-10-CM | POA: Insufficient documentation

## 2021-06-17 DIAGNOSIS — Z880 Allergy status to penicillin: Secondary | ICD-10-CM | POA: Diagnosis not present

## 2021-06-17 DIAGNOSIS — R5383 Other fatigue: Secondary | ICD-10-CM | POA: Diagnosis not present

## 2021-06-17 DIAGNOSIS — C9000 Multiple myeloma not having achieved remission: Secondary | ICD-10-CM | POA: Diagnosis not present

## 2021-06-17 DIAGNOSIS — M2550 Pain in unspecified joint: Secondary | ICD-10-CM | POA: Insufficient documentation

## 2021-06-17 DIAGNOSIS — C61 Malignant neoplasm of prostate: Secondary | ICD-10-CM | POA: Insufficient documentation

## 2021-06-17 DIAGNOSIS — Z885 Allergy status to narcotic agent status: Secondary | ICD-10-CM | POA: Insufficient documentation

## 2021-06-17 DIAGNOSIS — Z7901 Long term (current) use of anticoagulants: Secondary | ICD-10-CM | POA: Insufficient documentation

## 2021-06-17 DIAGNOSIS — I5032 Chronic diastolic (congestive) heart failure: Secondary | ICD-10-CM | POA: Insufficient documentation

## 2021-06-17 LAB — CMP (CANCER CENTER ONLY)
ALT: 14 U/L (ref 0–44)
AST: 20 U/L (ref 15–41)
Albumin: 4 g/dL (ref 3.5–5.0)
Alkaline Phosphatase: 103 U/L (ref 38–126)
Anion gap: 7 (ref 5–15)
BUN: 25 mg/dL — ABNORMAL HIGH (ref 8–23)
CO2: 27 mmol/L (ref 22–32)
Calcium: 10.5 mg/dL — ABNORMAL HIGH (ref 8.9–10.3)
Chloride: 106 mmol/L (ref 98–111)
Creatinine: 1.58 mg/dL — ABNORMAL HIGH (ref 0.61–1.24)
GFR, Estimated: 43 mL/min — ABNORMAL LOW (ref >60)
Glucose, Bld: 102 mg/dL — ABNORMAL HIGH (ref 70–99)
Potassium: 3.7 mmol/L (ref 3.5–5.1)
Sodium: 140 mmol/L (ref 135–145)
Total Bilirubin: 0.9 mg/dL (ref 0.3–1.2)
Total Protein: 6.7 g/dL (ref 6.5–8.1)

## 2021-06-17 LAB — CBC WITH DIFFERENTIAL (CANCER CENTER ONLY)
Abs Immature Granulocytes: 0.04 10*3/uL (ref 0.00–0.07)
Basophils Absolute: 0 10*3/uL (ref 0.0–0.1)
Basophils Relative: 0 %
Eosinophils Absolute: 0.1 10*3/uL (ref 0.0–0.5)
Eosinophils Relative: 1 %
HCT: 42.6 % (ref 39.0–52.0)
Hemoglobin: 15 g/dL (ref 13.0–17.0)
Immature Granulocytes: 1 %
Lymphocytes Relative: 19 %
Lymphs Abs: 0.9 10*3/uL (ref 0.7–4.0)
MCH: 33.1 pg (ref 26.0–34.0)
MCHC: 35.2 g/dL (ref 30.0–36.0)
MCV: 94 fL (ref 80.0–100.0)
Monocytes Absolute: 0.6 10*3/uL (ref 0.1–1.0)
Monocytes Relative: 12 %
Neutro Abs: 3.1 10*3/uL (ref 1.7–7.7)
Neutrophils Relative %: 67 %
Platelet Count: 145 10*3/uL — ABNORMAL LOW (ref 150–400)
RBC: 4.53 MIL/uL (ref 4.22–5.81)
RDW: 13.7 % (ref 11.5–15.5)
WBC Count: 4.7 10*3/uL (ref 4.0–10.5)
nRBC: 0 % (ref 0.0–0.2)

## 2021-06-17 LAB — LACTATE DEHYDROGENASE: LDH: 203 U/L — ABNORMAL HIGH (ref 98–192)

## 2021-06-18 LAB — KAPPA/LAMBDA LIGHT CHAINS
Kappa free light chain: 66.6 mg/L — ABNORMAL HIGH (ref 3.3–19.4)
Kappa, lambda light chain ratio: 4.22 — ABNORMAL HIGH (ref 0.26–1.65)
Lambda free light chains: 15.8 mg/L (ref 5.7–26.3)

## 2021-06-18 LAB — IGG, IGA, IGM
IgA: 73 mg/dL (ref 61–437)
IgG (Immunoglobin G), Serum: 1021 mg/dL (ref 603–1613)
IgM (Immunoglobulin M), Srm: 40 mg/dL (ref 15–143)

## 2021-06-18 LAB — BETA 2 MICROGLOBULIN, SERUM: Beta-2 Microglobulin: 2.7 mg/L — ABNORMAL HIGH (ref 0.6–2.4)

## 2021-06-24 ENCOUNTER — Inpatient Hospital Stay: Payer: Medicare Other | Admitting: Internal Medicine

## 2021-06-24 ENCOUNTER — Other Ambulatory Visit: Payer: Self-pay

## 2021-06-24 VITALS — BP 126/76 | HR 86 | Temp 97.2°F | Resp 18 | Wt 160.2 lb

## 2021-06-24 DIAGNOSIS — R5383 Other fatigue: Secondary | ICD-10-CM | POA: Diagnosis not present

## 2021-06-24 DIAGNOSIS — Z880 Allergy status to penicillin: Secondary | ICD-10-CM | POA: Diagnosis not present

## 2021-06-24 DIAGNOSIS — C9000 Multiple myeloma not having achieved remission: Secondary | ICD-10-CM

## 2021-06-24 DIAGNOSIS — Z923 Personal history of irradiation: Secondary | ICD-10-CM | POA: Diagnosis not present

## 2021-06-24 DIAGNOSIS — Z885 Allergy status to narcotic agent status: Secondary | ICD-10-CM | POA: Diagnosis not present

## 2021-06-24 DIAGNOSIS — Z79899 Other long term (current) drug therapy: Secondary | ICD-10-CM | POA: Diagnosis not present

## 2021-06-24 DIAGNOSIS — Z888 Allergy status to other drugs, medicaments and biological substances status: Secondary | ICD-10-CM | POA: Diagnosis not present

## 2021-06-24 DIAGNOSIS — I480 Paroxysmal atrial fibrillation: Secondary | ICD-10-CM | POA: Diagnosis not present

## 2021-06-24 DIAGNOSIS — Z886 Allergy status to analgesic agent status: Secondary | ICD-10-CM | POA: Diagnosis not present

## 2021-06-24 DIAGNOSIS — Z881 Allergy status to other antibiotic agents status: Secondary | ICD-10-CM | POA: Diagnosis not present

## 2021-06-24 DIAGNOSIS — I5032 Chronic diastolic (congestive) heart failure: Secondary | ICD-10-CM | POA: Diagnosis not present

## 2021-06-24 DIAGNOSIS — M2550 Pain in unspecified joint: Secondary | ICD-10-CM | POA: Diagnosis not present

## 2021-06-24 DIAGNOSIS — Z9049 Acquired absence of other specified parts of digestive tract: Secondary | ICD-10-CM | POA: Diagnosis not present

## 2021-06-24 DIAGNOSIS — G4733 Obstructive sleep apnea (adult) (pediatric): Secondary | ICD-10-CM | POA: Diagnosis not present

## 2021-06-24 DIAGNOSIS — Z7901 Long term (current) use of anticoagulants: Secondary | ICD-10-CM | POA: Diagnosis not present

## 2021-06-24 NOTE — Progress Notes (Signed)
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 2) History of pheochromocytoma LEFT adrenal gland status post resection on 03/16/2009 3) Prostate adenocarcinoma  PRIOR THERAPY:  1)Systemic therapy with weekly Velcade 1.3 mg/KG subcutaneously, Revlimid 25 mg p.o. daily for 21 days every 4 weeks as well as Decadron 20 mg p.o. weekly.  First dose November 07, 2017.  Status post 7 cycles. 2) definitive radiotherapy to the prostate cancer under the care of Dr. Tammi Klippel.  CURRENT THERAPY: Observation.  INTERVAL HISTORY: Thomas [name] 86 y.o. male returns to the clinic today for 82-monthfollow-up visit.  The patient is feeling fine today with no concerning complaints except for mild fatigue.  He denied having any current chest pain, shortness of breath, cough or hemoptysis.  He denied having any nausea, vomiting, diarrhea or constipation.  He has no headache or visual changes.  He has no weight loss or night sweats.  He completed definitive radiotherapy to the prostate adenocarcinoma under the care of Dr. MTammi Klippel  The patient was seen also for evaluation by Dr. SAlen Blewand he recommended for him to continue on the hormonal therapy with adding abiraterone.  He is followed by Dr. BAlinda Moneyfor his hormonal therapy.  The patient is here today for evaluation with repeat myeloma panel.  MEDICAL HISTORY: Past Medical History:  Diagnosis Date   BPH (benign prostatic hypertrophy)    Chronic diastolic heart failure ([facility] 12/27/2013   Chronic kidney disease    Hx of bilateral renal cysts.   Encounter for antineoplastic chemotherapy 02/20/2018   Glaucoma    Heart murmur    History of atrial fibrillation 03/09/09   Hx of epiglottitis    2011    Hypercholesterolemia    Hypertension    Long term current use of amiodarone 11/07/2013   Amiodarone    Mild aortic stenosis    OSA on CPAP 06/26/2014   Paroxysmal atrial fibrillation ([facility] 11/07/2013   Prostate cancer ([facility] 7/12   T1C Ad Ca  s/p TURP, observation only   Renal insufficiency 09/06/2011   Sinus node dysfunction ([facility] 08/14/2014   Sleep apnea    Smoldering multiple myeloma (H[facility])     ALLERGIES:  is allergic to benadryl [diphenhydramine hcl], ceftriaxone sodium, contrast media [iodinated diagnostic agents], nsaids, penicillins, codeine, ibuprofen, levaquin [levofloxacin], oxybutynin, tramadol hcl, and chlorhexidine.  MEDICATIONS:  Current Outpatient Medications  Medication Sig Dispense Refill   acetaminophen (TYLENOL) 500 MG tablet Take 1,000 mg by mouth as needed.      apixaban (ELIQUIS) 2.5 MG TABS tablet TAKE ONE TABLET BY MOUTH TWICE DAILY 90 tablet 1   brimonidine-timolol (COMBIGAN) 0.2-0.5 % ophthalmic solution Place 1 drop into both eyes every 12 (twelve) hours.     Cholecalciferol (VITAMIN D3) 50 MCG (2000 UT) capsule Take 1 capsule by mouth daily.      diltiazem (CARDIZEM CD) 120 MG 24 hr capsule TAKE ONE CAPSULE BY MOUTH ONCE DAILY 90 capsule 1   hydrochlorothiazide (MICROZIDE) 12.5 MG capsule Take 1 capsule (12.5 mg total) by mouth daily. 90 capsule 3   latanoprost (XALATAN) 0.005 % ophthalmic solution INSTILL 1 DROP INTO BOTH EYES EVERY DAY AT NIGHT  6   potassium chloride (KLOR-CON) 10 MEQ tablet Take 1 tablet (10 mEq total)  by mouth daily. 90 tablet 3   tamsulosin (FLOMAX) 0.4 MG CAPS capsule Take 1 capsule (0.4 mg total) by mouth daily after supper. 30 capsule 5   No current facility-administered medications for this visit.    SURGICAL HISTORY:  Past Surgical History:  Procedure Laterality Date   ADRENALECTOMY  02/12/11   Left, for Pheochromocytoma   CARDIOVERSION N/A 12/26/2013   Procedure: CARDIOVERSION;  Surgeon: [name], MD;  Location: [facility];   Service: Cardiovascular;  Laterality: N/A;   CHOLECYSTECTOMY  02/12/11   GASTRECTOMY  1984   S/P gastrectomy for ulcer   KNEE SURGERY  1971   S/P Right knee surgery   TEE WITHOUT CARDIOVERSION N/A 12/26/2013   Procedure: TRANSESOPHAGEAL ECHOCARDIOGRAM (TEE);  Surgeon: [name], MD;  Location: [facility];  Service: Cardiovascular;  Laterality: N/A;   TRANSURETHRAL RESECTION OF PROSTATE  7/12    REVIEW OF SYSTEMS:  A comprehensive review of systems was negative except for: Constitutional: positive for fatigue Musculoskeletal: positive for arthralgias   PHYSICAL EXAMINATION: General appearance: alert, cooperative and no distress Head: Normocephalic, without obvious abnormality, atraumatic Neck: no adenopathy, no JVD, supple, symmetrical, trachea midline and thyroid not enlarged, symmetric, no tenderness/mass/nodules Lymph nodes: Cervical, supraclavicular, and axillary nodes normal. Resp: clear to auscultation bilaterally Back: symmetric, no curvature. ROM normal. No CVA tenderness. Cardio: regular rate and rhythm, S1, S2 normal, no murmur, click, rub or gallop GI: soft, non-tender; bowel sounds normal; no masses,  no organomegaly Extremities: extremities normal, atraumatic, no cyanosis or edema  ECOG PERFORMANCE STATUS: 1 - Symptomatic but completely ambulatory  Blood pressure 126/76, pulse 86, temperature (!) 97.2 F (36.2 C), temperature source Oral, resp. rate 18, weight 160 lb 3 oz (72.7 kg), SpO2 100 %.  LABORATORY DATA: Lab Results  Component Value Date   WBC 4.7 06/17/2021   HGB 15.0 06/17/2021   HCT 42.6 06/17/2021   MCV 94.0 06/17/2021   PLT 145 (L) 06/17/2021      Chemistry      Component Value Date/Time   NA 140 06/17/2021 1038   NA 139 06/04/2021 1041   NA 138 08/16/2017 1404   K 3.7 06/17/2021 1038   K 4.5 08/16/2017 1404   CL 106 06/17/2021 1038   CL 109 (H) 03/21/2013 0958   CO2 27 06/17/2021 1038   CO2 24 08/16/2017 1404   BUN 25 (H) 06/17/2021 1038    BUN 21 06/04/2021 1041   BUN 17.3 08/16/2017 1404   CREATININE 1.58 (H) 06/17/2021 1038   CREATININE 1.8 (H) 08/16/2017 1404      Component Value Date/Time   CALCIUM 10.5 (H) 06/17/2021 1038   CALCIUM 9.9 08/16/2017 1404   ALKPHOS 103 06/17/2021 1038   ALKPHOS 86 08/16/2017 1404   AST 20 06/17/2021 1038   AST 22 08/16/2017 1404   ALT 14 06/17/2021 1038   ALT 16 08/16/2017 1404   BILITOT 0.9 06/17/2021 1038   BILITOT 0.78 08/16/2017 1404       RADIOGRAPHIC STUDIES: ECHOCARDIOGRAM COMPLETE  Result Date: 05/28/2021    ECHOCARDIOGRAM REPORT   Patient Name:   KIMBALL APPLEBY Date of Exam: 05/28/2021 Medical Rec #:  [id number]         Height:       65.0 in Accession #:    [id number]        Weight:       165.4 lb Date of Birth:  06/21/1935         BSA:  1.825 m Patient Age:    54 years          BP:           118/62 mmHg Patient Gender: M                 HR:           70 bpm. Exam Location:  [address] Procedure: 2D Echo, Cardiac Doppler and Color Doppler Indications:    I50.9 Congestive heart failure  History:        Patient has prior history of Echocardiogram examinations, most                 recent 02/25/2020. Risk Factors:Hypertension and Dyslipidemia.                 Obstructive sleep apnea-CPAP. Chronic kidney disease. Murmur.                 Atrial Fibrillation.  Sonographer:    Cresenciano Lick RDCS Referring Phys: [place]  1. Left ventricular ejection fraction, by estimation, is 55 to 60%. The left ventricle has normal function. The left ventricle has no regional wall motion abnormalities. There is mild left ventricular hypertrophy. Left ventricular diastolic parameters are indeterminate.  2. Right ventricular systolic function is normal. The right ventricular size is normal.  3. Left atrial size was mildly dilated.  4. Mild mitral valve regurgitation.  5. AV is thickened, calcified with restricted motion Peak and mean gradients through the valve are  38 and 19 mm Hg respectively AVA (VTI) is 0.99cm2. Dimensionless index is 0.26 consistent with low gradient severe AS. Compared to echo from 2021, mild increase in mean gradient (15 to 19 mmHg) and dimensionless index is decreased (0.31 to 0.26). Aortic valve regurgitation is mild to moderate.  6. Aortic dilatation noted. There is mild dilatation of the ascending aorta, measuring 41 mm. FINDINGS  Left Ventricle: Left ventricular ejection fraction, by estimation, is 55 to 60%. The left ventricle has normal function. The left ventricle has no regional wall motion abnormalities. The left ventricular internal cavity size was normal in size. There is  mild left ventricular hypertrophy. Left ventricular diastolic parameters are indeterminate. Right Ventricle: The right ventricular size is normal. Right vetricular wall thickness was not assessed. Right ventricular systolic function is normal. Left Atrium: Left atrial size was mildly dilated. Right Atrium: Right atrial size was normal in size. Pericardium: There is no evidence of pericardial effusion. Mitral Valve: There is mild thickening of the mitral valve leaflet(s). Mild mitral annular calcification. Mild mitral valve regurgitation. Tricuspid Valve: The tricuspid valve is normal in structure. Tricuspid valve regurgitation is mild. Aortic Valve: AV is thickened, calcified with restricted motion Peak and mean gradients through the valve are 38 and 19 mm Hg respectively AVA (VTI) is 0.99cm2. Dimensionless index is 0.26 consistent with low gradient severe AS. Compared to echo from 2021, mild increase in mean gradient (15 to 19 mmHg) and dimensionless index is decreased (0.31 to 0.26). Aortic valve regurgitation is mild to moderate. Aortic regurgitation PHT measures 406 msec. Aortic valve mean gradient measures 17.8 mmHg. Aortic valve peak gradient measures 29.8 mmHg. Aortic valve area, by VTI measures 0.99 cm. Pulmonic Valve: The pulmonic valve was not well visualized.  Pulmonic valve regurgitation is not visualized. Aorta: Aortic dilatation noted and the aortic root is normal in size and structure. There is mild dilatation of the ascending aorta, measuring 41 mm. IAS/Shunts: No atrial level shunt  detected by color flow Doppler.  LEFT VENTRICLE PLAX 2D LVIDd:         4.20 cm  Diastology LVIDs:         3.40 cm  LV e' medial:    7.83 cm/s LV PW:         1.00 cm  LV E/e' medial:  14.4 LV IVS:        1.20 cm  LV e' lateral:   8.70 cm/s LVOT diam:     2.20 cm  LV E/e' lateral: 13.0 LV SV:         56 LV SV Index:   31 LVOT Area:     3.80 cm  RIGHT VENTRICLE RV Basal diam:  4.40 cm RV S prime:     12.30 cm/s TAPSE (M-mode): 1.7 cm LEFT ATRIUM             Index       RIGHT ATRIUM           Index LA diam:        5.50 cm 3.01 cm/m  RA Area:     14.50 cm LA Vol (A2C):   73.4 ml 40.23 ml/m RA Volume:   38.70 ml  21.21 ml/m LA Vol (A4C):   53.4 ml 29.26 ml/m LA Biplane Vol: 65.5 ml 35.90 ml/m  AORTIC VALVE AV Area (Vmax):    1.08 cm AV Area (Vmean):   1.00 cm AV Area (VTI):     0.99 cm AV Vmax:           273.00 cm/s AV Vmean:          198.000 cm/s AV VTI:            0.563 m AV Peak Grad:      29.8 mmHg AV Mean Grad:      17.8 mmHg LVOT Vmax:         77.47 cm/s LVOT Vmean:        52.067 cm/s LVOT VTI:          0.147 m LVOT/AV VTI ratio: 0.26 AI PHT:            406 msec  AORTA Ao Root diam: 4.30 cm Ao Asc diam:  4.10 cm MITRAL VALVE                TRICUSPID VALVE MV Area (PHT): 2.87 cm     TR Peak grad:   19.2 mmHg MV Decel Time: 264 msec     TR Vmax:        219.00 cm/s MV E velocity: 113.00 cm/s MV A velocity: 51.90 cm/s   SHUNTS MV E/A ratio:  2.18         Systemic VTI:  0.15 m                             Systemic Diam: 2.20 cm [name] MD Electronically signed by [name] MD Signature Date/Time: 05/28/2021/8:48:14 PM    Final     ASSESSMENT AND PLAN:  This is a very pleasant 86 years old white male with IgG smoldering [place] diagnosed in April 2011. The patient has been on  observation since 2011. His recent myeloma panel showed further increase in the free kappa light chain. His recent skeletal bone survey showed no concerning findings for disease progression.  He has a focus of avascular necrosis but no significant lytic lesions. His bone marrow bone marrow  biopsy and aspirate showed further increase in the plasma cells to 15%. The patient underwent treatment with subcutaneous weekly Velcade as well as Revlimid and Decadron status post 7 cycles. Is currently on observation and doing fine with no concerning complaints except for mild fatigue and arthralgia. Repeat myeloma panel performed recently showed no concerning findings except for further increase in the free kappa light chain. I recommended for the patient to continue on observation but I will repeat the myeloma panel sooner in 3 months to rule out any further disease progression. For the history of prostate adenocarcinoma, he underwent definitive radiotherapy and he is currently followed by Dr. Alinda Money and Dr. Alen Blew for the hormonal therapy. The patient was advised to call immediately if he has any other concerning symptoms in the interval.  The patient voices understanding of current disease status and treatment options and is in agreement with the current care plan. All questions were answered. The patient knows to call the clinic with any problems, questions or concerns. We can certainly see the patient much sooner if necessary.  Disclaimer: This note was dictated with voice recognition software. Similar sounding words can inadvertently be transcribed and may not be corrected upon review.

## 2021-06-29 DIAGNOSIS — I129 Hypertensive chronic kidney disease with stage 1 through stage 4 chronic kidney disease, or unspecified chronic kidney disease: Secondary | ICD-10-CM | POA: Diagnosis not present

## 2021-06-29 DIAGNOSIS — N183 Chronic kidney disease, stage 3 unspecified: Secondary | ICD-10-CM | POA: Diagnosis not present

## 2021-06-29 DIAGNOSIS — I480 Paroxysmal atrial fibrillation: Secondary | ICD-10-CM | POA: Diagnosis not present

## 2021-06-29 DIAGNOSIS — E785 Hyperlipidemia, unspecified: Secondary | ICD-10-CM | POA: Diagnosis not present

## 2021-06-29 DIAGNOSIS — G459 Transient cerebral ischemic attack, unspecified: Secondary | ICD-10-CM | POA: Diagnosis not present

## 2021-06-29 DIAGNOSIS — M1711 Unilateral primary osteoarthritis, right knee: Secondary | ICD-10-CM | POA: Diagnosis not present

## 2021-06-29 DIAGNOSIS — E032 Hypothyroidism due to medicaments and other exogenous substances: Secondary | ICD-10-CM | POA: Diagnosis not present

## 2021-07-02 DIAGNOSIS — R350 Frequency of micturition: Secondary | ICD-10-CM | POA: Diagnosis not present

## 2021-07-26 DIAGNOSIS — I480 Paroxysmal atrial fibrillation: Secondary | ICD-10-CM | POA: Diagnosis not present

## 2021-07-26 DIAGNOSIS — M545 Low back pain, unspecified: Secondary | ICD-10-CM | POA: Diagnosis not present

## 2021-07-26 DIAGNOSIS — M25561 Pain in right knee: Secondary | ICD-10-CM | POA: Diagnosis not present

## 2021-07-26 DIAGNOSIS — I129 Hypertensive chronic kidney disease with stage 1 through stage 4 chronic kidney disease, or unspecified chronic kidney disease: Secondary | ICD-10-CM | POA: Diagnosis not present

## 2021-07-26 DIAGNOSIS — N183 Chronic kidney disease, stage 3 unspecified: Secondary | ICD-10-CM | POA: Diagnosis not present

## 2021-07-26 DIAGNOSIS — C9000 Multiple myeloma not having achieved remission: Secondary | ICD-10-CM | POA: Diagnosis not present

## 2021-07-26 DIAGNOSIS — E785 Hyperlipidemia, unspecified: Secondary | ICD-10-CM | POA: Diagnosis not present

## 2021-07-26 DIAGNOSIS — Z23 Encounter for immunization: Secondary | ICD-10-CM | POA: Diagnosis not present

## 2021-07-26 DIAGNOSIS — M109 Gout, unspecified: Secondary | ICD-10-CM | POA: Diagnosis not present

## 2021-07-26 DIAGNOSIS — Z0000 Encounter for general adult medical examination without abnormal findings: Secondary | ICD-10-CM | POA: Diagnosis not present

## 2021-07-27 ENCOUNTER — Other Ambulatory Visit: Payer: Self-pay | Admitting: Radiation Oncology

## 2021-08-03 DIAGNOSIS — M170 Bilateral primary osteoarthritis of knee: Secondary | ICD-10-CM | POA: Diagnosis not present

## 2021-08-13 DIAGNOSIS — G4733 Obstructive sleep apnea (adult) (pediatric): Secondary | ICD-10-CM | POA: Diagnosis not present

## 2021-08-16 ENCOUNTER — Other Ambulatory Visit: Payer: Self-pay | Admitting: Interventional Cardiology

## 2021-08-16 DIAGNOSIS — I480 Paroxysmal atrial fibrillation: Secondary | ICD-10-CM

## 2021-08-16 NOTE — Telephone Encounter (Signed)
Prescription refill request for Eliquis received. Indication: Afib  Last office visit: 05/10/21 Tamala Julian)  Scr: 1.58 (06/17/21)  Age: 86 Weight: 72.7kg  Appropriate dose and refill sent to requested pharmacy.

## 2021-08-23 IMAGING — PT NM PET TUM IMG SKULL BASE T - THIGH
1 series · 1 of 1 positions shown · non-contrast
Comparison: None.
COMPARISON: None.

Addendum:
CLINICAL DATA: Prostate carcinoma with biochemical recurrence.

EXAM:
NUCLEAR MEDICINE PET SKULL BASE TO THIGH
TECHNIQUE: 9.5 mCi F18 Piflufolastat (Pylarify) was injected intravenously.
Full-ring PET imaging was performed from the skull base to thigh
after the radiotracer. CT data was obtained and used for attenuation
correction and anatomic localization.

[Series 1090: results mm oncology reading · 1.3mm · 0.81mm/px · 1 of 1 slices shown]
[im 1/1]
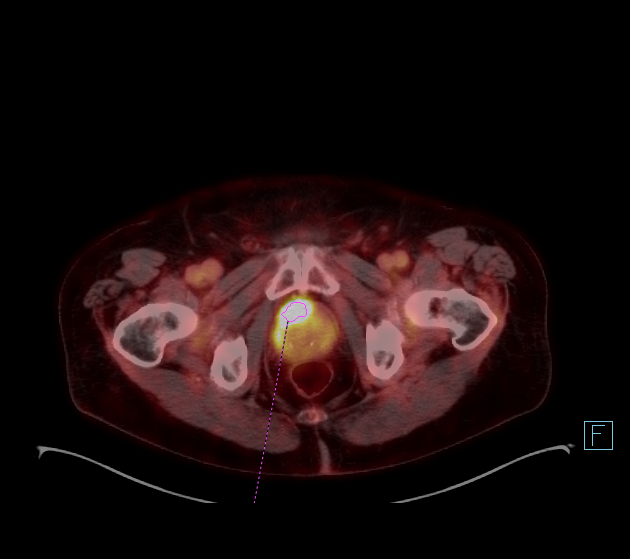

[1 of 1 positions shown; findings below may reference images not displayed]

FINDINGS: NECK

No radiotracer activity in neck lymph nodes.

Incidental CT finding: None

CHEST

No radiotracer accumulation within mediastinal or hilar lymph nodes.
No suspicious pulmonary nodules on the CT scan.

Incidental CT finding: None

ABDOMEN/PELVIS

Prostate: Intense radiotracer activity anteriorly within the
prostate gland as well within LEFT and RIGHT gland localizing to the
peripheral zone. Activity intense with SUV max equal

Large TURP defect noted centrally within the prostate.

Lymph nodes: No abnormal radiotracer accumulation within pelvic or
abdominal nodes.

Liver: No evidence of liver metastasis

Incidental CT finding: Atherosclerotic calcification of the aorta.
Bilateral renal cortical thinning.

SKELETON

No focal  activity to suggest skeletal metastasis.
IMPRESSION: 1. Intense activity within the prostate gland consistent with
primary prostate carcinoma.
2. No evidence of nodal metastasis in the pelvis or periaortic
retroperitoneum.
3. No visceral metastasis or skeletal

ADDENDUM:
The activity anterior midline int prostate gland is very intense
(SUV max equal 19) favored to represent urine collecting within and
anterior TURP defect.

There is activity in the RIGHT lateral mid gland and LEFT apex which
is also intense but to lesser degree (SUV max equal 6) which is
favored prostate adenocarcinoma.

*** End of Addendum ***
FINDINGS: NECK

No radiotracer activity in neck lymph nodes.

Incidental CT finding: None

CHEST

No radiotracer accumulation within mediastinal or hilar lymph nodes.
No suspicious pulmonary nodules on the CT scan.

Incidental CT finding: None

ABDOMEN/PELVIS

Prostate: Intense radiotracer activity anteriorly within the
prostate gland as well within LEFT and RIGHT gland localizing to the
peripheral zone. Activity intense with SUV max equal

Large TURP defect noted centrally within the prostate.

Lymph nodes: No abnormal radiotracer accumulation within pelvic or
abdominal nodes.

Liver: No evidence of liver metastasis

Incidental CT finding: Atherosclerotic calcification of the aorta.
Bilateral renal cortical thinning.

SKELETON

No focal  activity to suggest skeletal metastasis.
IMPRESSION: 1. Intense activity within the prostate gland consistent with
primary prostate carcinoma.
2. No evidence of nodal metastasis in the pelvis or periaortic
retroperitoneum.
3. No visceral metastasis or skeletal

## 2021-08-31 ENCOUNTER — Encounter: Payer: Self-pay | Admitting: Cardiovascular Disease

## 2021-08-31 ENCOUNTER — Other Ambulatory Visit: Payer: Self-pay

## 2021-08-31 ENCOUNTER — Ambulatory Visit: Payer: Medicare Other | Admitting: Cardiovascular Disease

## 2021-08-31 VITALS — BP 112/60 | HR 78 | Ht 64.0 in | Wt 163.6 lb

## 2021-08-31 DIAGNOSIS — G4733 Obstructive sleep apnea (adult) (pediatric): Secondary | ICD-10-CM

## 2021-08-31 DIAGNOSIS — N1832 Chronic kidney disease, stage 3b: Secondary | ICD-10-CM

## 2021-08-31 DIAGNOSIS — Z9989 Dependence on other enabling machines and devices: Secondary | ICD-10-CM

## 2021-08-31 DIAGNOSIS — Z7901 Long term (current) use of anticoagulants: Secondary | ICD-10-CM

## 2021-08-31 DIAGNOSIS — I350 Nonrheumatic aortic (valve) stenosis: Secondary | ICD-10-CM

## 2021-08-31 DIAGNOSIS — C9000 Multiple myeloma not having achieved remission: Secondary | ICD-10-CM

## 2021-08-31 DIAGNOSIS — I4819 Other persistent atrial fibrillation: Secondary | ICD-10-CM

## 2021-08-31 DIAGNOSIS — I5032 Chronic diastolic (congestive) heart failure: Secondary | ICD-10-CM

## 2021-08-31 NOTE — Patient Instructions (Addendum)
Medication Instructions:  Your Physician recommend you continue on your current medication as directed.     *If you need a refill on your cardiac medications before your next appointment, please call your pharmacy*   Lab Work: None ordered today   Testing/Procedures: None ordered today   Follow-Up: At [facility], you and your health needs are our priority.  As part of our continuing mission to provide you with exceptional heart care, we have created designated Provider Care Teams.  These Care Teams include your primary Cardiologist (physician) and Advanced Practice Providers (APPs -  Physician Assistants and Nurse Practitioners) who all work together to provide you with the care you need, when you need it.  We recommend signing up for the patient portal called "MyChart".  Sign up information is provided on this After Visit Summary.  MyChart is used to connect with patients for Virtual Visits (Telemedicine).  Patients are able to view lab/test results, encounter notes, upcoming appointments, etc.  Non-urgent messages can be sent to your provider as well.   To learn more about what you can do with MyChart, go to [email or web address].    Your next appointment:   1 year(s)  The format for your next appointment:   In Person  Provider:   Shelva Majestic, MD   Other Instructions Dr. Claiborne Billings is recommending changing CPAP range from 8-14 to 10-16. You can bring CPAP machine to our office for titration. Ask for Mariann Laster (Sleep Coordinator).

## 2021-08-31 NOTE — Progress Notes (Signed)
Patient ID: Thomas [name], male   DOB: 09/03/1935, 86 y.o.   MRN: [id number]     HPI: Thomas [name], is a 86 y.o. male who is followed by Dr. Tamala Julian for cardiology care.  I initially saw him  in September 2015 for sleep clinic evaluation following initiation of CPAP therapy.  I last saw him in November 2021.  He presents for 1 year follow-up sleep evaluation.  Thomas [name] has a history of paroxysmal atrial fibrillation and  has been maintaining sinus rhythm with sinus bradycardia.  He has a history of hypertension, hyperlipidemia, documented bilateral renal cysts, and mild aortic valve stenosis, and multiple myeloma.  Due to concerns for obstructive sleep apnea, particularly with his atrial fibrillation, history he was referred by Dr. Tamala Julian for a diagnostic polysomnogram in 2015.   His diagnostic polysomnogram was done at the [facility] on 02/05/2014 revealed severe obstructive sleep apnea with an AHI of 45.7 per hour.  He had oxygen desaturation to 86% and there was evidence for moderate snoring.  There were frequent periodic limb movements with an index of 36 per hour with 20.3 per hour to arousal.  He subsequently was referred for a CPAP titration in a CPAP pressure of 9 cm was recommended.  Of note, there was significant improvement in his periodic leg movements with CPAP therapy.  A download from 05/24/2014 to 06/22/2014 showed excellent compliance with 100% days of usage.  He had 97% days with use greater than 4 hours and was meeting Medicare compliance standards.  He has an AirSense 10 AutoSet unit and is at a set pressure of 9 cm.  His AHI is now excellent at 1.2.  There is no leak.  He has a Special educational needs teacher, fullface mask (medium).  When I saw him in February 2018 he was 100% compliant with CPAP.  Download  from 10/23/2016 through 11/21/2016, which confirmed 100% compliance.  CPAP was set at 9 cm set pressure; AHI was 1.0.  He was using a full facemask.  His nocturia  had significantly improved with CPAP treatment. His Epworth Sleepiness Scale score is improved and was 6 compared to previously being elevated  Epworth Sleepiness Scale: Situation   Chance of Dozing/Sleeping (0 = never , 1 = slight chance , 2 = moderate chance , 3 = high chance )   sitting and reading 2   watching TV 1   sitting inactive in a public place 0   being a passenger in a motor vehicle for an hour or more 0   lying down in the afternoon 3   sitting and talking to someone 0   sitting quietly after lunch (no alcohol) 0   while stopped for a few minutes in traffic as the driver 0   Total Score  6   He continues to be followed by Dr. Tamala Julian for his paroxysmal atrial fibrillation, chronic diastolic heart failure, hypertension, chronic kidney disease and mild aortic stenosis.  Remotely he was not on anticoagulation secondary to  recurrent GU bleeding, but currently is on low-dose Eliquis.  He has undergone chemotherapy for multiple myeloma as well as treatment for prostate CA.  When I saw him in October 2019 he had  lost 49 pounds with his recent illnesses.  His primary physician question whether or not he still needed to use CPAP therapy.  In the office today I calculated a new Epworth Sleepiness Scale score and this endorsed at 11 and was consistent with mild  daytime sleepiness.  He has a ResMed air sense 10 AutoSet unit and has been set at a 9 cm pressure.  He was using an old air fit F10 full facemask and has had difficulty with pressure on the bridge of his nose.  A download from June 25, 2018 through July 24, 2018 shows 100% compliance;  averaging 6 hours and 26 minutes of CPAP use.  AHI is 3.1.  There was no leak.  During that evaluation I recommended he switch to the DreamWear technology full facemask in light of his difficulty with the older facemask on the bridge of his nose.  We discussed the importance of increasing sleep duration since his CPAP use was just under 6 hours and  30 minutes.  I saw him in November 2020 and he continued to be in  permanent atrial fibrillation and was on diltiazem CD 120 mg and Eliquis 2.5 mg twice a day.  He last saw Dr. Tamala Julian in February 2020.  A download was obtained today from July 13, 2019 through August 11, 2019.  Usage days 87%.  He has not been able to sleep for long duration since his wife had fallen and had broken 4 vertebrae and has had significant difficulty resulting from this.  As result he has been caring for her and oftentimes gets up during the night.  On his  download average use was 5 hours per night.  At his 9 cm water pressure, AHI however continued to be excellent at 1.2.  He typically goes to bed at 11:30 and wakes up at 630.  He likes the current air fit F 30 medium size mask that he is using that has a Pharmacologist compared to his old full facemask.  However, at times the tubing which from the front sometimes gets in the way when he tries to turn from side to side.  During that evaluation I recommended he switch to the ResMed air fit F 30i mask where the tubing arises from the crown of the head and does not interfere with him moving side to side.  At his August 24, 2020 evaluation he remained stable.   Previously he had significant weight loss as result of his illnesses.  But over the past year has had weight gain from 154 up to 173 pounds.  He admits to continuing to feel well with CPAP therapy.  He is unaware of breakthrough snoring.  A download was obtained from October 21 through August 21, 2020.  He is meeting compliance standards.  Average use is 6 hours and 11 minutes.  At a 9 cm set pressure, AHI is 4.2.  There is no mask leak.  An Epworth Sleepiness Scale score was recalculated in the office today and this endorsed at 5 arguing against residual daytime sleepiness.   Since I last saw him, he has continued to be followed by Dr. Tamala Julian.  He underwent an echo Doppler study in August 2022 which showed EF  of 55 to 60% with mild LVH.  There was an increase in there is aortic valve gradient with a peak gradient of 38 and a mean gradient of 19.  There was mild aortic root dilatation.  From a sleep perspective, he continues to use CPAP therapy.  Due to this 5G upgrade, his machine is no longer wireless.  [employer] [facility] [employer] is his DME company.  We were able to obtain data today with a download from October 31 through August 31, 2021.  Usage  is excellent at 100%.  There is no mask leak.  His pressures are set at a range of 8 to 14 cm of water and his 95th percentile pressure is 13.6 with a maximum average pressure of 13.9.  Average use however was just shy of 6 hours at 5 hours and 56 minutes.  He states he typically goes to bed between 1115 and 1130 and can wake up anywhere between 6 and 7:30 AM.  He has issues with low back pain and the bad right knee and walks with a cane.  He presents for evaluation.   Past Medical History:  Diagnosis Date   BPH (benign prostatic hypertrophy)    Chronic diastolic heart failure ([place]) 12/27/2013   Chronic kidney disease    Hx of bilateral renal cysts.   Encounter for antineoplastic chemotherapy 02/20/2018   Glaucoma    Heart murmur    History of atrial fibrillation 03/09/09   Hx of epiglottitis    2011   Hypercholesterolemia    Hypertension    Long term current use of amiodarone 11/07/2013   Amiodarone    Mild aortic stenosis    OSA on CPAP 06/26/2014   Paroxysmal atrial fibrillation ([place]) 11/07/2013   Prostate cancer ([place]) 7/12   T1C Ad Ca  s/p TURP, observation only   Renal insufficiency 09/06/2011   Sinus node dysfunction ([place]) 08/14/2014   Sleep apnea    Smoldering multiple myeloma     Past Surgical History:  Procedure Laterality Date   ADRENALECTOMY  02/12/11   Left, for Pheochromocytoma   CARDIOVERSION N/A 12/26/2013   Procedure: CARDIOVERSION;  Surgeon: [name], MD;  Location: [facility];  Service: Cardiovascular;  Laterality: N/A;    CHOLECYSTECTOMY  02/12/11   GASTRECTOMY  1984   S/P gastrectomy for ulcer   [place]   S/P Right knee surgery   TEE WITHOUT CARDIOVERSION N/A 12/26/2013   Procedure: TRANSESOPHAGEAL ECHOCARDIOGRAM (TEE);  Surgeon: [name], MD;  Location: [facility];  Service: Cardiovascular;  Laterality: N/A;   TRANSURETHRAL RESECTION OF PROSTATE  7/12    Allergies  Allergen Reactions   Benadryl [Diphenhydramine Hcl] Other (See Comments)    Bad dreams -  Hallucinations.   Ceftriaxone Sodium Hives   Contrast Media [Iodinated Diagnostic Agents] Other (See Comments)    Poor kidney function   Nsaids Other (See Comments)    REACTION: BLEEDING ULCER   Penicillins Hives   Codeine     Other reaction(s): loopy   Ibuprofen     Other reaction(s): hives   Levaquin [Levofloxacin]    Oxybutynin Other (See Comments)    Lightheaded, dizzy, unbalanced. "Disturbed my equilibrium."   Tramadol Hcl     Other reaction(s): dizzy/nauseated   Chlorhexidine Rash    Use alcohol for skin prep per patient request.     Current Outpatient Medications  Medication Sig Dispense Refill   acetaminophen (TYLENOL) 500 MG tablet Take 1,000 mg by mouth as needed.      brimonidine-timolol (COMBIGAN) 0.2-0.5 % ophthalmic solution Place 1 drop into both eyes every 12 (twelve) hours.     Cholecalciferol (VITAMIN D3) 50 MCG (2000 UT) capsule Take 1 capsule by mouth daily.      diltiazem (CARDIZEM CD) 120 MG 24 hr capsule TAKE ONE CAPSULE BY MOUTH ONCE DAILY 90 capsule 1   ELIQUIS 2.5 MG TABS tablet TAKE ONE TABLET BY MOUTH TWICE DAILY 90 tablet 1   hydrochlorothiazide (MICROZIDE) 12.5 MG capsule Take 1 capsule (12.5  mg total) by mouth daily. 90 capsule 3   latanoprost (XALATAN) 0.005 % ophthalmic solution INSTILL 1 DROP INTO BOTH EYES EVERY DAY AT NIGHT  6   potassium chloride (KLOR-CON) 10 MEQ tablet Take 1 tablet (10 mEq total) by mouth daily. 90 tablet 3   tamsulosin (FLOMAX) 0.4 MG CAPS capsule TAKE ONE CAPSULE BY  MOUTH EVERY EVENING AFTER SUPPER 30 capsule 5   No current facility-administered medications for this visit.    Social History   Socioeconomic History   Marital status: Married    Spouse name: Not on file   Number of children: 3   Years of education: Not on file   Highest education level: Not on file  Occupational History    Employer: RETIRED  Tobacco Use   Smoking status: Never   Smokeless tobacco: Never  Vaping Use   Vaping Use: Never used  Substance and Sexual Activity   Alcohol use: No   Drug use: No   Sexual activity: Not Currently  Other Topics Concern   Not on file  Social History Narrative   Not on file   Social Determinants of Health   Financial Resource Strain: Not on file  Food Insecurity: Not on file  Transportation Needs: Not on file  Physical Activity: Not on file  Stress: Not on file  Social Connections: Not on file  Intimate Partner Violence: Not on file     ROS General: Negative; No fevers, chills, or night sweats HEENT: Negative; No changes in vision or hearing, sinus congestion, difficulty swallowing Pulmonary: Negative; No cough, wheezing, shortness of breath, hemoptysis Cardiovascular: Positive for permanent atrial fibrillation; aortic stenosis GI: Negative; No nausea, vomiting, diarrhea, or abdominal pain GU: Negative; No dysuria, hematuria, or difficulty voiding Musculoskeletal: Back pain, walks with a cane Hematologic: Positive for multiple myeloma; and prostate CA Endocrine: Negative; no heat/cold intolerance Neuro: Negative; no changes in balance, headaches Skin: Negative; No rashes or skin lesions Psychiatric: Negative; No behavioral problems, depression Sleep: Positive for OSA on CPAP; No daytime sleepiness, hypersomnolence, bruxism, restless legs, hypnogognic hallucinations, no cataplexy   Physical Exam BP 112/60   Pulse 78   Ht 5' 4"  (1.626 m)   Wt 163 lb 9.6 oz (74.2 kg)   SpO2 98%   BMI 28.08 kg/m    Repeat blood  pressure by me was 118/64  Wt Readings from Last 3 Encounters:  08/31/21 163 lb 9.6 oz (74.2 kg)  06/24/21 160 lb 3 oz (72.7 kg)  05/10/21 165 lb 6.4 oz (75 kg)    General: Alert, oriented, no distress.  Skin: normal turgor, no rashes, warm and dry HEENT: Normocephalic, atraumatic. Pupils equal round and reactive to light; sclera anicteric; extraocular muscles intact;  Nose without nasal septal hypertrophy Mouth/Parynx benign; Mallinpatti scale 3 Neck: No JVD, no carotid bruits; normal carotid upstroke Lungs: clear to ausculatation and percussion; no wheezing or rales Chest wall: without tenderness to palpitation Heart: PMI not displaced, irregularly irregular consistent with atrial fibrillation, s1 s2 normal, 2/6 systolic murmur in the aortic area, no diastolic murmur, no rubs, gallops, thrills, or heaves Abdomen: soft, nontender; no hepatosplenomehaly, BS+; abdominal aorta nontender and not dilated by palpation. Back: no CVA tenderness Pulses 2+ Musculoskeletal: full range of motion, normal strength, no joint deformities Extremities: no clubbing cyanosis or edema, Homan's sign negative  Neurologic: grossly nonfocal; Cranial nerves grossly wnl Psychologic: Normal mood and affect   August 31, 2021 ECG (independently read by me):  Atrial fibrillation at 78  August 24, 2020 ECG (independently read by me): Atrial fibrillation at 63; QTc 417 msec    November 2020 ECG (independently read by me): Atrial fibrillation at 78; QTc 414 msec  October 2019 ECG (independently read by me): Atrial fibrillation at 84 bpm.  QTc interval 460 ms.  February 2018 ECG (independently read by me): Sinus bradycardia at 59 bpm with first-degree AV block (PR interval 21 6/22).  QTc interval 451 ms.  LABS:  BMP Latest Ref Rng & Units 06/17/2021 06/04/2021 05/20/2021  Glucose 70 - 99 mg/dL 102(H) 94 138(H)  BUN 8 - 23 mg/dL 25(H) 21 26  Creatinine 0.61 - 1.24 mg/dL 1.58(H) 1.56(H) 1.65(H)  BUN/Creat Ratio  10 - 24 - 13 16  Sodium 135 - 145 mmol/L 140 139 139  Potassium 3.5 - 5.1 mmol/L 3.7 4.0 4.1  Chloride 98 - 111 mmol/L 106 101 104  CO2 22 - 32 mmol/L 27 25 22   Calcium 8.9 - 10.3 mg/dL 10.5(H) 10.1 9.6   Hepatic Function Latest Ref Rng & Units 06/17/2021 12/10/2020 09/10/2020  Total Protein 6.5 - 8.1 g/dL 6.7 6.8 6.6  Albumin 3.5 - 5.0 g/dL 4.0 4.0 3.9  AST 15 - 41 U/L 20 20 19   ALT 0 - 44 U/L 14 17 15   Alk Phosphatase 38 - 126 U/L 103 118 99  Total Bilirubin 0.3 - 1.2 mg/dL 0.9 0.8 0.9  Bilirubin, Direct <=0.2 mg/dL - - -   CBC Latest Ref Rng & Units 06/17/2021 05/10/2021 12/10/2020  WBC 4.0 - 10.5 K/uL 4.7 5.4 5.8  Hemoglobin 13.0 - 17.0 g/dL 15.0 15.5 16.8  Hematocrit 39.0 - 52.0 % 42.6 45.5 49.0  Platelets 150 - 400 K/uL 145(L) 173 215   Lab Results  Component Value Date   TSH 4.500 03/29/2017   Lab Results  Component Value Date   HGBA1C 5.4 12/25/2013   Lipid Panel     Component Value Date/Time   CHOL 164 02/25/2020 0842   TRIG 131 02/25/2020 0842   HDL 41 02/25/2020 0842   CHOLHDL 4.0 02/25/2020 0842   LDLCALC 100 (H) 02/25/2020 8032     RADIOLOGY: No results found.  IMPRESSION:  1. OSA on CPAP   2. Persistent atrial fibrillation ([place])   3. Chronic diastolic heart failure ([place])   4. Nonrheumatic aortic valve stenosis   5. Chronic anticoagulation   6. Multiple myeloma not having achieved remission ([facility])   7. Stage 3b chronic kidney disease ([place])     ASSESSMENT AND PLAN: Thomas [name] Is an 86 year old male who has a history of permanent atrial fibrillation, previous documentation of chronic diastolic heart failure, hypertension, chronic kidney disease, aortic stenosis as well as a history of multiple myeloma, prostate adenocarcinoma and pheochromocytoma status post left adrenal gland resection.  He has been documented to have severe obstructive sleep apnea with an AHI of 45.7 and has been on CPAP therapy since 03/26/2014.   He had significant periodic leg  movements on the baseline study, but these significantly improved with CPAP initiation.  He has had issues with his left knee and often moves his legs for comfort but denies any painful restless legs.  Over the years he had lost significant weight from 196 pounds to  148 and had subsequent weight gain now at 163 pounds improved from 173 pounds in November 2021.  His atrial fibrillation rate is controlled on his current regimen.  He recently had follow-up echocardiography in August 2022 by Dr. Tamala Julian which showed some progression of his  aortic stenosis to a mean gradient of note 19 mm with peak gradient of 38 and mild to moderate aortic insufficiency.  From a sleep perspective, he continues to use CPAP therapy and is meeting compliance standards.  I discussed with him optimal sleep duration at 7 to 9 hours.  On his most recent download, usage was excellent.  Sleep duration was slightly under 6 hours.  AHI is 3.7 and with his 95th percentile pressure 13.6 I am changing his parameters from a range of 8 to 14cm to 10 to 16 cm of water.  CPAP is no longer wireless due to the 5G upgrade and data is retrieved via card.  He continues to follow up with Dr. Earlie Server regarding his IgG kappa multiple myeloma remote pheochromocytoma and prostate adenocarcinoma.  He has stage IIIb CKD.  From a sleep perspective, he is stable.  His sleep is restorative and he is unaware of any breakthrough snoring.  He will follow-up with Dr. Tamala Julian.  I will see him in 1 year for follow-up evaluation.    Troy Sine, MD, [facility]  09/07/2021 4:00 PM

## 2021-09-07 ENCOUNTER — Encounter: Payer: Self-pay | Admitting: Cardiovascular Disease

## 2021-09-15 ENCOUNTER — Other Ambulatory Visit: Payer: Self-pay

## 2021-09-15 ENCOUNTER — Inpatient Hospital Stay: Payer: Medicare Other | Attending: Internal Medicine

## 2021-09-15 DIAGNOSIS — C61 Malignant neoplasm of prostate: Secondary | ICD-10-CM | POA: Diagnosis not present

## 2021-09-15 DIAGNOSIS — I5032 Chronic diastolic (congestive) heart failure: Secondary | ICD-10-CM | POA: Insufficient documentation

## 2021-09-15 DIAGNOSIS — C9000 Multiple myeloma not having achieved remission: Secondary | ICD-10-CM | POA: Insufficient documentation

## 2021-09-15 DIAGNOSIS — I480 Paroxysmal atrial fibrillation: Secondary | ICD-10-CM | POA: Insufficient documentation

## 2021-09-15 DIAGNOSIS — Z885 Allergy status to narcotic agent status: Secondary | ICD-10-CM | POA: Diagnosis not present

## 2021-09-15 DIAGNOSIS — Z888 Allergy status to other drugs, medicaments and biological substances status: Secondary | ICD-10-CM | POA: Insufficient documentation

## 2021-09-15 DIAGNOSIS — Z9079 Acquired absence of other genital organ(s): Secondary | ICD-10-CM | POA: Insufficient documentation

## 2021-09-15 DIAGNOSIS — G4733 Obstructive sleep apnea (adult) (pediatric): Secondary | ICD-10-CM | POA: Insufficient documentation

## 2021-09-15 DIAGNOSIS — R5383 Other fatigue: Secondary | ICD-10-CM | POA: Diagnosis not present

## 2021-09-15 DIAGNOSIS — Z7901 Long term (current) use of anticoagulants: Secondary | ICD-10-CM | POA: Insufficient documentation

## 2021-09-15 DIAGNOSIS — Z881 Allergy status to other antibiotic agents status: Secondary | ICD-10-CM | POA: Insufficient documentation

## 2021-09-15 DIAGNOSIS — Z886 Allergy status to analgesic agent status: Secondary | ICD-10-CM | POA: Insufficient documentation

## 2021-09-15 DIAGNOSIS — Z79899 Other long term (current) drug therapy: Secondary | ICD-10-CM | POA: Diagnosis not present

## 2021-09-15 DIAGNOSIS — Z880 Allergy status to penicillin: Secondary | ICD-10-CM | POA: Insufficient documentation

## 2021-09-15 DIAGNOSIS — Z9049 Acquired absence of other specified parts of digestive tract: Secondary | ICD-10-CM | POA: Insufficient documentation

## 2021-09-15 LAB — CMP (CANCER CENTER ONLY)
ALT: 13 U/L (ref 0–44)
AST: 19 U/L (ref 15–41)
Albumin: 4 g/dL (ref 3.5–5.0)
Alkaline Phosphatase: 96 U/L (ref 38–126)
Anion gap: 7 (ref 5–15)
BUN: 25 mg/dL — ABNORMAL HIGH (ref 8–23)
CO2: 27 mmol/L (ref 22–32)
Calcium: 9.8 mg/dL (ref 8.9–10.3)
Chloride: 104 mmol/L (ref 98–111)
Creatinine: 1.66 mg/dL — ABNORMAL HIGH (ref 0.61–1.24)
GFR, Estimated: 40 mL/min — ABNORMAL LOW (ref >60)
Glucose, Bld: 97 mg/dL (ref 70–99)
Potassium: 4 mmol/L (ref 3.5–5.1)
Sodium: 138 mmol/L (ref 135–145)
Total Bilirubin: 0.9 mg/dL (ref 0.3–1.2)
Total Protein: 6.9 g/dL (ref 6.5–8.1)

## 2021-09-15 LAB — CBC WITH DIFFERENTIAL (CANCER CENTER ONLY)
Abs Immature Granulocytes: 0.07 10*3/uL (ref 0.00–0.07)
Basophils Absolute: 0 10*3/uL (ref 0.0–0.1)
Basophils Relative: 0 %
Eosinophils Absolute: 0.1 10*3/uL (ref 0.0–0.5)
Eosinophils Relative: 1 %
HCT: 43.4 % (ref 39.0–52.0)
Hemoglobin: 15.2 g/dL (ref 13.0–17.0)
Immature Granulocytes: 1 %
Lymphocytes Relative: 17 %
Lymphs Abs: 0.9 10*3/uL (ref 0.7–4.0)
MCH: 32.6 pg (ref 26.0–34.0)
MCHC: 35 g/dL (ref 30.0–36.0)
MCV: 93.1 fL (ref 80.0–100.0)
Monocytes Absolute: 0.6 10*3/uL (ref 0.1–1.0)
Monocytes Relative: 11 %
Neutro Abs: 3.5 10*3/uL (ref 1.7–7.7)
Neutrophils Relative %: 70 %
Platelet Count: 180 10*3/uL (ref 150–400)
RBC: 4.66 MIL/uL (ref 4.22–5.81)
RDW: 14 % (ref 11.5–15.5)
WBC Count: 5 10*3/uL (ref 4.0–10.5)
nRBC: 0 % (ref 0.0–0.2)

## 2021-09-15 LAB — LACTATE DEHYDROGENASE: LDH: 193 U/L — ABNORMAL HIGH (ref 98–192)

## 2021-09-16 LAB — KAPPA/LAMBDA LIGHT CHAINS
Kappa free light chain: 78.4 mg/L — ABNORMAL HIGH (ref 3.3–19.4)
Kappa, lambda light chain ratio: 5.3 — ABNORMAL HIGH (ref 0.26–1.65)
Lambda free light chains: 14.8 mg/L (ref 5.7–26.3)

## 2021-09-16 LAB — BETA 2 MICROGLOBULIN, SERUM: Beta-2 Microglobulin: 2.9 mg/L — ABNORMAL HIGH (ref 0.6–2.4)

## 2021-09-16 LAB — IGG, IGA, IGM
IgA: 68 mg/dL (ref 61–437)
IgG (Immunoglobin G), Serum: 1090 mg/dL (ref 603–1613)
IgM (Immunoglobulin M), Srm: 39 mg/dL (ref 15–143)

## 2021-09-21 ENCOUNTER — Other Ambulatory Visit: Payer: Medicare Other

## 2021-09-22 ENCOUNTER — Other Ambulatory Visit: Payer: Self-pay

## 2021-09-22 ENCOUNTER — Inpatient Hospital Stay: Payer: Medicare Other | Admitting: Internal Medicine

## 2021-09-22 ENCOUNTER — Encounter: Payer: Self-pay | Admitting: Internal Medicine

## 2021-09-22 VITALS — BP 123/68 | HR 87 | Temp 97.7°F | Resp 18 | Ht 64.0 in | Wt 164.6 lb

## 2021-09-22 DIAGNOSIS — Z7901 Long term (current) use of anticoagulants: Secondary | ICD-10-CM | POA: Diagnosis not present

## 2021-09-22 DIAGNOSIS — C9000 Multiple myeloma not having achieved remission: Secondary | ICD-10-CM

## 2021-09-22 DIAGNOSIS — R5383 Other fatigue: Secondary | ICD-10-CM | POA: Diagnosis not present

## 2021-09-22 DIAGNOSIS — Z885 Allergy status to narcotic agent status: Secondary | ICD-10-CM | POA: Diagnosis not present

## 2021-09-22 DIAGNOSIS — I5032 Chronic diastolic (congestive) heart failure: Secondary | ICD-10-CM | POA: Diagnosis not present

## 2021-09-22 DIAGNOSIS — I480 Paroxysmal atrial fibrillation: Secondary | ICD-10-CM | POA: Diagnosis not present

## 2021-09-22 DIAGNOSIS — Z881 Allergy status to other antibiotic agents status: Secondary | ICD-10-CM | POA: Diagnosis not present

## 2021-09-22 DIAGNOSIS — G4733 Obstructive sleep apnea (adult) (pediatric): Secondary | ICD-10-CM | POA: Diagnosis not present

## 2021-09-22 DIAGNOSIS — Z880 Allergy status to penicillin: Secondary | ICD-10-CM | POA: Diagnosis not present

## 2021-09-22 DIAGNOSIS — Z888 Allergy status to other drugs, medicaments and biological substances status: Secondary | ICD-10-CM | POA: Diagnosis not present

## 2021-09-22 DIAGNOSIS — Z886 Allergy status to analgesic agent status: Secondary | ICD-10-CM | POA: Diagnosis not present

## 2021-09-22 DIAGNOSIS — Z79899 Other long term (current) drug therapy: Secondary | ICD-10-CM | POA: Diagnosis not present

## 2021-09-22 DIAGNOSIS — Z9049 Acquired absence of other specified parts of digestive tract: Secondary | ICD-10-CM | POA: Diagnosis not present

## 2021-09-22 NOTE — Progress Notes (Signed)
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 2) History of pheochromocytoma LEFT adrenal gland status post resection on 03/16/2009 3) Prostate adenocarcinoma  PRIOR THERAPY:  1)Systemic therapy with weekly Velcade 1.3 mg/KG subcutaneously, Revlimid 25 mg p.o. daily for 21 days every 4 weeks as well as Decadron 20 mg p.o. weekly.  First dose November 07, 2017.  Status post 7 cycles. 2) definitive radiotherapy to the prostate cancer under the care of Dr. Tammi Klippel.  CURRENT THERAPY: Observation.  INTERVAL HISTORY: Thomas [name] 86 y.o. male returns to the clinic today for 89-monthfollow-up visit accompanied by his wife.  The patient is feeling fine today with no concerning complaints.  He denied having any current chest pain, shortness of breath, cough or hemoptysis.  He denied having any nausea, vomiting, diarrhea or constipation.  He has no headache or visual changes.  He has no weight loss or night sweats.  He has no bleeding, bruises or ecchymosis.  He has no bony pains.  The patient is here today for evaluation with repeat myeloma panel for evaluation of his disease.  MEDICAL HISTORY: Past Medical History:  Diagnosis Date   BPH (benign prostatic hypertrophy)    [place]ronic diastolic heart [place] 12/27/2013   Chronic kidney disease    Hx of bilateral renal cysts.   Encounter for antineoplastic chemotherapy 02/20/2018   Glaucoma    Heart murmur    History of atrial fibrillation 03/09/09   Hx of epiglottitis    2011   Hypercholesterolemia    Hypertension    Long term current use of amiodarone 11/07/2013   Amiodarone    Mild aortic stenosis    OSA on CPAP 06/26/2014   Paroxysmal atrial fibrillation ([facility] 11/07/2013   Prostate  cancer ([facility] 7/12   T1C Ad Ca  s/p TURP, observation only   Renal insufficiency 09/06/2011   Sinus node dysfunction ([facility] 08/14/2014   Sleep apnea    Smoldering multiple myeloma     ALLERGIES:  is allergic to benadryl [diphenhydramine hcl], ceftriaxone sodium, contrast media [iodinated diagnostic agents], nsaids, penicillins, codeine, ibuprofen, levaquin [levofloxacin], oxybutynin, tramadol hcl, chlorhexidine, and furosemide.  MEDICATIONS:  Current Outpatient Medications  Medication Sig Dispense Refill   acetaminophen (TYLENOL) 500 MG tablet Take 1,000 mg by mouth as needed.      brimonidine-timolol (COMBIGAN) 0.2-0.5 % ophthalmic solution Place 1 drop into both eyes every 12 (twelve) hours.     Cholecalciferol (VITAMIN D3) 50 MCG (2000 UT) capsule Take 1 capsule by mouth daily.      diltiazem (CARDIZEM CD) 120 MG 24 hr capsule TAKE ONE CAPSULE BY MOUTH ONCE DAILY 90 capsule 1   ELIQUIS 2.5 MG TABS tablet TAKE ONE TABLET BY MOUTH TWICE DAILY 90 tablet 1   hydrochlorothiazide (MICROZIDE) 12.5 MG capsule Take 1 capsule (12.5 mg total) by mouth daily. 90 capsule 3   latanoprost (XALATAN) 0.005 % ophthalmic solution INSTILL 1 DROP INTO BOTH EYES EVERY DAY AT NIGHT  6   potassium chloride (KLOR-CON) 10 MEQ tablet Take 1 tablet (10 mEq total) by mouth daily. 90 tablet 3   tamsulosin (FLOMAX) 0.4 MG CAPS capsule TAKE ONE CAPSULE BY MOUTH EVERY EVENING AFTER SUPPER 30 capsule 5   No current facility-administered medications for  this visit.    SURGICAL HISTORY:  Past Surgical History:  Procedure Laterality Date   ADRENALECTOMY  02/12/11   Left, for Pheochromocytoma   CARDIOVERSION N/A 12/26/2013   Procedure: CARDIOVERSION;  Surgeon: [name], MD;  Location: [facility];  Service: Cardiovascular;  Laterality: N/A;   CHOLECYSTECTOMY  02/12/11   GASTRECTOMY  1984   S/P gastrectomy for ulcer   KNEE SURGERY  1971   S/P Right knee surgery   TEE WITHOUT CARDIOVERSION N/A 12/26/2013    Procedure: TRANSESOPHAGEAL ECHOCARDIOGRAM (TEE);  Surgeon: [name], MD;  [facility] ENDOSCOPY;  Service: Cardiovascular;  Laterality: N/A;   TRANSURETHRAL RESECTION OF PROSTATE  7/12    REVIEW OF SYSTEMS:  Constitutional: positive for fatigue Eyes: negative Ears, nose, mouth, throat, and face: negative Respiratory: negative Cardiovascular: negative Gastrointestinal: negative Genitourinary:negative Integument/breast: negative Hematologic/lymphatic: negative Musculoskeletal:negative Neurological: negative Behavioral/Psych: negative Endocrine: negative Allergic/Immunologic: negative   PHYSICAL EXAMINATION: General appearance: alert, cooperative and no distress Head: Normocephalic, without obvious abnormality, atraumatic Neck: no adenopathy, no JVD, supple, symmetrical, trachea midline and thyroid not enlarged, symmetric, no tenderness/mass/nodules Lymph nodes: Cervical, supraclavicular, and axillary nodes normal. Resp: clear to auscultation bilaterally Back: symmetric, no curvature. ROM normal. No CVA tenderness. Cardio: regular rate and rhythm, S1, S2 normal, no murmur, click, rub or gallop GI: soft, non-tender; bowel sounds normal; no masses,  no organomegaly Extremities: extremities normal, atraumatic, no cyanosis or edema  ECOG PERFORMANCE STATUS: 1 - Symptomatic but completely ambulatory  Blood pressure 123/68, pulse 87, temperature 97.7 F (36.5 C), temperature source Tympanic, resp. rate 18, height 5' 4"  (1.626 m), weight 164 lb 9.6 oz (74.7 kg), SpO2 100 %.  LABORATORY DATA: Lab Results  Component Value Date   WBC 5.0 09/15/2021   HGB 15.2 09/15/2021   HCT 43.4 09/15/2021   MCV 93.1 09/15/2021   PLT 180 09/15/2021      Chemistry      Component Value Date/Time   NA 138 09/15/2021 1053   NA 139 06/04/2021 1041   NA 138 08/16/2017 1404   K 4.0 09/15/2021 1053   K 4.5 08/16/2017 1404   CL 104 09/15/2021 1053   CL 109 (H) 03/21/2013 0958   CO2 27  09/15/2021 1053   CO2 24 08/16/2017 1404   BUN 25 (H) 09/15/2021 1053   BUN 21 06/04/2021 1041   BUN 17.3 08/16/2017 1404   CREATININE 1.66 (H) 09/15/2021 1053   CREATININE 1.8 (H) 08/16/2017 1404      Component Value Date/Time   CALCIUM 9.8 09/15/2021 1053   CALCIUM 9.9 08/16/2017 1404   ALKPHOS 96 09/15/2021 1053   ALKPHOS 86 08/16/2017 1404   AST 19 09/15/2021 1053   AST 22 08/16/2017 1404   ALT 13 09/15/2021 1053   ALT 16 08/16/2017 1404   BILITOT 0.9 09/15/2021 1053   BILITOT 0.78 08/16/2017 1404       RADIOGRAPHIC STUDIES: No results found.  ASSESSMENT AND PLAN:  This is a very pleasant 86 years old white male with IgG smoldering Loma diagnosed in April 2011. The patient has been on observation since 2011. His recent myeloma panel showed further increase in the free kappa light chain. His recent skeletal bone survey showed no concerning findings for disease progression.  He has a focus of avascular necrosis but no significant lytic lesions. His bone marrow bone marrow biopsy and aspirate showed further increase in the plasma cells to 15%. The patient underwent treatment with subcutaneous weekly Velcade as well as Revlimid  and Decadron status post 7 cycles. The patient has been in observation since that time and he is feeling fine with no concerning complaints. He had repeat myeloma panel performed recently.  I discussed the lab results with the patient and his wife.  There is further increase in the free kappa light chain but normal quantitative immunoglobulin.  He has no significant anemia and his renal function are stable. I discussed with the patient the option of continuous observation and monitoring versus resuming his treatment with Velcade and Revlimid. After a lengthy discussion of the pros and cons of the treatment and observation, the patient and his wife would like to continue on observation for now. I will see him back for follow-up visit in 4 months for  evaluation and repeat myeloma panel. For the history of prostate cancer he is currently followed by Dr. Alinda Money and Dr. Alen Blew for hormonal therapy. The patient was advised to call immediately if he has any other concerning symptoms in the interval.  The patient voices understanding of current disease status and treatment options and is in agreement with the current care plan. All questions were answered. The patient knows to call the clinic with any problems, questions or concerns. We can certainly see the patient much sooner if necessary.  Disclaimer: This note was dictated with voice recognition software. Similar sounding words can inadvertently be transcribed and may not be corrected upon review.

## 2021-09-28 ENCOUNTER — Ambulatory Visit: Payer: Medicare Other | Admitting: Internal Medicine

## 2021-09-28 ENCOUNTER — Other Ambulatory Visit: Payer: Self-pay | Admitting: Interventional Cardiology

## 2021-09-29 DIAGNOSIS — M1711 Unilateral primary osteoarthritis, right knee: Secondary | ICD-10-CM | POA: Diagnosis not present

## 2021-09-29 DIAGNOSIS — G459 Transient cerebral ischemic attack, unspecified: Secondary | ICD-10-CM | POA: Diagnosis not present

## 2021-09-29 DIAGNOSIS — E032 Hypothyroidism due to medicaments and other exogenous substances: Secondary | ICD-10-CM | POA: Diagnosis not present

## 2021-09-29 DIAGNOSIS — I480 Paroxysmal atrial fibrillation: Secondary | ICD-10-CM | POA: Diagnosis not present

## 2021-09-29 DIAGNOSIS — N183 Chronic kidney disease, stage 3 unspecified: Secondary | ICD-10-CM | POA: Diagnosis not present

## 2021-09-29 DIAGNOSIS — I129 Hypertensive chronic kidney disease with stage 1 through stage 4 chronic kidney disease, or unspecified chronic kidney disease: Secondary | ICD-10-CM | POA: Diagnosis not present

## 2021-09-29 DIAGNOSIS — E785 Hyperlipidemia, unspecified: Secondary | ICD-10-CM | POA: Diagnosis not present

## 2021-11-11 ENCOUNTER — Ambulatory Visit: Payer: Medicare Other | Admitting: Interventional Cardiology

## 2021-12-20 ENCOUNTER — Other Ambulatory Visit: Payer: Self-pay | Admitting: Interventional Cardiology

## 2021-12-20 DIAGNOSIS — I480 Paroxysmal atrial fibrillation: Secondary | ICD-10-CM

## 2021-12-20 NOTE — Telephone Encounter (Signed)
Eliquis 2.'5mg'$  refill request received. Patient is 87 years old, weight-74.7kg, Crea-1.66 on 09/15/2021, Diagnosis-Afib, and last seen by Dr. Tamala Julian on 05/10/2021. Dose is appropriate based on dosing criteria. Will send in refill to requested pharmacy.   ?

## 2021-12-24 ENCOUNTER — Other Ambulatory Visit: Payer: Self-pay | Admitting: Radiation Oncology

## 2021-12-27 DIAGNOSIS — N183 Chronic kidney disease, stage 3 unspecified: Secondary | ICD-10-CM | POA: Diagnosis not present

## 2021-12-27 DIAGNOSIS — E032 Hypothyroidism due to medicaments and other exogenous substances: Secondary | ICD-10-CM | POA: Diagnosis not present

## 2021-12-27 DIAGNOSIS — I129 Hypertensive chronic kidney disease with stage 1 through stage 4 chronic kidney disease, or unspecified chronic kidney disease: Secondary | ICD-10-CM | POA: Diagnosis not present

## 2021-12-27 DIAGNOSIS — E785 Hyperlipidemia, unspecified: Secondary | ICD-10-CM | POA: Diagnosis not present

## 2021-12-29 DIAGNOSIS — R3912 Poor urinary stream: Secondary | ICD-10-CM | POA: Diagnosis not present

## 2022-01-08 NOTE — Progress Notes (Signed)
?Cardiology Office Note:   ? ?Date:  01/10/2022  ? ?ID:  Thomas [name], DOB 03/19/1935, MRN [id number] ? ?PCP:  [name], MD  ?Cardiologist:  [name], MD  ? ?Referring MD: [name], MD  ? ?Chief Complaint  ?Patient presents with  ? Coronary Artery Disease  ? Hypertension  ? Atrial Fibrillation  ? ? ?History of Present Illness:   ? ?Thomas [name] is a 87 y.o. male with a hx of paroxysmal atrial fibrillation, chronic diastolic heart failure, hypertension, chronic kidney disease, unable to use anticoagulation therapy due to recurrent GU bleeding, and mild aortic stenosis.  Other medical issues include metastatic prostate CA (Lupron) and indolent multiple myeloma. ? ? ?.  He wants [place] to take potassium off his medication list because he is not taking it. ? ?He denies angina, palpitations, syncope and near syncope. ? ?Past Medical History:  ?Diagnosis Date  ? BPH (benign prostatic hypertrophy)   ? Chronic diastolic heart failure ([place]) 12/27/2013  ? Chronic kidney disease   ? Hx of bilateral renal cysts.  ? Encounter for antineoplastic chemotherapy 02/20/2018  ? Glaucoma   ? Heart murmur   ? History of atrial fibrillation 03/09/09  ? Hx of epiglottitis   ? 2011  ? Hypercholesterolemia   ? Hypertension   ? Long term current use of amiodarone 11/07/2013  ? Amiodarone   ? Mild aortic stenosis   ? OSA on CPAP 06/26/2014  ? Paroxysmal atrial fibrillation ([place]) 11/07/2013  ? Prostate cancer ([place]) 7/12  ? T1C Ad Ca  s/p TURP, observation only  ? Renal insufficiency 09/06/2011  ? Sinus node dysfunction ([place]) 08/14/2014  ? Sleep apnea   ? Smoldering multiple myeloma   ? ? ?Past Surgical History:  ?Procedure Laterality Date  ? ADRENALECTOMY  02/12/11  ? Left, for Pheochromocytoma  ? CARDIOVERSION N/A 12/26/2013  ? Procedure: CARDIOVERSION;  Surgeon: [name], MD;  Location: [place];  Service: Cardiovascular;  Laterality: N/A;  ? CHOLECYSTECTOMY  02/12/11  ? GASTRECTOMY  1984  ? S/P gastrectomy for ulcer  ?  KNEE SURGERY  1971  ? S/P Right knee surgery  ? TEE WITHOUT CARDIOVERSION N/A 12/26/2013  ? Procedure: TRANSESOPHAGEAL ECHOCARDIOGRAM (TEE);  Surgeon: [name], MD;  Location: [facility];  Service: Cardiovascular;  Laterality: N/A;  ? TRANSURETHRAL RESECTION OF PROSTATE  7/12  ? ? ?Current Medications: ?Current Meds  ?Medication Sig  ? acetaminophen (TYLENOL) 500 MG tablet Take 1,000 mg by mouth as needed.   ? apixaban (ELIQUIS) 2.5 MG TABS tablet TAKE ONE TABLET BY MOUTH TWICE DAILY  ? brimonidine-timolol (COMBIGAN) 0.2-0.5 % ophthalmic solution Place 1 drop into both eyes every 12 (twelve) hours.  ? Cholecalciferol (VITAMIN D3) 50 MCG (2000 UT) capsule Take 1 capsule by mouth daily.   ? diltiazem (CARDIZEM CD) 120 MG 24 hr capsule TAKE ONE CAPSULE BY MOUTH ONCE DAILY  ? hydrochlorothiazide (MICROZIDE) 12.5 MG capsule Take 1 capsule (12.5 mg total) by mouth daily.  ? latanoprost (XALATAN) 0.005 % ophthalmic solution INSTILL 1 DROP INTO BOTH EYES EVERY DAY AT NIGHT  ? tamsulosin (FLOMAX) 0.4 MG CAPS capsule TAKE ONE CAPSULE BY MOUTH EVERY EVENING AFTER SUPPER  ? [DISCONTINUED] potassium chloride (KLOR-CON) 10 MEQ tablet Take 1 tablet (10 mEq total) by mouth daily.  ?  ? ?Allergies:   Benadryl [diphenhydramine hcl], Ceftriaxone sodium, Contrast media [iodinated contrast media], Nsaids, Penicillins, Codeine, Ibuprofen, Levaquin [levofloxacin], Oxybutynin, Tramadol hcl, Chlorhexidine, and Furosemide  ? ?Social  History  ? ?Socioeconomic History  ? Marital status: Married  ?  Spouse name: Not on file  ? Number of children: 3  ? Years of education: Not on file  ? Highest education level: Not on file  ?Occupational History  ?  Employer: RETIRED  ?Tobacco Use  ? Smoking status: Never  ? Smokeless tobacco: Never  ?Vaping Use  ? Vaping Use: Never used  ?Substance and Sexual Activity  ? Alcohol use: No  ? Drug use: No  ? Sexual activity: Not Currently  ?Other Topics Concern  ? Not on file  ?Social History Narrative  ? Not  on file  ? ?Social Determinants of Health  ? ?Financial Resource Strain: Not on file  ?Food Insecurity: Not on file  ?Transportation Needs: Not on file  ?Physical Activity: Not on file  ?Stress: Not on file  ?Social Connections: Not on file  ?  ? ?Family History: ?The patient's family history includes Kidney Stones in his brother; Liver disease in his sister, sister, and sister; Melanoma in his brother; Polymyositis in his mother; Prostate cancer in his brother and father. ? ?ROS:   ?Please see the history of present illness.    ?Follows at the cancer center twice a year.  All other systems reviewed and are negative. ? ?EKGs/Labs/Other Studies Reviewed:   ? ?The following studies were reviewed today: ?Denies any new or recent imaging. ? ?2D Doppler echocardiogram: 05/28/2021: ?IMPRESSIONS  ? 1. Left ventricular ejection fraction, by estimation, is 55 to 60%. The  ?left ventricle has normal function. The left ventricle has no regional  ?wall motion abnormalities. There is mild left ventricular hypertrophy.  ?Left ventricular diastolic parameters  ?are indeterminate.  ? 2. Right ventricular systolic function is normal. The right ventricular  ?size is normal.  ? 3. Left atrial size was mildly dilated.  ? 4. Mild mitral valve regurgitation.  ? 5. AV is thickened, calcified with restricted motion Peak and mean  ?gradients through the valve are 38 and 19 mm Hg respectively AVA (VTI) is  ?0.99cm2. Dimensionless index is 0.26 consistent with low gradient severe  ?AS. Compared to echo from 2021, mild  ?increase in mean gradient (15 to 19 mmHg) and dimensionless index is  ?decreased (0.31 to 0.26). Aortic valve regurgitation is mild to moderate.  ? 6. Aortic dilatation noted. There is mild dilatation of the ascending  ?aorta, measuring 41 mm.  ? ?EKG:  EKG EKG is not performed today. ? ?Recent Labs: ?05/10/2021: NT-Pro BNP 1,171 ?09/15/2021: ALT 13; BUN 25; Creatinine 1.66; Hemoglobin 15.2; Platelet Count 180; Potassium 4.0;  Sodium 138  ?Recent Lipid Panel ?   ?Component Value Date/Time  ? CHOL 164 02/25/2020 0842  ? TRIG 131 02/25/2020 0842  ? HDL 41 02/25/2020 0842  ? CHOLHDL 4.0 02/25/2020 0842  ? LDLCALC 100 (H) 02/25/2020 5701  ? ? ?Physical Exam:   ? ?VS:  BP 98/60   Pulse 90   Ht _0  (1.626 m)   Wt 167 lb 6.4 oz (75.9 kg)   SpO2 95%   BMI 28.73 kg/m?    ? ?Wt Readings from Last 3 Encounters:  ?01/10/22 167 lb 6.4 oz (75.9 kg)  ?09/22/21 164 lb 9.6 oz (74.7 kg)  ?08/31/21 163 lb 9.6 oz (74.2 kg)  ?  ? ?GEN: Overweight. No acute distress ?HEENT: Normal ?NECK: No JVD. ?LYMPHATICS: No lymphadenopathy ?CARDIAC: Apical systolic with radiation right upper sternal 1-2/6 murmur. irregularly irregular without gallop, or edema. ?VASCULAR:  Normal Pulses.  No bruits. ?RESPIRATORY:  Clear to auscultation without rales, wheezing or rhonchi  ?ABDOMEN: Soft, non-tender, non-distended, No pulsatile mass, ?MUSCULOSKELETAL: No deformity  ?SKIN: Warm and dry ?NEUROLOGIC:  Alert and oriented x 3 ?PSYCHIATRIC:  Normal affect  ? ?ASSESSMENT:   ? ?1. Moderate aortic stenosis   ?2. Persistent atrial fibrillation ([place])   ?3. Chronic diastolic heart failure ([place])   ?4. OSA on CPAP   ?5. Chronic anticoagulation   ?6. Multiple myeloma not having achieved remission ([place])   ?7. Stage 3b chronic kidney disease ([place])   ? ?PLAN:   ? ?In order of problems listed above: ? ?We will do an echocardiogram on the next office visit in 6 months with repeat echo.  There was suggestion of low-flow low gradient severe aortic stenosis last August.  He is asymptomatic.  ?No issues.  Anticoagulation without bleeding. ?No evidence of volume overload. ?Compliant ?Continue low-dose apixaban ?Being managed at oncology clinic ?Stable.  Blood work will be done again tomorrow. ? ? ? ?Natural history of aortic valve stenosis was discussed in detail.  Cardinal symptoms of angina, syncope, and dyspnea were reviewed and significance relative to prognosis was described.  The  importance of sequential imaging for disease monitoring was emphasized.  Work-up including possible heart catheterization and CT angiography were described as essential components of staging for therapy.  Treatment op

## 2022-01-10 ENCOUNTER — Ambulatory Visit: Payer: Medicare Other | Admitting: Interventional Cardiology

## 2022-01-10 ENCOUNTER — Encounter: Payer: Self-pay | Admitting: Interventional Cardiology

## 2022-01-10 ENCOUNTER — Encounter: Payer: Self-pay | Admitting: *Deleted

## 2022-01-10 VITALS — BP 98/60 | HR 90 | Ht 64.0 in | Wt 167.4 lb

## 2022-01-10 DIAGNOSIS — Z9989 Dependence on other enabling machines and devices: Secondary | ICD-10-CM | POA: Diagnosis not present

## 2022-01-10 DIAGNOSIS — I350 Nonrheumatic aortic (valve) stenosis: Secondary | ICD-10-CM | POA: Diagnosis not present

## 2022-01-10 DIAGNOSIS — I4819 Other persistent atrial fibrillation: Secondary | ICD-10-CM

## 2022-01-10 DIAGNOSIS — I5032 Chronic diastolic (congestive) heart failure: Secondary | ICD-10-CM

## 2022-01-10 DIAGNOSIS — N1832 Chronic kidney disease, stage 3b: Secondary | ICD-10-CM | POA: Diagnosis not present

## 2022-01-10 DIAGNOSIS — G4733 Obstructive sleep apnea (adult) (pediatric): Secondary | ICD-10-CM

## 2022-01-10 DIAGNOSIS — Z7901 Long term (current) use of anticoagulants: Secondary | ICD-10-CM | POA: Diagnosis not present

## 2022-01-10 DIAGNOSIS — C9000 Multiple myeloma not having achieved remission: Secondary | ICD-10-CM | POA: Diagnosis not present

## 2022-01-10 NOTE — Patient Instructions (Addendum)
Medication Instructions:  ?Your physician recommends that you continue on your current medications as directed. Please refer to the Current Medication list given to you today. ? ?*If you need a refill on your cardiac medications before your next appointment, please call your pharmacy* ? ? ?Lab Work: ?None ?If you have labs (blood work) drawn today and your tests are completely normal, you will receive your results only by: ?MyChart Message (if you have MyChart) OR ?A paper copy in the mail ?If you have any lab test that is abnormal or we need to change your treatment, we will call you to review the results. ? ? ?Testing/Procedures: ?Your physician has requested that you have an echocardiogram 1-2 weeks prior to seeing Dr. Tamala Julian back. Echocardiography is a painless test that uses sound waves to create images of your heart. It provides your doctor with information about the size and shape of your heart and how well your heart?s chambers and valves are working. This procedure takes approximately one hour. There are no restrictions for this procedure. ? ? ? ?Follow-Up: ?At [facility], you and your health needs are our priority.  As part of our continuing mission to provide you with exceptional heart care, we have created designated Provider Care Teams.  These Care Teams include your primary Cardiologist (physician) and Advanced Practice Providers (APPs -  Physician Assistants and Nurse Practitioners) who all work together to provide you with the care you need, when you need it. ? ?We recommend signing up for the patient portal called "MyChart".  Sign up information is provided on this After Visit Summary.  MyChart is used to connect with patients for Virtual Visits (Telemedicine).  Patients are able to view lab/test results, encounter notes, upcoming appointments, etc.  Non-urgent messages can be sent to your provider as well.   ?To learn more about what you can do with MyChart, go to [email or web address].    ? ?Your next appointment:   ?6 month ? ?The format for your next appointment:   ?In Person ? ?Provider:   ?[name], MD  ? ? ?Other Instructions ? ? ?Important Information About [place] ? ? ? ? ?  ?

## 2022-01-11 ENCOUNTER — Inpatient Hospital Stay: Payer: Medicare Other | Attending: Internal Medicine

## 2022-01-11 ENCOUNTER — Other Ambulatory Visit: Payer: Self-pay

## 2022-01-11 DIAGNOSIS — C9000 Multiple myeloma not having achieved remission: Secondary | ICD-10-CM | POA: Insufficient documentation

## 2022-01-11 DIAGNOSIS — Z886 Allergy status to analgesic agent status: Secondary | ICD-10-CM | POA: Diagnosis not present

## 2022-01-11 DIAGNOSIS — Z9049 Acquired absence of other specified parts of digestive tract: Secondary | ICD-10-CM | POA: Insufficient documentation

## 2022-01-11 DIAGNOSIS — Z9079 Acquired absence of other genital organ(s): Secondary | ICD-10-CM | POA: Insufficient documentation

## 2022-01-11 DIAGNOSIS — C61 Malignant neoplasm of prostate: Secondary | ICD-10-CM | POA: Insufficient documentation

## 2022-01-11 DIAGNOSIS — I5032 Chronic diastolic (congestive) heart failure: Secondary | ICD-10-CM | POA: Diagnosis not present

## 2022-01-11 DIAGNOSIS — Z79899 Other long term (current) drug therapy: Secondary | ICD-10-CM | POA: Insufficient documentation

## 2022-01-11 DIAGNOSIS — Z7969 Long term (current) use of other immunomodulators and immunosuppressants: Secondary | ICD-10-CM | POA: Diagnosis not present

## 2022-01-11 DIAGNOSIS — Z881 Allergy status to other antibiotic agents status: Secondary | ICD-10-CM | POA: Diagnosis not present

## 2022-01-11 DIAGNOSIS — Z880 Allergy status to penicillin: Secondary | ICD-10-CM | POA: Insufficient documentation

## 2022-01-11 DIAGNOSIS — Z888 Allergy status to other drugs, medicaments and biological substances status: Secondary | ICD-10-CM | POA: Insufficient documentation

## 2022-01-11 DIAGNOSIS — G4733 Obstructive sleep apnea (adult) (pediatric): Secondary | ICD-10-CM | POA: Insufficient documentation

## 2022-01-11 DIAGNOSIS — I480 Paroxysmal atrial fibrillation: Secondary | ICD-10-CM | POA: Insufficient documentation

## 2022-01-11 DIAGNOSIS — Z885 Allergy status to narcotic agent status: Secondary | ICD-10-CM | POA: Insufficient documentation

## 2022-01-11 DIAGNOSIS — Z7901 Long term (current) use of anticoagulants: Secondary | ICD-10-CM | POA: Diagnosis not present

## 2022-01-11 LAB — CBC WITH DIFFERENTIAL (CANCER CENTER ONLY)
Abs Immature Granulocytes: 0.04 10*3/uL (ref 0.00–0.07)
Basophils Absolute: 0 10*3/uL (ref 0.0–0.1)
Basophils Relative: 0 %
Eosinophils Absolute: 0.1 10*3/uL (ref 0.0–0.5)
Eosinophils Relative: 2 %
HCT: 43.6 % (ref 39.0–52.0)
Hemoglobin: 14.9 g/dL (ref 13.0–17.0)
Immature Granulocytes: 1 %
Lymphocytes Relative: 20 %
Lymphs Abs: 0.9 10*3/uL (ref 0.7–4.0)
MCH: 31.9 pg (ref 26.0–34.0)
MCHC: 34.2 g/dL (ref 30.0–36.0)
MCV: 93.4 fL (ref 80.0–100.0)
Monocytes Absolute: 0.5 10*3/uL (ref 0.1–1.0)
Monocytes Relative: 12 %
Neutro Abs: 3 10*3/uL (ref 1.7–7.7)
Neutrophils Relative %: 65 %
Platelet Count: 160 10*3/uL (ref 150–400)
RBC: 4.67 MIL/uL (ref 4.22–5.81)
RDW: 14.6 % (ref 11.5–15.5)
WBC Count: 4.5 10*3/uL (ref 4.0–10.5)
nRBC: 0 % (ref 0.0–0.2)

## 2022-01-11 LAB — CMP (CANCER CENTER ONLY)
ALT: 10 U/L (ref 0–44)
AST: 16 U/L (ref 15–41)
Albumin: 4 g/dL (ref 3.5–5.0)
Alkaline Phosphatase: 91 U/L (ref 38–126)
Anion gap: 4 — ABNORMAL LOW (ref 5–15)
BUN: 24 mg/dL — ABNORMAL HIGH (ref 8–23)
CO2: 28 mmol/L (ref 22–32)
Calcium: 10.3 mg/dL (ref 8.9–10.3)
Chloride: 107 mmol/L (ref 98–111)
Creatinine: 1.61 mg/dL — ABNORMAL HIGH (ref 0.61–1.24)
GFR, Estimated: 42 mL/min — ABNORMAL LOW (ref >60)
Glucose, Bld: 88 mg/dL (ref 70–99)
Potassium: 4 mmol/L (ref 3.5–5.1)
Sodium: 139 mmol/L (ref 135–145)
Total Bilirubin: 0.6 mg/dL (ref 0.3–1.2)
Total Protein: 6.5 g/dL (ref 6.5–8.1)

## 2022-01-11 LAB — LACTATE DEHYDROGENASE: LDH: 159 U/L (ref 98–192)

## 2022-01-12 LAB — BETA 2 MICROGLOBULIN, SERUM: Beta-2 Microglobulin: 3 mg/L — ABNORMAL HIGH (ref 0.6–2.4)

## 2022-01-12 LAB — KAPPA/LAMBDA LIGHT CHAINS
Kappa free light chain: 79 mg/L — ABNORMAL HIGH (ref 3.3–19.4)
Kappa, lambda light chain ratio: 5.49 — ABNORMAL HIGH (ref 0.26–1.65)
Lambda free light chains: 14.4 mg/L (ref 5.7–26.3)

## 2022-01-12 LAB — IGG, IGA, IGM
IgA: 62 mg/dL (ref 61–437)
IgG (Immunoglobin G), Serum: 1011 mg/dL (ref 603–1613)
IgM (Immunoglobulin M), Srm: 39 mg/dL (ref 15–143)

## 2022-01-18 ENCOUNTER — Other Ambulatory Visit: Payer: Self-pay

## 2022-01-18 ENCOUNTER — Inpatient Hospital Stay: Payer: Medicare Other | Admitting: Internal Medicine

## 2022-01-18 ENCOUNTER — Encounter: Payer: Self-pay | Admitting: Internal Medicine

## 2022-01-18 VITALS — BP 121/80 | HR 71 | Temp 97.3°F | Resp 18 | Ht 64.0 in | Wt 166.8 lb

## 2022-01-18 DIAGNOSIS — Z7969 Long term (current) use of other immunomodulators and immunosuppressants: Secondary | ICD-10-CM | POA: Diagnosis not present

## 2022-01-18 DIAGNOSIS — Z79899 Other long term (current) drug therapy: Secondary | ICD-10-CM | POA: Diagnosis not present

## 2022-01-18 DIAGNOSIS — Z886 Allergy status to analgesic agent status: Secondary | ICD-10-CM | POA: Diagnosis not present

## 2022-01-18 DIAGNOSIS — I5032 Chronic diastolic (congestive) heart failure: Secondary | ICD-10-CM | POA: Diagnosis not present

## 2022-01-18 DIAGNOSIS — Z9049 Acquired absence of other specified parts of digestive tract: Secondary | ICD-10-CM | POA: Diagnosis not present

## 2022-01-18 DIAGNOSIS — Z7901 Long term (current) use of anticoagulants: Secondary | ICD-10-CM | POA: Diagnosis not present

## 2022-01-18 DIAGNOSIS — Z881 Allergy status to other antibiotic agents status: Secondary | ICD-10-CM | POA: Diagnosis not present

## 2022-01-18 DIAGNOSIS — I480 Paroxysmal atrial fibrillation: Secondary | ICD-10-CM | POA: Diagnosis not present

## 2022-01-18 DIAGNOSIS — Z885 Allergy status to narcotic agent status: Secondary | ICD-10-CM | POA: Diagnosis not present

## 2022-01-18 DIAGNOSIS — C61 Malignant neoplasm of prostate: Secondary | ICD-10-CM | POA: Diagnosis not present

## 2022-01-18 DIAGNOSIS — Z880 Allergy status to penicillin: Secondary | ICD-10-CM | POA: Diagnosis not present

## 2022-01-18 DIAGNOSIS — C9000 Multiple myeloma not having achieved remission: Secondary | ICD-10-CM | POA: Diagnosis not present

## 2022-01-18 DIAGNOSIS — G4733 Obstructive sleep apnea (adult) (pediatric): Secondary | ICD-10-CM | POA: Diagnosis not present

## 2022-01-18 DIAGNOSIS — Z888 Allergy status to other drugs, medicaments and biological substances status: Secondary | ICD-10-CM | POA: Diagnosis not present

## 2022-01-18 NOTE — Progress Notes (Signed)
?    [facility] ?[phone]   [phone] ? ?OFFICE PROGRESS NOTE ? ?[name], Cynthia, MD ?[address] ?[name] [address] ? 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 ?2) History of pheochromocytoma LEFT adrenal gland status post resection on 03/16/2009 ?3) Prostate adenocarcinoma ? ?PRIOR THERAPY:  ?1)Systemic therapy with weekly Velcade 1.3 mg/KG subcutaneously, Revlimid 25 mg p.o. daily for 21 days every 4 weeks as well as Decadron 20 mg p.o. weekly.  First dose November 07, 2017.  Status post 7 cycles. ?2) definitive radiotherapy to the prostate cancer under the care of Dr. Manning. ? ?CURRENT THERAPY: Observation. ? ?INTERVAL HISTORY: ?Thomas [name] 87 y.o. male returns to the clinic today for follow-up visit accompanied by his wife.  The patient is feeling fine today with no concerning complaints.  He denied having any current chest pain, shortness of breath, cough or hemoptysis.  He has no nausea, vomiting, diarrhea or constipation.  He has no headache or visual changes.  He has no recent weight loss or night sweats.  He has been on observation now for several years and doing fine.  He is here today for evaluation and repeat myeloma panel. ?  ? ?MEDICAL HISTORY: ?Past Medical History:  ?Diagnosis Date  ? BPH (benign prostatic hypertrophy)   ? Chronic diastolic heart failure (HCC) 12/27/2013  ? Chronic kidney disease   ? Hx of bilateral renal cysts.  ? Encounter for antineoplastic chemotherapy 02/20/2018  ? Glaucoma   ? Heart murmur   ? History of atrial fibrillation 03/09/09  ? Hx of epiglottitis   ? 2011  ? Hypercholesterolemia   ? Hypertension   ? Long term current use of amiodarone 11/07/2013  ? Amiodarone   ? Mild aortic stenosis   ? OSA on CPAP 06/26/2014  ? Paroxysmal atrial fibrillation (HCC) 11/07/2013  ? Prostate cancer (HCC) 7/12  ? T1C Ad Ca  s/p TURP, observation  only  ? Renal insufficiency 09/06/2011  ? Sinus node dysfunction (HCC) 08/14/2014  ? Sleep apnea   ? Smoldering multiple myeloma   ? ? ?ALLERGIES:  is allergic to benadryl [diphenhydramine hcl], ceftriaxone sodium, contrast media [iodinated contrast media], nsaids, penicillins, codeine, ibuprofen, levaquin [levofloxacin], oxybutynin, tramadol hcl, chlorhexidine, and furosemide. ? ?MEDICATIONS:  ?Current Outpatient Medications  ?Medication Sig Dispense Refill  ? acetaminophen (TYLENOL) 500 MG tablet Take 1,000 mg by mouth as needed.     ? apixaban (ELIQUIS) 2.5 MG TABS tablet TAKE ONE TABLET BY MOUTH TWICE DAILY 180 tablet 1  ? brimonidine-timolol (COMBIGAN) 0.2-0.5 % ophthalmic solution Place 1 drop into both eyes every 12 (twelve) hours.    ? Cholecalciferol (VITAMIN D3) 50 MCG (2000 UT) capsule Take 1 capsule by mouth daily.     ? diltiazem (CARDIZEM CD) 120 MG 24 hr capsule TAKE ONE CAPSULE BY MOUTH ONCE DAILY 90 capsule 1  ? dorzolamide-timolol (COSOPT) 22.3-6.8 MG/ML ophthalmic solution 1 drop 2 (two) times daily.    ? hydrochlorothiazide (MICROZIDE) 12.5 MG capsule Take 1 capsule (12.5 mg total) by mouth daily. 90 capsule 3  ? latanoprost (XALATAN) 0.005 % ophthalmic solution INSTILL 1 DROP INTO BOTH EYES EVERY DAY AT NIGHT  6  ? tamsulosin (FLOMAX) 0.4 MG CAPS capsule TAKE ONE CAPSULE BY MOUTH EVERY EVENING AFTER SUPPER 30 capsule 5  ? ?No current facility-administered medications for this visit.  ? ? ?SURGICAL HISTORY:  ?Past   Surgical History:  ?Procedure Laterality Date  ? ADRENALECTOMY  02/12/11  ? Left, for Pheochromocytoma  ? CARDIOVERSION N/A 12/26/2013  ? Procedure: CARDIOVERSION;  Surgeon: [name], MD;  Location: [place];  Service: Cardiovascular;  Laterality: N/A;  ? CHOLECYSTECTOMY  02/12/11  ? GASTRECTOMY  1984  ? S/P gastrectomy for ulcer  ? KNEE SURGERY  1971  ? S/P Right knee surgery  ? TEE WITHOUT CARDIOVERSION N/A 12/26/2013  ? Procedure: TRANSESOPHAGEAL ECHOCARDIOGRAM (TEE);  Surgeon:  [name], MD;  Location: [facility];  Service: Cardiovascular;  Laterality: N/A;  ? TRANSURETHRAL RESECTION OF PROSTATE  7/12  ? ? ?REVIEW OF SYSTEMS:  A comprehensive review of systems was negative except for: Constitutional: positive for fatigue  ? ?PHYSICAL EXAMINATION: General appearance: alert, cooperative and no distress ?Head: Normocephalic, without obvious abnormality, atraumatic ?Neck: no adenopathy, no JVD, supple, symmetrical, trachea midline and thyroid not enlarged, symmetric, no tenderness/mass/nodules ?Lymph nodes: Cervical, supraclavicular, and axillary nodes normal. ?Resp: clear to auscultation bilaterally ?Back: symmetric, no curvature. ROM normal. No CVA tenderness. ?Cardio: regular rate and rhythm, S1, S2 normal, no murmur, click, rub or gallop ?GI: soft, non-tender; bowel sounds normal; no masses,  no organomegaly ?Extremities: extremities normal, atraumatic, no cyanosis or edema ? ?ECOG PERFORMANCE STATUS: 1 - Symptomatic but completely ambulatory ? ?Blood pressure 121/80, pulse 71, temperature (!) 97.3 ?F (36.3 ?C), temperature source Oral, resp. rate 18, height 5' 4" (1.626 m), weight 166 lb 12.8 oz (75.7 kg), SpO2 100 %. ? ?LABORATORY DATA: ?Lab Results  ?Component Value Date  ? WBC 4.5 01/11/2022  ? HGB 14.9 01/11/2022  ? HCT 43.6 01/11/2022  ? MCV 93.4 01/11/2022  ? PLT 160 01/11/2022  ? ? ?  Chemistry   ?   ?Component Value Date/Time  ? NA 139 01/11/2022 1104  ? NA 139 06/04/2021 1041  ? NA 138 08/16/2017 1404  ? K 4.0 01/11/2022 1104  ? K 4.5 08/16/2017 1404  ? CL 107 01/11/2022 1104  ? CL 109 (H) 03/21/2013 2725  ? CO2 28 01/11/2022 1104  ? CO2 24 08/16/2017 1404  ? BUN 24 (H) 01/11/2022 1104  ? BUN 21 06/04/2021 1041  ? BUN 17.3 08/16/2017 1404  ? CREATININE 1.61 (H) 01/11/2022 1104  ? CREATININE 1.8 (H) 08/16/2017 1404  ?    ?Component Value Date/Time  ? CALCIUM 10.3 01/11/2022 1104  ? CALCIUM 9.9 08/16/2017 1404  ? ALKPHOS 91 01/11/2022 1104  ? ALKPHOS 86 08/16/2017 1404  ? AST  16 01/11/2022 1104  ? AST 22 08/16/2017 1404  ? ALT 10 01/11/2022 1104  ? ALT 16 08/16/2017 1404  ? BILITOT 0.6 01/11/2022 1104  ? BILITOT 0.78 08/16/2017 1404  ?  ? ? ? ?RADIOGRAPHIC STUDIES: ?No results found. ? ?ASSESSMENT AND PLAN:  ?This is a very pleasant 87 years old [name] male with IgG smoldering Loma diagnosed in April 2011. The patient has been on observation since 2011. ?His recent myeloma panel showed further increase in the free kappa light chain. ?His recent skeletal bone survey showed no concerning findings for disease progression.  He has a focus of avascular necrosis but no significant lytic lesions. ?His bone marrow bone marrow biopsy and aspirate showed further increase in the plasma cells to 15%. ?The patient underwent treatment with subcutaneous weekly Velcade as well as Revlimid and Decadron status post 7 cycles. ?The patient has been in observation since that time and he is feeling fine with no concerning complaints. ?The patient has no  complaints today. ?He had repeat myeloma panel performed recently.  I discussed the lab results with the patient and his wife.  It showed no concerning findings for disease progression. ?I recommended for him to continue on observation with repeat myeloma panel in 6 months. ?The patient was advised to call immediately if he has any other concerning symptoms in the interval. ?For the history of prostate cancer he is currently followed by Dr. Alinda Money and Dr. Alen Blew for hormonal therapy. ? ?The patient voices understanding of current disease status and treatment options and is in agreement with the current care plan. ?All questions were answered. The patient knows to call the clinic with any problems, questions or concerns. We can certainly see the patient much sooner if necessary. ? ?Disclaimer: This note was dictated with voice recognition software. Similar sounding words can inadvertently be transcribed and may not be corrected upon review. ?  ? ?  ?  ?

## 2022-01-24 DIAGNOSIS — G4733 Obstructive sleep apnea (adult) (pediatric): Secondary | ICD-10-CM | POA: Diagnosis not present

## 2022-03-17 ENCOUNTER — Telehealth: Payer: Self-pay | Admitting: Interventional Cardiology

## 2022-03-17 DIAGNOSIS — I480 Paroxysmal atrial fibrillation: Secondary | ICD-10-CM

## 2022-03-17 MED ORDER — APIXABAN 2.5 MG PO TABS: 2.5000 mg | ORAL_TABLET | Freq: Two times a day (BID) | ORAL | 1 refills | Status: DC

## 2022-03-17 NOTE — Telephone Encounter (Signed)
Prescription refill request for Eliquis received. Indication:Afib  Last office visit: 01/10/22 Thomas [name])  Scr: 1.61 (01/11/22)  Age: 87 Weight: 75.7kg  Appropriate dose and refill sent to requested pharmacy.

## 2022-03-17 NOTE — Telephone Encounter (Signed)
*  STAT* If patient is at the pharmacy, call can be transferred to refill team.   1. Which medications need to be refilled? (please list name of each medication and dose if known) apixaban (ELIQUIS) 2.5 MG TABS tablet  2. Which pharmacy/location (including street and city if local pharmacy) is medication to be sent to? [id number] [place], [place] - [address] 3. Do they need a 30 day or 90 day supply? [address]

## 2022-03-20 ENCOUNTER — Other Ambulatory Visit: Payer: Self-pay | Admitting: Interventional Cardiology

## 2022-05-25 ENCOUNTER — Other Ambulatory Visit: Payer: Self-pay

## 2022-05-25 ENCOUNTER — Emergency Department (HOSPITAL_BASED_OUTPATIENT_CLINIC_OR_DEPARTMENT_OTHER): Payer: Medicare Other

## 2022-05-25 ENCOUNTER — Encounter (HOSPITAL_COMMUNITY): Payer: Self-pay

## 2022-05-25 ENCOUNTER — Emergency Department (HOSPITAL_BASED_OUTPATIENT_CLINIC_OR_DEPARTMENT_OTHER)
Admission: EM | Admit: 2022-05-25 | Discharge: 2022-05-25 | Disposition: A | Discharge status: Home / Self Care | Payer: Medicare Other | Attending: Emergency Medicine | Admitting: Emergency Medicine

## 2022-05-25 ENCOUNTER — Encounter (HOSPITAL_BASED_OUTPATIENT_CLINIC_OR_DEPARTMENT_OTHER): Payer: Self-pay

## 2022-05-25 DIAGNOSIS — R42 Dizziness and giddiness: Secondary | ICD-10-CM | POA: Diagnosis not present

## 2022-05-25 DIAGNOSIS — I509 Heart failure, unspecified: Secondary | ICD-10-CM | POA: Diagnosis not present

## 2022-05-25 DIAGNOSIS — Z20822 Contact with and (suspected) exposure to covid-19: Secondary | ICD-10-CM | POA: Insufficient documentation

## 2022-05-25 DIAGNOSIS — M47814 Spondylosis without myelopathy or radiculopathy, thoracic region: Secondary | ICD-10-CM | POA: Diagnosis not present

## 2022-05-25 DIAGNOSIS — X58XXXA Exposure to other specified factors, initial encounter: Secondary | ICD-10-CM | POA: Diagnosis not present

## 2022-05-25 DIAGNOSIS — Z7901 Long term (current) use of anticoagulants: Secondary | ICD-10-CM | POA: Insufficient documentation

## 2022-05-25 DIAGNOSIS — R0609 Other forms of dyspnea: Secondary | ICD-10-CM

## 2022-05-25 DIAGNOSIS — M47812 Spondylosis without myelopathy or radiculopathy, cervical region: Secondary | ICD-10-CM | POA: Diagnosis not present

## 2022-05-25 DIAGNOSIS — I110 Hypertensive heart disease with heart failure: Secondary | ICD-10-CM | POA: Insufficient documentation

## 2022-05-25 DIAGNOSIS — I4891 Unspecified atrial fibrillation: Secondary | ICD-10-CM | POA: Diagnosis not present

## 2022-05-25 DIAGNOSIS — R0602 Shortness of breath: Secondary | ICD-10-CM | POA: Diagnosis not present

## 2022-05-25 DIAGNOSIS — J449 Chronic obstructive pulmonary disease, unspecified: Secondary | ICD-10-CM | POA: Diagnosis not present

## 2022-05-25 DIAGNOSIS — U071 COVID-19: Secondary | ICD-10-CM

## 2022-05-25 DIAGNOSIS — S0990XA Unspecified injury of head, initial encounter: Secondary | ICD-10-CM

## 2022-05-25 DIAGNOSIS — J45909 Unspecified asthma, uncomplicated: Secondary | ICD-10-CM | POA: Diagnosis not present

## 2022-05-25 DIAGNOSIS — M4312 Spondylolisthesis, cervical region: Secondary | ICD-10-CM | POA: Diagnosis not present

## 2022-05-25 DIAGNOSIS — Z79899 Other long term (current) drug therapy: Secondary | ICD-10-CM | POA: Insufficient documentation

## 2022-05-25 DIAGNOSIS — Z8546 Personal history of malignant neoplasm of prostate: Secondary | ICD-10-CM | POA: Diagnosis not present

## 2022-05-25 LAB — URINALYSIS, MICROSCOPIC (REFLEX): WBC, UA: NONE SEEN WBC/hpf (ref 0–5)

## 2022-05-25 LAB — CBC
HCT: 43 % (ref 39.0–52.0)
Hemoglobin: 15.1 g/dL (ref 13.0–17.0)
MCH: 31.9 pg (ref 26.0–34.0)
MCHC: 35.1 g/dL (ref 30.0–36.0)
MCV: 90.9 fL (ref 80.0–100.0)
Platelets: 142 10*3/uL — ABNORMAL LOW (ref 150–400)
RBC: 4.73 MIL/uL (ref 4.22–5.81)
RDW: 14.4 % (ref 11.5–15.5)
WBC: 6.1 10*3/uL (ref 4.0–10.5)
nRBC: 0 % (ref 0.0–0.2)

## 2022-05-25 LAB — TROPONIN I (HIGH SENSITIVITY)
Troponin I (High Sensitivity): 14 ng/L (ref <18)
Troponin I (High Sensitivity): 16 ng/L (ref <18)

## 2022-05-25 LAB — I-STAT VENOUS BLOOD GAS, ED
Acid-Base Excess: 1 mmol/L (ref 0.0–2.0)
Bicarbonate: 25 mmol/L (ref 20.0–28.0)
Calcium, Ion: 1.26 mmol/L (ref 1.15–1.40)
HCT: 42 % (ref 39.0–52.0)
Hemoglobin: 14.3 g/dL (ref 13.0–17.0)
O2 Saturation: 32 %
Patient temperature: 97.7
Potassium: 4.1 mmol/L (ref 3.5–5.1)
Sodium: 135 mmol/L (ref 135–145)
TCO2: 26 mmol/L (ref 22–32)
pCO2, Ven: 37.9 mmHg — ABNORMAL LOW (ref 44–60)
pH, Ven: 7.424 (ref 7.25–7.43)
pO2, Ven: 19 mmHg — CL (ref 32–45)

## 2022-05-25 LAB — BASIC METABOLIC PANEL
Anion gap: 9 (ref 5–15)
BUN: 22 mg/dL (ref 8–23)
CO2: 23 mmol/L (ref 22–32)
Calcium: 9.6 mg/dL (ref 8.9–10.3)
Chloride: 99 mmol/L (ref 98–111)
Creatinine, Ser: 1.8 mg/dL — ABNORMAL HIGH (ref 0.61–1.24)
GFR, Estimated: 36 mL/min — ABNORMAL LOW (ref >60)
Glucose, Bld: 133 mg/dL — ABNORMAL HIGH (ref 70–99)
Potassium: 3.7 mmol/L (ref 3.5–5.1)
Sodium: 131 mmol/L — ABNORMAL LOW (ref 135–145)

## 2022-05-25 LAB — RESP PANEL BY RT-PCR (FLU A&B, COVID) ARPGX2
Influenza A by PCR: NEGATIVE
Influenza B by PCR: NEGATIVE
SARS Coronavirus 2 by RT PCR: POSITIVE — AB

## 2022-05-25 LAB — BRAIN NATRIURETIC PEPTIDE: B Natriuretic Peptide: 229.4 pg/mL — ABNORMAL HIGH (ref 0.0–100.0)

## 2022-05-25 LAB — URINALYSIS, ROUTINE W REFLEX MICROSCOPIC
Bilirubin Urine: NEGATIVE
Glucose, UA: NEGATIVE mg/dL
Ketones, ur: NEGATIVE mg/dL
Leukocytes,Ua: NEGATIVE
Nitrite: NEGATIVE
Protein, ur: NEGATIVE mg/dL
Specific Gravity, Urine: 1.015 (ref 1.005–1.030)
pH: 7 (ref 5.0–8.0)

## 2022-05-25 MED ORDER — MOLNUPIRAVIR EUA 200MG CAPSULE: 4.0000 | ORAL_CAPSULE | Freq: Two times a day (BID) | ORAL | 0 refills | Status: AC

## 2022-05-25 MED ORDER — METOPROLOL TARTRATE 5 MG/5ML IV SOLN
2.5000 mg | Freq: Once | INTRAVENOUS | Status: AC
  Administered 2022-05-25: 2.5 mg via INTRAVENOUS
  Filled 2022-05-25: qty 5

## 2022-05-25 MED ORDER — SODIUM CHLORIDE 0.9 % IV BOLUS
500.0000 mL | Freq: Once | INTRAVENOUS | Status: AC
  Administered 2022-05-25: 500 mL via INTRAVENOUS

## 2022-05-25 MED ORDER — ACETAMINOPHEN 500 MG PO TABS
1000.0000 mg | ORAL_TABLET | Freq: Once | ORAL | Status: AC
  Administered 2022-05-25: 1000 mg via ORAL
  Filled 2022-05-25: qty 2

## 2022-05-25 NOTE — ED Provider Notes (Signed)
[facility] Provider Note   CSN: [id number] Arrival date & time: 05/25/22  1333     History  Chief Complaint  Patient presents with   Head Injury   Shortness of Breath    Thomas [name] is a 87 y.o. male.  Patient as above with significant medical history as below, including A-fib on Eliquis, chronic diastolic heart failure, HTN, HLD, also myeloma who presents to the ED with complaint of fall with head injury, AMS.  Reports approx 1 week ago he stood up quickly from his chair, he fell forward and struck his head on the end table and then again on the floor.  Believes he had a brief LOC less than 5 seconds.  No headache, no nausea or vomiting following this.  He has been compliant with his Eliquis, last dose was this morning.  Pt reports multiple falls over the past 6 mos or so. Reports that he will stand up and feel dizzy/lightheaded and have the sensation that he is going to fall and can sometimes sit down prior to this occurring but has fallen multiple times.   Also reports over the past week worsened exertional dyspnea from his baseline, reports that his PCP was concerned about fluid in his heart or lungs. Reports ongoing over last month or so but worse in the last week. He uses CPAP qhs with good compliance and otherwise no home oxygen use. No fevers or chills, no cough/congestion/uri s/s. Reports increased fatigue last few days, reduced appetite.      Past Medical History:  Diagnosis Date   BPH (benign prostatic hypertrophy)    Chronic diastolic heart failure ([place]) 12/27/2013   Chronic kidney disease    Hx of bilateral renal cysts.   Encounter for antineoplastic chemotherapy 02/20/2018   Glaucoma    Heart murmur    History of atrial fibrillation 03/09/09   Hx of epiglottitis    2011   Hypercholesterolemia    Hypertension    Long term current use of amiodarone 11/07/2013   Amiodarone    Mild aortic stenosis    OSA on CPAP 06/26/2014    Paroxysmal atrial fibrillation ([place]) 11/07/2013   Prostate cancer ([place]) 7/12   T1C Ad Ca  s/p TURP, observation only   Renal insufficiency 09/06/2011   Sinus node dysfunction ([place]) 08/14/2014   Sleep apnea    Smoldering multiple myeloma     Past Surgical History:  Procedure Laterality Date   ADRENALECTOMY  02/12/11   Left, for Pheochromocytoma   CARDIOVERSION N/A 12/26/2013   Procedure: CARDIOVERSION;  Surgeon: [name], MD;  Location: [facility];  Service: Cardiovascular;  Laterality: N/A;   CHOLECYSTECTOMY  02/12/11   GASTRECTOMY  1984   S/P gastrectomy for ulcer   [place]   S/P Right knee surgery   TEE WITHOUT CARDIOVERSION N/A 12/26/2013   Procedure: TRANSESOPHAGEAL ECHOCARDIOGRAM (TEE);  Surgeon: [name], MD;  Location: [facility];  Service: Cardiovascular;  Laterality: N/A;   TRANSURETHRAL RESECTION OF PROSTATE  7/12     The history is provided by the patient and a relative.  Head Injury Associated symptoms: no headaches, no nausea and no vomiting   Shortness of Breath Associated symptoms: no abdominal pain, no chest pain, no cough, no fever, no headaches, no rash and no vomiting        Home Medications Prior to Admission medications   Medication Sig Start Date End Date Taking? Authorizing Provider  acetaminophen (TYLENOL) 500  MG tablet Take 1,000 mg by mouth as needed.     [provider]  apixaban (ELIQUIS) 2.5 MG TABS tablet Take 1 tablet (2.5 mg total) by mouth 2 (two) times daily. 03/17/22   Belva Crome, MD  brimonidine-timolol (COMBIGAN) 0.2-0.5 % ophthalmic solution Place 1 drop into both eyes every 12 (twelve) hours.    [provider]  Cholecalciferol (VITAMIN D3) 50 MCG (2000 UT) capsule Take 1 capsule by mouth daily.     [provider]  diltiazem (CARDIZEM CD) 120 MG 24 hr capsule TAKE ONE CAPSULE BY MOUTH ONCE DAILY 03/21/22   Belva Crome, MD  dorzolamide-timolol (COSOPT) 22.3-6.8 MG/ML ophthalmic solution 1  drop 2 (two) times daily. 11/03/21   [provider]  hydrochlorothiazide (MICROZIDE) 12.5 MG capsule TAKE ONE CAPSULE BY MOUTH ONCE DAILY 03/21/22   Belva Crome, MD  latanoprost (XALATAN) 0.005 % ophthalmic solution INSTILL 1 DROP INTO BOTH EYES EVERY DAY AT NIGHT 01/03/18   [provider]  tamsulosin (FLOMAX) 0.4 MG CAPS capsule TAKE ONE CAPSULE BY MOUTH EVERY EVENING AFTER SUPPER 12/26/21   Tyler Pita, MD      Allergies    Benadryl [diphenhydramine hcl], Ceftriaxone sodium, Contrast media [iodinated contrast media], Nsaids, Penicillins, Codeine, Ibuprofen, Levaquin [levofloxacin], Oxybutynin, Tramadol hcl, Chlorhexidine, and Furosemide    Review of Systems   Review of Systems  Constitutional:  Positive for fatigue. Negative for chills and fever.  HENT:  Negative for facial swelling and trouble swallowing.   Eyes:  Negative for photophobia and visual disturbance.  Respiratory:  Positive for shortness of breath. Negative for cough.   Cardiovascular:  Negative for chest pain and palpitations.  Gastrointestinal:  Negative for abdominal pain, nausea and vomiting.  Endocrine: Negative for polydipsia and polyuria.  Genitourinary:  Negative for difficulty urinating and hematuria.  Musculoskeletal:  Negative for gait problem and joint swelling.  Skin:  Negative for pallor and rash.  Neurological:  Positive for syncope. Negative for headaches.  Psychiatric/Behavioral:  Negative for agitation and confusion.     Physical Exam Updated Vital Signs BP 138/68   Pulse 83   Temp 97.7 F (36.5 C) (Oral)   Resp 18   Ht 5' 4"  (1.626 m)   Wt 74.7 kg   SpO2 96%   BMI 28.25 kg/m  Physical Exam Vitals and nursing note reviewed.  Constitutional:      General: He is not in acute distress.    Appearance: Normal appearance. He is well-developed. He is not ill-appearing.  HENT:     Head: Normocephalic and atraumatic. No raccoon eyes, Battle's sign, right periorbital erythema or  left periorbital erythema.     Jaw: There is normal jaw occlusion. No trismus.     Right Ear: External ear normal.     Left Ear: External ear normal.     Mouth/Throat:     Mouth: Mucous membranes are moist.  Eyes:     General: No scleral icterus.    Extraocular Movements: Extraocular movements intact.     Pupils: Pupils are equal, round, and reactive to light.  Cardiovascular:     Rate and Rhythm: Normal rate and regular rhythm.     Pulses: Normal pulses.     Heart sounds: Normal heart sounds.  Pulmonary:     Effort: Pulmonary effort is normal. No accessory muscle usage or respiratory distress.     Breath sounds: Normal breath sounds. No decreased breath sounds.  Abdominal:     General: Abdomen  is flat.     Palpations: Abdomen is soft.     Tenderness: There is no abdominal tenderness. There is no guarding or rebound.  Musculoskeletal:        General: Normal range of motion.     Cervical back: Normal range of motion.     Right lower leg: No edema.     Left lower leg: No edema.  Skin:    General: Skin is warm and dry.     Capillary Refill: Capillary refill takes less than 2 seconds.  Neurological:     Mental Status: He is alert and oriented to person, place, and time.     GCS: GCS eye subscore is 4. GCS verbal subscore is 5. GCS motor subscore is 6.     Cranial Nerves: Cranial nerves 2-12 are intact. No dysarthria or facial asymmetry.     Sensory: Sensation is intact.     Motor: Motor function is intact. No tremor.     Coordination: Coordination is intact.  Psychiatric:        Mood and Affect: Mood normal.        Behavior: Behavior normal.     ED Results / Procedures / Treatments   Labs (all labs ordered are listed, but only abnormal results are displayed) Labs Reviewed  BASIC METABOLIC PANEL - Abnormal; Notable for the following components:      Result Value   Sodium 131 (*)    Glucose, Bld 133 (*)    Creatinine, Ser 1.80 (*)    GFR, Estimated 36 (*)    All other  components within normal limits  CBC - Abnormal; Notable for the following components:   Platelets 142 (*)    All other components within normal limits  BRAIN NATRIURETIC PEPTIDE - Abnormal; Notable for the following components:   B Natriuretic Peptide 229.4 (*)    All other components within normal limits  RESP PANEL BY RT-PCR (FLU A&B, COVID) ARPGX2  URINALYSIS, ROUTINE W REFLEX MICROSCOPIC  BLOOD GAS, VENOUS  TROPONIN I (HIGH SENSITIVITY)  TROPONIN I (HIGH SENSITIVITY)    EKG EKG Interpretation  Date/Time:  Wednesday May 25 2022 13:47:25 EDT Ventricular Rate:  91 PR Interval:    QRS Duration: 94 QT Interval:  343 QTC Calculation: 422 R Axis:   112 Text Interpretation: Atrial fibrillation Ventricular premature complex Right axis deviation Borderline repolarization abnormality Interpretation limited secondary to artifact Confirmed by Wynona Dove ([id number]) on 05/25/2022 3:12:40 PM  Radiology CT Head Wo Contrast  Result Date: 05/25/2022 CLINICAL DATA:  Provided history: Head trauma, minor. Neck trauma. Additional history provided: Patient reports dizzy spell last week (hitting head). Patient on blood thinners. EXAM: CT HEAD WITHOUT CONTRAST CT CERVICAL SPINE WITHOUT CONTRAST TECHNIQUE: Multidetector CT imaging of the head and cervical spine was performed following the standard protocol without intravenous contrast. Multiplanar CT image reconstructions of the cervical spine were also generated. RADIATION DOSE REDUCTION: This exam was performed according to the departmental dose-optimization program which includes automated exposure control, adjustment of the mA and/or kV according to patient size and/or use of iterative reconstruction technique. COMPARISON:  Brain MRI 11/06/2020. FINDINGS: CT HEAD FINDINGS Brain: Mild generalized parenchymal atrophy. Mild chronic small vessel ischemic changes within the cerebral white matter, better appreciated on the prior brain MRI of 11/06/2020. Small  chronic infarct within the right cerebellar hemisphere, also better appreciated on the prior MRI. There is no acute intracranial hemorrhage. No demarcated cortical infarct. No extra-axial fluid collection. No evidence of an intracranial  mass. No midline shift. Vascular: No hyperdense vessel.  Atherosclerotic calcifications. Skull: No fracture or aggressive osseous lesion. Sinuses/Orbits: No mass or acute finding within the imaged orbits. Mild mucosal thickening within the bilateral ethmoid, right sphenoid and bilateral maxillary sinuses at the imaged levels. CT CERVICAL SPINE FINDINGS Mildly motion degraded exam. Alignment: Reversal of the expected cervical lordosis. Slight C3-C4 grade 1 anterolisthesis. 3 mm C4-C5 grade 1 anterolisthesis. Skull base and vertebrae: The basion-dental and atlanto-dental intervals are maintained.No evidence of acute fracture to the cervical spine. Congenital nonunion of the posterior arch of C1. Bilateral C2-C3 facet joint ankylosis. Soft tissues and spinal canal: No prevertebral fluid or swelling. No visible canal hematoma. Disc levels: Cervical spondylosis with multilevel disc space narrowing, disc bulges, posterior disc osteophyte complexes, uncovertebral hypertrophy and facet arthrosis. Disc space narrowing is greatest at C5-C6, C6-C7 and C7-T1 (advanced at these levels). No appreciable high-grade spinal canal stenosis. Multilevel bony neural foraminal narrowing. Degenerative changes are also present at the C1-C2 articulation. Upper chest: No consolidation within the imaged lung apices. No visible pneumothorax. IMPRESSION: CT head: 1. No evidence of acute intracranial abnormality. 2. Mild chronic small vessel ischemic changes within the cerebral white matter. 3. Known small chronic right cerebellar infarct. 4. Mild paranasal sinus mucosal thickening at the imaged levels. CT cervical spine: 1. No evidence of acute fracture to the cervical spine. 2. Nonspecific reversal of the  expected cervical lordosis. 3. Mild grade 1 anterolisthesis at C3-C4 and C4-C5. 4. Cervical spondylosis, as described. 5. Bilateral C2-C3 facet joint ankylosis. Electronically Signed   By: Kellie Simmering D.O.   On: 05/25/2022 14:52   CT Cervical Spine Wo Contrast  Result Date: 05/25/2022 CLINICAL DATA:  Provided history: Head trauma, minor. Neck trauma. Additional history provided: Patient reports dizzy spell last week (hitting head). Patient on blood thinners. EXAM: CT HEAD WITHOUT CONTRAST CT CERVICAL SPINE WITHOUT CONTRAST TECHNIQUE: Multidetector CT imaging of the head and cervical spine was performed following the standard protocol without intravenous contrast. Multiplanar CT image reconstructions of the cervical spine were also generated. RADIATION DOSE REDUCTION: This exam was performed according to the departmental dose-optimization program which includes automated exposure control, adjustment of the mA and/or kV according to patient size and/or use of iterative reconstruction technique. COMPARISON:  Brain MRI 11/06/2020. FINDINGS: CT HEAD FINDINGS Brain: Mild generalized parenchymal atrophy. Mild chronic small vessel ischemic changes within the cerebral white matter, better appreciated on the prior brain MRI of 11/06/2020. Small chronic infarct within the right cerebellar hemisphere, also better appreciated on the prior MRI. There is no acute intracranial hemorrhage. No demarcated cortical infarct. No extra-axial fluid collection. No evidence of an intracranial mass. No midline shift. Vascular: No hyperdense vessel.  Atherosclerotic calcifications. Skull: No fracture or aggressive osseous lesion. Sinuses/Orbits: No mass or acute finding within the imaged orbits. Mild mucosal thickening within the bilateral ethmoid, right sphenoid and bilateral maxillary sinuses at the imaged levels. CT CERVICAL SPINE FINDINGS Mildly motion degraded exam. Alignment: Reversal of the expected cervical lordosis. Slight C3-C4  grade 1 anterolisthesis. 3 mm C4-C5 grade 1 anterolisthesis. Skull base and vertebrae: The basion-dental and atlanto-dental intervals are maintained.No evidence of acute fracture to the cervical spine. Congenital nonunion of the posterior arch of C1. Bilateral C2-C3 facet joint ankylosis. Soft tissues and spinal canal: No prevertebral fluid or swelling. No visible canal hematoma. Disc levels: Cervical spondylosis with multilevel disc space narrowing, disc bulges, posterior disc osteophyte complexes, uncovertebral hypertrophy and facet arthrosis. Disc space narrowing is greatest at  C5-C6, C6-C7 and C7-T1 (advanced at these levels). No appreciable high-grade spinal canal stenosis. Multilevel bony neural foraminal narrowing. Degenerative changes are also present at the C1-C2 articulation. Upper chest: No consolidation within the imaged lung apices. No visible pneumothorax. IMPRESSION: CT head: 1. No evidence of acute intracranial abnormality. 2. Mild chronic small vessel ischemic changes within the cerebral white matter. 3. Known small chronic right cerebellar infarct. 4. Mild paranasal sinus mucosal thickening at the imaged levels. CT cervical spine: 1. No evidence of acute fracture to the cervical spine. 2. Nonspecific reversal of the expected cervical lordosis. 3. Mild grade 1 anterolisthesis at C3-C4 and C4-C5. 4. Cervical spondylosis, as described. 5. Bilateral C2-C3 facet joint ankylosis. Electronically Signed   By: Kellie Simmering D.O.   On: 05/25/2022 14:52   DG Chest Portable 1 View  Result Date: 05/25/2022 CLINICAL DATA:  dib; intermittent shortness of breath.  Dizziness. EXAM: PORTABLE CHEST 1 VIEW COMPARISON:  None Available. FINDINGS: Stable borderline cardiomegaly and moderately severe ectatic thoracic aorta. Low lung volumes. Both lungs are clear and stable. Moderate thoracic spondylosis. IMPRESSION: Borderline cardiomegaly and moderately severe ectatic thoracic aorta, stable. Lungs remain clear.  Electronically Signed   By: Frazier Richards M.D.   On: 05/25/2022 14:35    Procedures Procedures    Medications Ordered in ED Medications  sodium chloride 0.9 % bolus 500 mL (500 mLs Intravenous New Bag/Given 05/25/22 1522)    ED Course/ Medical Decision Making/ A&P Clinical Course as of 05/25/22 1548  Wed May 25, 2022  1513 Well's score is LOW, he is on eliquis last dose was this AM [SG]    Clinical Course User Index [SG] Jeanell Sparrow, DO                           Medical Decision Making Amount and/or Complexity of Data Reviewed Labs: ordered. Radiology: ordered.   This patient presents to the ED with chief complaint(s) of fall w/ head injury, dib with pertinent past medical history of afib on doac,  which further complicates the presenting complaint. The complaint involves an extensive differential diagnosis and also carries with it a high risk of complications and morbidity.    Differential diagnoses for head trauma includes subdural hematoma, epidural hematoma, acute concussion, traumatic subarachnoid hemorrhage, cerebral contusions, etc.  In my evaluation of this patient's dyspnea my DDx includes, but is not limited to, pneumonia, pulmonary embolism, pneumothorax, pulmonary edema, metabolic acidosis, asthma, COPD, cardiac cause, anemia, anxiety, etc.   Serious etiologies were considered.   The initial plan is to labs/imaging   Additional history obtained: Additional history obtained from family Records reviewed Primary Care Documents , oncology notes (dr Julien Nordmann for IgG kappa smoldering MM, hx pheo; on velcade, revlimid, decadron, also s/p radiation therapy for his prostate cancer Dr Tammi Klippel))  Independent labs interpretation:  The following labs were independently interpreted:  BNP 229, Na 131, Cr 1.8 (baseline around 1.6)  Independent visualization of imaging: - I independently visualized the following imaging with scope of interpretation limited to determining  acute life threatening conditions related to emergency care: [facility] CT c-spine CXR, which revealed ct with chronic changes, chronic infarct, borderline cardiomegaly  Cardiac monitoring was reviewed and interpreted by myself which shows NSR  Treatment and Reassessment: Give 500 mL IVF  Consultation: - Consulted or discussed management/test interpretation w/ external professional: n/a   Consideration for admission or further workup: Admission was considered    Pt with fall  w/ head injury and brief LOC, worsening DIB from his baseline. He is Well's score of LOW, compliant with his eliquis; not tachycardic nor does he have hx of PE/DVT, he has hx of MM, initial troponin is negative. No dyspnea at rest. Initial labs are re-assuring, will check ambulatory pulse ox. If remainder of pending labs are unremarkable and pt still has dyspnea would be reasonable to explore option of a PE; will defer further management to incoming EDP at this time. Pt is HDS.     Social Determinants of health: Social History   Tobacco Use   Smoking status: Never   Smokeless tobacco: Never  Vaping Use   Vaping Use: Never used  Substance Use Topics   Alcohol use: No   Drug use: No            Final Clinical Impression(s) / ED Diagnoses Final diagnoses:  Minor head injury, initial encounter  Dyspnea on exertion    Rx / DC Orders ED Discharge Orders     None         Jeanell Sparrow, DO 05/25/22 1548

## 2022-05-25 NOTE — ED Provider Notes (Signed)
  Physical Exam  BP 122/61   Pulse (!) 114   Temp (!) 102.7 F (39.3 C) (Oral)   Resp 19   Ht $R'5\' 4"'MQ$  (1.626 m)   Wt 74.7 kg   SpO2 92%   BMI 28.25 kg/m   Physical Exam  Procedures  Procedures  ED Course / MDM   Clinical Course as of 05/25/22 1943  Wed May 25, 2022  1513 Well's score is LOW, he is on eliquis last dose was this AM [SG]    Clinical Course User Index [SG] Jeanell Sparrow, DO   Medical Decision Making Amount and/or Complexity of Data Reviewed Labs: ordered. Radiology: ordered.  Risk OTC drugs. Prescription drug management.   87yo male with history of paroxysmal atrial fibrillation on eliquis, chronic diastolic heart failure, htn, CKD, aortic stenosis, metastatic prostate cancer, indolent multiple myeloma who presented with concern for fall last week with continuing headache, fatigue and chills beginning yesterday.  On my history reports chronic dyspnea that he feels is unchanged but that fatigue for the last 2 days is new.    Reviewed and discussed imaging and lab findings with patient and daughter.  COVID 19 positive.  Mild hyponatremia, mildly increased Cr, likely in setting of decreased eating/drinking in setting of having COVID.    Ambulated in the room without significant desaturation, although noted to have tachycardia.  Called hospitalist who recommended rate control, did not feel significant interventions would be provided in hospital.   Discussed with patient and daughter--he reports his dyspnea is at baseline and his preference is to go home.  Did give small dose metoprolol 2.$RemoveBeforeDEI'5mg'[id number]$ , in addition to the IV fluids he had received-- had improved rate control.  Resting, he does not have significant tachycardia.  Sent message to his primary cardiologist to discuss possible increased rate control.  Patient discharged in stable condition with understanding of reasons to return.      Gareth Morgan, MD 05/26/22 [phone]

## 2022-05-25 NOTE — ED Triage Notes (Signed)
Patient states last week he had a dizzy spell and hit his head - Patient is on blood thinners.  No change in neuro deficits per family but they urged him to come get seen.   Patient states he has been intermittently short of breath x several weeks. Speaking in full sentences in triage. Saturations 97% on room air.

## 2022-06-13 DIAGNOSIS — I480 Paroxysmal atrial fibrillation: Secondary | ICD-10-CM | POA: Diagnosis not present

## 2022-06-13 DIAGNOSIS — I129 Hypertensive chronic kidney disease with stage 1 through stage 4 chronic kidney disease, or unspecified chronic kidney disease: Secondary | ICD-10-CM | POA: Diagnosis not present

## 2022-06-13 DIAGNOSIS — U099 Post covid-19 condition, unspecified: Secondary | ICD-10-CM | POA: Diagnosis not present

## 2022-06-15 ENCOUNTER — Telehealth: Payer: Self-pay | Admitting: Interventional Cardiology

## 2022-06-15 NOTE — Telephone Encounter (Signed)
Pt c/o medication issue:  1. Name of Medication: diltiazem (CARDIZEM CD) 120 MG 24 hr capsule  2. How are you currently taking this medication (dosage and times per day)? TAKE ONE CAPSULE BY MOUTH ONCE DAILY  3. Are you having a reaction (difficulty breathing--STAT)? no  4. What is your medication issue? PCP advised patient to call us about this medication.  Patient states he has had dizzy spells for some time.  He has had some falls and near falls.  He stopped taking the diltiazem about 6 days ago, since then patient states the dizzy spells have stop.  The PCP was not happy with him stopping the medication without talking to [place] first, PCP advised him to call us to discuss possibly lowering the dosage.

## 2022-06-15 NOTE — Telephone Encounter (Signed)
Spoke with pt and has had dizziness for long period of time and this usually occurs every day comes and goes  HR today 76 ,88 ,and 70  Per pt since stopping dizziness has stopped .Pt was taking Diltiazem in the am Will forward to Dr Tamala Julian for review .Adonis Housekeeper

## 2022-06-15 NOTE — Telephone Encounter (Signed)
Pt aware of recommendations and agrees with plan ./cy 

## 2022-06-17 DIAGNOSIS — H04123 Dry eye syndrome of bilateral lacrimal glands: Secondary | ICD-10-CM | POA: Diagnosis not present

## 2022-06-17 DIAGNOSIS — H35372 Puckering of macula, left eye: Secondary | ICD-10-CM | POA: Diagnosis not present

## 2022-06-17 DIAGNOSIS — I10 Essential (primary) hypertension: Secondary | ICD-10-CM | POA: Diagnosis not present

## 2022-06-17 DIAGNOSIS — H401132 Primary open-angle glaucoma, bilateral, moderate stage: Secondary | ICD-10-CM | POA: Diagnosis not present

## 2022-06-23 ENCOUNTER — Other Ambulatory Visit: Payer: Self-pay | Admitting: Radiation Oncology

## 2022-07-04 ENCOUNTER — Ambulatory Visit (HOSPITAL_COMMUNITY): Payer: Medicare Other | Attending: Interventional Cardiology

## 2022-07-04 DIAGNOSIS — I4819 Other persistent atrial fibrillation: Secondary | ICD-10-CM | POA: Diagnosis not present

## 2022-07-04 DIAGNOSIS — I5032 Chronic diastolic (congestive) heart failure: Secondary | ICD-10-CM | POA: Insufficient documentation

## 2022-07-04 LAB — ECHOCARDIOGRAM COMPLETE
AR max vel: 1.07 cm2
AV Area VTI: 1.03 cm2
AV Area mean vel: 1.15 cm2
AV Mean grad: 18.4 mmHg
AV Peak grad: 35.8 mmHg
Ao pk vel: 2.99 m/s
Area-P 1/2: 3.94 cm2
P 1/2 time: 369 msec
S' Lateral: 2.8 cm

## 2022-07-08 DIAGNOSIS — H35372 Puckering of macula, left eye: Secondary | ICD-10-CM | POA: Diagnosis not present

## 2022-07-08 DIAGNOSIS — H04123 Dry eye syndrome of bilateral lacrimal glands: Secondary | ICD-10-CM | POA: Diagnosis not present

## 2022-07-08 DIAGNOSIS — H401132 Primary open-angle glaucoma, bilateral, moderate stage: Secondary | ICD-10-CM | POA: Diagnosis not present

## 2022-07-08 DIAGNOSIS — H26491 Other secondary cataract, right eye: Secondary | ICD-10-CM | POA: Diagnosis not present

## 2022-07-10 NOTE — Progress Notes (Addendum)
Cardiology Office Note:    Date:  07/11/2022   ID:  Thomas [name], DOB 1934/10/05, MRN [id number]  PCP:  [name], MD  Cardiologist:  [name], MD   Referring MD: [name], MD   Chief Complaint  Patient presents with   Atrial Fibrillation   Congestive Heart Failure   Cardiac Valve Problem    Aortic stenosis    History of Present Illness:    Thomas [name] is a 87 y.o. male with a hx of permanent atrial fibrillation, chronic diastolic heart failure, hypertension, chronic kidney disease, unable to use anticoagulation therapy due to recurrent GU bleeding, and moderately severe aortic stenosis.  Other medical issues include metastatic prostate CA (Lupron) and indolent multiple myeloma.   He is having progressive shortness of breath and fatigue with exertion.  He occasionally has orthopnea.  He has metastatic prostate cancer and indolent multiple myeloma.  He is still independently mobile but developing progressive weakness and difficulty with ambulation.  Past Medical History:  Diagnosis Date   BPH (benign prostatic hypertrophy)    Chronic diastolic heart failure ([place]) 12/27/2013   Chronic kidney disease    Hx of bilateral renal cysts.   Encounter for antineoplastic chemotherapy 02/20/2018   Glaucoma    Heart murmur    History of atrial fibrillation 03/09/09   Hx of epiglottitis    2011   Hypercholesterolemia    Hypertension    Long term current use of amiodarone 11/07/2013   Amiodarone    Mild aortic stenosis    OSA on CPAP 06/26/2014   Paroxysmal atrial fibrillation ([place]) 11/07/2013   Prostate cancer ([place]) 7/12   T1C Ad Ca  s/p TURP, observation only   Renal insufficiency 09/06/2011   Sinus node dysfunction ([place]) 08/14/2014   Sleep apnea    Smoldering multiple myeloma     Past Surgical History:  Procedure Laterality Date   ADRENALECTOMY  02/12/11   Left, for Pheochromocytoma   CARDIOVERSION N/A 12/26/2013   Procedure: CARDIOVERSION;  Surgeon:  [name], MD;  Location: [facility];  Service: Cardiovascular;  Laterality: N/A;   CHOLECYSTECTOMY  02/12/11   GASTRECTOMY  1984   S/P gastrectomy for ulcer   [place]   S/P Right knee surgery   TEE WITHOUT CARDIOVERSION N/A 12/26/2013   Procedure: TRANSESOPHAGEAL ECHOCARDIOGRAM (TEE);  Surgeon: [name], MD;  Location: [facility];  Service: Cardiovascular;  Laterality: N/A;   TRANSURETHRAL RESECTION OF PROSTATE  7/12    Current Medications: Current Meds  Medication Sig   acetaminophen (TYLENOL) 500 MG tablet Take 1,000 mg by mouth as needed.    apixaban (ELIQUIS) 2.5 MG TABS tablet Take 1 tablet (2.5 mg total) by mouth 2 (two) times daily.   brimonidine-timolol (COMBIGAN) 0.2-0.5 % ophthalmic solution Place 1 drop into both eyes every 12 (twelve) hours.   Cholecalciferol (VITAMIN D3) 50 MCG (2000 UT) capsule Take 1 capsule by mouth daily.    diltiazem (CARDIZEM CD) 120 MG 24 hr capsule TAKE ONE CAPSULE BY MOUTH ONCE DAILY   dorzolamide-timolol (COSOPT) 22.3-6.8 MG/ML ophthalmic solution 1 drop 2 (two) times daily.   hydrochlorothiazide (MICROZIDE) 12.5 MG capsule TAKE ONE CAPSULE BY MOUTH ONCE DAILY   latanoprost (XALATAN) 0.005 % ophthalmic solution INSTILL 1 DROP INTO BOTH EYES EVERY DAY AT NIGHT   tamsulosin (FLOMAX) 0.4 MG CAPS capsule TAKE ONE CAPSULE BY MOUTH EVERY EVENING AFTER SUPPER     Allergies:   Benadryl [diphenhydramine hcl], Ceftriaxone sodium,  Contrast media [iodinated contrast media], Nsaids, Penicillins, Codeine, Ibuprofen, Levaquin [levofloxacin], Oxybutynin, Tramadol hcl, Chlorhexidine, and Furosemide   Social History   Socioeconomic History   Marital status: Married    Spouse name: Not on file   Number of children: 3   Years of education: Not on file   Highest education level: Not on file  Occupational History    Employer: RETIRED  Tobacco Use   Smoking status: Never   Smokeless tobacco: Never  Vaping Use   Vaping Use: Never used   Substance and Sexual Activity   Alcohol use: No   Drug use: No   Sexual activity: Not Currently  Other Topics Concern   Not on file  Social History Narrative   Not on file   Social Determinants of Health   Financial Resource Strain: Not on file  Food Insecurity: Not on file  Transportation Needs: Not on file  Physical Activity: Not on file  Stress: Not on file  Social Connections: Not on file     Family History: The patient's family history includes Kidney Stones in his brother; Liver disease in his sister, sister, and sister; Melanoma in his brother; Polymyositis in his mother; Prostate cancer in his brother and father.  ROS:   Please see the history of present illness.    He has not had syncope.  No blood in the urine or stool.  All other systems reviewed and are negative.  EKGs/Labs/Other Studies Reviewed:    The following studies were reviewed today: 07/2022 ECHOCARDIOGRAM: IMPRESSIONS     1. Left ventricular ejection fraction, by estimation, is 60 to 65%. The  left ventricle has normal function. The left ventricle has no regional  wall motion abnormalities. There is moderate asymmetric left ventricular  hypertrophy of the basal and septal  segments. Left ventricular diastolic parameters were normal.   2. Right ventricular systolic function is normal. The right ventricular  size is normal.   3. Left atrial size was moderately dilated.   4. Nodular calcificatio nof anterior leaflet . The mitral valve is  abnormal. Mild mitral valve regurgitation. No evidence of mitral stenosis.   5. Gradients increased since prior echo mean 17.8-> 18.4 peak 29.8->35.8  DVI similar 0.26-> 0.25 and AVA similar still > 1 cm2 partial fusion of  right and left cusps . The aortic valve is abnormal. There is severe  calcifcation of the aortic valve. There   is severe thickening of the aortic valve. Aortic valve regurgitation is  moderate. Moderate to severe aortic valve stenosis.   6.  Aortic dilatation noted. There is moderate dilatation of the aortic  root, measuring 42 mm. There is moderate dilatation of the ascending  aorta, measuring 41 mm.   7. The inferior vena cava is normal in size with greater than 50%  respiratory variability, suggesting right atrial pressure of 3 mmHg.   EKG:  EKG performed on May 25, 2022 demonstrated right axis deviation, A-fib with moderate rate control, and low voltage.  Recent Labs: 01/11/2022: ALT 10 05/25/2022: B Natriuretic Peptide 229.4; BUN 22; Creatinine, Ser 1.80; Hemoglobin 14.3; Platelets 142; Potassium 4.1; Sodium 135  Recent Lipid Panel    Component Value Date/Time   CHOL 164 02/25/2020 0842   TRIG 131 02/25/2020 0842   HDL 41 02/25/2020 0842   CHOLHDL 4.0 02/25/2020 0842   LDLCALC 100 (H) 02/25/2020 0842    Physical Exam:    VS:  BP 112/70   Pulse 84   Ht 5' 4"  (1.626  m)   Wt 151 lb 12.8 oz (68.9 kg)   SpO2 99%   BMI 26.06 kg/m     Wt Readings from Last 3 Encounters:  07/11/22 151 lb 12.8 oz (68.9 kg)  05/25/22 164 lb 9.6 oz (74.7 kg)  01/18/22 166 lb 12.8 oz (75.7 kg)     GEN: Elderly. No acute distress HEENT: Normal NECK: No JVD. LYMPHATICS: No lymphadenopathy CARDIAC: 2/6 to 3/6 crescendo decrescendo systolic and 1/6 to 2/6 decrescendo diastolic aortic valve murmur. IIRR no gallop, or edema. VASCULAR:  normal Pulses. No bruits. RESPIRATORY:  Clear to auscultation without rales, wheezing or rhonchi  ABDOMEN: Soft, non-tender, non-distended, No pulsatile mass, MUSCULOSKELETAL: No deformity  SKIN: Warm and dry NEUROLOGIC:  Alert and oriented x 3 PSYCHIATRIC:  Normal affect   ASSESSMENT:    1. Moderate aortic stenosis   2. Persistent atrial fibrillation ([place])   3. Chronic diastolic heart failure ([place])   4. OSA on CPAP   5. Chronic anticoagulation   6. Multiple myeloma not having achieved remission (H[facility])   7. Stage 3b chronic kidney disease (H[facility])    PLAN:    In order of problems listed  above:  Probable severe aortic stenosis now based upon 2 echoes over the past year.  Symptoms are compatible with diastolic heart failure.  We had a discussion concerning management including heart catheterization with referral to the structural heart clinic.  The pros and cons of going this route will be tempered somewhat by the risk-benefit associated with his known multiple myeloma and metastatic prostate cancer.  We need information from both oncology and urology concerning his long-term prognosis.  He would need to have at least a reasonable opportunity for 3 to 5-year survival for TAVR to be of benefit.  He will speak with Dr. Julien Nordmann in October and Urologist in November. Start Lanoxin 0.125 mg Monday, Wednesday, and Friday for better rate control now that he is off diltiazem. Continue low-dose diuretic therapy.  May need to move to a loop diuretic.   He is compliant with CPAP. Continue Eliquis. We need some idea of long-term prognosis for multiple myeloma and his metastatic prostate cancer. Kidney function is stable.   Natural history of aortic valve stenosis was discussed in detail.  Cardinal symptoms of angina, syncope, and dyspnea were reviewed and significance relative to prognosis was described.  The importance of sequential imaging for disease monitoring was emphasized.  Work-up including possible heart catheterization and CT angiography were described as essential components of staging for therapy.  Treatment options, TAVR and SAVR, were discussed in some detail with emphasis on TAVR.  Risk versus benefit conversation with the patient and his daughter today.  We need additional information from oncology and urology  Medication Adjustments/Labs and Tests Ordered: Current medicines are reviewed at length with the patient today.  Concerns regarding medicines are outlined above.  No orders of the defined types were placed in this encounter.  No orders of the defined types were placed in  this encounter.   There are no Patient Instructions on file for this visit.   Signed, [name], MD  07/11/2022 10:40 AM    [place]

## 2022-07-11 ENCOUNTER — Encounter: Payer: Self-pay | Admitting: Interventional Cardiology

## 2022-07-11 ENCOUNTER — Ambulatory Visit: Payer: Medicare Other | Attending: Interventional Cardiology | Admitting: Interventional Cardiology

## 2022-07-11 ENCOUNTER — Telehealth: Payer: Self-pay | Admitting: Interventional Cardiology

## 2022-07-11 VITALS — BP 112/70 | HR 84 | Ht 64.0 in | Wt 151.8 lb

## 2022-07-11 DIAGNOSIS — I350 Nonrheumatic aortic (valve) stenosis: Secondary | ICD-10-CM

## 2022-07-11 DIAGNOSIS — N1832 Chronic kidney disease, stage 3b: Secondary | ICD-10-CM | POA: Diagnosis not present

## 2022-07-11 DIAGNOSIS — I4819 Other persistent atrial fibrillation: Secondary | ICD-10-CM | POA: Diagnosis not present

## 2022-07-11 DIAGNOSIS — I5032 Chronic diastolic (congestive) heart failure: Secondary | ICD-10-CM | POA: Diagnosis not present

## 2022-07-11 DIAGNOSIS — G4733 Obstructive sleep apnea (adult) (pediatric): Secondary | ICD-10-CM | POA: Diagnosis not present

## 2022-07-11 DIAGNOSIS — C9000 Multiple myeloma not having achieved remission: Secondary | ICD-10-CM | POA: Diagnosis not present

## 2022-07-11 DIAGNOSIS — Z7901 Long term (current) use of anticoagulants: Secondary | ICD-10-CM | POA: Diagnosis not present

## 2022-07-11 MED ORDER — DIGOXIN 125 MCG PO TABS: 0.1250 mg | ORAL_TABLET | ORAL | 3 refills | Status: DC

## 2022-07-11 NOTE — Telephone Encounter (Signed)
Returned call and spoke with patient.  Patient requests a copy of his echocardiogram be mailed to him. Verified mailing address on chart.   A copy will be placed in mail for patient per his request. Patient verbalized understanding and expressed appreciation for call.

## 2022-07-11 NOTE — Telephone Encounter (Signed)
Pt calling because he would like to come back by to get a copy of his echocardiogram results because he states he can't see it in [place], he can only see numbers

## 2022-07-11 NOTE — Patient Instructions (Addendum)
Medication Instructions:  Your physician has recommended you make the following change in your medication:  1) START digoxin (Lanoxin) 0.'125mg'$  on Mondays, Wednesdays, and Fridays.  *If you need a refill on your cardiac medications before your next appointment, please call your pharmacy*  Lab Work: NONE  Testing/Procedures: NONE  Follow-Up: At [facility], you and your health needs are our priority.  As part of our continuing mission to provide you with exceptional heart care, we have created designated Provider Care Teams.  These Care Teams include your primary Cardiologist (physician) and Advanced Practice Providers (APPs -  Physician Assistants and Nurse Practitioners) who all work together to provide you with the care you need, when you need it.  Your next appointment:   6 month(s)  The format for your next appointment:   In Person  Provider:   [name], MD  Other Instructions Please call our office at [phone] or send message via MyChart to let us know your decision on moving forward with aortic valve replacement (TAVR) after discussing with Dr. Julien Nordmann.   Important Information About [place]

## 2022-07-13 ENCOUNTER — Inpatient Hospital Stay: Payer: Medicare Other | Attending: Internal Medicine

## 2022-07-13 DIAGNOSIS — I480 Paroxysmal atrial fibrillation: Secondary | ICD-10-CM | POA: Diagnosis not present

## 2022-07-13 DIAGNOSIS — Z9049 Acquired absence of other specified parts of digestive tract: Secondary | ICD-10-CM | POA: Insufficient documentation

## 2022-07-13 DIAGNOSIS — Z79899 Other long term (current) drug therapy: Secondary | ICD-10-CM | POA: Insufficient documentation

## 2022-07-13 DIAGNOSIS — Z9079 Acquired absence of other genital organ(s): Secondary | ICD-10-CM | POA: Diagnosis not present

## 2022-07-13 DIAGNOSIS — Z880 Allergy status to penicillin: Secondary | ICD-10-CM | POA: Diagnosis not present

## 2022-07-13 DIAGNOSIS — N1832 Chronic kidney disease, stage 3b: Secondary | ICD-10-CM | POA: Insufficient documentation

## 2022-07-13 DIAGNOSIS — Z885 Allergy status to narcotic agent status: Secondary | ICD-10-CM | POA: Insufficient documentation

## 2022-07-13 DIAGNOSIS — G4733 Obstructive sleep apnea (adult) (pediatric): Secondary | ICD-10-CM | POA: Diagnosis not present

## 2022-07-13 DIAGNOSIS — R5383 Other fatigue: Secondary | ICD-10-CM | POA: Insufficient documentation

## 2022-07-13 DIAGNOSIS — Z7901 Long term (current) use of anticoagulants: Secondary | ICD-10-CM | POA: Diagnosis not present

## 2022-07-13 DIAGNOSIS — Z886 Allergy status to analgesic agent status: Secondary | ICD-10-CM | POA: Diagnosis not present

## 2022-07-13 DIAGNOSIS — Z7969 Long term (current) use of other immunomodulators and immunosuppressants: Secondary | ICD-10-CM | POA: Insufficient documentation

## 2022-07-13 DIAGNOSIS — Z7952 Long term (current) use of systemic steroids: Secondary | ICD-10-CM | POA: Insufficient documentation

## 2022-07-13 DIAGNOSIS — Z881 Allergy status to other antibiotic agents status: Secondary | ICD-10-CM | POA: Diagnosis not present

## 2022-07-13 DIAGNOSIS — C61 Malignant neoplasm of prostate: Secondary | ICD-10-CM | POA: Insufficient documentation

## 2022-07-13 DIAGNOSIS — M2550 Pain in unspecified joint: Secondary | ICD-10-CM | POA: Diagnosis not present

## 2022-07-13 DIAGNOSIS — I350 Nonrheumatic aortic (valve) stenosis: Secondary | ICD-10-CM | POA: Diagnosis not present

## 2022-07-13 DIAGNOSIS — Z888 Allergy status to other drugs, medicaments and biological substances status: Secondary | ICD-10-CM | POA: Diagnosis not present

## 2022-07-13 DIAGNOSIS — I5032 Chronic diastolic (congestive) heart failure: Secondary | ICD-10-CM | POA: Diagnosis not present

## 2022-07-13 DIAGNOSIS — C9000 Multiple myeloma not having achieved remission: Secondary | ICD-10-CM

## 2022-07-13 LAB — CBC WITH DIFFERENTIAL (CANCER CENTER ONLY)
Abs Immature Granulocytes: 0.04 10*3/uL (ref 0.00–0.07)
Basophils Absolute: 0 10*3/uL (ref 0.0–0.1)
Basophils Relative: 0 %
Eosinophils Absolute: 0.1 10*3/uL (ref 0.0–0.5)
Eosinophils Relative: 1 %
HCT: 42.3 % (ref 39.0–52.0)
Hemoglobin: 14.9 g/dL (ref 13.0–17.0)
Immature Granulocytes: 1 %
Lymphocytes Relative: 19 %
Lymphs Abs: 0.9 10*3/uL (ref 0.7–4.0)
MCH: 32.3 pg (ref 26.0–34.0)
MCHC: 35.2 g/dL (ref 30.0–36.0)
MCV: 91.6 fL (ref 80.0–100.0)
Monocytes Absolute: 0.6 10*3/uL (ref 0.1–1.0)
Monocytes Relative: 11 %
Neutro Abs: 3.4 10*3/uL (ref 1.7–7.7)
Neutrophils Relative %: 68 %
Platelet Count: 160 10*3/uL (ref 150–400)
RBC: 4.62 MIL/uL (ref 4.22–5.81)
RDW: 14.6 % (ref 11.5–15.5)
WBC Count: 5 10*3/uL (ref 4.0–10.5)
nRBC: 0 % (ref 0.0–0.2)

## 2022-07-13 LAB — CMP (CANCER CENTER ONLY)
ALT: 21 U/L (ref 0–44)
AST: 24 U/L (ref 15–41)
Albumin: 4.1 g/dL (ref 3.5–5.0)
Alkaline Phosphatase: 68 U/L (ref 38–126)
Anion gap: 6 (ref 5–15)
BUN: 25 mg/dL — ABNORMAL HIGH (ref 8–23)
CO2: 27 mmol/L (ref 22–32)
Calcium: 10.1 mg/dL (ref 8.9–10.3)
Chloride: 102 mmol/L (ref 98–111)
Creatinine: 1.48 mg/dL — ABNORMAL HIGH (ref 0.61–1.24)
GFR, Estimated: 46 mL/min — ABNORMAL LOW (ref >60)
Glucose, Bld: 118 mg/dL — ABNORMAL HIGH (ref 70–99)
Potassium: 3.7 mmol/L (ref 3.5–5.1)
Sodium: 135 mmol/L (ref 135–145)
Total Bilirubin: 1.4 mg/dL — ABNORMAL HIGH (ref 0.3–1.2)
Total Protein: 6.8 g/dL (ref 6.5–8.1)

## 2022-07-13 LAB — LACTATE DEHYDROGENASE: LDH: 154 U/L (ref 98–192)

## 2022-07-14 LAB — KAPPA/LAMBDA LIGHT CHAINS
Kappa free light chain: 106.5 mg/L — ABNORMAL HIGH (ref 3.3–19.4)
Kappa, lambda light chain ratio: 8.52 — ABNORMAL HIGH (ref 0.26–1.65)
Lambda free light chains: 12.5 mg/L (ref 5.7–26.3)

## 2022-07-14 LAB — IGG, IGA, IGM
IgA: 66 mg/dL (ref 61–437)
IgG (Immunoglobin G), Serum: 1172 mg/dL (ref 603–1613)
IgM (Immunoglobulin M), Srm: 42 mg/dL (ref 15–143)

## 2022-07-14 LAB — BETA 2 MICROGLOBULIN, SERUM: Beta-2 Microglobulin: 2.7 mg/L — ABNORMAL HIGH (ref 0.6–2.4)

## 2022-07-15 DIAGNOSIS — H26492 Other secondary cataract, left eye: Secondary | ICD-10-CM | POA: Diagnosis not present

## 2022-07-20 ENCOUNTER — Inpatient Hospital Stay: Payer: Medicare Other | Admitting: Internal Medicine

## 2022-07-20 VITALS — BP 134/79 | HR 82 | Temp 97.6°F | Resp 18 | Ht 64.0 in | Wt 151.1 lb

## 2022-07-20 DIAGNOSIS — M2550 Pain in unspecified joint: Secondary | ICD-10-CM | POA: Diagnosis not present

## 2022-07-20 DIAGNOSIS — Z886 Allergy status to analgesic agent status: Secondary | ICD-10-CM | POA: Diagnosis not present

## 2022-07-20 DIAGNOSIS — I5032 Chronic diastolic (congestive) heart failure: Secondary | ICD-10-CM | POA: Diagnosis not present

## 2022-07-20 DIAGNOSIS — C9000 Multiple myeloma not having achieved remission: Secondary | ICD-10-CM

## 2022-07-20 DIAGNOSIS — Z881 Allergy status to other antibiotic agents status: Secondary | ICD-10-CM | POA: Diagnosis not present

## 2022-07-20 DIAGNOSIS — I350 Nonrheumatic aortic (valve) stenosis: Secondary | ICD-10-CM | POA: Diagnosis not present

## 2022-07-20 DIAGNOSIS — Z9049 Acquired absence of other specified parts of digestive tract: Secondary | ICD-10-CM | POA: Diagnosis not present

## 2022-07-20 DIAGNOSIS — N1832 Chronic kidney disease, stage 3b: Secondary | ICD-10-CM | POA: Diagnosis not present

## 2022-07-20 DIAGNOSIS — Z885 Allergy status to narcotic agent status: Secondary | ICD-10-CM | POA: Diagnosis not present

## 2022-07-20 DIAGNOSIS — G4733 Obstructive sleep apnea (adult) (pediatric): Secondary | ICD-10-CM | POA: Diagnosis not present

## 2022-07-20 DIAGNOSIS — Z880 Allergy status to penicillin: Secondary | ICD-10-CM | POA: Diagnosis not present

## 2022-07-20 DIAGNOSIS — Z79899 Other long term (current) drug therapy: Secondary | ICD-10-CM | POA: Diagnosis not present

## 2022-07-20 DIAGNOSIS — Z888 Allergy status to other drugs, medicaments and biological substances status: Secondary | ICD-10-CM | POA: Diagnosis not present

## 2022-07-20 DIAGNOSIS — Z7969 Long term (current) use of other immunomodulators and immunosuppressants: Secondary | ICD-10-CM | POA: Diagnosis not present

## 2022-07-20 DIAGNOSIS — C61 Malignant neoplasm of prostate: Secondary | ICD-10-CM | POA: Diagnosis not present

## 2022-07-20 DIAGNOSIS — Z7952 Long term (current) use of systemic steroids: Secondary | ICD-10-CM | POA: Diagnosis not present

## 2022-07-20 DIAGNOSIS — R5383 Other fatigue: Secondary | ICD-10-CM | POA: Diagnosis not present

## 2022-07-20 DIAGNOSIS — I480 Paroxysmal atrial fibrillation: Secondary | ICD-10-CM | POA: Diagnosis not present

## 2022-07-20 DIAGNOSIS — Z7901 Long term (current) use of anticoagulants: Secondary | ICD-10-CM | POA: Diagnosis not present

## 2022-07-20 NOTE — Progress Notes (Signed)
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 2) History of pheochromocytoma LEFT adrenal gland status post resection on 03/16/2009 3) Prostate adenocarcinoma  PRIOR THERAPY:  1)Systemic therapy with weekly Velcade 1.3 mg/KG subcutaneously, Revlimid 25 mg p.o. daily for 21 days every 4 weeks as well as Decadron 20 mg p.o. weekly.  First dose November 07, 2017.  Status post 7 cycles. 2) definitive radiotherapy to the prostate cancer under the care of Dr. Tammi Klippel.  CURRENT THERAPY: Observation.  INTERVAL HISTORY: Thomas [name] 87 y.o. male returns to the clinic today for follow-up visit accompanied by his wife and daughter.  The patient is feeling fine today with no concerning complaints.  He was diagnosed with COVID-19 few months ago and treated with molnupiravir.  He took 1 dose at home and could not tolerate it and he has not been feeling well since that time.  He denied having any current chest pain, shortness of breath, cough or hemoptysis.  He has no nausea, vomiting, diarrhea or constipation.  He has no headache or visual changes.  He was also found by Dr. Daneen Schick his cardiologist to have severe aortic stenosis and the patient may need surgical intervention depending on his current situation with the multiple myeloma.  He is here today for evaluation with repeat myeloma panel.   MEDICAL HISTORY: Past Medical History:  Diagnosis Date   BPH (benign prostatic hypertrophy)    Chronic diastolic heart failure ([facility]) 12/27/2013   Chronic kidney disease    Hx of bilateral renal cysts.   Encounter for antineoplastic chemotherapy 02/20/2018   Glaucoma    Heart murmur    History of atrial fibrillation 03/09/09   Hx of epiglottitis     2011   Hypercholesterolemia    Hypertension    Long term current use of amiodarone 11/07/2013   Amiodarone    Mild aortic stenosis    OSA on CPAP 06/26/2014   Paroxysmal atrial fibrillation ([facility]) 11/07/2013   Prostate cancer ([facility]) 7/12   T1C Ad Ca  s/p TURP, observation only   Renal insufficiency 09/06/2011   Sinus node dysfunction ([facility]) 08/14/2014   Sleep apnea    Smoldering multiple myeloma     ALLERGIES:  is allergic to benadryl [diphenhydramine hcl], ceftriaxone sodium, contrast media [iodinated contrast media], nsaids, penicillins, codeine, ibuprofen, levaquin [levofloxacin], oxybutynin, tramadol hcl, chlorhexidine, and furosemide.  MEDICATIONS:  Current Outpatient Medications  Medication Sig Dispense Refill   acetaminophen (TYLENOL) 500 MG tablet Take 1,000 mg by mouth as needed.      apixaban (ELIQUIS) 2.5 MG TABS tablet Take 1 tablet (2.5 mg total) by mouth 2 (two) times daily. 180 tablet 1   Cholecalciferol (VITAMIN D3) 50 MCG (2000 UT) capsule Take 1 capsule by mouth daily.      digoxin (LANOXIN) 0.125 MG tablet Take 1 tablet (0.125 mg total) by mouth 3 (three) times a week. Take on Mondays, Wednesdays, and Fridays. 90 tablet 3   dorzolamide-timolol (COSOPT) 22.3-6.8 MG/ML ophthalmic solution 1 drop 2 (two) times daily.     hydrochlorothiazide (MICROZIDE) 12.5 MG capsule TAKE ONE CAPSULE BY MOUTH ONCE DAILY 90 capsule 1   latanoprost (XALATAN) 0.005 % ophthalmic solution INSTILL 1 DROP INTO BOTH EYES EVERY DAY AT NIGHT  6   tamsulosin (FLOMAX) 0.4 MG CAPS capsule TAKE ONE CAPSULE BY MOUTH EVERY EVENING AFTER SUPPER 30 capsule 5   No current facility-administered medications for this visit.    SURGICAL HISTORY:  Past Surgical History:  Procedure Laterality Date   ADRENALECTOMY  02/12/11   Left, for Pheochromocytoma   CARDIOVERSION N/A 12/26/2013   Procedure: CARDIOVERSION;  Surgeon: [name], MD;  Location: [facility];  Service: Cardiovascular;  Laterality: N/A;    CHOLECYSTECTOMY  02/12/11   GASTRECTOMY  1984   S/P gastrectomy for ulcer   KNEE SURGERY  1971   S/P Right knee surgery   TEE WITHOUT CARDIOVERSION N/A 12/26/2013   Procedure: TRANSESOPHAGEAL ECHOCARDIOGRAM (TEE);  Surgeon: [name], MD;  Location: [facility];  Service: Cardiovascular;  Laterality: N/A;   TRANSURETHRAL RESECTION OF PROSTATE  7/12    REVIEW OF SYSTEMS:  A comprehensive review of systems was negative except for: Constitutional: positive for fatigue Musculoskeletal: positive for arthralgias   PHYSICAL EXAMINATION: General appearance: alert, cooperative and no distress Head: Normocephalic, without obvious abnormality, atraumatic Neck: no adenopathy, no JVD, supple, symmetrical, trachea midline and thyroid not enlarged, symmetric, no tenderness/mass/nodules Lymph nodes: Cervical, supraclavicular, and axillary nodes normal. Resp: clear to auscultation bilaterally Back: symmetric, no curvature. ROM normal. No CVA tenderness. Cardio: regular rate and rhythm, S1, S2 normal, no murmur, click, rub or gallop GI: soft, non-tender; bowel sounds normal; no masses,  no organomegaly Extremities: extremities normal, atraumatic, no cyanosis or edema  ECOG PERFORMANCE STATUS: 1 - Symptomatic but completely ambulatory  Blood pressure 134/79, pulse 82, temperature 97.6 F (36.4 C), temperature source Oral, resp. rate 18, height 5' 4"  (1.626 m), weight 151 lb 1 oz (68.5 kg), SpO2 99 %.  LABORATORY DATA: Lab Results  Component Value Date   WBC 5.0 07/13/2022   HGB 14.9 07/13/2022   HCT 42.3 07/13/2022   MCV 91.6 07/13/2022   PLT 160 07/13/2022      Chemistry      Component Value Date/Time   NA 135 07/13/2022 1350   NA 139 06/04/2021 1041   NA 138 08/16/2017 1404   K 3.7 07/13/2022 1350   K 4.5 08/16/2017 1404   CL 102 07/13/2022 1350   CL 109 (H) 03/21/2013 0958   CO2 27 07/13/2022 1350   CO2 24 08/16/2017 1404   BUN 25 (H) 07/13/2022 1350   BUN 21 06/04/2021 1041    BUN 17.3 08/16/2017 1404   CREATININE 1.48 (H) 07/13/2022 1350   CREATININE 1.8 (H) 08/16/2017 1404      Component Value Date/Time   CALCIUM 10.1 07/13/2022 1350   CALCIUM 9.9 08/16/2017 1404   ALKPHOS 68 07/13/2022 1350   ALKPHOS 86 08/16/2017 1404   AST 24 07/13/2022 1350   AST 22 08/16/2017 1404   ALT 21 07/13/2022 1350   ALT 16 08/16/2017 1404   BILITOT 1.4 (H) 07/13/2022 1350   BILITOT 0.78 08/16/2017 1404       RADIOGRAPHIC STUDIES: ECHOCARDIOGRAM COMPLETE  Result Date: 07/04/2022    ECHOCARDIOGRAM REPORT   Patient Name:   Thomas [name] Date of Exam: 07/04/2022 Medical Rec #:  [id number]         Height:       64.0 in Accession #:    [id number]        Weight:       164.6 lb Date of Birth:  Jan 19, 1935         BSA:  1.801 m Patient Age:    66 years          BP:           98/60 mmHg Patient Gender: M                 HR:           76 bpm. Exam Location:  [address] Procedure: 2D Echo, Cardiac Doppler and Color Doppler Indications:    I48.19 Atrial fibrillation  History:        Patient has prior history of Echocardiogram examinations, most                 recent 05/28/2021. CHF, Dilated aortic root, AS,                 Arrythmias:Atrial Fibrillation; Risk Factors:Hypertension and                 Dyslipidemia.  Sonographer:    Coralyn Helling RDCS Referring Phys: [place]  1. Left ventricular ejection fraction, by estimation, is 60 to 65%. The left ventricle has normal function. The left ventricle has no regional wall motion abnormalities. There is moderate asymmetric left ventricular hypertrophy of the basal and septal segments. Left ventricular diastolic parameters were normal.  2. Right ventricular systolic function is normal. The right ventricular size is normal.  3. Left atrial size was moderately dilated.  4. Nodular calcificatio nof anterior leaflet . The mitral valve is abnormal. Mild mitral valve regurgitation. No evidence of mitral stenosis.  5.  Gradients increased since prior echo mean 17.8-> 18.4 peak 29.8->35.8 DVI similar 0.26-> 0.25 and AVA similar still > 1 cm2 partial fusion of right and left cusps . The aortic valve is abnormal. There is severe calcifcation of the aortic valve. There  is severe thickening of the aortic valve. Aortic valve regurgitation is moderate. Moderate to severe aortic valve stenosis.  6. Aortic dilatation noted. There is moderate dilatation of the aortic root, measuring 42 mm. There is moderate dilatation of the ascending aorta, measuring 41 mm.  7. The inferior vena cava is normal in size with greater than 50% respiratory variability, suggesting right atrial pressure of 3 mmHg. FINDINGS  Left Ventricle: Left ventricular ejection fraction, by estimation, is 60 to 65%. The left ventricle has normal function. The left ventricle has no regional wall motion abnormalities. The left ventricular internal cavity size was normal in size. There is  moderate asymmetric left ventricular hypertrophy of the basal and septal segments. Left ventricular diastolic parameters were normal. Right Ventricle: The right ventricular size is normal. No increase in right ventricular wall thickness. Right ventricular systolic function is normal. Left Atrium: Left atrial size was moderately dilated. Right Atrium: Right atrial size was normal in size. Pericardium: There is no evidence of pericardial effusion. Mitral Valve: Nodular calcificatio nof anterior leaflet. The mitral valve is abnormal. There is mild thickening of the mitral valve leaflet(s). There is mild calcification of the mitral valve leaflet(s). Mild mitral valve regurgitation. No evidence of mitral valve stenosis. Tricuspid Valve: The tricuspid valve is normal in structure. Tricuspid valve regurgitation is mild . No evidence of tricuspid stenosis. Aortic Valve: Gradients increased since prior echo mean 17.8-> 18.4 peak 29.8->35.8 DVI similar 0.26-> 0.25 and AVA similar still > 1 cm2 partial  fusion of right and left cusps. The aortic valve is abnormal. There is severe calcifcation of the aortic valve. There is severe thickening of the aortic valve. Aortic valve  regurgitation is moderate. Aortic regurgitation PHT measures 369 msec. Moderate to severe aortic stenosis is present. Aortic valve mean gradient measures 18.4 mmHg. Aortic valve peak gradient measures 35.8 mmHg. Aortic valve area, by VTI measures 1.03 cm. Pulmonic Valve: The pulmonic valve was normal in structure. Pulmonic valve regurgitation is trivial. No evidence of pulmonic stenosis. Aorta: Aortic dilatation noted. There is moderate dilatation of the aortic root, measuring 42 mm. There is moderate dilatation of the ascending aorta, measuring 41 mm. Venous: The inferior vena cava is normal in size with greater than 50% respiratory variability, suggesting right atrial pressure of 3 mmHg. IAS/Shunts: No atrial level shunt detected by color flow Doppler.  LEFT VENTRICLE PLAX 2D LVIDd:         4.00 cm   Diastology LVIDs:         2.80 cm   LV e' medial:    8.56 cm/s LV PW:         1.20 cm   LV E/e' medial:  12.7 LV IVS:        1.60 cm   LV e' lateral:   13.33 cm/s LVOT diam:     2.30 cm   LV E/e' lateral: 8.2 LV SV:         65 LV SV Index:   36 LVOT Area:     4.15 cm  RIGHT VENTRICLE            IVC RVSP:           20.8 mmHg  IVC diam: 1.00 cm LEFT ATRIUM             Index        RIGHT ATRIUM           Index LA diam:        4.50 cm 2.50 cm/m   RA Pressure: 3.00 mmHg LA Vol (A2C):   85.6 ml 47.53 ml/m  RA Area:     13.90 cm LA Vol (A4C):   71.4 ml 39.65 ml/m  RA Volume:   32.00 ml  17.77 ml/m LA Biplane Vol: 78.2 ml 43.42 ml/m  AORTIC VALVE AV Area (Vmax):    1.07 cm AV Area (Vmean):   1.15 cm AV Area (VTI):     1.03 cm AV Vmax:           299.20 cm/s AV Vmean:          197.400 cm/s AV VTI:            0.633 m AV Peak Grad:      35.8 mmHg AV Mean Grad:      18.4 mmHg LVOT Vmax:         76.78 cm/s LVOT Vmean:        54.460 cm/s LVOT VTI:           0.156 m LVOT/AV VTI ratio: 0.25 AI PHT:            369 msec  AORTA Ao Root diam: 4.20 cm Ao Asc diam:  4.10 cm MITRAL VALVE                TRICUSPID VALVE MV Area (PHT): 3.94 cm     TR Peak grad:   17.8 mmHg MV Decel Time: 192 msec     TR Vmax:        211.00 cm/s MV E velocity: 108.84 cm/s  Estimated RAP:  3.00 mmHg  RVSP:           20.8 mmHg                              SHUNTS                             Systemic VTI:  0.16 m                             Systemic Diam: 2.30 cm Jenkins Rouge MD Electronically signed by Jenkins Rouge MD Signature Date/Time: 07/04/2022/12:30:19 PM    Final     ASSESSMENT AND PLAN:  This is a very pleasant 87 years old white male with IgG smoldering Loma diagnosed in April 2011. The patient has been on observation since 2011. His recent myeloma panel showed further increase in the free kappa light chain. His recent skeletal bone survey showed no concerning findings for disease progression.  He has a focus of avascular necrosis but no significant lytic lesions. His bone marrow bone marrow biopsy and aspirate showed further increase in the plasma cells to 15%. The patient underwent treatment with subcutaneous weekly Velcade as well as Revlimid and Decadron status post 7 cycles. Has been on observation since that time more than 3 years ago. He had repeat myeloma panel performed recently that showed no concerning finding for endorgan damage with no significant anemia and his renal function is better and no hypercalcemia.  His free kappa light chain had increased some compared to the previous lab work but the patient is currently asymptomatic. I recommended for him to continue on observation with repeat myeloma panel in 6 months. Regarding the surgical intervention for the aortic stenosis, I do not see any contraindication from the oncology standpoint with his multiple myeloma since the patient has been in observation and survivor with multiple myeloma  with the new medications has significantly improved over the last several years. For the history of prostate cancer he is currently followed by Dr. Alinda Money and Dr. Alen Blew for hormonal therapy. I will see him back for follow-up visit in 6 months for evaluation with repeat myeloma panel. He was advised to call immediately if he has any other concerning symptoms in the interval. The patient voices understanding of current disease status and treatment options and is in agreement with the current care plan. All questions were answered. The patient knows to call the clinic with any problems, questions or concerns. We can certainly see the patient much sooner if necessary.  Disclaimer: This note was dictated with voice recognition software. Similar sounding words can inadvertently be transcribed and may not be corrected upon review.

## 2022-08-01 DIAGNOSIS — M8588 Other specified disorders of bone density and structure, other site: Secondary | ICD-10-CM | POA: Diagnosis not present

## 2022-08-01 DIAGNOSIS — C9000 Multiple myeloma not having achieved remission: Secondary | ICD-10-CM | POA: Diagnosis not present

## 2022-08-01 DIAGNOSIS — Z23 Encounter for immunization: Secondary | ICD-10-CM | POA: Diagnosis not present

## 2022-08-01 DIAGNOSIS — I350 Nonrheumatic aortic (valve) stenosis: Secondary | ICD-10-CM | POA: Diagnosis not present

## 2022-08-01 DIAGNOSIS — I129 Hypertensive chronic kidney disease with stage 1 through stage 4 chronic kidney disease, or unspecified chronic kidney disease: Secondary | ICD-10-CM | POA: Diagnosis not present

## 2022-08-01 DIAGNOSIS — N183 Chronic kidney disease, stage 3 unspecified: Secondary | ICD-10-CM | POA: Diagnosis not present

## 2022-08-01 DIAGNOSIS — E032 Hypothyroidism due to medicaments and other exogenous substances: Secondary | ICD-10-CM | POA: Diagnosis not present

## 2022-08-01 DIAGNOSIS — I480 Paroxysmal atrial fibrillation: Secondary | ICD-10-CM | POA: Diagnosis not present

## 2022-08-01 DIAGNOSIS — E785 Hyperlipidemia, unspecified: Secondary | ICD-10-CM | POA: Diagnosis not present

## 2022-08-01 DIAGNOSIS — Z0000 Encounter for general adult medical examination without abnormal findings: Secondary | ICD-10-CM | POA: Diagnosis not present

## 2022-08-02 ENCOUNTER — Other Ambulatory Visit: Payer: Self-pay | Admitting: Family Medicine

## 2022-08-02 ENCOUNTER — Ambulatory Visit
Admission: RE | Admit: 2022-08-02 | Discharge: 2022-08-02 | Disposition: A | Discharge status: Home / Self Care | Payer: Medicare Other | Source: Ambulatory Visit | Attending: Family Medicine | Admitting: Family Medicine

## 2022-08-02 ENCOUNTER — Other Ambulatory Visit: Payer: Self-pay | Admitting: Interventional Cardiology

## 2022-08-02 DIAGNOSIS — R1084 Generalized abdominal pain: Secondary | ICD-10-CM

## 2022-08-03 NOTE — Telephone Encounter (Signed)
Pt's medication was sent to pt's pharmacy as requested. Confirmation received.  °

## 2022-08-09 ENCOUNTER — Telehealth: Payer: Self-pay | Admitting: Interventional Cardiology

## 2022-08-09 DIAGNOSIS — Z79899 Other long term (current) drug therapy: Secondary | ICD-10-CM

## 2022-08-09 DIAGNOSIS — I10 Essential (primary) hypertension: Secondary | ICD-10-CM

## 2022-08-09 NOTE — Telephone Encounter (Signed)
Returned call to patient.  Patient states he spoke with his oncologist regarding TAVR and states they are OK with patient proceeding.  Patient would like to know what the next steps are to proceed with TAVR. He asked what testing he may need or what consults, and would like to get this started.  Patient states he has been feeling as though he gets fatigued and will need to get up and move around to get his blood circulating and oxygen flowing. He denies any SOB.  Will forward to Dr. Tamala Julian to review and advise.

## 2022-08-09 NOTE — Telephone Encounter (Signed)
Patient calling in with question about the value replacements. Please advise

## 2022-08-17 NOTE — Telephone Encounter (Signed)
Called and spoke with patient.  Discussed Dr. Thompson Caul recommendations:  Liz Malady or Jardiance if we have not already tried SGLT-2. If we have and /or  SGLT-2 does not work, will need L and R heart cath with coronary angio before referral to structural clinic.    Discussed with patient purpose of Farxiga/Jardiance, the potential cost, available patient assistance and that a month of samples could be provided. Discussed that if after a month with no improvement in symptoms while on either Farxiga or Jardiance then it would be recommended he have a R/L heart cath performed with referral to structural clinic to consult for TAVR.  Patient states he would need some time to think about this and will let us know what he decides. Patient also asked if Dr. Tamala Julian could give him an idea of his prognosis with his aortic stenosis if he chose to not do anything to treat it.  Will forward to Dr. Tamala Julian to review.

## 2022-08-24 MED ORDER — DAPAGLIFLOZIN PROPANEDIOL 10 MG PO TABS: 10.0000 mg | ORAL_TABLET | Freq: Every day | ORAL | 11 refills | Status: DC

## 2022-08-24 NOTE — Telephone Encounter (Signed)
Returned call to patient.  Discussed Dr. Thompson Caul reply to his previous question in previous note. Per Dr. Tamala Julian: If shortness of breath is related to valve, there is an average 2-3 yr survival. Each person is different. Anticipate SOB to get progressively worse.    Patient would like to try Farxiga '10mg'$  QD for 1 month to see if symptoms improve.   Samples of Farxiga left at front desk for patient to pick-up.  BMET ordered and scheduled for 09/07/22.  Patient verbalized understanding of the above and expressed appreciation for call.

## 2022-08-24 NOTE — Telephone Encounter (Signed)
Pt is calling back to get update on valve replacement and discuss if this is possible.

## 2022-08-29 ENCOUNTER — Telehealth: Payer: Self-pay | Admitting: Interventional Cardiology

## 2022-08-29 NOTE — Telephone Encounter (Signed)
Returned call to patient. He states that he started the [place] on Thursday morning and developed a rash across the front of his chest up into his neck. He took another dose Friday morning and his rash worsened. He stopped taking the farxiga after that but is still having a lot of itching. Denies having any SOB or throat swelling. Patient is allergic to benadryl. Made patient aware that he can try increasing his fluids and try some topical cortisone cream to see if that helps. Made patient aware that if he develops any moderate SOB or throat swelling that he needs to go to the ER. Patient verbalized understanding. Made patient aware that I will forward to Dr. Tamala Julian to make aware.

## 2022-08-29 NOTE — Telephone Encounter (Signed)
Pt c/o medication issue:  1. Name of Medication:   dapagliflozin propanediol (FARXIGA) 10 MG TABS tablet    2. How are you currently taking this medication (dosage and times per day)? Stopped taking  3. Are you having a reaction (difficulty breathing--STAT)? no  4. What is your medication issue? Patient states he stopped taking the farxiga on Friday, because after starting it he started having a rash. He would like to know if there is something he can do for the itching.

## 2022-08-31 DIAGNOSIS — R350 Frequency of micturition: Secondary | ICD-10-CM | POA: Diagnosis not present

## 2022-09-02 ENCOUNTER — Encounter: Payer: Self-pay | Admitting: *Deleted

## 2022-09-02 NOTE — Telephone Encounter (Signed)
I called and let patient know that we would cancel his lab appointment and I will add Wilder Glade to his allergy list (rash).    He has seen urology and oncology and has spoken w PCP as well.  He is interested in progressing with work up for his aortic valve stenosis.  Reviewed chart to see he was to try [place] for a month to see if helped his symptoms.  Since he was unable to tolerate this,  I reviewed the info from Dr. Thompson Caul note about progressive SOB and 2-3 yr survival if his symptoms are r/t his valve.  He wants to know when we can do the next steps of heart cath/consult w structural heart clinic since he was not able to tolerate [place].    Aware we would forward the message to Dr. Tamala Julian for further recommendations.  Pt appreciative for assistance today.

## 2022-09-02 NOTE — Telephone Encounter (Signed)
Patient is calling wanting to know if he needs to keep his lab appt due to stopping this medication. Please advise.

## 2022-09-05 ENCOUNTER — Telehealth: Payer: Self-pay | Admitting: Interventional Cardiology

## 2022-09-05 NOTE — Telephone Encounter (Signed)
Spoke with patient, states he feels better today. Appointment to see Dr. Tamala Julian made for 09/06/2022 at 9:20AM to discuss/arrange for R/L heart cath for TAVR workup.  Will forward to Dr. Tamala Julian to review.

## 2022-09-05 NOTE — Progress Notes (Unsigned)
Cardiology Office Note:    Date:  09/06/2022   ID:  Thomas [name], DOB 10/23/1934, MRN [id number]  PCP:  [name], Cynthia, MD  Cardiologist:  Thomas W Smith III, MD   Referring MD: [name], Cynthia, MD   Chief Complaint  Patient presents with   Atrial Fibrillation    With AV conduction system disease   Hypertension   Cardiac Valve Problem    Moderate to severe aortic stenosis AV conduction system disease    History of Present Illness:    Thomas [name] is a 87 y.o. male with a hx of permanent atrial fibrillation, chronic diastolic heart failure, hypertension, chronic kidney disease, unable to use anticoagulation therapy due to recurrent GU bleeding, and moderately severe aortic stenosis.  Other medical issues include metastatic prostate CA (Lupron) and indolent multiple myeloma.   Failed Farxiga due to generalized rash.  Telephone message with Kristie 09/05/2022: Spoke with patient, states he feels better today. Appointment to see Dr. Smith made for 09/06/2022 at 9:20AM to discuss/arrange for R/L heart cath for TAVR workup      No worsening in exertional dyspnea and fatigue.  Wife and daughter mention 4 separate episodes of near syncope dating back to January 2022.  There was another episode in April 2022, an episode in August 2023 and an episode last week on September 02, 2022.  During these episodes he feels weak and gets somewhat groggy and feels that he may pass out.  EKG today demonstrates atrial flutter with 4-1 AV block.  He is on low-dose digoxin.  Past Medical History:  Diagnosis Date   BPH (benign prostatic hypertrophy)    Chronic diastolic heart failure (HCC) 12/27/2013   Chronic kidney disease    Hx of bilateral renal cysts.   Encounter for antineoplastic chemotherapy 02/20/2018   Glaucoma    Heart murmur    History of atrial fibrillation 03/09/09   Hx of epiglottitis    2011   Hypercholesterolemia    Hypertension    Long term current use of amiodarone  11/07/2013   Amiodarone    Mild aortic stenosis    OSA on CPAP 06/26/2014   Paroxysmal atrial fibrillation (HCC) 11/07/2013   Prostate cancer (HCC) 7/12   T1C Ad Ca  s/p TURP, observation only   Renal insufficiency 09/06/2011   Sinus node dysfunction (HCC) 08/14/2014   Sleep apnea    Smoldering multiple myeloma     Past Surgical History:  Procedure Laterality Date   ADRENALECTOMY  02/12/11   Left, for Pheochromocytoma   CARDIOVERSION N/A 12/26/2013   Procedure: CARDIOVERSION;  Surgeon: Paula V Ross, MD;  Location: [facility];  Service: Cardiovascular;  Laterality: N/A;   CHOLECYSTECTOMY  02/12/11   GASTRECTOMY  1984   S/P gastrectomy for ulcer   KNEE SURGERY  1971   S/P Right knee surgery   TEE WITHOUT CARDIOVERSION N/A 12/26/2013   Procedure: TRANSESOPHAGEAL ECHOCARDIOGRAM (TEE);  Surgeon: Paula V Ross, MD;  Location: [facility];  Service: Cardiovascular;  Laterality: N/A;   TRANSURETHRAL RESECTION OF PROSTATE  7/12    Current Medications: Current Meds  Medication Sig   acetaminophen (TYLENOL) 500 MG tablet Take 1,000 mg by mouth as needed.    Cholecalciferol (VITAMIN D3) 50 MCG (2000 UT) capsule Take 1 capsule by mouth daily.    dorzolamide-timolol (COSOPT) 22.3-6.8 MG/ML ophthalmic solution 1 drop 2 (two) times daily.   latanoprost (XALATAN) 0.005 % ophthalmic solution INSTILL 1 DROP INTO BOTH EYES EVERY DAY AT NIGHT     tamsulosin (FLOMAX) 0.4 MG CAPS capsule TAKE ONE CAPSULE BY MOUTH EVERY EVENING AFTER SUPPER   [DISCONTINUED] apixaban (ELIQUIS) 2.5 MG TABS tablet Take 1 tablet (2.5 mg total) by mouth 2 (two) times daily.   [DISCONTINUED] digoxin (LANOXIN) 0.125 MG tablet TAKE 1 TABLET (0.125 MG TOTAL) BY MOUTH 3 (THREE) TIMES A WEEK. TAKE ON MONDAYS, [place], AND FRIDAYS.   [DISCONTINUED] hydrochlorothiazide (MICROZIDE) 12.5 MG capsule TAKE ONE CAPSULE BY MOUTH ONCE DAILY     Allergies:   Benadryl [diphenhydramine hcl], Ceftriaxone sodium, Contrast media [iodinated contrast  media], Nsaids, Penicillins, Farxiga [dapagliflozin], Codeine, Ibuprofen, Levaquin [levofloxacin], Oxybutynin, Tramadol hcl, Chlorhexidine, and Furosemide   Social History   Socioeconomic History   Marital status: Married    Spouse name: Not on file   Number of children: 3   Years of education: Not on file   Highest education level: Not on file  Occupational History    Employer: RETIRED  Tobacco Use   Smoking status: Never   Smokeless tobacco: Never  Vaping Use   Vaping Use: Never used  Substance and Sexual Activity   Alcohol use: No   Drug use: No   Sexual activity: Not Currently  Other Topics Concern   Not on file  Social History Narrative   Not on file   Social Determinants of Health   Financial Resource Strain: Not on file  Food Insecurity: Not on file  Transportation Needs: Not on file  Physical Activity: Not on file  Stress: Not on file  Social Connections: Not on file     Family History: The patient's family history includes Kidney Stones in his brother; Liver disease in his sister, sister, and sister; Melanoma in his brother; Polymyositis in his mother; Prostate cancer in his brother and father.  ROS:   Please see the history of present illness.    No bleeding.  Is on low-dose anticoagulation therapy.  All other systems reviewed and are negative.  EKGs/Labs/Other Studies Reviewed:    The following studies were reviewed today:  ECHOCARDIOGRAPHY 07/04/2022: IMPRESSIONS    1. Left ventricular ejection fraction, by estimation, is 60 to 65%. The  left ventricle has normal function. The left ventricle has no regional  wall motion abnormalities. There is moderate asymmetric left ventricular  hypertrophy of the basal and septal  segments. Left ventricular diastolic parameters were normal.   2. Right ventricular systolic function is normal. The right ventricular  size is normal.   3. Left atrial size was moderately dilated.   4. Nodular calcificatio nof  anterior leaflet . The mitral valve is  abnormal. Mild mitral valve regurgitation. No evidence of mitral stenosis.   5. Gradients increased since prior echo mean 17.8-> 18.4 peak 29.8->35.8  DVI similar 0.26-> 0.25 and AVA similar still > 1 cm2 partial fusion of  right and left cusps . The aortic valve is abnormal. There is severe  calcifcation of the aortic valve. There   is severe thickening of the aortic valve. Aortic valve regurgitation is  moderate. Moderate to severe aortic valve stenosis.   6. Aortic dilatation noted. There is moderate dilatation of the aortic  root, measuring 42 mm. There is moderate dilatation of the ascending  aorta, measuring 41 mm.   7. The inferior vena cava is normal in size with greater than 50%  respiratory variability, suggesting right atrial pressure of 3 mmHg.    EKG:  EKG atrial flutter with 4-1 AV block heart rate of 60.  When compared to August 2023,  the heart rate is slower on today's EKG.  Recent Labs: 05/25/2022: B Natriuretic Peptide 229.4 07/13/2022: ALT 21; BUN 25; Creatinine 1.48; Hemoglobin 14.9; Platelet Count 160; Potassium 3.7; Sodium 135  Recent Lipid Panel    Component Value Date/Time   CHOL 164 02/25/2020 0842   TRIG 131 02/25/2020 0842   HDL 41 02/25/2020 0842   CHOLHDL 4.0 02/25/2020 0842   LDLCALC 100 (H) 02/25/2020 0842    Physical Exam:    VS:  BP (!) 118/56   Pulse 60   Ht 5' 4" (1.626 m)   Wt 153 lb 12.8 oz (69.8 kg)   SpO2 98%   BMI 26.40 kg/m     Wt Readings from Last 3 Encounters:  09/06/22 153 lb 12.8 oz (69.8 kg)  07/20/22 151 lb 1 oz (68.5 kg)  07/11/22 151 lb 12.8 oz (68.9 kg)     GEN: Elderly. No acute distress HEENT: Normal NECK: No JVD. LYMPHATICS: No lymphadenopathy CARDIAC: 2/6 crescendo decrescendo systolic murmur right upper sternal and left mid sternal border.  No diastolic murmur. RRR no gallop, or edema. VASCULAR:  Normal Pulses. No bruits. RESPIRATORY:  Clear to auscultation without rales,  wheezing or rhonchi  ABDOMEN: Soft, non-tender, non-distended, No pulsatile mass, MUSCULOSKELETAL: No deformity  SKIN: Warm and dry NEUROLOGIC:  Alert and oriented x 3 PSYCHIATRIC:  Normal affect   ASSESSMENT:    1. Moderate aortic stenosis   2. Near syncope   3. Persistent atrial fibrillation (HCC)   4. Chronic diastolic heart failure (HCC)   5. OSA on CPAP   6. Stage 3b chronic kidney disease (HCC)   7. Essential hypertension   8. Chronic anticoagulation   9. Paroxysmal atrial fibrillation (HCC)    PLAN:    In order of problems listed above:  We had significant discussion today concerning proceeding with coronary angiography and referring the patient to the structural heart team.  In shared decision making, the patient has decided to hold off on any further workup at this time.  I will refer him to either Dr. McAlhany or Dr. Cooper who on the structural team to follow him along over the next year or so in preparation for possible TAVR. Stop digoxin, and then performed a 1 week monitor to see if patient is having significant bradycardia that could be causing the near syncopal episodes. Continue anticoagulation Was unable to tolerate Farxiga due to widespread rash Continue CPAP  Will have the patient followed by either Dr. Cooper or Dr. McAlhany to help make a decision about whether the valve needs to be treated.  If they decide against valve treatment he can then be referred to a general cardiologist at some later date.  Office visit.  Shared decision making concerning the pace of evaluation for possible TAVR.  If he gets TAVR he would be at high risk for pacemaker implantation.   Medication Adjustments/Labs and Tests Ordered: Current medicines are reviewed at length with the patient today.  Concerns regarding medicines are outlined above.  Orders Placed This Encounter  Procedures   LONG TERM MONITOR (3-14 DAYS)   EKG 12-Lead   Meds ordered this encounter  Medications    apixaban (ELIQUIS) 2.5 MG TABS tablet    Sig: Take 1 tablet (2.5 mg total) by mouth 2 (two) times daily.    Dispense:  180 tablet    Refill:  3    90 day supply   hydrochlorothiazide (MICROZIDE) 12.5 MG capsule    Sig: Take 1 capsule (  12.5 mg total) by mouth daily.    Dispense:  90 capsule    Refill:  3    This prescription was filled on 12/30/2021. Any refills authorized will be placed on file.    Patient Instructions  Medication Instructions:  Your physician has recommended you make the following change in your medication:   1) STOP digoxin (Lanoxin)  *If you need a refill on your cardiac medications before your next appointment, please call your pharmacy*  Testing/Procedures: Your physician has requested that you wear a Zio heart monitor for 7 days. This will be mailed to your home with instructions on how to apply the monitor and how to return it when finished. Please allow 2 weeks after returning the heart monitor before our office calls you with the results.   **Wait 7-10 days after stopping Digoxin to put the heart monitor on.**  Follow-Up: At [facility], you and your health needs are our priority.  As part of our continuing mission to provide you with exceptional heart care, we have created designated Provider Care Teams.  These Care Teams include your primary Cardiologist (physician) and Advanced Practice Providers (APPs -  Physician Assistants and Nurse Practitioners) who all work together to provide you with the care you need, when you need it.  Your next appointment:   4 month(s)  The format for your next appointment:   In Person  Provider:   Sherren Mocha, MD or Lauree Chandler, MD  Important Information About Sugar         Signed, [name], MD  09/06/2022 10:17 AM    [place]

## 2022-09-05 NOTE — Telephone Encounter (Signed)
Scheduled patient to see Dr. Tamala Julian on 09/06/2022 at 9:20AM. Referring to other phone note sent 09/05/22, patient experienced dizziness and SOB over the weekend, reports he feels better today.  Patient verbalized understanding and expressed appreciation for call.

## 2022-09-05 NOTE — Telephone Encounter (Signed)
STAT if patient feels like he/she is going to faint   Are you dizzy now?  No   Do you feel faint or have you passed out?  No   Do you have any other symptoms?  SOB  Have you checked your HR and BP (record if available)?  Yesterday HR was a little low at 63, BP was 115/65  Friday, 12/01, at the end of bible study patient had an episode of dizziness. He states he was unable to move and if he tried to stand up he felt like he would fall--he doesn't know if this feeling  was due to his inability to balance himself or if he would faint if he tried to stand. Patient has also been having SOB and he states he is having a strange feeling--pain and tingling radiating from arms into upper torso. Patient states in 10/2020 he had the same thing happen and his PCP ordered an  MRI and carotid. He thinks it may have been a TIA and assumes he may need more testing.

## 2022-09-06 ENCOUNTER — Encounter: Payer: Self-pay | Admitting: Interventional Cardiology

## 2022-09-06 ENCOUNTER — Ambulatory Visit (INDEPENDENT_AMBULATORY_CARE_PROVIDER_SITE_OTHER): Payer: Medicare Other

## 2022-09-06 ENCOUNTER — Ambulatory Visit: Payer: Medicare Other | Attending: Interventional Cardiology | Admitting: Interventional Cardiology

## 2022-09-06 VITALS — BP 118/56 | HR 60 | Ht 64.0 in | Wt 153.8 lb

## 2022-09-06 DIAGNOSIS — N1832 Chronic kidney disease, stage 3b: Secondary | ICD-10-CM

## 2022-09-06 DIAGNOSIS — Z7901 Long term (current) use of anticoagulants: Secondary | ICD-10-CM | POA: Diagnosis not present

## 2022-09-06 DIAGNOSIS — I4819 Other persistent atrial fibrillation: Secondary | ICD-10-CM | POA: Diagnosis not present

## 2022-09-06 DIAGNOSIS — I480 Paroxysmal atrial fibrillation: Secondary | ICD-10-CM | POA: Diagnosis not present

## 2022-09-06 DIAGNOSIS — I5032 Chronic diastolic (congestive) heart failure: Secondary | ICD-10-CM

## 2022-09-06 DIAGNOSIS — I10 Essential (primary) hypertension: Secondary | ICD-10-CM | POA: Diagnosis not present

## 2022-09-06 DIAGNOSIS — G4733 Obstructive sleep apnea (adult) (pediatric): Secondary | ICD-10-CM

## 2022-09-06 DIAGNOSIS — I350 Nonrheumatic aortic (valve) stenosis: Secondary | ICD-10-CM

## 2022-09-06 DIAGNOSIS — R55 Syncope and collapse: Secondary | ICD-10-CM

## 2022-09-06 MED ORDER — APIXABAN 2.5 MG PO TABS: 2.5000 mg | ORAL_TABLET | Freq: Two times a day (BID) | ORAL | 3 refills | Status: DC

## 2022-09-06 MED ORDER — HYDROCHLOROTHIAZIDE 12.5 MG PO CAPS: 12.5000 mg | ORAL_CAPSULE | Freq: Every day | ORAL | 3 refills | Status: DC

## 2022-09-06 NOTE — Progress Notes (Unsigned)
Enrolled for [employer] to mail a ZIO XT long term holter monitor to the patients address on file.  

## 2022-09-06 NOTE — Patient Instructions (Signed)
Medication Instructions:  Your physician has recommended you make the following change in your medication:   1) STOP digoxin (Lanoxin)  *If you need a refill on your cardiac medications before your next appointment, please call your pharmacy*  Testing/Procedures: Your physician has requested that you wear a Zio heart monitor for 7 days. This will be mailed to your home with instructions on how to apply the monitor and how to return it when finished. Please allow 2 weeks after returning the heart monitor before our office calls you with the results.   **Wait 7-10 days after stopping Digoxin to put the heart monitor on.**  Follow-Up: At [facility], you and your health needs are our priority.  As part of our continuing mission to provide you with exceptional heart care, we have created designated Provider Care Teams.  These Care Teams include your primary Cardiologist (physician) and Advanced Practice Providers (APPs -  Physician Assistants and Nurse Practitioners) who all work together to provide you with the care you need, when you need it.  Your next appointment:   4 month(s)  The format for your next appointment:   In Person  Provider:   Sherren Mocha, MD or Lauree Chandler, MD  Important Information About Sugar

## 2022-09-07 ENCOUNTER — Other Ambulatory Visit: Payer: Medicare Other

## 2022-09-14 DIAGNOSIS — R55 Syncope and collapse: Secondary | ICD-10-CM

## 2022-09-17 ENCOUNTER — Other Ambulatory Visit: Payer: Self-pay | Admitting: Interventional Cardiology

## 2022-09-24 DIAGNOSIS — R55 Syncope and collapse: Secondary | ICD-10-CM | POA: Diagnosis not present

## 2022-10-13 ENCOUNTER — Encounter: Payer: Self-pay | Admitting: Cardiovascular Disease

## 2022-10-13 ENCOUNTER — Ambulatory Visit: Payer: Medicare Other | Attending: Cardiovascular Disease | Admitting: Cardiovascular Disease

## 2022-10-13 VITALS — BP 122/56 | HR 88 | Ht 63.0 in | Wt 160.6 lb

## 2022-10-13 DIAGNOSIS — Z7901 Long term (current) use of anticoagulants: Secondary | ICD-10-CM | POA: Diagnosis not present

## 2022-10-13 DIAGNOSIS — G4733 Obstructive sleep apnea (adult) (pediatric): Secondary | ICD-10-CM

## 2022-10-13 DIAGNOSIS — I350 Nonrheumatic aortic (valve) stenosis: Secondary | ICD-10-CM

## 2022-10-13 DIAGNOSIS — I4819 Other persistent atrial fibrillation: Secondary | ICD-10-CM | POA: Diagnosis not present

## 2022-10-13 NOTE — Patient Instructions (Signed)
Medication Instructions:  Your physician recommends that you continue on your current medications as directed. Please refer to the Current Medication list given to you today.  *If you need a refill on your cardiac medications before your next appointment, please call your pharmacy*   Lab Work: NONE If you have labs (blood work) drawn today and your tests are completely normal, you will receive your results only by: [place] (if you have MyChart) OR A paper copy in the mail If you have any lab test that is abnormal or we need to change your treatment, we will call you to review the results.   Testing/Procedures: NONE   Follow-Up: At [facility], you and your health needs are our priority.  As part of our continuing mission to provide you with exceptional heart care, we have created designated Provider Care Teams.  These Care Teams include your primary Cardiologist (physician) and Advanced Practice Providers (APPs -  Physician Assistants and Nurse Practitioners) who all work together to provide you with the care you need, when you need it.  We recommend signing up for the patient portal called "MyChart".  Sign up information is provided on this After Visit Summary.  MyChart is used to connect with patients for Virtual Visits (Telemedicine).  Patients are able to view lab/test results, encounter notes, upcoming appointments, etc.  Non-urgent messages can be sent to your provider as well.   To learn more about what you can do with MyChart, go to [email or web address].    Your next appointment:   4-5 month(s)  Provider:   Shelva Majestic, MD

## 2022-10-13 NOTE — Progress Notes (Signed)
Patient ID: Thomas [name], male   DOB: 02-13-1935, 88 y.o.   MRN: [id number]       HPI: Thomas [name], is a 88 y.o. male who is followed by Dr. Tamala Julian for cardiology care.  I initially saw him  in September 2015 for sleep clinic evaluation following initiation of CPAP therapy.  I last saw him in November 2022.  He presents for a 15 month follow-up sleep evaluation.  Thomas [name] has a history of paroxysmal atrial fibrillation and  has been maintaining sinus rhythm with sinus bradycardia.  He has a history of hypertension, hyperlipidemia, documented bilateral renal cysts, and mild aortic valve stenosis, and multiple myeloma.  Due to concerns for obstructive sleep apnea, particularly with his atrial fibrillation, history he was referred by Dr. Tamala Julian for a diagnostic polysomnogram in 2015.   His diagnostic polysomnogram was done at the [facility] on 02/05/2014 revealed severe obstructive sleep apnea with an AHI of 45.7 per hour.  He had oxygen desaturation to 86% and there was evidence for moderate snoring.  There were frequent periodic limb movements with an index of 36 per hour with 20.3 per hour to arousal.  He subsequently was referred for a CPAP titration in a CPAP pressure of 9 cm was recommended.  Of note, there was significant improvement in his periodic leg movements with CPAP therapy.  A download from 05/24/2014 to 06/22/2014 showed excellent compliance with 100% days of usage.  He had 97% days with use greater than 4 hours and was meeting Medicare compliance standards.  He has an AirSense 10 AutoSet unit and is at a set pressure of 9 cm.  His AHI is now excellent at 1.2.  There is no leak.  He has a Special educational needs teacher, fullface mask (medium).  When I saw him in February 2018 he was 100% compliant with CPAP.  Download  from 10/23/2016 through 11/21/2016, which confirmed 100% compliance.  CPAP was set at 9 cm set pressure; AHI was 1.0.  He was using a full facemask.  His  nocturia had significantly improved with CPAP treatment. His Epworth Sleepiness Scale score is improved and was 6 compared to previously being elevated  Epworth Sleepiness Scale: Situation   Chance of Dozing/Sleeping (0 = never , 1 = slight chance , 2 = moderate chance , 3 = high chance )   sitting and reading 2   watching TV 1   sitting inactive in a public place 0   being a passenger in a motor vehicle for an hour or more 0   lying down in the afternoon 3   sitting and talking to someone 0   sitting quietly after lunch (no alcohol) 0   while stopped for a few minutes in traffic as the driver 0   Total Score  6   He continues to be followed by Dr. Tamala Julian for his paroxysmal atrial fibrillation, chronic diastolic heart failure, hypertension, chronic kidney disease and mild aortic stenosis.  Remotely he was not on anticoagulation secondary to  recurrent GU bleeding, but currently is on low-dose Eliquis.  He has undergone chemotherapy for multiple myeloma as well as treatment for prostate CA.  When I saw him in October 2019 he had  lost 49 pounds with his recent illnesses.  His primary physician question whether or not he still needed to use CPAP therapy.  In the office today I calculated a new Epworth Sleepiness Scale score and this endorsed at 11 and was  consistent with mild daytime sleepiness.  He has a ResMed air sense 10 AutoSet unit and has been set at a 9 cm pressure.  He was using an old air fit F10 full facemask and has had difficulty with pressure on the bridge of his nose.  A download from June 25, 2018 through July 24, 2018 shows 100% compliance;  averaging 6 hours and 26 minutes of CPAP use.  AHI is 3.1.  There was no leak.  During that evaluation I recommended he switch to the DreamWear technology full facemask in light of his difficulty with the older facemask on the bridge of his nose.  We discussed the importance of increasing sleep duration since his CPAP use was just under 6  hours and 30 minutes.  I saw him in November 2020 and he continued to be in  permanent atrial fibrillation and was on diltiazem CD 120 mg and Eliquis 2.5 mg twice a day.  He last saw Dr. Tamala Julian in February 2020.  A download was obtained today from July 13, 2019 through August 11, 2019.  Usage days 87%.  He has not been able to sleep for long duration since his wife had fallen and had broken 4 vertebrae and has had significant difficulty resulting from this.  As result he has been caring for her and oftentimes gets up during the night.  On his  download average use was 5 hours per night.  At his 9 cm water pressure, AHI however continued to be excellent at 1.2.  He typically goes to bed at 11:30 and wakes up at 630.  He likes the current air fit F 30 medium size mask that he is using that has a Pharmacologist compared to his old full facemask.  However, at times the tubing which from the front sometimes gets in the way when he tries to turn from side to side.  During that evaluation I recommended he switch to the ResMed air fit F 30i mask where the tubing arises from the crown of the head and does not interfere with him moving side to side.  At his August 24, 2020 evaluation he remained stable.   Previously he had significant weight loss as result of his illnesses.  But over the past year has had weight gain from 154 up to 173 pounds.  He admits to continuing to feel well with CPAP therapy.  He is unaware of breakthrough snoring.  A download was obtained from October 21 through August 21, 2020.  He is meeting compliance standards.  Average use is 6 hours and 11 minutes.  At a 9 cm set pressure, AHI is 4.2.  There is no mask leak.  An Epworth Sleepiness Scale score was recalculated in the office today and this endorsed at 5 arguing against residual daytime sleepiness.   I last saw him on August 31, 2021. He has continued to be followed by Dr. Tamala Julian.  He underwent an echo Doppler study in  August 2022 which showed EF of 55 to 60% with mild LVH.  There was an increase in there is aortic valve gradient with a peak gradient of 38 and a mean gradient of 19.  There was mild aortic root dilatation.  From a sleep perspective, he continues to use CPAP therapy.  Due to this 5G upgrade, his machine is no longer wireless.  [facility] is his DME company.  We were able to obtain data today with a download from October 31  through August 31, 2021.  Usage is excellent at 100%.  There is no mask leak.  His pressures are set at a range of 8 to 14 cm of water and his 95th percentile pressure is 13.6 with a maximum average pressure of 13.9.  Average use however was just shy of 6 hours at 5 hours and 56 minutes.  He states he typically goes to bed between 11:15 and 11:30 and can wake up anywhere between 6 and 7:30 AM.  He has issues with low back pain and the bad right knee and walks with a cane.   Since I last saw him, he was recently seen by Dr. Tamala Julian on September 06, 2022 before his retirement.  With his progressive aortic valve stenosis documented on his last echo in October 2023 now felt to be moderately severe he will be following up cardiology with Dr. Burt Knack.  From a sleep perspective, he needs to be referred to a new DME company since [facility] is no longer providing CPAP care.  His machine set up date is March 26, 2014.  He brought his machine to the office today so that we could manually download his device since it is no longer wireless with 5G upgrade.  His compliance continues to be excellent with 100% use with average use of 6 hours and 49 minutes.  His device is set at a pressure range of 10 to 16 cm.  His 95th percentile pressure is 15.5 with maximum average pressure 15.9.  AHI is excellent at 1.9.  He believes he is sleeping well.  He is in need for new machine.  An Epworth Sleepiness Scale score was calculated in the office today and this endorsed at 8 arguing against residual  daytime sleepiness.  He presents for evaluation.   Past Medical History:  Diagnosis Date   BPH (benign prostatic hypertrophy)    Chronic diastolic heart failure ([place]) 12/27/2013   Chronic kidney disease    Hx of bilateral renal cysts.   Encounter for antineoplastic chemotherapy 02/20/2018   Glaucoma    Heart murmur    History of atrial fibrillation 03/09/09   Hx of epiglottitis    2011   Hypercholesterolemia    Hypertension    Long term current use of amiodarone 11/07/2013   Amiodarone    Mild aortic stenosis    OSA on CPAP 06/26/2014   Paroxysmal atrial fibrillation ([place]) 11/07/2013   Prostate cancer ([place]) 7/12   T1C Ad Ca  s/p TURP, observation only   Renal insufficiency 09/06/2011   Sinus node dysfunction ([place]) 08/14/2014   Sleep apnea    Smoldering multiple myeloma     Past Surgical History:  Procedure Laterality Date   ADRENALECTOMY  02/12/11   Left, for Pheochromocytoma   CARDIOVERSION N/A 12/26/2013   Procedure: CARDIOVERSION;  Surgeon: [name], MD;  Location: [facility];  Service: Cardiovascular;  Laterality: N/A;   CHOLECYSTECTOMY  02/12/11   GASTRECTOMY  1984   S/P gastrectomy for ulcer   [facility]   S/P Right knee surgery   TEE WITHOUT CARDIOVERSION N/A 12/26/2013   Procedure: TRANSESOPHAGEAL ECHOCARDIOGRAM (TEE);  Surgeon: [name], MD;  Location: [facility];  Service: Cardiovascular;  Laterality: N/A;   TRANSURETHRAL RESECTION OF PROSTATE  7/12    Allergies  Allergen Reactions   Benadryl [Diphenhydramine Hcl] Other (See Comments)    Bad dreams -  Hallucinations.   Ceftriaxone Sodium Hives   Contrast Media [Iodinated Contrast Media] Other (  See Comments)    Poor kidney function   Nsaids Other (See Comments)    REACTION: BLEEDING ULCER   Penicillins Hives   Farxiga [Dapagliflozin] Rash   Codeine     Other reaction(s): loopy   Ibuprofen     Other reaction(s): hives   Levaquin [Levofloxacin]    Oxybutynin Other (See Comments)     Lightheaded, dizzy, unbalanced. "Disturbed my equilibrium."   Tramadol Hcl     Other reaction(s): dizzy/nauseated   Chlorhexidine Rash    Use alcohol for skin prep per patient request.    Furosemide Diarrhea, Itching and Rash    Current Outpatient Medications  Medication Sig Dispense Refill   acetaminophen (TYLENOL) 500 MG tablet Take 1,000 mg by mouth as needed.      apixaban (ELIQUIS) 2.5 MG TABS tablet Take 1 tablet (2.5 mg total) by mouth 2 (two) times daily. 180 tablet 3   Cholecalciferol (VITAMIN D3) 50 MCG (2000 UT) capsule Take 1 capsule by mouth daily.      dorzolamide-timolol (COSOPT) 22.3-6.8 MG/ML ophthalmic solution 1 drop 2 (two) times daily.     latanoprost (XALATAN) 0.005 % ophthalmic solution INSTILL 1 DROP INTO BOTH EYES EVERY DAY AT NIGHT  6   hydrochlorothiazide (MICROZIDE) 12.5 MG capsule Take 1 capsule (12.5 mg total) by mouth daily. (Patient not taking: Reported on 10/13/2022) 90 capsule 1   tamsulosin (FLOMAX) 0.4 MG CAPS capsule TAKE ONE CAPSULE BY MOUTH EVERY EVENING AFTER SUPPER (Patient not taking: Reported on 10/13/2022) 30 capsule 5   No current facility-administered medications for this visit.    Social History   Socioeconomic History   Marital status: Married    Spouse name: Not on file   Number of children: 3   Years of education: Not on file   Highest education level: Not on file  Occupational History    Employer: RETIRED  Tobacco Use   Smoking status: Never   Smokeless tobacco: Never  Vaping Use   Vaping Use: Never used  Substance and Sexual Activity   Alcohol use: No   Drug use: No   Sexual activity: Not Currently  Other Topics Concern   Not on file  Social History Narrative   Not on file   Social Determinants of Health   Financial Resource Strain: Not on file  Food Insecurity: Not on file  Transportation Needs: Not on file  Physical Activity: Not on file  Stress: Not on file  Social Connections: Not on file  Intimate Partner  Violence: Not on file     ROS General: Negative; No fevers, chills, or night sweats HEENT: Negative; No changes in vision or hearing, sinus congestion, difficulty swallowing Pulmonary: Negative; No cough, wheezing, shortness of breath, hemoptysis Cardiovascular: Positive for permanent atrial fibrillation; aortic stenosis GI: Negative; No nausea, vomiting, diarrhea, or abdominal pain GU: Negative; No dysuria, hematuria, or difficulty voiding Musculoskeletal: Back pain, walks with a cane Hematologic: Positive for multiple myeloma; and prostate CA Endocrine: Negative; no heat/cold intolerance Neuro: Negative; no changes in balance, headaches Skin: Negative; No rashes or skin lesions Psychiatric: Negative; No behavioral problems, depression Sleep: Positive for OSA on CPAP; No daytime sleepiness, hypersomnolence, bruxism, restless legs, hypnogognic hallucinations, no cataplexy   Physical Exam BP (!) 122/56 (BP Location: Left Arm, Patient Position: Sitting, Cuff Size: Normal)   Pulse 88   Ht '5\' 3"'$  (1.6 m)   Wt 160 lb 9.6 oz (72.8 kg)   SpO2 97%   BMI 28.45 kg/m    Repeat  blood pressure by me was 118/60  Wt Readings from Last 3 Encounters:  10/13/22 160 lb 9.6 oz (72.8 kg)  09/06/22 153 lb 12.8 oz (69.8 kg)  07/20/22 151 lb 1 oz (68.5 kg)    General: Alert, oriented, no distress.  Skin: normal turgor, no rashes, warm and dry HEENT: Normocephalic, atraumatic. Pupils equal round and reactive to light; sclera anicteric; extraocular muscles intact; Nose without nasal septal hypertrophy Mouth/Parynx benign; Mallinpatti scale 3 Neck: No JVD, no carotid bruits; normal carotid upstroke Lungs: clear to ausculatation and percussion; no wheezing or rales Chest wall: without tenderness to palpitation Heart: PMI not displaced, irregular irregular consistent with his permanent atrial fibrillation., s1 s2 normal, 2/6 systolic murmur in the aortic area, patible with his aortic stenosis., no  diastolic murmur, no rubs, gallops, thrills, or heaves Abdomen: soft, nontender; no hepatosplenomehaly, BS+; abdominal aorta nontender and not dilated by palpation. Back: no CVA tenderness Pulses 2+ Musculoskeletal: full range of motion, normal strength, no joint deformities Extremities: no clubbing cyanosis or edema, Homan's sign negative  Neurologic: grossly nonfocal; Cranial nerves grossly wnl Psychologic: Normal mood and affect  I personally reviewed the ECG from September 06, 2022 which shows atrial fibrillation at 60 bpm.  Nonspecific ST abnormality  August 31, 2021 ECG (independently read by me):  Atrial fibrillation at 78  August 24, 2020 ECG (independently read by me): Atrial fibrillation at 63; QTc 417 msec    November 2020 ECG (independently read by me): Atrial fibrillation at 78; QTc 414 msec  October 2019 ECG (independently read by me): Atrial fibrillation at 84 bpm.  QTc interval 460 ms.  February 2018 ECG (independently read by me): Sinus bradycardia at 59 bpm with first-degree AV block (PR interval 21 6/22).  QTc interval 451 ms.  LABS:     Latest Ref Rng & Units 07/13/2022    1:50 PM 05/25/2022    3:57 PM 05/25/2022    1:57 PM  BMP  Glucose 70 - 99 mg/dL 118   133   BUN 8 - 23 mg/dL 25   22   Creatinine 0.61 - 1.24 mg/dL 1.48   1.80   Sodium 135 - 145 mmol/L 135  135  131   Potassium 3.5 - 5.1 mmol/L 3.7  4.1  3.7   Chloride 98 - 111 mmol/L 102   99   CO2 22 - 32 mmol/L 27   23   Calcium 8.9 - 10.3 mg/dL 10.1   9.6       Latest Ref Rng & Units 07/13/2022    1:50 PM 01/11/2022   11:04 AM 09/15/2021   10:53 AM  Hepatic Function  Total Protein 6.5 - 8.1 g/dL 6.8  6.5  6.9   Albumin 3.5 - 5.0 g/dL 4.1  4.0  4.0   AST 15 - 41 U/L '24  16  19   '$ ALT 0 - 44 U/L '21  10  13   '$ Alk Phosphatase 38 - 126 U/L 68  91  96   Total Bilirubin 0.3 - 1.2 mg/dL 1.4  0.6  0.9       Latest Ref Rng & Units 07/13/2022    1:50 PM 05/25/2022    3:57 PM 05/25/2022    1:57 PM   CBC  WBC 4.0 - 10.5 K/uL 5.0   6.1   Hemoglobin 13.0 - 17.0 g/dL 14.9  14.3  15.1   Hematocrit 39.0 - 52.0 % 42.3  42.0  43.0   Platelets 150 -  400 K/uL 160   142    Lab Results  Component Value Date   TSH 4.500 03/29/2017   Lab Results  Component Value Date   HGBA1C 5.4 12/25/2013   Lipid Panel     Component Value Date/Time   CHOL 164 02/25/2020 0842   TRIG 131 02/25/2020 0842   HDL 41 02/25/2020 0842   CHOLHDL 4.0 02/25/2020 0842   LDLCALC 100 (H) 02/25/2020 4562     RADIOLOGY: No results found.  IMPRESSION:  1. OSA on CPAP   2. Persistent atrial fibrillation ([place])   3. Chronic anticoagulation   4. Moderate aortic stenosis     ASSESSMENT AND PLAN: Thomas [name] Is an 36 -year-old male who has a history of permanent atrial fibrillation, previous documentation of chronic diastolic heart failure, hypertension, chronic kidney disease, aortic stenosis as well as a history of multiple myeloma, prostate adenocarcinoma and pheochromocytoma status post left adrenal gland resection.  He has been followed closely by Dr. Tamala Julian for cardiology care and will be transitioning to Dr. Burt Knack to continue following his moderately severe aortic valve stenosis.  Thomas [name] has been documented to have severe obstructive sleep apnea and has been on CPAP therapy since March 26, 2014.  He has lost significant weight and is now at 160 pounds compared to a high of 196.  He has permanent atrial fibrillation with controlled rate.  He is anticoagulated on low-dose Eliquis.  His machine is 3G and not 5G compatible and as result is no longer wireless.  His CPAP unit is now 82 -1/86 years old.  He qualifies for a new machine.  We will need to change his DME company most likely to [facility].  [facility] did not take his insurance when transfer was attempted to them from [facility].  We will schedule him to receive a new ResMed AirSense 11 CPAP auto unit with his new DME provider.  I will change his  settings from 10-16 to 10 to 18 cm of water.  We provided him with a new ResMed F30i mask today.  I will see him in 4 to 5 months for follow-up evaluation and within 90 days of receiving his new machine.  Troy Sine, MD, [facility]  10/14/2022 6:45 PM

## 2022-10-14 ENCOUNTER — Encounter: Payer: Self-pay | Admitting: Cardiovascular Disease

## 2022-10-18 ENCOUNTER — Telehealth: Payer: Self-pay | Admitting: *Deleted

## 2022-10-18 NOTE — Telephone Encounter (Signed)
Order for Replacement CPAP machine faxed to [facility].

## 2022-10-28 DIAGNOSIS — G4733 Obstructive sleep apnea (adult) (pediatric): Secondary | ICD-10-CM | POA: Diagnosis not present

## 2022-10-28 DIAGNOSIS — I10 Essential (primary) hypertension: Secondary | ICD-10-CM | POA: Diagnosis not present

## 2022-10-28 DIAGNOSIS — I4819 Other persistent atrial fibrillation: Secondary | ICD-10-CM | POA: Diagnosis not present

## 2022-11-01 ENCOUNTER — Telehealth: Payer: Self-pay | Admitting: Cardiovascular Disease

## 2022-11-01 NOTE — Telephone Encounter (Signed)
FYI--Patient just wanted to let Dr. Claiborne Billings know that his sleep equipment arrived yesterday afternoon.

## 2022-11-28 DIAGNOSIS — G4733 Obstructive sleep apnea (adult) (pediatric): Secondary | ICD-10-CM | POA: Diagnosis not present

## 2022-11-28 DIAGNOSIS — I10 Essential (primary) hypertension: Secondary | ICD-10-CM | POA: Diagnosis not present

## 2022-11-28 DIAGNOSIS — I4819 Other persistent atrial fibrillation: Secondary | ICD-10-CM | POA: Diagnosis not present

## 2022-12-17 ENCOUNTER — Other Ambulatory Visit: Payer: Self-pay | Admitting: Radiation Oncology

## 2022-12-26 ENCOUNTER — Ambulatory Visit: Payer: Medicare Other | Attending: Cardiovascular Disease | Admitting: Cardiovascular Disease

## 2022-12-26 ENCOUNTER — Encounter: Payer: Self-pay | Admitting: Cardiovascular Disease

## 2022-12-26 ENCOUNTER — Telehealth: Payer: Self-pay | Admitting: *Deleted

## 2022-12-26 DIAGNOSIS — G4733 Obstructive sleep apnea (adult) (pediatric): Secondary | ICD-10-CM | POA: Diagnosis not present

## 2022-12-26 DIAGNOSIS — I4819 Other persistent atrial fibrillation: Secondary | ICD-10-CM | POA: Diagnosis not present

## 2022-12-26 DIAGNOSIS — I10 Essential (primary) hypertension: Secondary | ICD-10-CM | POA: Diagnosis not present

## 2022-12-26 DIAGNOSIS — I350 Nonrheumatic aortic (valve) stenosis: Secondary | ICD-10-CM

## 2022-12-26 DIAGNOSIS — Z7901 Long term (current) use of anticoagulants: Secondary | ICD-10-CM | POA: Diagnosis not present

## 2022-12-26 NOTE — Progress Notes (Signed)
Patient ID: Thomas [name], male   DOB: [birth year], 88 y.o.   MRN: [id number]       HPI: LENN SRINIVASAN, is a 88 y.o. male who is followed by Dr. Tamala Julian for cardiology care.  I initially saw him  in September 2015 for sleep clinic evaluation following initiation of CPAP therapy.  I last saw him on October 13, 2022. He presents got a follow-up sleep evaluation after receiving a new CPAP machine.  Mr Yanik has a history of paroxysmal atrial fibrillation and  has been maintaining sinus rhythm with sinus bradycardia.  He has a history of hypertension, hyperlipidemia, documented bilateral renal cysts, and mild aortic valve stenosis, and multiple myeloma.  Due to concerns for obstructive sleep apnea, particularly with his atrial fibrillation, history he was referred by Dr. Tamala Julian for a diagnostic polysomnogram in 2015.   His diagnostic polysomnogram was done at the [facility] on 02/05/2014 revealed severe obstructive sleep apnea with an AHI of 45.7 per hour.  He had oxygen desaturation to 86% and there was evidence for moderate snoring.  There were frequent periodic limb movements with an index of 36 per hour with 20.3 per hour to arousal.  He subsequently was referred for a CPAP titration in a CPAP pressure of 9 cm was recommended.  Of note, there was significant improvement in his periodic leg movements with CPAP therapy.  A download from 05/24/2014 to 06/22/2014 showed excellent compliance with 100% days of usage.  He had 97% days with use greater than 4 hours and was meeting Medicare compliance standards.  He has an AirSense 10 AutoSet unit and is at a set pressure of 9 cm.  His AHI is now excellent at 1.2.  There is no leak.  He has a Special educational needs teacher, fullface mask (medium).  When I saw him in February 2018 he was 100% compliant with CPAP.  Download  from 10/23/2016 through 11/21/2016, which confirmed 100% compliance.  CPAP was set at 9 cm set pressure; AHI was 1.0.  He was  using a full facemask.  His nocturia had significantly improved with CPAP treatment. His Epworth Sleepiness Scale score is improved and was 6 compared to previously being elevated  Epworth Sleepiness Scale: Situation   Chance of Dozing/Sleeping (0 = never , 1 = slight chance , 2 = moderate chance , 3 = high chance )   sitting and reading 2   watching TV 1   sitting inactive in a public place 0   being a passenger in a motor vehicle for an hour or more 0   lying down in the afternoon 3   sitting and talking to someone 0   sitting quietly after lunch (no alcohol) 0   while stopped for a few minutes in traffic as the driver 0   Total Score  6   He continues to be followed by Dr. Tamala Julian for his paroxysmal atrial fibrillation, chronic diastolic heart failure, hypertension, chronic kidney disease and mild aortic stenosis.  Remotely he was not on anticoagulation secondary to  recurrent GU bleeding, but currently is on low-dose Eliquis.  He has undergone chemotherapy for multiple myeloma as well as treatment for prostate CA.  When I saw him in October 2019 he had  lost 49 pounds with his recent illnesses.  His primary physician question whether or not he still needed to use CPAP therapy.  In the office today I calculated a new Epworth Sleepiness Scale score and this endorsed  at 82 and was consistent with mild daytime sleepiness.  He has a ResMed air sense 10 AutoSet unit and has been set at a 9 cm pressure.  He was using an old air fit F10 full facemask and has had difficulty with pressure on the bridge of his nose.  A download from June 25, 2018 through July 24, 2018 shows 100% compliance;  averaging 6 hours and 26 minutes of CPAP use.  AHI is 3.1.  There was no leak.  During that evaluation I recommended he switch to the DreamWear technology full facemask in light of his difficulty with the older facemask on the bridge of his nose.  We discussed the importance of increasing sleep duration since his  CPAP use was just under 6 hours and 30 minutes.  I saw him in November 2020 and he continued to be in  permanent atrial fibrillation and was on diltiazem CD 120 mg and Eliquis 2.5 mg twice a day.  He last saw Dr. Tamala Julian in February 2020.  A download was obtained today from July 13, 2019 through August 11, 2019.  Usage days 87%.  He has not been able to sleep for long duration since his wife had fallen and had broken 4 vertebrae and has had significant difficulty resulting from this.  As result he has been caring for her and oftentimes gets up during the night.  On his  download average use was 5 hours per night.  At his 9 cm water pressure, AHI however continued to be excellent at 1.2.  He typically goes to bed at 11:30 and wakes up at 630.  He likes the current air fit F 30 medium size mask that he is using that has a Pharmacologist compared to his old full facemask.  However, at times the tubing which from the front sometimes gets in the way when he tries to turn from side to side.  During that evaluation I recommended he switch to the ResMed air fit F 30i mask where the tubing arises from the crown of the head and does not interfere with him moving side to side.  At his August 24, 2020 evaluation he remained stable.   Previously he had significant weight loss as result of his illnesses.  But over the past year has had weight gain from 154 up to 173 pounds.  He admits to continuing to feel well with CPAP therapy.  He is unaware of breakthrough snoring.  A download was obtained from October 21 through August 21, 2020.  He is meeting compliance standards.  Average use is 6 hours and 11 minutes.  At a 9 cm set pressure, AHI is 4.2.  There is no mask leak.  An Epworth Sleepiness Scale score was recalculated in the office today and this endorsed at 5 arguing against residual daytime sleepiness.   I saw him on August 31, 2021. He has continued to be followed by Dr. Tamala Julian.  He underwent an  echo Doppler study in August 2022 which showed EF of 55 to 60% with mild LVH.  There was an increase in there is aortic valve gradient with a peak gradient of 38 and a mean gradient of 19.  There was mild aortic root dilatation.  From a sleep perspective, he continues to use CPAP therapy.  Due to this 5G upgrade, his machine is no longer wireless.  [facility] is his DME company.  We were able to obtain data today with a download  from October 31 through August 31, 2021.  Usage is excellent at 100%.  There is no mask leak.  His pressures are set at a range of 8 to 14 cm of water and his 95th percentile pressure is 13.6 with a maximum average pressure of 13.9.  Average use however was just shy of 6 hours at 5 hours and 56 minutes.  He states he typically goes to bed between 11:15 and 11:30 and can wake up anywhere between 6 and 7:30 AM.  He has issues with low back pain and the bad right knee and walks with a cane.   He was recently seen by Dr. Tamala Julian on September 06, 2022 before his retirement.  With his progressive aortic valve stenosis documented on his last echo in October 2023 now felt to be moderately severe he will be following up cardiology with Dr. Burt Knack.  I last saw him on October 13, 2022.  From a sleep perspective, he needs to be referred to a new DME company since [facility] is no longer providing CPAP care.  His machine set up date is March 26, 2014.  He brought his machine to the office today so that we could manually download his device since it is no longer wireless with 5G upgrade.  His compliance continues to be excellent with 100% use with average use of 6 hours and 49 minutes.  His device is set at a pressure range of 10 to 16 cm.  His 95th percentile pressure is 15.5 with maximum average pressure 15.9.  AHI is excellent at 1.9.  He believes he is sleeping well.  He is in need for new machine.  An Epworth Sleepiness Scale score was calculated in the office today and this  endorsed at 8 arguing against residual daytime sleepiness.    He received a new ResMed AirSense 11 AutoSet CPAP device from October 28, 2022 with [employer] as his new DME company.  A download from January 26 to November 26, 2022 showed 100% compliance with average use at 7 hours and 29 minutes.  His CPAP is set at a pressure range of 10 to 18 cm and 95th percentile pressure is 12.1 maximum average pressure 13.9.  AHI is excellent at 1.5.  A subsequent download from February 22 through December 23, 2022 showed 100% use with average use at 7 hours and 34 minutes.  AHI was 2.2/h with 95th percentile pressure 11.8 maximum average pressure 13.3.  Clinically he is sleeping well.  He is unaware of breakthrough snoring.  He continues to be on Eliquis 2.5 mg twice a day with his history of PAF.  He is on HCTZ 12.5 mg daily.  He presents for reevaluation.  Past Medical History:  Diagnosis Date   BPH (benign prostatic hypertrophy)    Chronic diastolic heart failure ([place]) 12/27/2013   Chronic kidney disease    Hx of bilateral renal cysts.   Encounter for antineoplastic chemotherapy 02/20/2018   Glaucoma    Heart murmur    History of atrial fibrillation 03/09/09   Hx of epiglottitis    2011   Hypercholesterolemia    Hypertension    Long term current use of amiodarone 11/07/2013   Amiodarone    Mild aortic stenosis    OSA on CPAP 06/26/2014   Paroxysmal atrial fibrillation ([place]) 11/07/2013   Prostate cancer ([place]) 7/12   T1C Ad Ca  s/p TURP, observation only   Renal insufficiency 09/06/2011   Sinus node dysfunction ([place]) 08/14/2014  Sleep apnea    Smoldering multiple myeloma     Past Surgical History:  Procedure Laterality Date   ADRENALECTOMY  02/12/11   Left, for Pheochromocytoma   CARDIOVERSION N/A 12/26/2013   Procedure: CARDIOVERSION;  Surgeon: [name], MD;  Location: [facility];  Service: Cardiovascular;  Laterality: N/A;   CHOLECYSTECTOMY  02/12/11   GASTRECTOMY  1984   S/P gastrectomy for ulcer    KNEE SURGERY  1971   S/P Right knee surgery   TEE WITHOUT CARDIOVERSION N/A 12/26/2013   Procedure: TRANSESOPHAGEAL ECHOCARDIOGRAM (TEE);  Surgeon: [name], MD;  Location: [facility];  Service: Cardiovascular;  Laterality: N/A;   TRANSURETHRAL RESECTION OF PROSTATE  7/12    Allergies  Allergen Reactions   Benadryl [Diphenhydramine Hcl] Other (See Comments)    Bad dreams -  Hallucinations.   Ceftriaxone Sodium Hives   Contrast Media [Iodinated Contrast Media] Other (See Comments)    Poor kidney function   Nsaids Other (See Comments)    REACTION: BLEEDING ULCER   Penicillins Hives   Farxiga [Dapagliflozin] Rash   Codeine     Other reaction(s): loopy   Ibuprofen     Other reaction(s): hives   Levaquin [Levofloxacin]    Oxybutynin Other (See Comments)    Lightheaded, dizzy, unbalanced. "Disturbed my equilibrium."   Tramadol Hcl     Other reaction(s): dizzy/nauseated   Chlorhexidine Rash    Use alcohol for skin prep per patient request.    Furosemide Diarrhea, Itching and Rash    Current Outpatient Medications  Medication Sig Dispense Refill   acetaminophen (TYLENOL) 500 MG tablet Take 1,000 mg by mouth as needed.      apixaban (ELIQUIS) 2.5 MG TABS tablet Take 1 tablet (2.5 mg total) by mouth 2 (two) times daily. 180 tablet 3   Cholecalciferol (VITAMIN D3) 50 MCG (2000 UT) capsule Take 1 capsule by mouth daily.      dorzolamide-timolol (COSOPT) 22.3-6.8 MG/ML ophthalmic solution 1 drop 2 (two) times daily.     latanoprost (XALATAN) 0.005 % ophthalmic solution INSTILL 1 DROP INTO BOTH EYES EVERY DAY AT NIGHT  6   hydrochlorothiazide (MICROZIDE) 12.5 MG capsule Take 1 capsule (12.5 mg total) by mouth daily. (Patient not taking: Reported on 10/13/2022) 90 capsule 1   No current facility-administered medications for this visit.    Social History   Socioeconomic History   Marital status: Married    Spouse name: Not on file   Number of children: 3   Years of education:  Not on file   Highest education level: Not on file  Occupational History    Employer: RETIRED  Tobacco Use   Smoking status: Never   Smokeless tobacco: Never  Vaping Use   Vaping Use: Never used  Substance and Sexual Activity   Alcohol use: No   Drug use: No   Sexual activity: Not Currently  Other Topics Concern   Not on file  Social History Narrative   Not on file   Social Determinants of Health   Financial Resource Strain: Not on file  Food Insecurity: Not on file  Transportation Needs: Not on file  Physical Activity: Not on file  Stress: Not on file  Social Connections: Not on file  Intimate Partner Violence: Not on file     ROS General: Negative; No fevers, chills, or night sweats HEENT: Negative; No changes in vision or hearing, sinus congestion, difficulty swallowing Pulmonary: Negative; No cough, wheezing, shortness of breath, hemoptysis Cardiovascular: Positive for  permanent atrial fibrillation; aortic stenosis GI: Negative; No nausea, vomiting, diarrhea, or abdominal pain GU: Negative; No dysuria, hematuria, or difficulty voiding Musculoskeletal: Back pain, walks with a cane Hematologic: Positive for multiple myeloma; and prostate CA Endocrine: Negative; no heat/cold intolerance Neuro: Negative; no changes in balance, headaches Skin: Negative; No rashes or skin lesions Psychiatric: Negative; No behavioral problems, depression Sleep: Positive for OSA on CPAP; No daytime sleepiness, hypersomnolence, bruxism, restless legs, hypnogognic hallucinations, no cataplexy   Physical Exam BP 130/70 (BP Location: Left Arm, Patient Position: Sitting, Cuff Size: Normal)   Pulse 89   Ht 5\' 4"  (1.626 m)   Wt 167 lb (75.8 kg)   BMI 28.67 kg/m    Repeat blood pressure by me 120/70  Wt Readings from Last 3 Encounters:  12/26/22 167 lb (75.8 kg)  10/13/22 160 lb 9.6 oz (72.8 kg)  09/06/22 153 lb 12.8 oz (69.8 kg)    General: Alert, oriented, no distress.  Skin: normal  turgor, no rashes, warm and dry HEENT: Normocephalic, atraumatic. Pupils equal round and reactive to light; sclera anicteric; extraocular muscles intact;  Nose without nasal septal hypertrophy Mouth/Parynx benign; Mallinpatti scale 3 Neck: No JVD, no carotid bruits; normal carotid upstroke Lungs: clear to ausculatation and percussion; no wheezing or rales Chest wall: without tenderness to palpitation Heart: PMI not displaced, RRR, s1 s2 normal, 2/6 systolic murmur in the aortic area compatible with aortic stenosis, no diastolic murmur, no rubs, gallops, thrills, or heaves Abdomen: soft, nontender; no hepatosplenomehaly, BS+; abdominal aorta nontender and not dilated by palpation. Back: no CVA tenderness Pulses 2+ Musculoskeletal: full range of motion, normal strength, no joint deformities Extremities: no clubbing cyanosis or edema, Homan's sign negative  Neurologic: grossly nonfocal; Cranial nerves grossly wnl Psychologic: Normal mood and affect   December 26, 2022 ECG (independently read by me): Atrial fibrillation, PVC  I personally reviewed the ECG from September 06, 2022 which shows atrial fibrillation at 60 bpm.  Nonspecific ST abnormality  August 31, 2021 ECG (independently read by me):  Atrial fibrillation at 78  August 24, 2020 ECG (independently read by me): Atrial fibrillation at 63; QTc 417 msec    November 2020 ECG (independently read by me): Atrial fibrillation at 78; QTc 414 msec  October 2019 ECG (independently read by me): Atrial fibrillation at 84 bpm.  QTc interval 460 ms.  February 2018 ECG (independently read by me): Sinus bradycardia at 59 bpm with first-degree AV block (PR interval 21 6/22).  QTc interval 451 ms.  LABS:     Latest Ref Rng & Units 07/13/2022    1:50 PM 05/25/2022    3:57 PM 05/25/2022    1:57 PM  BMP  Glucose 70 - 99 mg/dL 118   133   BUN 8 - 23 mg/dL 25   22   Creatinine 0.61 - 1.24 mg/dL 1.48   1.80   Sodium 135 - 145 mmol/L 135  135  131    Potassium 3.5 - 5.1 mmol/L 3.7  4.1  3.7   Chloride 98 - 111 mmol/L 102   99   CO2 22 - 32 mmol/L 27   23   Calcium 8.9 - 10.3 mg/dL 10.1   9.6       Latest Ref Rng & Units 07/13/2022    1:50 PM 01/11/2022   11:04 AM 09/15/2021   10:53 AM  Hepatic Function  Total Protein 6.5 - 8.1 g/dL 6.8  6.5  6.9   Albumin 3.5 - 5.0 g/dL  4.1  4.0  4.0   AST 15 - 41 U/L 24  16  19    ALT 0 - 44 U/L 21  10  13    Alk Phosphatase 38 - 126 U/L 68  91  96   Total Bilirubin 0.3 - 1.2 mg/dL 1.4  0.6  0.9       Latest Ref Rng & Units 07/13/2022    1:50 PM 05/25/2022    3:57 PM 05/25/2022    1:57 PM  CBC  WBC 4.0 - 10.5 K/uL 5.0   6.1   Hemoglobin 13.0 - 17.0 g/dL 14.9  14.3  15.1   Hematocrit 39.0 - 52.0 % 42.3  42.0  43.0   Platelets 150 - 400 K/uL 160   142    Lab Results  Component Value Date   TSH 4.500 03/29/2017   Lab Results  Component Value Date   HGBA1C 5.4 12/25/2013   Lipid Panel     Component Value Date/Time   CHOL 164 02/25/2020 0842   TRIG 131 02/25/2020 0842   HDL 41 02/25/2020 0842   CHOLHDL 4.0 02/25/2020 0842   LDLCALC 100 (H) 02/25/2020 [id number]     RADIOLOGY: No results found.  IMPRESSION:  1. OSA on CPAP   2. Persistent atrial fibrillation ([place])   3. Moderate aortic stenosis   4. Essential hypertension   5. Chronic anticoagulation    ASSESSMENT AND PLAN: Mr. Meliton Rattan Is an 40 -year-old male who has a history of permanent atrial fibrillation, previous documentation of chronic diastolic heart failure, hypertension, chronic kidney disease, aortic stenosis as well as a history of multiple myeloma, prostate adenocarcinoma and pheochromocytoma status post left adrenal gland resection.  He has been followed closely by Dr. Tamala Julian for cardiology care and will be transitioning to Dr. Burt Knack to continue following his moderately severe aortic valve stenosis.  Mr. Mangat  has been documented to have severe obstructive sleep apnea and has been on CPAP therapy since March 26, 2014.  He has lost significant weight and is now at 160 pounds compared to a high of 196.  He has permanent atrial fibrillation with controlled rate.  He is anticoagulated on low-dose Eliquis.  At his office visit with me on October 13, 2022 he qualified for a new ResMed AirSense 11 AutoSet unit since his prior machine was 29-1/86 years old and was no longer wireless.  He received a new ResMed AirSense 11 AutoSet unit on October 28, 2022.  His new DME provider is [employer].  He is meeting compliance standards.  AHI is excellent at 1.5 and 2.2 on serial downloads with average use of approximately 7-1/2 hours per night.  He typically goes to bed between 1030 and 11 PM and wakes up at 7 AM.  He is unaware of any breakthrough snoring.  He denies significant nocturia.  His sleep is restorative.  An Epworth Sleepiness Scale score was calculated in the office today and this endorsed at 10.  His blood pressure today is stable on his current regimen he continues to be on anticoagulation.  He will be establishing care with Dr. Burt Knack who will follow his aortic stenosis.  I will see him in 1 year for reevaluation.   Troy Sine, MD, [facility], [place], American Board of Sleep Medicine  01/02/2023 10:19 AM

## 2022-12-26 NOTE — Telephone Encounter (Signed)
Open error 

## 2022-12-26 NOTE — Patient Instructions (Signed)
Medication Instructions:  Not needed  *If you need a refill on your cardiac medications before your next appointment, please call your pharmacy*   Lab Work: Not needed    Testing/Procedures: Not needed   Follow-Up: At [facility], you and your health needs are our priority.  As part of our continuing mission to provide you with exceptional heart care, we have created designated Provider Care Teams.  These Care Teams include your primary Cardiologist (physician) and Advanced Practice Providers (APPs -  Physician Assistants and Nurse Practitioners) who all work together to provide you with the care you need, when you need it.     Your next appointment:   12 month(s) sleep clinic  The format for your next appointment:   In Person  Provider:   Dr Shelva Majestic     Other Instructions    No pressure changes were done to your C-PAP

## 2022-12-27 DIAGNOSIS — G4733 Obstructive sleep apnea (adult) (pediatric): Secondary | ICD-10-CM | POA: Diagnosis not present

## 2022-12-27 DIAGNOSIS — I4819 Other persistent atrial fibrillation: Secondary | ICD-10-CM | POA: Diagnosis not present

## 2022-12-27 DIAGNOSIS — I10 Essential (primary) hypertension: Secondary | ICD-10-CM | POA: Diagnosis not present

## 2023-01-02 ENCOUNTER — Encounter: Payer: Self-pay | Admitting: Cardiovascular Disease

## 2023-01-16 ENCOUNTER — Inpatient Hospital Stay: Payer: Medicare Other | Attending: Internal Medicine

## 2023-01-16 ENCOUNTER — Ambulatory Visit: Payer: Medicare Other | Admitting: Internal Medicine

## 2023-01-16 DIAGNOSIS — C61 Malignant neoplasm of prostate: Secondary | ICD-10-CM | POA: Diagnosis present

## 2023-01-16 DIAGNOSIS — M2550 Pain in unspecified joint: Secondary | ICD-10-CM | POA: Insufficient documentation

## 2023-01-16 DIAGNOSIS — Z885 Allergy status to narcotic agent status: Secondary | ICD-10-CM | POA: Insufficient documentation

## 2023-01-16 DIAGNOSIS — Z9049 Acquired absence of other specified parts of digestive tract: Secondary | ICD-10-CM | POA: Insufficient documentation

## 2023-01-16 DIAGNOSIS — C9001 Multiple myeloma in remission: Secondary | ICD-10-CM | POA: Diagnosis not present

## 2023-01-16 DIAGNOSIS — I130 Hypertensive heart and chronic kidney disease with heart failure and stage 1 through stage 4 chronic kidney disease, or unspecified chronic kidney disease: Secondary | ICD-10-CM | POA: Diagnosis not present

## 2023-01-16 DIAGNOSIS — Z7901 Long term (current) use of anticoagulants: Secondary | ICD-10-CM | POA: Diagnosis not present

## 2023-01-16 DIAGNOSIS — Z881 Allergy status to other antibiotic agents status: Secondary | ICD-10-CM | POA: Insufficient documentation

## 2023-01-16 DIAGNOSIS — Z888 Allergy status to other drugs, medicaments and biological substances status: Secondary | ICD-10-CM | POA: Diagnosis not present

## 2023-01-16 DIAGNOSIS — Z7952 Long term (current) use of systemic steroids: Secondary | ICD-10-CM | POA: Insufficient documentation

## 2023-01-16 DIAGNOSIS — N1832 Chronic kidney disease, stage 3b: Secondary | ICD-10-CM | POA: Diagnosis not present

## 2023-01-16 DIAGNOSIS — C9000 Multiple myeloma not having achieved remission: Secondary | ICD-10-CM

## 2023-01-16 DIAGNOSIS — I5032 Chronic diastolic (congestive) heart failure: Secondary | ICD-10-CM | POA: Insufficient documentation

## 2023-01-16 DIAGNOSIS — Z9079 Acquired absence of other genital organ(s): Secondary | ICD-10-CM | POA: Insufficient documentation

## 2023-01-16 DIAGNOSIS — Z79899 Other long term (current) drug therapy: Secondary | ICD-10-CM | POA: Insufficient documentation

## 2023-01-16 DIAGNOSIS — Z880 Allergy status to penicillin: Secondary | ICD-10-CM | POA: Insufficient documentation

## 2023-01-16 DIAGNOSIS — Z886 Allergy status to analgesic agent status: Secondary | ICD-10-CM | POA: Diagnosis not present

## 2023-01-16 DIAGNOSIS — Z7969 Long term (current) use of other immunomodulators and immunosuppressants: Secondary | ICD-10-CM | POA: Insufficient documentation

## 2023-01-16 LAB — CBC WITH DIFFERENTIAL (CANCER CENTER ONLY)
Abs Immature Granulocytes: 0.07 10*3/uL (ref 0.00–0.07)
Basophils Absolute: 0 10*3/uL (ref 0.0–0.1)
Basophils Relative: 0 %
Eosinophils Absolute: 0.1 10*3/uL (ref 0.0–0.5)
Eosinophils Relative: 2 %
HCT: 43.9 % (ref 39.0–52.0)
Hemoglobin: 15.3 g/dL (ref 13.0–17.0)
Immature Granulocytes: 1 %
Lymphocytes Relative: 21 %
Lymphs Abs: 1.1 10*3/uL (ref 0.7–4.0)
MCH: 31.8 pg (ref 26.0–34.0)
MCHC: 34.9 g/dL (ref 30.0–36.0)
MCV: 91.3 fL (ref 80.0–100.0)
Monocytes Absolute: 0.6 10*3/uL (ref 0.1–1.0)
Monocytes Relative: 12 %
Neutro Abs: 3.4 10*3/uL (ref 1.7–7.7)
Neutrophils Relative %: 64 %
Platelet Count: 158 10*3/uL (ref 150–400)
RBC: 4.81 MIL/uL (ref 4.22–5.81)
RDW: 14.9 % (ref 11.5–15.5)
WBC Count: 5.3 10*3/uL (ref 4.0–10.5)
nRBC: 0 % (ref 0.0–0.2)

## 2023-01-16 LAB — CMP (CANCER CENTER ONLY)
ALT: 13 U/L (ref 0–44)
AST: 19 U/L (ref 15–41)
Albumin: 4.2 g/dL (ref 3.5–5.0)
Alkaline Phosphatase: 116 U/L (ref 38–126)
Anion gap: 3 — ABNORMAL LOW (ref 5–15)
BUN: 25 mg/dL — ABNORMAL HIGH (ref 8–23)
CO2: 27 mmol/L (ref 22–32)
Calcium: 10.6 mg/dL — ABNORMAL HIGH (ref 8.9–10.3)
Chloride: 109 mmol/L (ref 98–111)
Creatinine: 1.51 mg/dL — ABNORMAL HIGH (ref 0.61–1.24)
GFR, Estimated: 45 mL/min — ABNORMAL LOW (ref >60)
Glucose, Bld: 86 mg/dL (ref 70–99)
Potassium: 4.7 mmol/L (ref 3.5–5.1)
Sodium: 139 mmol/L (ref 135–145)
Total Bilirubin: 0.7 mg/dL (ref 0.3–1.2)
Total Protein: 7.1 g/dL (ref 6.5–8.1)

## 2023-01-16 LAB — LACTATE DEHYDROGENASE: LDH: 180 U/L (ref 98–192)

## 2023-01-17 LAB — KAPPA/LAMBDA LIGHT CHAINS
Kappa free light chain: 123.3 mg/L — ABNORMAL HIGH (ref 3.3–19.4)
Kappa, lambda light chain ratio: 8.01 — ABNORMAL HIGH (ref 0.26–1.65)
Lambda free light chains: 15.4 mg/L (ref 5.7–26.3)

## 2023-01-17 LAB — BETA 2 MICROGLOBULIN, SERUM: Beta-2 Microglobulin: 3 mg/L — ABNORMAL HIGH (ref 0.6–2.4)

## 2023-01-18 LAB — IGG, IGA, IGM
IgA: 75 mg/dL (ref 61–437)
IgG (Immunoglobin G), Serum: 1405 mg/dL (ref 603–1613)
IgM (Immunoglobulin M), Srm: 50 mg/dL (ref 15–143)

## 2023-01-19 ENCOUNTER — Ambulatory Visit: Payer: Medicare Other | Attending: Cardiovascular Disease | Admitting: Cardiovascular Disease

## 2023-01-19 ENCOUNTER — Encounter: Payer: Self-pay | Admitting: Cardiovascular Disease

## 2023-01-19 VITALS — BP 126/64 | HR 97 | Ht 64.5 in | Wt 164.0 lb

## 2023-01-19 DIAGNOSIS — Z7901 Long term (current) use of anticoagulants: Secondary | ICD-10-CM | POA: Diagnosis not present

## 2023-01-19 DIAGNOSIS — N1832 Chronic kidney disease, stage 3b: Secondary | ICD-10-CM

## 2023-01-19 DIAGNOSIS — I5032 Chronic diastolic (congestive) heart failure: Secondary | ICD-10-CM | POA: Diagnosis not present

## 2023-01-19 DIAGNOSIS — I350 Nonrheumatic aortic (valve) stenosis: Secondary | ICD-10-CM

## 2023-01-19 DIAGNOSIS — I480 Paroxysmal atrial fibrillation: Secondary | ICD-10-CM

## 2023-01-19 NOTE — Progress Notes (Signed)
Cardiology Office Note:    Date:  01/19/2023   ID:  Thomas [name], DOB [birth year], MRN [id number]  PCP:  Thomas Montana, MD    [facility] Providers Cardiologist:  Thomas Bollman, MD Sleep Medicine:  Thomas Guadalajara, MD     Referring MD: Thomas Montana, MD   Chief Complaint  Patient presents with   Aortic Stenosis    History of Present Illness:    Thomas [name] is a 88 y.o. male presenting for follow-up evaluation, previously followed by Dr. Katrinka [name].  I will be assuming his care in Dr. Michaelle [name] retirement.  The patient has a history of permanent atrial fibrillation, chronic diastolic heart failure, hypertension, chronic kidney disease, recurrent GU bleeding, and moderately severe aortic stenosis.  Medical problems include metastatic prostate cancer and indolent multiple myeloma.  The patient is here with his wife and daughter today.  States that he has been getting along pretty well.  Since he was last seen by Dr. Katrinka [name], digoxin was discontinued.  They all feel like he is doing better since then.  He has had no recurrent lightheadedness or near syncope.  He denies chest pain, chest pressure, or shortness of breath with activity.  His activity is fairly limited by back and knee problems.  He ambulates with a cane.  He feels that he is doing well.  Using CPAP for sleep apnea has helped him a lot.  He has not had any gastrointestinal bleeding on low-dose apixaban.  Past Medical History:  Diagnosis Date   BPH (benign prostatic hypertrophy)    Chronic diastolic heart failure 12/27/2013   Chronic kidney disease    Hx of bilateral renal cysts.   Encounter for antineoplastic chemotherapy 02/20/2018   Glaucoma    Heart murmur    History of atrial fibrillation 03/09/09   Hx of epiglottitis    2011   Hypercholesterolemia    Hypertension    Long term current use of amiodarone 11/07/2013   Amiodarone    Mild aortic stenosis    OSA on CPAP 06/26/2014   Paroxysmal atrial fibrillation  11/07/2013   Prostate cancer 7/12   T1C Ad Ca  s/p TURP, observation only   Renal insufficiency 09/06/2011   Sinus node dysfunction 08/14/2014   Sleep apnea    Smoldering multiple myeloma     Past Surgical History:  Procedure Laterality Date   ADRENALECTOMY  02/12/11   Left, for Pheochromocytoma   CARDIOVERSION N/A 12/26/2013   Procedure: CARDIOVERSION;  Surgeon: Pricilla Riffle, MD;  Location: [facility] ENDOSCOPY;  Service: Cardiovascular;  Laterality: N/A;   CHOLECYSTECTOMY  02/12/11   GASTRECTOMY  1984   S/P gastrectomy for ulcer   KNEE SURGERY  1971   S/P Right knee surgery   TEE WITHOUT CARDIOVERSION N/A 12/26/2013   Procedure: TRANSESOPHAGEAL ECHOCARDIOGRAM (TEE);  Surgeon: Pricilla Riffle, MD;  Location: [facility] ENDOSCOPY;  Service: Cardiovascular;  Laterality: N/A;   TRANSURETHRAL RESECTION OF PROSTATE  7/12    Current Medications: Current Meds  Medication Sig   acetaminophen (TYLENOL) 500 MG tablet Take 1,000 mg by mouth as needed.    apixaban (ELIQUIS) 2.5 MG TABS tablet Take 1 tablet (2.5 mg total) by mouth 2 (two) times daily.   Cholecalciferol (VITAMIN D3) 50 MCG (2000 UT) capsule Take 1 capsule by mouth daily.    dorzolamide (TRUSOPT) 2 % ophthalmic solution 1 drop 2 (two) times daily.   dorzolamide-timolol (COSOPT) 22.3-6.8 MG/ML ophthalmic solution 1 drop 2 (two) times daily.   hydrochlorothiazide (MICROZIDE)  12.5 MG capsule Take 1 capsule (12.5 mg total) by mouth daily.   latanoprost (XALATAN) 0.005 % ophthalmic solution INSTILL 1 DROP INTO BOTH EYES EVERY DAY AT NIGHT   timolol (TIMOPTIC) 0.5 % ophthalmic solution 1 drop 2 (two) times daily.     Allergies:   Benadryl [diphenhydramine hcl], Ceftriaxone sodium, Contrast media [iodinated contrast media], Diphenhydramine hcl, Nsaids, Penicillins, Farxiga [dapagliflozin], Codeine, Ibuprofen, Levaquin [levofloxacin], Oxybutynin, Tramadol hcl, Chlorhexidine, and Furosemide   Social History   Socioeconomic History   Marital status:  Married    Spouse name: Not on file   Number of children: 3   Years of education: Not on file   Highest education level: Not on file  Occupational History    Employer: RETIRED  Tobacco Use   Smoking status: Never   Smokeless tobacco: Never  Vaping Use   Vaping Use: Never used  Substance and Sexual Activity   Alcohol use: No   Drug use: No   Sexual activity: Not Currently  Other Topics Concern   Not on file  Social History Narrative   Not on file   Social Determinants of Health   Financial Resource Strain: Not on file  Food Insecurity: Not on file  Transportation Needs: Not on file  Physical Activity: Not on file  Stress: Not on file  Social Connections: Not on file     Family History: The patient's family history includes Kidney Stones in his brother; Liver disease in his sister, sister, and sister; Melanoma in his brother; Polymyositis in his mother; Prostate cancer in his brother and father.  ROS:   Please see the history of present illness.    All other systems reviewed and are negative.  EKGs/Labs/Other Studies Reviewed:    The following studies were reviewed today: Cardiac Studies & Procedures       ECHOCARDIOGRAM  ECHOCARDIOGRAM COMPLETE 07/04/2022  Narrative ECHOCARDIOGRAM REPORT    Patient Name:   Thomas [name] Date of Exam: 07/04/2022 Medical Rec #:  [id number]         Height:       64.0 in Accession #:    [id number]        Weight:       164.6 lb Date of Birth:  1935/03/20         BSA:          1.801 m Patient Age:    87 years          BP:           98/60 mmHg Patient Gender: M                 HR:           76 bpm. Exam Location:  [address]  Procedure: 2D Echo, Cardiac Doppler and Color Doppler  Indications:    I48.19 Atrial fibrillation  History:        Patient has prior history of Echocardiogram examinations, most recent 05/28/2021. CHF, Dilated aortic root, AS, Arrythmias:Atrial Fibrillation; Risk Factors:Hypertension  and Dyslipidemia.  Sonographer:    Samule Ohm RDCS Referring Phys: [phone] Thomas [name] [facility]  IMPRESSIONS   1. Left ventricular ejection fraction, by estimation, is 60 to 65%. The left ventricle has normal function. The left ventricle has no regional wall motion abnormalities. There is moderate asymmetric left ventricular hypertrophy of the basal and septal segments. Left ventricular diastolic parameters were normal. 2. Right ventricular systolic function is normal. The right ventricular size is normal. 3. Left  atrial size was moderately dilated. 4. Nodular calcificatio nof anterior leaflet . The mitral valve is abnormal. Mild mitral valve regurgitation. No evidence of mitral stenosis. 5. Gradients increased since prior echo mean 17.8-> 18.4 peak 29.8->35.8 DVI similar 0.26-> 0.25 and AVA similar still > 1 cm2 partial fusion of right and left cusps . The aortic valve is abnormal. There is severe calcifcation of the aortic valve. There is severe thickening of the aortic valve. Aortic valve regurgitation is moderate. Moderate to severe aortic valve stenosis. 6. Aortic dilatation noted. There is moderate dilatation of the aortic root, measuring 42 mm. There is moderate dilatation of the ascending aorta, measuring 41 mm. 7. The inferior vena cava is normal in size with greater than 50% respiratory variability, suggesting right atrial pressure of 3 mmHg.  FINDINGS Left Ventricle: Left ventricular ejection fraction, by estimation, is 60 to 65%. The left ventricle has normal function. The left ventricle has no regional wall motion abnormalities. The left ventricular internal cavity size was normal in size. There is moderate asymmetric left ventricular hypertrophy of the basal and septal segments. Left ventricular diastolic parameters were normal.  Right Ventricle: The right ventricular size is normal. No increase in right ventricular wall thickness. Right ventricular systolic function is  normal.  Left Atrium: Left atrial size was moderately dilated.  Right Atrium: Right atrial size was normal in size.  Pericardium: There is no evidence of pericardial effusion.  Mitral Valve: Nodular calcificatio nof anterior leaflet. The mitral valve is abnormal. There is mild thickening of the mitral valve leaflet(s). There is mild calcification of the mitral valve leaflet(s). Mild mitral valve regurgitation. No evidence of mitral valve stenosis.  Tricuspid Valve: The tricuspid valve is normal in structure. Tricuspid valve regurgitation is mild . No evidence of tricuspid stenosis.  Aortic Valve: Gradients increased since prior echo mean 17.8-> 18.4 peak 29.8->35.8 DVI similar 0.26-> 0.25 and AVA similar still > 1 cm2 partial fusion of right and left cusps. The aortic valve is abnormal. There is severe calcifcation of the aortic valve. There is severe thickening of the aortic valve. Aortic valve regurgitation is moderate. Aortic regurgitation PHT measures 369 msec. Moderate to severe aortic stenosis is present. Aortic valve mean gradient measures 18.4 mmHg. Aortic valve peak gradient measures 35.8 mmHg. Aortic valve area, by VTI measures 1.03 cm.  Pulmonic Valve: The pulmonic valve was normal in structure. Pulmonic valve regurgitation is trivial. No evidence of pulmonic stenosis.  Aorta: Aortic dilatation noted. There is moderate dilatation of the aortic root, measuring 42 mm. There is moderate dilatation of the ascending aorta, measuring 41 mm.  Venous: The inferior vena cava is normal in size with greater than 50% respiratory variability, suggesting right atrial pressure of 3 mmHg.  IAS/Shunts: No atrial level shunt detected by color flow Doppler.   LEFT VENTRICLE PLAX 2D LVIDd:         4.00 cm   Diastology LVIDs:         2.80 cm   LV e' medial:    8.56 cm/s LV PW:         1.20 cm   LV E/e' medial:  12.7 LV IVS:        1.60 cm   LV e' lateral:   13.33 cm/s LVOT diam:     2.30 cm    LV E/e' lateral: 8.2 LV SV:         65 LV SV Index:   36 LVOT Area:     4.15 cm  RIGHT VENTRICLE            IVC RVSP:           20.8 mmHg  IVC diam: 1.00 cm  LEFT ATRIUM             Index        RIGHT ATRIUM           Index LA diam:        4.50 cm 2.50 cm/m   RA Pressure: 3.00 mmHg LA Vol (A2C):   85.6 ml 47.53 ml/m  RA Area:     13.90 cm LA Vol (A4C):   71.4 ml 39.65 ml/m  RA Volume:   32.00 ml  17.77 ml/m LA Biplane Vol: 78.2 ml 43.42 ml/m AORTIC VALVE AV Area (Vmax):    1.07 cm AV Area (Vmean):   1.15 cm AV Area (VTI):     1.03 cm AV Vmax:           299.20 cm/s AV Vmean:          197.400 cm/s AV VTI:            0.633 m AV Peak Grad:      35.8 mmHg AV Mean Grad:      18.4 mmHg LVOT Vmax:         76.78 cm/s LVOT Vmean:        54.460 cm/s LVOT VTI:          0.156 m LVOT/AV VTI ratio: 0.25 AI PHT:            369 msec  AORTA Ao Root diam: 4.20 cm Ao Asc diam:  4.10 cm  MITRAL VALVE                TRICUSPID VALVE MV Area (PHT): 3.94 cm     TR Peak grad:   17.8 mmHg MV Decel Time: 192 msec     TR Vmax:        211.00 cm/s MV E velocity: 108.84 cm/s  Estimated RAP:  3.00 mmHg RVSP:           20.8 mmHg  SHUNTS Systemic VTI:  0.16 m Systemic Diam: 2.30 cm  Charlton Haws MD Electronically signed by Charlton Haws MD Signature Date/Time: 07/04/2022/12:30:19 PM    Final    MONITORS  LONG TERM MONITOR (3-14 DAYS) 10/03/2022  Narrative   Continuous atrial fibrillation / Atrial flutter with ave HR81 bpm and range 52-124 bpm   Isolated 7 beat WCT at 162 bpm.   PVC burden < 1%.   Patch Wear Time:  6 days and 23 hours (2023-12-13T08:47:09-499 to 2023-12-20T08:36:16-0500)  1 run of Ventricular Tachycardia occurred lasting 7 beats with a max rate of 162 bpm (avg 122 bpm). Atrial Flutter occurred continuously (100% burden), ranging from 52-124 bpm (avg of 81 bpm). Isolated VEs were rare (<1.0%, 3866), VE Couplets were rare (<1.0%, 67), and VE Triplets were rare  (<1.0%, 5). Ventricular Bigeminy and Trigeminy were present.            EKG:  EKG is not ordered today.    Recent Labs: 05/25/2022: B Natriuretic Peptide 229.4 01/16/2023: ALT 13; BUN 25; Creatinine 1.51; Hemoglobin 15.3; Platelet Count 158; Potassium 4.7; Sodium 139  Recent Lipid Panel    Component Value Date/Time   CHOL 164 02/25/2020 0842   TRIG 131 02/25/2020 0842   HDL 41 02/25/2020 0842   CHOLHDL 4.0 02/25/2020 0842   LDLCALC 100 (H) 02/25/2020 1610  Risk Assessment/Calculations:    CHA2DS2-VASc Score =     This indicates a  % annual risk of stroke. The patient's score is based upon:                Physical Exam:    VS:  BP 126/64   Pulse 97   Ht 5' 4.5" (1.638 m)   Wt 164 lb (74.4 kg)   SpO2 98%   BMI 27.72 kg/m     Wt Readings from Last 3 Encounters:  01/19/23 164 lb (74.4 kg)  12/26/22 167 lb (75.8 kg)  10/13/22 160 lb 9.6 oz (72.8 kg)     GEN: Pleasant elderly male in no acute distress HEENT: Normal NECK: No JVD; No carotid bruits LYMPHATICS: No lymphadenopathy CARDIAC: Irregularly irregular with a 3/6 harsh crescendo decrescendo murmur heard throughout RESPIRATORY:  Clear to auscultation without rales, wheezing or rhonchi  ABDOMEN: Soft, non-tender, non-distended MUSCULOSKELETAL: Trace lateral ankle edema; No deformity  SKIN: Warm and dry NEUROLOGIC:  Alert and oriented x 3 PSYCHIATRIC:  Normal affect   ASSESSMENT:    1. Moderate aortic stenosis   2. Paroxysmal atrial fibrillation   3. Chronic anticoagulation   4. Chronic diastolic heart failure   5. Stage 3b chronic kidney disease    PLAN:    In order of problems listed above:  I personally reviewed the patient's echo images.  He has moderately severe aortic stenosis and moderate aortic valve insufficiency.  He appears to be asymptomatic at this point.  He and his family seem to favor a somewhat conservative approach and I think this is appropriate with his lack of symptoms.  I am  going to update an echocardiogram to make sure that he is not developing LV dysfunction or some other firm indication for intervention.  Otherwise I will plan to see him back in 6 months with a repeat echo at that time.  We discussed the potential symptoms that could be related to aortic valve disease and he will watch out for any shortness of breath, chest discomfort, recurrent lightheadedness, or progressive fatigue.  Otherwise I will see him back in 6 months. Tolerating oral anticoagulation with apixaban.  No palpitations or other symptoms at present. No recurrent bleeding problems reported.  Seems to be doing well on low-dose apixaban.  Recent hemoglobin is 15.3. No evidence of volume overload on exam.  Continue current medical therapy. Recent creatinine is 1.51.  Continue current medicines.           Medication Adjustments/Labs and Tests Ordered: Current medicines are reviewed at length with the patient today.  Concerns regarding medicines are outlined above.  Orders Placed This Encounter  Procedures   ECHOCARDIOGRAM COMPLETE   No orders of the defined types were placed in this encounter.   Patient Instructions  Medication Instructions:  Your physician recommends that you continue on your current medications as directed. Please refer to the Current Medication list given to you today.  *If you need a refill on your cardiac medications before your next appointment, please call your pharmacy*   Lab Work: NONE If you have labs (blood work) drawn today and your tests are completely normal, you will receive your results only by: MyChart Message (if you have MyChart) OR A paper copy in the mail If you have any lab test that is abnormal or we need to change your treatment, we will call you to review the results.   Testing/Procedures: ECHO Your physician has requested that you have an  echocardiogram. Echocardiography is a painless test that uses sound waves to create images of your  heart. It provides your doctor with information about the size and shape of your heart and how well your heart's chambers and valves are working. This procedure takes approximately one hour. There are no restrictions for this procedure. Please do NOT wear cologne, perfume, aftershave, or lotions (deodorant is allowed). Please arrive 15 minutes prior to your appointment time.  Follow-Up: At [facility], you and your health needs are our priority.  As part of our continuing mission to provide you with exceptional heart care, we have created designated Provider Care Teams.  These Care Teams include your primary Cardiologist (physician) and Advanced Practice Providers (APPs -  Physician Assistants and Nurse Practitioners) who all work together to provide you with the care you need, when you need it.  Your next appointment:   6 month(s)  Provider:   [name], MD        Signed, Thomas Bollman, MD  01/19/2023 2:50 PM     [facility]

## 2023-01-19 NOTE — Patient Instructions (Signed)
Medication Instructions:  Your physician recommends that you continue on your current medications as directed. Please refer to the Current Medication list given to you today.  *If you need a refill on your cardiac medications before your next appointment, please call your pharmacy*   Lab Work: NONE If you have labs (blood work) drawn today and your tests are completely normal, you will receive your results only by: MyChart Message (if you have MyChart) OR A paper copy in the mail If you have any lab test that is abnormal or we need to change your treatment, we will call you to review the results.   Testing/Procedures: ECHO Your physician has requested that you have an echocardiogram. Echocardiography is a painless test that uses sound waves to create images of your heart. It provides your doctor with information about the size and shape of your heart and how well your heart's chambers and valves are working. This procedure takes approximately one hour. There are no restrictions for this procedure. Please do NOT wear cologne, perfume, aftershave, or lotions (deodorant is allowed). Please arrive 15 minutes prior to your appointment time.  Follow-Up: At [facility], you and your health needs are our priority.  As part of our continuing mission to provide you with exceptional heart care, we have created designated Provider Care Teams.  These Care Teams include your primary Cardiologist (physician) and Advanced Practice Providers (APPs -  Physician Assistants and Nurse Practitioners) who all work together to provide you with the care you need, when you need it.  Your next appointment:   6 month(s)  Provider:   Michael Cooper, MD   

## 2023-01-23 ENCOUNTER — Inpatient Hospital Stay (HOSPITAL_BASED_OUTPATIENT_CLINIC_OR_DEPARTMENT_OTHER): Payer: Medicare Other | Admitting: Internal Medicine

## 2023-01-23 VITALS — BP 146/67 | HR 95 | Temp 97.5°F | Resp 17 | Wt 165.9 lb

## 2023-01-23 DIAGNOSIS — C9000 Multiple myeloma not having achieved remission: Secondary | ICD-10-CM | POA: Diagnosis not present

## 2023-01-23 DIAGNOSIS — Z9049 Acquired absence of other specified parts of digestive tract: Secondary | ICD-10-CM | POA: Diagnosis not present

## 2023-01-23 DIAGNOSIS — C9001 Multiple myeloma in remission: Secondary | ICD-10-CM | POA: Diagnosis not present

## 2023-01-23 DIAGNOSIS — Z79899 Other long term (current) drug therapy: Secondary | ICD-10-CM | POA: Diagnosis not present

## 2023-01-23 DIAGNOSIS — N1832 Chronic kidney disease, stage 3b: Secondary | ICD-10-CM | POA: Diagnosis not present

## 2023-01-23 DIAGNOSIS — Z886 Allergy status to analgesic agent status: Secondary | ICD-10-CM | POA: Diagnosis not present

## 2023-01-23 DIAGNOSIS — M2550 Pain in unspecified joint: Secondary | ICD-10-CM | POA: Diagnosis not present

## 2023-01-23 DIAGNOSIS — I130 Hypertensive heart and chronic kidney disease with heart failure and stage 1 through stage 4 chronic kidney disease, or unspecified chronic kidney disease: Secondary | ICD-10-CM | POA: Diagnosis not present

## 2023-01-23 DIAGNOSIS — Z7901 Long term (current) use of anticoagulants: Secondary | ICD-10-CM | POA: Diagnosis not present

## 2023-01-23 DIAGNOSIS — Z888 Allergy status to other drugs, medicaments and biological substances status: Secondary | ICD-10-CM | POA: Diagnosis not present

## 2023-01-23 DIAGNOSIS — Z880 Allergy status to penicillin: Secondary | ICD-10-CM | POA: Diagnosis not present

## 2023-01-23 DIAGNOSIS — I5032 Chronic diastolic (congestive) heart failure: Secondary | ICD-10-CM | POA: Diagnosis not present

## 2023-01-23 DIAGNOSIS — Z885 Allergy status to narcotic agent status: Secondary | ICD-10-CM | POA: Diagnosis not present

## 2023-01-23 DIAGNOSIS — Z7969 Long term (current) use of other immunomodulators and immunosuppressants: Secondary | ICD-10-CM | POA: Diagnosis not present

## 2023-01-23 DIAGNOSIS — Z7952 Long term (current) use of systemic steroids: Secondary | ICD-10-CM | POA: Diagnosis not present

## 2023-01-23 DIAGNOSIS — Z881 Allergy status to other antibiotic agents status: Secondary | ICD-10-CM | POA: Diagnosis not present

## 2023-01-23 NOTE — Progress Notes (Signed)
[facility] [phone]   Fax:([phone]  OFFICE PROGRESS NOTE  [name], MD [phone] 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 2) History of pheochromocytoma LEFT adrenal gland status post resection on 03/16/2009 3) Prostate adenocarcinoma  PRIOR THERAPY:  1)Systemic therapy with weekly Velcade 1.3 mg/KG subcutaneously, Revlimid 25 mg p.o. daily for 21 days every 4 weeks as well as Decadron 20 mg p.o. weekly.  First dose November 07, 2017.  Status post 7 cycles. 2) definitive radiotherapy to the prostate cancer under the care of Dr. Kathrynn Running.  CURRENT THERAPY: Observation.  INTERVAL HISTORY: Thomas [name] 88 y.o. male returns to the clinic today for follow-up visit accompanied by his wife.  The patient is feeling fine today with no concerning complaints except for the arthralgia and weakness in his lower extremities.  He denied having any current chest pain, shortness of breath, cough or hemoptysis.  He has no nausea, vomiting, diarrhea or constipation.  He has no headache or visual changes.  He denied having any significant weight loss or night sweats.  He had repeat myeloma panel performed recently and he is here for evaluation and discussion of his lab results.  MEDICAL HISTORY: Past Medical History:  Diagnosis Date   BPH (benign prostatic hypertrophy)    Chronic diastolic heart failure 12/27/2013   Chronic kidney disease    Hx of bilateral renal cysts.   Encounter for antineoplastic chemotherapy 02/20/2018   Glaucoma    Heart murmur    History of atrial fibrillation 03/09/09   Hx of epiglottitis    2011   Hypercholesterolemia    Hypertension    Long term current use of amiodarone 11/07/2013   Amiodarone    Mild aortic stenosis    OSA on CPAP 06/26/2014   Paroxysmal atrial fibrillation 11/07/2013   Prostate  cancer 7/12   T1C Ad Ca  s/p TURP, observation only   Renal insufficiency 09/06/2011   Sinus node dysfunction 08/14/2014   Sleep apnea    Smoldering multiple myeloma     ALLERGIES:  is allergic to benadryl [diphenhydramine hcl], ceftriaxone sodium, contrast media [iodinated contrast media], diphenhydramine hcl, nsaids, penicillins, farxiga [dapagliflozin], codeine, ibuprofen, levaquin [levofloxacin], oxybutynin, tramadol hcl, chlorhexidine, and furosemide.  MEDICATIONS:  Current Outpatient Medications  Medication Sig Dispense Refill   acetaminophen (TYLENOL) 500 MG tablet Take 1,000 mg by mouth as needed.      apixaban (ELIQUIS) 2.5 MG TABS tablet Take 1 tablet (2.5 mg total) by mouth 2 (two) times daily. 180 tablet 3   Cholecalciferol (VITAMIN D3) 50 MCG (2000 UT) capsule Take 1 capsule by mouth daily.      dorzolamide (TRUSOPT) 2 % ophthalmic solution 1 drop 2 (two) times daily.     dorzolamide-timolol (COSOPT) 22.3-6.8 MG/ML ophthalmic solution 1 drop 2 (two) times daily.     hydrochlorothiazide (MICROZIDE) 12.5 MG capsule Take 1 capsule (12.5 mg total) by mouth daily. 90 capsule 1   latanoprost (XALATAN) 0.005 % ophthalmic solution INSTILL 1 DROP INTO BOTH EYES EVERY DAY AT NIGHT  6   timolol (TIMOPTIC) 0.5 % ophthalmic solution 1 drop 2 (two) times daily.     No current facility-administered medications for this visit.    SURGICAL HISTORY:  Past Surgical History:  Procedure Laterality Date   ADRENALECTOMY  02/12/11   Left, for Pheochromocytoma  CARDIOVERSION N/A 12/26/2013   Procedure: CARDIOVERSION;  Surgeon: Pricilla Riffle, MD;  Location: [facility] ENDOSCOPY;  Service: Cardiovascular;  Laterality: N/A;   CHOLECYSTECTOMY  02/12/11   GASTRECTOMY  1984   S/P gastrectomy for ulcer   KNEE SURGERY  1971   S/P Right knee surgery   TEE WITHOUT CARDIOVERSION N/A 12/26/2013   Procedure: TRANSESOPHAGEAL ECHOCARDIOGRAM (TEE);  Surgeon: Pricilla Riffle, MD;  Location: [facility] ENDOSCOPY;  Service:  Cardiovascular;  Laterality: N/A;   TRANSURETHRAL RESECTION OF PROSTATE  7/12    REVIEW OF SYSTEMS:  A comprehensive review of systems was negative except for: Constitutional: positive for fatigue Musculoskeletal: positive for arthralgias and muscle weakness   PHYSICAL EXAMINATION: General appearance: alert, cooperative and no distress Head: Normocephalic, without obvious abnormality, atraumatic Neck: no adenopathy, no JVD, supple, symmetrical, trachea midline and thyroid not enlarged, symmetric, no tenderness/mass/nodules Lymph nodes: Cervical, supraclavicular, and axillary nodes normal. Resp: clear to auscultation bilaterally Back: symmetric, no curvature. ROM normal. No CVA tenderness. Cardio: regular rate and rhythm, S1, S2 normal, no murmur, click, rub or gallop GI: soft, non-tender; bowel sounds normal; no masses,  no organomegaly Extremities: extremities normal, atraumatic, no cyanosis or edema  ECOG PERFORMANCE STATUS: 1 - Symptomatic but completely ambulatory  Blood pressure (!) 146/67, pulse 95, temperature (!) 97.5 F (36.4 C), temperature source Oral, resp. rate 17, weight 165 lb 14.4 oz (75.3 kg), SpO2 99 %.  LABORATORY DATA: Lab Results  Component Value Date   WBC 5.3 01/16/2023   HGB 15.3 01/16/2023   HCT 43.9 01/16/2023   MCV 91.3 01/16/2023   PLT 158 01/16/2023      Chemistry      Component Value Date/Time   NA 139 01/16/2023 1037   NA 139 06/04/2021 1041   NA 138 08/16/2017 1404   K 4.7 01/16/2023 1037   K 4.5 08/16/2017 1404   CL 109 01/16/2023 1037   CL 109 (H) 03/21/2013 0958   CO2 27 01/16/2023 1037   CO2 24 08/16/2017 1404   BUN 25 (H) 01/16/2023 1037   BUN 21 06/04/2021 1041   BUN 17.3 08/16/2017 1404   CREATININE 1.51 (H) 01/16/2023 1037   CREATININE 1.8 (H) 08/16/2017 1404      Component Value Date/Time   CALCIUM 10.6 (H) 01/16/2023 1037   CALCIUM 9.9 08/16/2017 1404   ALKPHOS 116 01/16/2023 1037   ALKPHOS 86 08/16/2017 1404   AST 19  01/16/2023 1037   AST 22 08/16/2017 1404   ALT 13 01/16/2023 1037   ALT 16 08/16/2017 1404   BILITOT 0.7 01/16/2023 1037   BILITOT 0.78 08/16/2017 1404       RADIOGRAPHIC STUDIES: No results found.  ASSESSMENT AND PLAN:  This is a very pleasant 88 years old white male with IgG smoldering Loma diagnosed in April 2011. The patient has been on observation since 2011. His recent myeloma panel showed further increase in the free kappa light chain. His recent skeletal bone survey showed no concerning findings for disease progression.  He has a focus of avascular necrosis but no significant lytic lesions. His bone marrow bone marrow biopsy and aspirate showed further increase in the plasma cells to 15%. The patient underwent treatment with subcutaneous weekly Velcade as well as Revlimid and Decadron status post 7 cycles. He has been on observation since that time more than 4 years ago. The patient had repeat myeloma panel performed recently.  I discussed the lab result with the patient and his wife.  There is  further increase in the free kappa light chain but no worsening anemia or renal dysfunction.  The patient has no interest in treatment and he would like to continue on observation. I will see him back for follow-up visit in 6 months with repeat myeloma panel. He was advised to call immediately if he has any other concerning symptoms in the interval. For the history of prostate cancer he is currently followed by Dr. Laverle Patter.  The patient voices understanding of current disease status and treatment options and is in agreement with the current care plan. All questions were answered. The patient knows to call the clinic with any problems, questions or concerns. We can certainly see the patient much sooner if necessary.  Disclaimer: This note was dictated with voice recognition software. Similar sounding words can inadvertently be transcribed and may not be corrected upon review.

## 2023-01-27 DIAGNOSIS — I10 Essential (primary) hypertension: Secondary | ICD-10-CM | POA: Diagnosis not present

## 2023-01-27 DIAGNOSIS — I4819 Other persistent atrial fibrillation: Secondary | ICD-10-CM | POA: Diagnosis not present

## 2023-01-27 DIAGNOSIS — G4733 Obstructive sleep apnea (adult) (pediatric): Secondary | ICD-10-CM | POA: Diagnosis not present

## 2023-02-14 ENCOUNTER — Ambulatory Visit (HOSPITAL_COMMUNITY): Payer: Medicare Other | Attending: Internal Medicine

## 2023-02-14 DIAGNOSIS — I350 Nonrheumatic aortic (valve) stenosis: Secondary | ICD-10-CM

## 2023-02-14 LAB — ECHOCARDIOGRAM COMPLETE
AR max vel: 1.28 cm2
AV Area VTI: 1.36 cm2
AV Area mean vel: 1.36 cm2
AV Mean grad: 29 mmHg
AV Peak grad: 51 mmHg
Ao pk vel: 3.57 m/s
Area-P 1/2: 4.33 cm2
MV M vel: 5.41 m/s
MV Peak grad: 117.1 mmHg
P 1/2 time: 386 msec
S' Lateral: 2.2 cm

## 2023-02-21 ENCOUNTER — Ambulatory Visit: Payer: Medicare Other | Admitting: Cardiovascular Disease

## 2023-02-26 DIAGNOSIS — I10 Essential (primary) hypertension: Secondary | ICD-10-CM | POA: Diagnosis not present

## 2023-02-26 DIAGNOSIS — I4819 Other persistent atrial fibrillation: Secondary | ICD-10-CM | POA: Diagnosis not present

## 2023-02-26 DIAGNOSIS — G4733 Obstructive sleep apnea (adult) (pediatric): Secondary | ICD-10-CM | POA: Diagnosis not present

## 2023-02-28 ENCOUNTER — Telehealth: Payer: Self-pay | Admitting: Cardiovascular Disease

## 2023-02-28 NOTE — Telephone Encounter (Signed)
Patient is returning call to discuss echo results. °

## 2023-03-01 NOTE — Telephone Encounter (Signed)
  Pt is calling back to f/u echo result

## 2023-03-01 NOTE — Telephone Encounter (Signed)
Called patient to discuss Echo results. Patient verbalizes understanding of normal ejection fraction, with plan to watch and wait since he is asymptomatic for valve disease and enlarged atria at this time. Patient scheduled for 6 month f/u appt w/ Dr. Excell Seltzer. Advised patient to call our office if any new symptoms such as weakness, fatigue, syncope, chest pain, dyspnea on exertion occur before follow up appt.

## 2023-03-03 ENCOUNTER — Other Ambulatory Visit: Payer: Self-pay

## 2023-03-03 DIAGNOSIS — I350 Nonrheumatic aortic (valve) stenosis: Secondary | ICD-10-CM

## 2023-03-08 DIAGNOSIS — G4733 Obstructive sleep apnea (adult) (pediatric): Secondary | ICD-10-CM | POA: Diagnosis not present

## 2023-03-29 DIAGNOSIS — I4819 Other persistent atrial fibrillation: Secondary | ICD-10-CM | POA: Diagnosis not present

## 2023-03-29 DIAGNOSIS — G4733 Obstructive sleep apnea (adult) (pediatric): Secondary | ICD-10-CM | POA: Diagnosis not present

## 2023-03-29 DIAGNOSIS — I10 Essential (primary) hypertension: Secondary | ICD-10-CM | POA: Diagnosis not present

## 2023-04-28 DIAGNOSIS — G4733 Obstructive sleep apnea (adult) (pediatric): Secondary | ICD-10-CM | POA: Diagnosis not present

## 2023-04-28 DIAGNOSIS — I4819 Other persistent atrial fibrillation: Secondary | ICD-10-CM | POA: Diagnosis not present

## 2023-04-28 DIAGNOSIS — I10 Essential (primary) hypertension: Secondary | ICD-10-CM | POA: Diagnosis not present

## 2023-05-29 DIAGNOSIS — G4733 Obstructive sleep apnea (adult) (pediatric): Secondary | ICD-10-CM | POA: Diagnosis not present

## 2023-05-29 DIAGNOSIS — I4819 Other persistent atrial fibrillation: Secondary | ICD-10-CM | POA: Diagnosis not present

## 2023-05-29 DIAGNOSIS — I10 Essential (primary) hypertension: Secondary | ICD-10-CM | POA: Diagnosis not present

## 2023-06-19 DIAGNOSIS — H35372 Puckering of macula, left eye: Secondary | ICD-10-CM | POA: Diagnosis not present

## 2023-06-19 DIAGNOSIS — I10 Essential (primary) hypertension: Secondary | ICD-10-CM | POA: Diagnosis not present

## 2023-06-19 DIAGNOSIS — H04123 Dry eye syndrome of bilateral lacrimal glands: Secondary | ICD-10-CM | POA: Diagnosis not present

## 2023-06-19 DIAGNOSIS — H401132 Primary open-angle glaucoma, bilateral, moderate stage: Secondary | ICD-10-CM | POA: Diagnosis not present

## 2023-06-27 ENCOUNTER — Encounter: Payer: Self-pay | Admitting: Cardiovascular Disease

## 2023-07-18 ENCOUNTER — Inpatient Hospital Stay: Payer: Medicare Other | Attending: Internal Medicine

## 2023-07-18 ENCOUNTER — Other Ambulatory Visit: Payer: Self-pay

## 2023-07-18 DIAGNOSIS — Z9079 Acquired absence of other genital organ(s): Secondary | ICD-10-CM | POA: Insufficient documentation

## 2023-07-18 DIAGNOSIS — C61 Malignant neoplasm of prostate: Secondary | ICD-10-CM | POA: Insufficient documentation

## 2023-07-18 DIAGNOSIS — Z885 Allergy status to narcotic agent status: Secondary | ICD-10-CM | POA: Insufficient documentation

## 2023-07-18 DIAGNOSIS — Z923 Personal history of irradiation: Secondary | ICD-10-CM | POA: Insufficient documentation

## 2023-07-18 DIAGNOSIS — R5383 Other fatigue: Secondary | ICD-10-CM | POA: Diagnosis not present

## 2023-07-18 DIAGNOSIS — C9000 Multiple myeloma not having achieved remission: Secondary | ICD-10-CM | POA: Insufficient documentation

## 2023-07-18 DIAGNOSIS — Z886 Allergy status to analgesic agent status: Secondary | ICD-10-CM | POA: Insufficient documentation

## 2023-07-18 DIAGNOSIS — I5032 Chronic diastolic (congestive) heart failure: Secondary | ICD-10-CM | POA: Insufficient documentation

## 2023-07-18 DIAGNOSIS — Z881 Allergy status to other antibiotic agents status: Secondary | ICD-10-CM | POA: Diagnosis not present

## 2023-07-18 DIAGNOSIS — Z91041 Radiographic dye allergy status: Secondary | ICD-10-CM | POA: Diagnosis not present

## 2023-07-18 DIAGNOSIS — Z79899 Other long term (current) drug therapy: Secondary | ICD-10-CM | POA: Insufficient documentation

## 2023-07-18 DIAGNOSIS — I480 Paroxysmal atrial fibrillation: Secondary | ICD-10-CM | POA: Diagnosis not present

## 2023-07-18 DIAGNOSIS — Z880 Allergy status to penicillin: Secondary | ICD-10-CM | POA: Insufficient documentation

## 2023-07-18 DIAGNOSIS — Z9049 Acquired absence of other specified parts of digestive tract: Secondary | ICD-10-CM | POA: Insufficient documentation

## 2023-07-18 DIAGNOSIS — Z7901 Long term (current) use of anticoagulants: Secondary | ICD-10-CM | POA: Insufficient documentation

## 2023-07-18 DIAGNOSIS — Z7969 Long term (current) use of other immunomodulators and immunosuppressants: Secondary | ICD-10-CM | POA: Diagnosis not present

## 2023-07-18 DIAGNOSIS — Z888 Allergy status to other drugs, medicaments and biological substances status: Secondary | ICD-10-CM | POA: Diagnosis not present

## 2023-07-18 LAB — CBC WITH DIFFERENTIAL (CANCER CENTER ONLY)
Abs Immature Granulocytes: 0.06 10*3/uL (ref 0.00–0.07)
Basophils Absolute: 0 10*3/uL (ref 0.0–0.1)
Basophils Relative: 0 %
Eosinophils Absolute: 0.1 10*3/uL (ref 0.0–0.5)
Eosinophils Relative: 2 %
HCT: 43.6 % (ref 39.0–52.0)
Hemoglobin: 14.9 g/dL (ref 13.0–17.0)
Immature Granulocytes: 1 %
Lymphocytes Relative: 23 %
Lymphs Abs: 1.5 10*3/uL (ref 0.7–4.0)
MCH: 31.6 pg (ref 26.0–34.0)
MCHC: 34.2 g/dL (ref 30.0–36.0)
MCV: 92.4 fL (ref 80.0–100.0)
Monocytes Absolute: 0.8 10*3/uL (ref 0.1–1.0)
Monocytes Relative: 12 %
Neutro Abs: 3.9 10*3/uL (ref 1.7–7.7)
Neutrophils Relative %: 62 %
Platelet Count: 159 10*3/uL (ref 150–400)
RBC: 4.72 MIL/uL (ref 4.22–5.81)
RDW: 14.2 % (ref 11.5–15.5)
WBC Count: 6.3 10*3/uL (ref 4.0–10.5)
nRBC: 0 % (ref 0.0–0.2)

## 2023-07-18 LAB — CMP (CANCER CENTER ONLY)
ALT: 12 U/L (ref 0–44)
AST: 20 U/L (ref 15–41)
Albumin: 4.2 g/dL (ref 3.5–5.0)
Alkaline Phosphatase: 86 U/L (ref 38–126)
Anion gap: 5 (ref 5–15)
BUN: 27 mg/dL — ABNORMAL HIGH (ref 8–23)
CO2: 25 mmol/L (ref 22–32)
Calcium: 10.6 mg/dL — ABNORMAL HIGH (ref 8.9–10.3)
Chloride: 109 mmol/L (ref 98–111)
Creatinine: 1.36 mg/dL — ABNORMAL HIGH (ref 0.61–1.24)
GFR, Estimated: 51 mL/min — ABNORMAL LOW (ref >60)
Glucose, Bld: 97 mg/dL (ref 70–99)
Potassium: 4.2 mmol/L (ref 3.5–5.1)
Sodium: 139 mmol/L (ref 135–145)
Total Bilirubin: 1.1 mg/dL (ref 0.3–1.2)
Total Protein: 7.1 g/dL (ref 6.5–8.1)

## 2023-07-18 LAB — LACTATE DEHYDROGENASE: LDH: 199 U/L — ABNORMAL HIGH (ref 98–192)

## 2023-07-19 LAB — KAPPA/LAMBDA LIGHT CHAINS
Kappa free light chain: 101.7 mg/L — ABNORMAL HIGH (ref 3.3–19.4)
Kappa, lambda light chain ratio: 6.92 — ABNORMAL HIGH (ref 0.26–1.65)
Lambda free light chains: 14.7 mg/L (ref 5.7–26.3)

## 2023-07-19 LAB — BETA 2 MICROGLOBULIN, SERUM: Beta-2 Microglobulin: 3.1 mg/L — ABNORMAL HIGH (ref 0.6–2.4)

## 2023-07-19 LAB — IGG, IGA, IGM
IgA: 82 mg/dL (ref 61–437)
IgG (Immunoglobin G), Serum: 1392 mg/dL (ref 603–1613)
IgM (Immunoglobulin M), Srm: 51 mg/dL (ref 15–143)

## 2023-07-25 ENCOUNTER — Other Ambulatory Visit: Payer: Self-pay

## 2023-07-25 ENCOUNTER — Inpatient Hospital Stay: Payer: Medicare Other | Admitting: Internal Medicine

## 2023-07-25 VITALS — BP 170/72 | HR 81 | Temp 97.6°F | Resp 19 | Wt 166.5 lb

## 2023-07-25 DIAGNOSIS — Z880 Allergy status to penicillin: Secondary | ICD-10-CM | POA: Diagnosis not present

## 2023-07-25 DIAGNOSIS — I480 Paroxysmal atrial fibrillation: Secondary | ICD-10-CM | POA: Diagnosis not present

## 2023-07-25 DIAGNOSIS — C61 Malignant neoplasm of prostate: Secondary | ICD-10-CM | POA: Diagnosis not present

## 2023-07-25 DIAGNOSIS — Z79899 Other long term (current) drug therapy: Secondary | ICD-10-CM | POA: Diagnosis not present

## 2023-07-25 DIAGNOSIS — Z7901 Long term (current) use of anticoagulants: Secondary | ICD-10-CM | POA: Diagnosis not present

## 2023-07-25 DIAGNOSIS — Z886 Allergy status to analgesic agent status: Secondary | ICD-10-CM | POA: Diagnosis not present

## 2023-07-25 DIAGNOSIS — Z9049 Acquired absence of other specified parts of digestive tract: Secondary | ICD-10-CM | POA: Diagnosis not present

## 2023-07-25 DIAGNOSIS — Z885 Allergy status to narcotic agent status: Secondary | ICD-10-CM | POA: Diagnosis not present

## 2023-07-25 DIAGNOSIS — C9000 Multiple myeloma not having achieved remission: Secondary | ICD-10-CM

## 2023-07-25 DIAGNOSIS — R5383 Other fatigue: Secondary | ICD-10-CM | POA: Diagnosis not present

## 2023-07-25 DIAGNOSIS — Z7969 Long term (current) use of other immunomodulators and immunosuppressants: Secondary | ICD-10-CM | POA: Diagnosis not present

## 2023-07-25 DIAGNOSIS — Z91041 Radiographic dye allergy status: Secondary | ICD-10-CM | POA: Diagnosis not present

## 2023-07-25 DIAGNOSIS — Z881 Allergy status to other antibiotic agents status: Secondary | ICD-10-CM | POA: Diagnosis not present

## 2023-07-25 DIAGNOSIS — Z923 Personal history of irradiation: Secondary | ICD-10-CM | POA: Diagnosis not present

## 2023-07-25 DIAGNOSIS — I5032 Chronic diastolic (congestive) heart failure: Secondary | ICD-10-CM | POA: Diagnosis not present

## 2023-07-25 DIAGNOSIS — Z888 Allergy status to other drugs, medicaments and biological substances status: Secondary | ICD-10-CM | POA: Diagnosis not present

## 2023-07-25 NOTE — Progress Notes (Signed)
[name] Telephone:([phone]   Fax:([phone]  OFFICE PROGRESS NOTE  Thomas Montana, MD [phone] 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 2) History of pheochromocytoma LEFT adrenal gland status post resection on 03/16/2009 3) Prostate adenocarcinoma  PRIOR THERAPY:  1)Systemic therapy with weekly Velcade 1.3 mg/KG subcutaneously, Revlimid 25 mg p.o. daily for 21 days every 4 weeks as well as Decadron 20 mg p.o. weekly.  First dose November 07, 2017.  Status post 7 cycles. 2) definitive radiotherapy to the prostate cancer under the care of Dr. Kathrynn Running.  CURRENT THERAPY: Observation.  INTERVAL HISTORY: Thomas [name] 88 y.o. male returns to the clinic today for 1-month follow-up visit.Discussed the use of AI scribe software for clinical note transcription with the patient, who gave verbal consent to proceed.  History of Present Illness   The patient, with a history of multiple myeloma and prostate cancer, has been on observation for over four years following treatment with Velcade and Decadron. They report feeling well over the past six months with no new complaints or health issues. They deny any chest pain, breathing difficulties, nausea, vomiting, or diarrhea.  The patient's prostate cancer, diagnosed at an early stage, was treated with radiation therapy. They are not currently on hormonal therapy and are scheduled to see their urologist in February. They have been undergoing regular blood tests every three months to monitor their PSA levels, which have been gradually increasing but not significantly.  The patient's myeloma has been under control, with recent protein updates showing normal levels. The free light chain levels have decreased slightly compared to six months ago. The patient's kidney function has also improved,  with a lower serum creatinine level compared to six months prior.  Overall, the patient's health status has been stable with no new complaints or significant changes in their condition.        MEDICAL HISTORY: Past Medical History:  Diagnosis Date   BPH (benign prostatic hypertrophy)    Chronic diastolic heart failure (HCC) 12/27/2013   Chronic kidney disease    Hx of bilateral renal cysts.   Encounter for antineoplastic chemotherapy 02/20/2018   Glaucoma    Heart murmur    History of atrial fibrillation 03/09/09   Hx of epiglottitis    2011   Hypercholesterolemia    Hypertension    Long term current use of amiodarone 11/07/2013   Amiodarone    Mild aortic stenosis    OSA on CPAP 06/26/2014   Paroxysmal atrial fibrillation (HCC) 11/07/2013   Prostate cancer (HCC) 7/12   T1C Ad Ca  s/p TURP, observation only   Renal insufficiency 09/06/2011   Sinus node dysfunction (HCC) 08/14/2014   Sleep apnea    Smoldering multiple myeloma     ALLERGIES:  is allergic to benadryl [diphenhydramine hcl], ceftriaxone sodium, contrast media [iodinated contrast media], diphenhydramine hcl, nsaids, penicillins, farxiga [dapagliflozin], codeine, ibuprofen, levaquin [levofloxacin], oxybutynin, tramadol hcl, chlorhexidine, and furosemide.  MEDICATIONS:  Current Outpatient Medications  Medication Sig Dispense Refill   acetaminophen (TYLENOL) 500 MG tablet Take 1,000 mg by mouth as needed.      apixaban (ELIQUIS) 2.5 MG TABS tablet Take 1 tablet (2.5 mg total) by mouth 2 (two) times daily. 180 tablet 3   Cholecalciferol (VITAMIN D3) 50 MCG (2000 UT) capsule Take 1 capsule by mouth daily.  dorzolamide (TRUSOPT) 2 % ophthalmic solution 1 drop 2 (two) times daily.     dorzolamide-timolol (COSOPT) 22.3-6.8 MG/ML ophthalmic solution 1 drop 2 (two) times daily.     hydrochlorothiazide (MICROZIDE) 12.5 MG capsule Take 1 capsule (12.5 mg total) by mouth daily. 90 capsule 1   latanoprost (XALATAN) 0.005 %  ophthalmic solution INSTILL 1 DROP INTO BOTH EYES EVERY DAY AT NIGHT  6   timolol (TIMOPTIC) 0.5 % ophthalmic solution 1 drop 2 (two) times daily.     No current facility-administered medications for this visit.    SURGICAL HISTORY:  Past Surgical History:  Procedure Laterality Date   ADRENALECTOMY  02/12/11   Left, for Pheochromocytoma   CARDIOVERSION N/A 12/26/2013   Procedure: CARDIOVERSION;  Surgeon: Pricilla Riffle, MD;  Location: [facility] ENDOSCOPY;  Service: Cardiovascular;  Laterality: N/A;   CHOLECYSTECTOMY  02/12/11   GASTRECTOMY  1984   S/P gastrectomy for ulcer   KNEE SURGERY  1971   S/P Right knee surgery   TEE WITHOUT CARDIOVERSION N/A 12/26/2013   Procedure: TRANSESOPHAGEAL ECHOCARDIOGRAM (TEE);  Surgeon: Pricilla Riffle, MD;  Location: [facility] ENDOSCOPY;  Service: Cardiovascular;  Laterality: N/A;   TRANSURETHRAL RESECTION OF PROSTATE  7/12    REVIEW OF SYSTEMS:  A comprehensive review of systems was negative except for: Constitutional: positive for fatigue   PHYSICAL EXAMINATION: General appearance: alert, cooperative and no distress Head: Normocephalic, without obvious abnormality, atraumatic Neck: no adenopathy, no JVD, supple, symmetrical, trachea midline and thyroid not enlarged, symmetric, no tenderness/mass/nodules Lymph nodes: Cervical, supraclavicular, and axillary nodes normal. Resp: clear to auscultation bilaterally Back: symmetric, no curvature. ROM normal. No CVA tenderness. Cardio: regular rate and rhythm, S1, S2 normal, no murmur, click, rub or gallop GI: soft, non-tender; bowel sounds normal; no masses,  no organomegaly Extremities: extremities normal, atraumatic, no cyanosis or edema  ECOG PERFORMANCE STATUS: 1 - Symptomatic but completely ambulatory  Blood pressure (!) 170/72, pulse 81, temperature 97.6 F (36.4 C), temperature source Oral, resp. rate 19, weight 166 lb 8 oz (75.5 kg), SpO2 100%.  LABORATORY DATA: Lab Results  Component Value Date   WBC 6.3  07/18/2023   HGB 14.9 07/18/2023   HCT 43.6 07/18/2023   MCV 92.4 07/18/2023   PLT 159 07/18/2023      Chemistry      Component Value Date/Time   NA 139 07/18/2023 1351   NA 139 06/04/2021 1041   NA 138 08/16/2017 1404   K 4.2 07/18/2023 1351   K 4.5 08/16/2017 1404   CL 109 07/18/2023 1351   CL 109 (H) 03/21/2013 0958   CO2 25 07/18/2023 1351   CO2 24 08/16/2017 1404   BUN 27 (H) 07/18/2023 1351   BUN 21 06/04/2021 1041   BUN 17.3 08/16/2017 1404   CREATININE 1.36 (H) 07/18/2023 1351   CREATININE 1.8 (H) 08/16/2017 1404      Component Value Date/Time   CALCIUM 10.6 (H) 07/18/2023 1351   CALCIUM 9.9 08/16/2017 1404   ALKPHOS 86 07/18/2023 1351   ALKPHOS 86 08/16/2017 1404   AST 20 07/18/2023 1351   AST 22 08/16/2017 1404   ALT 12 07/18/2023 1351   ALT 16 08/16/2017 1404   BILITOT 1.1 07/18/2023 1351   BILITOT 0.78 08/16/2017 1404       RADIOGRAPHIC STUDIES: No results found.  ASSESSMENT AND PLAN:  This is a very pleasant 88 years old white male with IgG smoldering Loma diagnosed in April 2011. The patient has been on observation since  2011. His recent myeloma panel showed further increase in the free kappa light chain. His recent skeletal bone survey showed no concerning findings for disease progression.  He has a focus of avascular necrosis but no significant lytic lesions. His bone marrow bone marrow biopsy and aspirate showed further increase in the plasma cells to 15%. The patient underwent treatment with subcutaneous weekly Velcade as well as Revlimid and Decadron status post 7 cycles. He has been on observation since that time more than 4 years ago.  The patient is feeling fine with no concerning complaints. Repeat myeloma panel showed no concerning findings for progression.    Multiple Myeloma Stable disease with no new complaints. Last treated with Velcade and Decadron with good response. Currently on observation for more than four years. Recent labs show  stable protein levels and free light chains. CBC and kidney function are within normal limits. -Continue observation. -Repeat myeloma panel in six months.  Prostate Cancer Early stage, treated with radiation therapy. No current hormonal therapy. PSA levels are monitored every three months by urologist, with slight but not significant increases. -Continue current management under urologist's care. -Follow-up with urologist in February.    For the history of prostate cancer he is currently followed by Dr. Laverle Patter. The patient was advised to call immediately if he has any concerning symptoms in the interval. The patient voices understanding of current disease status and treatment options and is in agreement with the current care plan. All questions were answered. The patient knows to call the clinic with any problems, questions or concerns. We can certainly see the patient much sooner if necessary.  Disclaimer: This note was dictated with voice recognition software. Similar sounding words can inadvertently be transcribed and may not be corrected upon review.

## 2023-08-07 DIAGNOSIS — M109 Gout, unspecified: Secondary | ICD-10-CM | POA: Diagnosis not present

## 2023-08-07 DIAGNOSIS — I5032 Chronic diastolic (congestive) heart failure: Secondary | ICD-10-CM | POA: Diagnosis not present

## 2023-08-07 DIAGNOSIS — C9000 Multiple myeloma not having achieved remission: Secondary | ICD-10-CM | POA: Diagnosis not present

## 2023-08-07 DIAGNOSIS — E032 Hypothyroidism due to medicaments and other exogenous substances: Secondary | ICD-10-CM | POA: Diagnosis not present

## 2023-08-07 DIAGNOSIS — Z0000 Encounter for general adult medical examination without abnormal findings: Secondary | ICD-10-CM | POA: Diagnosis not present

## 2023-08-07 DIAGNOSIS — D6869 Other thrombophilia: Secondary | ICD-10-CM | POA: Diagnosis not present

## 2023-08-07 DIAGNOSIS — I77810 Thoracic aortic ectasia: Secondary | ICD-10-CM | POA: Diagnosis not present

## 2023-08-07 DIAGNOSIS — I480 Paroxysmal atrial fibrillation: Secondary | ICD-10-CM | POA: Diagnosis not present

## 2023-08-07 DIAGNOSIS — I350 Nonrheumatic aortic (valve) stenosis: Secondary | ICD-10-CM | POA: Diagnosis not present

## 2023-08-07 DIAGNOSIS — M8588 Other specified disorders of bone density and structure, other site: Secondary | ICD-10-CM | POA: Diagnosis not present

## 2023-08-07 DIAGNOSIS — N183 Chronic kidney disease, stage 3 unspecified: Secondary | ICD-10-CM | POA: Diagnosis not present

## 2023-08-07 DIAGNOSIS — I129 Hypertensive chronic kidney disease with stage 1 through stage 4 chronic kidney disease, or unspecified chronic kidney disease: Secondary | ICD-10-CM | POA: Diagnosis not present

## 2023-08-07 DIAGNOSIS — E785 Hyperlipidemia, unspecified: Secondary | ICD-10-CM | POA: Diagnosis not present

## 2023-09-04 ENCOUNTER — Other Ambulatory Visit: Payer: Self-pay

## 2023-09-04 DIAGNOSIS — I480 Paroxysmal atrial fibrillation: Secondary | ICD-10-CM

## 2023-09-04 MED ORDER — APIXABAN 2.5 MG PO TABS: 2.5000 mg | ORAL_TABLET | Freq: Two times a day (BID) | ORAL | 1 refills | Status: DC

## 2023-09-04 NOTE — Telephone Encounter (Signed)
Prescription refill request for Eliquis received. Indication: Afib  Last office visit: 01/19/23 Excell Seltzer)  Scr: 1.36 (07/18/23)  Age: 88 Weight: 75.5kg  Per dosing criteria, pt's dose may need to be increased to 5mg  BID.  Office visit note on 01/19/23 -  "No recurrent bleeding problems reported. Seems to be doing well on low-dose apixaban. Recent hemoglobin is 15.3."   Will forward to PharmD to review.

## 2023-09-04 NOTE — Telephone Encounter (Signed)
Pt has been on this dose for many years, had bleeding problems while on warfarin.  No changes at this time, will continue with the lower dose

## 2023-09-08 ENCOUNTER — Ambulatory Visit (HOSPITAL_COMMUNITY): Payer: Medicare Other | Attending: Cardiovascular Disease

## 2023-09-08 ENCOUNTER — Ambulatory Visit: Payer: Medicare Other | Admitting: Cardiovascular Disease

## 2023-09-08 DIAGNOSIS — I350 Nonrheumatic aortic (valve) stenosis: Secondary | ICD-10-CM | POA: Diagnosis not present

## 2023-09-08 LAB — ECHOCARDIOGRAM COMPLETE
AR max vel: 0.8 cm2
AV Area VTI: 0.84 cm2
AV Area mean vel: 0.77 cm2
AV Mean grad: 23.7 mm[Hg]
AV Peak grad: 42.9 mm[Hg]
Ao pk vel: 3.27 m/s
P 1/2 time: 416 ms
S' Lateral: 2.4 cm

## 2023-10-10 ENCOUNTER — Encounter: Payer: Self-pay | Admitting: Cardiovascular Disease

## 2023-10-10 ENCOUNTER — Ambulatory Visit: Payer: Medicare Other | Attending: Cardiovascular Disease | Admitting: Cardiovascular Disease

## 2023-10-10 VITALS — BP 150/82 | HR 63 | Ht 64.75 in | Wt 166.4 lb

## 2023-10-10 DIAGNOSIS — I350 Nonrheumatic aortic (valve) stenosis: Secondary | ICD-10-CM

## 2023-10-10 DIAGNOSIS — I4819 Other persistent atrial fibrillation: Secondary | ICD-10-CM | POA: Diagnosis not present

## 2023-10-10 DIAGNOSIS — I10 Essential (primary) hypertension: Secondary | ICD-10-CM | POA: Diagnosis not present

## 2023-10-10 NOTE — Patient Instructions (Signed)
 Follow-Up: At [facility], you and your health needs are our priority.  As part of our continuing mission to provide you with exceptional heart care, we have created designated Provider Care Teams.  These Care Teams include your primary Cardiologist (physician) and Advanced Practice Providers (APPs -  Physician Assistants and Nurse Practitioners) who all work together to provide you with the care you need, when you need it.  We recommend signing up for the patient portal called "MyChart".  Sign up information is provided on this After Visit Summary.  MyChart is used to connect with patients for Virtual Visits (Telemedicine).  Patients are able to view lab/test results, encounter notes, upcoming appointments, etc.  Non-urgent messages can be sent to your provider as well.   To learn more about what you can do with MyChart, go to [email or web address].    Your next appointment:   6 month(s)  Provider:   [name], MD

## 2023-10-10 NOTE — Progress Notes (Signed)
 Cardiology Office Note:    Date:  10/13/2023   ID:  Thomas [name], DOB [birth year], MRN [id number]  PCP:  [name], MD   [facility] Providers Cardiologist:  [name], MD Sleep Medicine:  Nicki Guadalajara, MD     Referring MD: [name], MD   Chief Complaint  Patient presents with   Shortness of Breath    History of Present Illness:    Thomas [name] is a 89 y.o. male presenting for follow-up of aortic stenosis.  I met this patient in April 2024 after Dr. Katrinka Blazing retired.  He has a history of permanent atrial fibrillation, chronic diastolic heart failure, stage IIIb chronic kidney disease, recurrent GU bleeding, metastatic prostate cancer, and indolent multiple myeloma.  At the time of his last visit, we discussed the severity of his aortic stenosis was at least in the moderately severe range and he also had moderate aortic insufficiency.  He favored a conservative approach because of mild symptoms, especially in the setting of his comorbid conditions.  He presents today for follow-up after a surveillance echocardiogram.  The patient now admits to modest progression of his shortness of breath with walking.  He also has increased fatigue.  He denies chest pain or pressure, lightheadedness, heart palpitations, or syncope.  No orthopnea or PND.  He has not had any recent bleeding problems.  His recent echocardiogram shows an LVEF of 60 to 65%, normal RV function, severe calcification and restriction of the aortic valve with moderate aortic regurgitation and findings consistent with severe paradoxical low-flow low gradient aortic stenosis.  The peak transaortic velocity is 3.5 m/s, mean gradient 26 mmHg, calculated aortic valve area 0.8 cm, dimensionless index 0.24, and stroke-volume index of 29.   Current Medications: Current Meds  Medication Sig   acetaminophen (TYLENOL) 500 MG tablet Take 1,000 mg by mouth as needed.    apixaban (ELIQUIS) 2.5 MG TABS tablet Take  1 tablet (2.5 mg total) by mouth 2 (two) times daily.   Cholecalciferol (VITAMIN D3) 50 MCG (2000 UT) capsule Take 1 capsule by mouth daily.    dorzolamide (TRUSOPT) 2 % ophthalmic solution 1 drop 2 (two) times daily.   dorzolamide-timolol (COSOPT) 22.3-6.8 MG/ML ophthalmic solution 1 drop 2 (two) times daily.   latanoprost (XALATAN) 0.005 % ophthalmic solution INSTILL 1 DROP INTO BOTH EYES EVERY DAY AT NIGHT   timolol (TIMOPTIC) 0.5 % ophthalmic solution 1 drop 2 (two) times daily.   [DISCONTINUED] hydrochlorothiazide (MICROZIDE) 12.5 MG capsule Take 1 capsule (12.5 mg total) by mouth daily.     Allergies:   Benadryl [diphenhydramine hcl], Ceftriaxone sodium, Contrast media [iodinated contrast media], Diphenhydramine hcl, Nsaids, Penicillins, Farxiga [dapagliflozin], Codeine, Ibuprofen, Levaquin [levofloxacin], Oxybutynin, Tramadol hcl, Chlorhexidine, and Furosemide   ROS:   Please see the history of present illness.    All other systems reviewed and are negative.  EKGs/Labs/Other Studies Reviewed:    The following studies were reviewed today: Cardiac Studies & Procedures      ECHOCARDIOGRAM  ECHOCARDIOGRAM COMPLETE 09/08/2023  Narrative ECHOCARDIOGRAM REPORT    Patient Name:   Thomas [name] Date of Exam: 09/08/2023 Medical Rec #:  [id number]         Height:       64.5 in Accession #:    [id number]        Weight:       166.5 lb Date of Birth:  [birth year]         BSA:  1.819 m Patient Age:    88 years          BP:           170/72 mmHg Patient Gender: M                 HR:           103 bpm. Exam Location:  [address]  Procedure: 2D Echo, Cardiac Doppler and Color Doppler  Indications:    I35.0 Nonrheumatic aortic (valve) stenosis  History:        Patient has prior history of Echocardiogram examinations, most recent 02/14/2023. CHF, Arrythmias:Atrial Fibrillation; Risk Factors:Hypertension, Dyslipidemia and Sleep Apnea. Sinus node dysfunction. Multiple myeloma.  Chronic kidney disease.  Sonographer:    Cathie Beams RCS Referring Phys: [id number] Neema Fluegge  IMPRESSIONS   1. Left ventricular ejection fraction, by estimation, is 60 to 65%. The left ventricle has normal function. The left ventricle has no regional wall motion abnormalities. There is moderate asymmetric left ventricular hypertrophy of the basal-septal segment. Left ventricular diastolic parameters are indeterminate. 2. Right ventricular systolic function is normal. The right ventricular size is normal. There is normal pulmonary artery systolic pressure. 3. Left atrial size was mildly dilated. 4. The mitral valve is normal in structure. Mild mitral valve regurgitation. 5. Aortic dilatation noted. There is dilatation of the aortic root, measuring 41 mm. There is dilatation of the ascending aorta, measuring 41 mm. 6. The aortic valve is calcified. There is severe calcifcation of the aortic valve. Aortic valve regurgitation is moderate. Severe aortic valve stenosis. Moderate AS by gradients (Vmax 3.5 m/s, MG ), severe by AVA (0.8cm^2) and DI (0.24). Low SV index (29cc/m^2), suspect paradoxical low flow low gradient severe AS  FINDINGS Left Ventricle: Left ventricular ejection fraction, by estimation, is 60 to 65%. The left ventricle has normal function. The left ventricle has no regional wall motion abnormalities. The left ventricular internal cavity size was normal in size. There is moderate asymmetric left ventricular hypertrophy of the basal-septal segment. Left ventricular diastolic parameters are indeterminate.  Right Ventricle: The right ventricular size is normal. No increase in right ventricular wall thickness. Right ventricular systolic function is normal. There is normal pulmonary artery systolic pressure. The tricuspid regurgitant velocity is 2.17 m/s, and with an assumed right atrial pressure of 8 mmHg, the estimated right ventricular systolic pressure is 26.8 mmHg.  Left  Atrium: Left atrial size was mildly dilated.  Right Atrium: Right atrial size was normal in size.  Pericardium: There is no evidence of pericardial effusion.  Mitral Valve: The mitral valve is normal in structure. Mild mitral valve regurgitation.  Tricuspid Valve: The tricuspid valve is normal in structure. Tricuspid valve regurgitation is trivial.  Aortic Valve: The aortic valve is calcified. There is severe calcifcation of the aortic valve. Aortic valve regurgitation is moderate. Aortic regurgitation PHT measures 416 msec. Severe aortic stenosis is present. Aortic valve mean gradient measures 23.7 mmHg. Aortic valve peak gradient measures 42.9 mmHg. Aortic valve area, by VTI measures 0.84 cm.  Pulmonic Valve: The pulmonic valve was not well visualized. Pulmonic valve regurgitation is not visualized.  Aorta: Aortic dilatation noted. There is dilatation of the aortic root, measuring 41 mm. There is dilatation of the ascending aorta, measuring 41 mm.  IAS/Shunts: The interatrial septum was not well visualized.   LEFT VENTRICLE PLAX 2D LVIDd:         3.90 cm LVIDs:         2.40 cm LV  PW:         1.00 cm LV IVS:        1.20 cm LVOT diam:     2.10 cm LV SV:         51 LV SV Index:   28 LVOT Area:     3.46 cm   RIGHT VENTRICLE TAPSE (M-mode): 1.6 cm RVSP:           21.8 mmHg  LEFT ATRIUM             Index        RIGHT ATRIUM           Index LA diam:        4.30 cm 2.36 cm/m   RA Pressure: 3.00 mmHg LA Vol (A2C):   82.5 ml 45.34 ml/m  RA Area:     12.90 cm LA Vol (A4C):   50.2 ml 27.59 ml/m  RA Volume:   26.30 ml  14.46 ml/m LA Biplane Vol: 69.1 ml 37.98 ml/m AORTIC VALVE AV Area (Vmax):    0.80 cm AV Area (Vmean):   0.77 cm AV Area (VTI):     0.84 cm AV Vmax:           327.33 cm/s AV Vmean:          224.000 cm/s AV VTI:            0.606 m AV Peak Grad:      42.9 mmHg AV Mean Grad:      23.7 mmHg LVOT Vmax:         75.30 cm/s LVOT Vmean:        50.000  cm/s LVOT VTI:          0.147 m LVOT/AV VTI ratio: 0.24 AI PHT:            416 msec  AORTA Ao Root diam: 4.10 cm Ao Asc diam:  4.10 cm  TRICUSPID VALVE TR Peak grad:   18.8 mmHg TR Vmax:        217.00 cm/s Estimated RAP:  3.00 mmHg RVSP:           21.8 mmHg  SHUNTS Systemic VTI:  0.15 m Systemic Diam: 2.10 cm  Epifanio Lesches MD Electronically signed by Epifanio Lesches MD Signature Date/Time: 09/08/2023/3:56:10 PM    Final   MONITORS  LONG TERM MONITOR (3-14 DAYS) 09/27/2022  Narrative   Continuous atrial fibrillation / Atrial flutter with ave HR81 bpm and range 52-124 bpm   Isolated 7 beat WCT at 162 bpm.   PVC burden < 1%.   Patch Wear Time:  6 days and 23 hours (2023-12-13T08:47:09-499 to 2023-12-20T08:36:16-0500)  1 run of Ventricular Tachycardia occurred lasting 7 beats with a max rate of 162 bpm (avg 122 bpm). Atrial Flutter occurred continuously (100% burden), ranging from 52-124 bpm (avg of 81 bpm). Isolated VEs were rare (<1.0%, 3866), VE Couplets were rare (<1.0%, 67), and VE Triplets were rare (<1.0%, 5). Ventricular Bigeminy and Trigeminy were present.           EKG:        Recent Labs: 07/18/2023: ALT 12; BUN 27; Creatinine 1.36; Hemoglobin 14.9; Platelet Count 159; Potassium 4.2; Sodium 139  Recent Lipid Panel    Component Value Date/Time   CHOL 164 02/25/2020 0842   TRIG 131 02/25/2020 0842   HDL 41 02/25/2020 0842   CHOLHDL 4.0 02/25/2020 0842   LDLCALC 100 (H) 02/25/2020 1914  Physical Exam:    VS:  BP (!) 150/82   Pulse 63   Ht 5' 4.75" (1.645 m)   Wt 166 lb 6.4 oz (75.5 kg)   SpO2 97%   BMI 27.90 kg/m     Wt Readings from Last 3 Encounters:  10/10/23 166 lb 6.4 oz (75.5 kg)  07/25/23 166 lb 8 oz (75.5 kg)  01/23/23 165 lb 14.4 oz (75.3 kg)     GEN:  Well nourished, well developed pleasant elderly male in no acute distress HEENT: Normal NECK: No JVD; No carotid bruits LYMPHATICS: No  lymphadenopathy CARDIAC: Irregularly irregular, 3/6 harsh crescendo decrescendo murmur at the right upper sternal border with absent A2 RESPIRATORY:  Clear to auscultation without rales, wheezing or rhonchi  ABDOMEN: Soft, non-tender, non-distended MUSCULOSKELETAL:  No edema; No deformity  SKIN: Warm and dry NEUROLOGIC:  Alert and oriented x 3 PSYCHIATRIC:  Normal affect   Assessment & Plan Nonrheumatic aortic (valve) stenosis The patient has severe, paradoxical low-flow low gradient aortic stenosis.  By echo there has been some further progression of his aortic stenosis based on the parameters outlined above in the HPI.  The patient has NYHA functional class II symptoms of exertional dyspnea and fatigue.  We again reviewed the natural history of aortic stenosis, the progressive nature of the disease, and potential treatment options.  His treatment options would be limited to either palliative medical therapy or TAVR.  I do not think he be a candidate for surgical intervention under any circumstances.  He initially wanted to discuss things further with his family before proceeding with any TAVR evaluation, but he called back after making a decision to proceed with further workup.  We discussed that he would need diagnostic cardiac catheterization, both right and left heart catheterization. I have reviewed the risks, indications, and alternatives to cardiac catheterization, possible angioplasty, and stenting with the patient. Risks include but are not limited to bleeding, infection, vascular injury, stroke, myocardial infection, arrhythmia, kidney injury, radiation-related injury in the case of prolonged fluoroscopy use, emergency cardiac surgery, and death. The patient understands the risks of serious complication is 1-2 in 1000 with diagnostic cardiac cath and 1-2% or less with angioplasty/stenting.  He will also require a gated CTA of the heart and a CTA of the chest, abdomen, and pelvis.  Once his  studies are completed he will be referred for cardiac surgical consultation as part of a multidisciplinary approach to his care.  His most recent labs show stable renal function and I think he will be an appropriate candidate for catheter angiography with limited contrast as well as renal protocol CTA studies. Persistent atrial fibrillation (HCC) Anticoagulated with apixaban.  This will be held before cardiac catheterization per protocol. Essential hypertension Blood pressure is controlled       Informed Consent   Shared Decision Making/Informed Consent The risks [stroke (1 in 1000), death (1 in 1000), kidney failure [usually temporary] (1 in 500), bleeding (1 in 200), allergic reaction [possibly serious] (1 in 200)], benefits (diagnostic support and management of coronary artery disease) and alternatives of a cardiac catheterization were discussed in detail with Mr. Guedes and he is willing to proceed.       Medication Adjustments/Labs and Tests Ordered: Current medicines are reviewed at length with the patient today.  Concerns regarding medicines are outlined above.  No orders of the defined types were placed in this encounter.  No orders of the defined types were placed in this encounter.   Patient  Instructions  Follow-Up: At [facility], you and your health needs are our priority.  As part of our continuing mission to provide you with exceptional heart care, we have created designated Provider Care Teams.  These Care Teams include your primary Cardiologist (physician) and Advanced Practice Providers (APPs -  Physician Assistants and Nurse Practitioners) who all work together to provide you with the care you need, when you need it.  We recommend signing up for the patient portal called "MyChart".  Sign up information is provided on this After Visit Summary.  MyChart is used to connect with patients for Virtual Visits (Telemedicine).  Patients are able to view lab/test  results, encounter notes, upcoming appointments, etc.  Non-urgent messages can be sent to your provider as well.   To learn more about what you can do with MyChart, go to [email or web address].    Your next appointment:   6 month(s)  Provider:   [name], MD      Signed, [name], MD  10/13/2023 10:21 AM    [facility]

## 2023-10-10 NOTE — H&P (View-Only) (Signed)
Cardiology Office Note:    Date:  10/13/2023   ID:  [name], DOB [birth year], MRN [id number]  PCP:  [name], MD   [facility] Providers Cardiologist:  [name], MD Sleep Medicine:  Nicki Guadalajara, MD     Referring MD: [name], MD   Chief Complaint  Patient presents with   Shortness of Breath    History of Present Illness:    Thomas [name] is a 89 y.o. male presenting for follow-up of aortic stenosis.  I met this patient in April 2024 after Dr. Katrinka Blazing retired.  He has a history of permanent atrial fibrillation, chronic diastolic heart failure, stage IIIb chronic kidney disease, recurrent GU bleeding, metastatic prostate cancer, and indolent multiple myeloma.  At the time of his last visit, we discussed the severity of his aortic stenosis was at least in the moderately severe range and he also had moderate aortic insufficiency.  He favored a conservative approach because of mild symptoms, especially in the setting of his comorbid conditions.  He presents today for follow-up after a surveillance echocardiogram.  The patient now admits to modest progression of his shortness of breath with walking.  He also has increased fatigue.  He denies chest pain or pressure, lightheadedness, heart palpitations, or syncope.  No orthopnea or PND.  He has not had any recent bleeding problems.  His recent echocardiogram shows an LVEF of 60 to 65%, normal RV function, severe calcification and restriction of the aortic valve with moderate aortic regurgitation and findings consistent with severe paradoxical low-flow low gradient aortic stenosis.  The peak transaortic velocity is 3.5 m/s, mean gradient 26 mmHg, calculated aortic valve area 0.8 cm, dimensionless index 0.24, and stroke-volume index of 29.   Current Medications: Current Meds  Medication Sig   acetaminophen (TYLENOL) 500 MG tablet Take 1,000 mg by mouth as needed.    apixaban (ELIQUIS) 2.5 MG TABS tablet Take  1 tablet (2.5 mg total) by mouth 2 (two) times daily.   Cholecalciferol (VITAMIN D3) 50 MCG (2000 UT) capsule Take 1 capsule by mouth daily.    dorzolamide (TRUSOPT) 2 % ophthalmic solution 1 drop 2 (two) times daily.   dorzolamide-timolol (COSOPT) 22.3-6.8 MG/ML ophthalmic solution 1 drop 2 (two) times daily.   latanoprost (XALATAN) 0.005 % ophthalmic solution INSTILL 1 DROP INTO BOTH EYES EVERY DAY AT NIGHT   timolol (TIMOPTIC) 0.5 % ophthalmic solution 1 drop 2 (two) times daily.   [DISCONTINUED] hydrochlorothiazide (MICROZIDE) 12.5 MG capsule Take 1 capsule (12.5 mg total) by mouth daily.     Allergies:   Benadryl [diphenhydramine hcl], Ceftriaxone sodium, Contrast media [iodinated contrast media], Diphenhydramine hcl, Nsaids, Penicillins, Farxiga [dapagliflozin], Codeine, Ibuprofen, Levaquin [levofloxacin], Oxybutynin, Tramadol hcl, Chlorhexidine, and Furosemide   ROS:   Please see the history of present illness.    All other systems reviewed and are negative.  EKGs/Labs/Other Studies Reviewed:    The following studies were reviewed today: Cardiac Studies & Procedures      ECHOCARDIOGRAM  ECHOCARDIOGRAM COMPLETE 09/08/2023  Narrative ECHOCARDIOGRAM REPORT    Patient Name:   Thomas [name] Date of Exam: 09/08/2023 Medical Rec #:  [id number]         Height:       64.5 in Accession #:    [id number]        Weight:       166.5 lb Date of Birth:  [birth year]         BSA:  1.819 m Patient Age:    88 years          BP:           170/72 mmHg Patient Gender: M                 HR:           103 bpm. Exam Location:  [address]  Procedure: 2D Echo, Cardiac Doppler and Color Doppler  Indications:    I35.0 Nonrheumatic aortic (valve) stenosis  History:        Patient has prior history of Echocardiogram examinations, most recent 02/14/2023. CHF, Arrythmias:Atrial Fibrillation; Risk Factors:Hypertension, Dyslipidemia and Sleep Apnea. Sinus node dysfunction. Multiple myeloma.  Chronic kidney disease.  Sonographer:    Cathie Beams RCS Referring Phys: [id number] Neema Fluegge  IMPRESSIONS   1. Left ventricular ejection fraction, by estimation, is 60 to 65%. The left ventricle has normal function. The left ventricle has no regional wall motion abnormalities. There is moderate asymmetric left ventricular hypertrophy of the basal-septal segment. Left ventricular diastolic parameters are indeterminate. 2. Right ventricular systolic function is normal. The right ventricular size is normal. There is normal pulmonary artery systolic pressure. 3. Left atrial size was mildly dilated. 4. The mitral valve is normal in structure. Mild mitral valve regurgitation. 5. Aortic dilatation noted. There is dilatation of the aortic root, measuring 41 mm. There is dilatation of the ascending aorta, measuring 41 mm. 6. The aortic valve is calcified. There is severe calcifcation of the aortic valve. Aortic valve regurgitation is moderate. Severe aortic valve stenosis. Moderate AS by gradients (Vmax 3.5 m/s, MG ), severe by AVA (0.8cm^2) and DI (0.24). Low SV index (29cc/m^2), suspect paradoxical low flow low gradient severe AS  FINDINGS Left Ventricle: Left ventricular ejection fraction, by estimation, is 60 to 65%. The left ventricle has normal function. The left ventricle has no regional wall motion abnormalities. The left ventricular internal cavity size was normal in size. There is moderate asymmetric left ventricular hypertrophy of the basal-septal segment. Left ventricular diastolic parameters are indeterminate.  Right Ventricle: The right ventricular size is normal. No increase in right ventricular wall thickness. Right ventricular systolic function is normal. There is normal pulmonary artery systolic pressure. The tricuspid regurgitant velocity is 2.17 m/s, and with an assumed right atrial pressure of 8 mmHg, the estimated right ventricular systolic pressure is 26.8 mmHg.  Left  Atrium: Left atrial size was mildly dilated.  Right Atrium: Right atrial size was normal in size.  Pericardium: There is no evidence of pericardial effusion.  Mitral Valve: The mitral valve is normal in structure. Mild mitral valve regurgitation.  Tricuspid Valve: The tricuspid valve is normal in structure. Tricuspid valve regurgitation is trivial.  Aortic Valve: The aortic valve is calcified. There is severe calcifcation of the aortic valve. Aortic valve regurgitation is moderate. Aortic regurgitation PHT measures 416 msec. Severe aortic stenosis is present. Aortic valve mean gradient measures 23.7 mmHg. Aortic valve peak gradient measures 42.9 mmHg. Aortic valve area, by VTI measures 0.84 cm.  Pulmonic Valve: The pulmonic valve was not well visualized. Pulmonic valve regurgitation is not visualized.  Aorta: Aortic dilatation noted. There is dilatation of the aortic root, measuring 41 mm. There is dilatation of the ascending aorta, measuring 41 mm.  IAS/Shunts: The interatrial septum was not well visualized.   LEFT VENTRICLE PLAX 2D LVIDd:         3.90 cm LVIDs:         2.40 cm LV  PW:         1.00 cm LV IVS:        1.20 cm LVOT diam:     2.10 cm LV SV:         51 LV SV Index:   28 LVOT Area:     3.46 cm   RIGHT VENTRICLE TAPSE (M-mode): 1.6 cm RVSP:           21.8 mmHg  LEFT ATRIUM             Index        RIGHT ATRIUM           Index LA diam:        4.30 cm 2.36 cm/m   RA Pressure: 3.00 mmHg LA Vol (A2C):   82.5 ml 45.34 ml/m  RA Area:     12.90 cm LA Vol (A4C):   50.2 ml 27.59 ml/m  RA Volume:   26.30 ml  14.46 ml/m LA Biplane Vol: 69.1 ml 37.98 ml/m AORTIC VALVE AV Area (Vmax):    0.80 cm AV Area (Vmean):   0.77 cm AV Area (VTI):     0.84 cm AV Vmax:           327.33 cm/s AV Vmean:          224.000 cm/s AV VTI:            0.606 m AV Peak Grad:      42.9 mmHg AV Mean Grad:      23.7 mmHg LVOT Vmax:         75.30 cm/s LVOT Vmean:        50.000  cm/s LVOT VTI:          0.147 m LVOT/AV VTI ratio: 0.24 AI PHT:            416 msec  AORTA Ao Root diam: 4.10 cm Ao Asc diam:  4.10 cm  TRICUSPID VALVE TR Peak grad:   18.8 mmHg TR Vmax:        217.00 cm/s Estimated RAP:  3.00 mmHg RVSP:           21.8 mmHg  SHUNTS Systemic VTI:  0.15 m Systemic Diam: 2.10 cm  Epifanio Lesches MD Electronically signed by Epifanio Lesches MD Signature Date/Time: 09/08/2023/3:56:10 PM    Final   MONITORS  LONG TERM MONITOR (3-14 DAYS) 09/27/2022  Narrative   Continuous atrial fibrillation / Atrial flutter with ave HR81 bpm and range 52-124 bpm   Isolated 7 beat WCT at 162 bpm.   PVC burden < 1%.   Patch Wear Time:  6 days and 23 hours (2023-12-13T08:47:09-499 to 2023-12-20T08:36:16-0500)  1 run of Ventricular Tachycardia occurred lasting 7 beats with a max rate of 162 bpm (avg 122 bpm). Atrial Flutter occurred continuously (100% burden), ranging from 52-124 bpm (avg of 81 bpm). Isolated VEs were rare (<1.0%, 3866), VE Couplets were rare (<1.0%, 67), and VE Triplets were rare (<1.0%, 5). Ventricular Bigeminy and Trigeminy were present.           EKG:        Recent Labs: 07/18/2023: ALT 12; BUN 27; Creatinine 1.36; Hemoglobin 14.9; Platelet Count 159; Potassium 4.2; Sodium 139  Recent Lipid Panel    Component Value Date/Time   CHOL 164 02/25/2020 0842   TRIG 131 02/25/2020 0842   HDL 41 02/25/2020 0842   CHOLHDL 4.0 02/25/2020 0842   LDLCALC 100 (H) 02/25/2020 1914  Physical Exam:    VS:  BP (!) 150/82   Pulse 63   Ht 5' 4.75" (1.645 m)   Wt 166 lb 6.4 oz (75.5 kg)   SpO2 97%   BMI 27.90 kg/m     Wt Readings from Last 3 Encounters:  10/10/23 166 lb 6.4 oz (75.5 kg)  07/25/23 166 lb 8 oz (75.5 kg)  01/23/23 165 lb 14.4 oz (75.3 kg)     GEN:  Well nourished, well developed pleasant elderly male in no acute distress HEENT: Normal NECK: No JVD; No carotid bruits LYMPHATICS: No  lymphadenopathy CARDIAC: Irregularly irregular, 3/6 harsh crescendo decrescendo murmur at the right upper sternal border with absent A2 RESPIRATORY:  Clear to auscultation without rales, wheezing or rhonchi  ABDOMEN: Soft, non-tender, non-distended MUSCULOSKELETAL:  No edema; No deformity  SKIN: Warm and dry NEUROLOGIC:  Alert and oriented x 3 PSYCHIATRIC:  Normal affect   Assessment & Plan Nonrheumatic aortic (valve) stenosis The patient has severe, paradoxical low-flow low gradient aortic stenosis.  By echo there has been some further progression of his aortic stenosis based on the parameters outlined above in the HPI.  The patient has NYHA functional class II symptoms of exertional dyspnea and fatigue.  We again reviewed the natural history of aortic stenosis, the progressive nature of the disease, and potential treatment options.  His treatment options would be limited to either palliative medical therapy or TAVR.  I do not think he be a candidate for surgical intervention under any circumstances.  He initially wanted to discuss things further with his family before proceeding with any TAVR evaluation, but he called back after making a decision to proceed with further workup.  We discussed that he would need diagnostic cardiac catheterization, both right and left heart catheterization. I have reviewed the risks, indications, and alternatives to cardiac catheterization, possible angioplasty, and stenting with the patient. Risks include but are not limited to bleeding, infection, vascular injury, stroke, myocardial infection, arrhythmia, kidney injury, radiation-related injury in the case of prolonged fluoroscopy use, emergency cardiac surgery, and death. The patient understands the risks of serious complication is 1-2 in 1000 with diagnostic cardiac cath and 1-2% or less with angioplasty/stenting.  He will also require a gated CTA of the heart and a CTA of the chest, abdomen, and pelvis.  Once his  studies are completed he will be referred for cardiac surgical consultation as part of a multidisciplinary approach to his care.  His most recent labs show stable renal function and I think he will be an appropriate candidate for catheter angiography with limited contrast as well as renal protocol CTA studies. Persistent atrial fibrillation (HCC) Anticoagulated with apixaban.  This will be held before cardiac catheterization per protocol. Essential hypertension Blood pressure is controlled       Informed Consent   Shared Decision Making/Informed Consent The risks [stroke (1 in 1000), death (1 in 1000), kidney failure [usually temporary] (1 in 500), bleeding (1 in 200), allergic reaction [possibly serious] (1 in 200)], benefits (diagnostic support and management of coronary artery disease) and alternatives of a cardiac catheterization were discussed in detail with Mr. Guedes and he is willing to proceed.       Medication Adjustments/Labs and Tests Ordered: Current medicines are reviewed at length with the patient today.  Concerns regarding medicines are outlined above.  No orders of the defined types were placed in this encounter.  No orders of the defined types were placed in this encounter.   Patient  Instructions  Follow-Up: At [facility], you and your health needs are our priority.  As part of our continuing mission to provide you with exceptional heart care, we have created designated Provider Care Teams.  These Care Teams include your primary Cardiologist (physician) and Advanced Practice Providers (APPs -  Physician Assistants and Nurse Practitioners) who all work together to provide you with the care you need, when you need it.  We recommend signing up for the patient portal called "MyChart".  Sign up information is provided on this After Visit Summary.  MyChart is used to connect with patients for Virtual Visits (Telemedicine).  Patients are able to view lab/test  results, encounter notes, upcoming appointments, etc.  Non-urgent messages can be sent to your provider as well.   To learn more about what you can do with MyChart, go to [email or web address].    Your next appointment:   6 month(s)  Provider:   [name], MD      Signed, [name], MD  10/13/2023 10:21 AM    [facility]

## 2023-10-12 ENCOUNTER — Telehealth: Payer: Self-pay | Admitting: Cardiovascular Disease

## 2023-10-12 NOTE — Telephone Encounter (Signed)
 New Message:    Patient wants Dr Excell Seltzer to know that he have decided to have the procedure that they discussed yesterday(10-11-23). He says he wants it asap please.

## 2023-10-13 ENCOUNTER — Other Ambulatory Visit: Payer: Self-pay | Admitting: Physician Assistant

## 2023-10-13 ENCOUNTER — Encounter: Payer: Self-pay | Admitting: Physician Assistant

## 2023-10-13 ENCOUNTER — Encounter: Payer: Self-pay | Admitting: Cardiovascular Disease

## 2023-10-13 DIAGNOSIS — I350 Nonrheumatic aortic (valve) stenosis: Secondary | ICD-10-CM

## 2023-10-13 DIAGNOSIS — N1832 Chronic kidney disease, stage 3b: Secondary | ICD-10-CM

## 2023-10-13 DIAGNOSIS — N3000 Acute cystitis without hematuria: Secondary | ICD-10-CM | POA: Diagnosis not present

## 2023-10-13 DIAGNOSIS — R310 Gross hematuria: Secondary | ICD-10-CM | POA: Diagnosis not present

## 2023-10-13 NOTE — Telephone Encounter (Signed)
 Thx I'll add this to my note working on it now

## 2023-10-13 NOTE — Telephone Encounter (Signed)
 Cath set up for 1/29 with Dr. Excell Seltzer

## 2023-10-13 NOTE — Assessment & Plan Note (Signed)
 Anticoagulated with apixaban.  This will be held before cardiac catheterization per protocol.

## 2023-10-16 DIAGNOSIS — I350 Nonrheumatic aortic (valve) stenosis: Secondary | ICD-10-CM | POA: Diagnosis not present

## 2023-10-16 DIAGNOSIS — N1832 Chronic kidney disease, stage 3b: Secondary | ICD-10-CM | POA: Diagnosis not present

## 2023-10-17 LAB — BASIC METABOLIC PANEL
BUN/Creatinine Ratio: 16 (ref 10–24)
BUN: 23 mg/dL (ref 8–27)
CO2: 24 mmol/L (ref 20–29)
Calcium: 9.9 mg/dL (ref 8.6–10.2)
Chloride: 106 mmol/L (ref 96–106)
Creatinine, Ser: 1.45 mg/dL — ABNORMAL HIGH (ref 0.76–1.27)
Glucose: 100 mg/dL — ABNORMAL HIGH (ref 70–99)
Potassium: 4.5 mmol/L (ref 3.5–5.2)
Sodium: 139 mmol/L (ref 134–144)
eGFR: 47 mL/min/{1.73_m2} — ABNORMAL LOW (ref >59)

## 2023-10-17 LAB — CBC
Hematocrit: 43.4 % (ref 37.5–51.0)
Hemoglobin: 14.3 g/dL (ref 13.0–17.7)
MCH: 31.2 pg (ref 26.6–33.0)
MCHC: 32.9 g/dL (ref 31.5–35.7)
MCV: 95 fL (ref 79–97)
Platelets: 180 10*3/uL (ref 150–450)
RBC: 4.58 x10E6/uL (ref 4.14–5.80)
RDW: 14.1 % (ref 11.6–15.4)
WBC: 6.2 10*3/uL (ref 3.4–10.8)

## 2023-10-26 DIAGNOSIS — M25561 Pain in right knee: Secondary | ICD-10-CM | POA: Diagnosis not present

## 2023-10-26 DIAGNOSIS — M170 Bilateral primary osteoarthritis of knee: Secondary | ICD-10-CM | POA: Diagnosis not present

## 2023-10-31 ENCOUNTER — Telehealth: Payer: Self-pay | Admitting: *Deleted

## 2023-10-31 NOTE — Telephone Encounter (Signed)
Patient reports has not had an allergic reaction to IV contrast in the past.

## 2023-10-31 NOTE — Telephone Encounter (Signed)
Cardiac Catheterization scheduled at [facility] for: Wednesday November 01, 2023 10 AM Arrival time [facility] Main Entrance A at: 8 AM  Nothing to eat after midnight prior to procedure, clear liquids until 5 AM day of procedure.  Medication instructions: -Hold:  Eliquis-pt reports last dose AM 10/29/23 until post procedure -Other usual morning medications can be taken with sips of water including aspirin 81 mg.  Plan to go home the same day, you will only stay overnight if medically necessary.  You must have responsible adult to drive you home.  Someone must be with you the first 24 hours after you arrive home.  Reviewed procedure instructions with patient

## 2023-11-01 ENCOUNTER — Encounter (HOSPITAL_COMMUNITY): Payer: Self-pay | Admitting: Cardiovascular Disease

## 2023-11-01 ENCOUNTER — Ambulatory Visit (HOSPITAL_COMMUNITY)
Admission: RE | Admit: 2023-11-01 | Discharge: 2023-11-01 | Disposition: A | Discharge status: Home / Self Care | Payer: Medicare Other | Attending: Cardiovascular Disease | Admitting: Cardiovascular Disease

## 2023-11-01 ENCOUNTER — Encounter (HOSPITAL_COMMUNITY)
Admission: RE | Disposition: A | Discharge status: Home / Self Care | Payer: Self-pay | Source: Home / Self Care | Attending: Cardiovascular Disease

## 2023-11-01 ENCOUNTER — Other Ambulatory Visit: Payer: Self-pay

## 2023-11-01 DIAGNOSIS — I4821 Permanent atrial fibrillation: Secondary | ICD-10-CM | POA: Insufficient documentation

## 2023-11-01 DIAGNOSIS — Z8546 Personal history of malignant neoplasm of prostate: Secondary | ICD-10-CM | POA: Insufficient documentation

## 2023-11-01 DIAGNOSIS — I495 Sick sinus syndrome: Secondary | ICD-10-CM | POA: Diagnosis not present

## 2023-11-01 DIAGNOSIS — I350 Nonrheumatic aortic (valve) stenosis: Secondary | ICD-10-CM | POA: Insufficient documentation

## 2023-11-01 DIAGNOSIS — Z7901 Long term (current) use of anticoagulants: Secondary | ICD-10-CM | POA: Insufficient documentation

## 2023-11-01 DIAGNOSIS — I5032 Chronic diastolic (congestive) heart failure: Secondary | ICD-10-CM | POA: Insufficient documentation

## 2023-11-01 DIAGNOSIS — R0602 Shortness of breath: Secondary | ICD-10-CM | POA: Diagnosis not present

## 2023-11-01 DIAGNOSIS — N1832 Chronic kidney disease, stage 3b: Secondary | ICD-10-CM | POA: Diagnosis not present

## 2023-11-01 DIAGNOSIS — I130 Hypertensive heart and chronic kidney disease with heart failure and stage 1 through stage 4 chronic kidney disease, or unspecified chronic kidney disease: Secondary | ICD-10-CM | POA: Insufficient documentation

## 2023-11-01 HISTORY — PX: RIGHT/LEFT HEART CATH AND CORONARY ANGIOGRAPHY: CATH118266

## 2023-11-01 LAB — POCT I-STAT EG7
Acid-base deficit: 4 mmol/L — ABNORMAL HIGH (ref 0.0–2.0)
Acid-base deficit: 4 mmol/L — ABNORMAL HIGH (ref 0.0–2.0)
Bicarbonate: 21.9 mmol/L (ref 20.0–28.0)
Bicarbonate: 22 mmol/L (ref 20.0–28.0)
Calcium, Ion: 1.34 mmol/L (ref 1.15–1.40)
Calcium, Ion: 1.34 mmol/L (ref 1.15–1.40)
HCT: 38 % — ABNORMAL LOW (ref 39.0–52.0)
HCT: 38 % — ABNORMAL LOW (ref 39.0–52.0)
Hemoglobin: 12.9 g/dL — ABNORMAL LOW (ref 13.0–17.0)
Hemoglobin: 12.9 g/dL — ABNORMAL LOW (ref 13.0–17.0)
O2 Saturation: 60 %
O2 Saturation: 61 %
Potassium: 3.9 mmol/L (ref 3.5–5.1)
Potassium: 3.9 mmol/L (ref 3.5–5.1)
Sodium: 142 mmol/L (ref 135–145)
Sodium: 142 mmol/L (ref 135–145)
TCO2: 23 mmol/L (ref 22–32)
TCO2: 23 mmol/L (ref 22–32)
pCO2, Ven: 42 mm[Hg] — ABNORMAL LOW (ref 44–60)
pCO2, Ven: 42.3 mm[Hg] — ABNORMAL LOW (ref 44–60)
pH, Ven: 7.324 (ref 7.25–7.43)
pH, Ven: 7.326 (ref 7.25–7.43)
pO2, Ven: 34 mm[Hg] (ref 32–45)
pO2, Ven: 34 mm[Hg] (ref 32–45)

## 2023-11-01 LAB — POCT I-STAT 7, (LYTES, BLD GAS, ICA,H+H)
Acid-base deficit: 5 mmol/L — ABNORMAL HIGH (ref 0.0–2.0)
Bicarbonate: 20.1 mmol/L (ref 20.0–28.0)
Calcium, Ion: 1.36 mmol/L (ref 1.15–1.40)
HCT: 39 % (ref 39.0–52.0)
Hemoglobin: 13.3 g/dL (ref 13.0–17.0)
O2 Saturation: 96 %
Potassium: 4 mmol/L (ref 3.5–5.1)
Sodium: 141 mmol/L (ref 135–145)
TCO2: 21 mmol/L — ABNORMAL LOW (ref 22–32)
pCO2 arterial: 35.6 mm[Hg] (ref 32–48)
pH, Arterial: 7.36 (ref 7.35–7.45)
pO2, Arterial: 87 mm[Hg] (ref 83–108)

## 2023-11-01 SURGERY — RIGHT/LEFT HEART CATH AND CORONARY ANGIOGRAPHY
Anesthesia: LOCAL

## 2023-11-01 MED ORDER — SODIUM CHLORIDE 0.9% FLUSH
3.0000 mL | Freq: Two times a day (BID) | INTRAVENOUS | Status: DC
Start: 2023-11-01 — End: 2023-11-01

## 2023-11-01 MED ORDER — ONDANSETRON HCL 4 MG/2ML IJ SOLN: 4.0000 mg | Freq: Four times a day (QID) | INTRAMUSCULAR | Status: DC | PRN

## 2023-11-01 MED ORDER — SODIUM CHLORIDE 0.9 % WEIGHT BASED INFUSION
3.0000 mL/kg/h | INTRAVENOUS | Status: AC
  Administered 2023-11-01: 3 mL/kg/h via INTRAVENOUS

## 2023-11-01 MED ORDER — SODIUM CHLORIDE 0.9 % WEIGHT BASED INFUSION: 1.0000 mL/kg/h | INTRAVENOUS | Status: DC

## 2023-11-01 MED ORDER — VERAPAMIL HCL 2.5 MG/ML IV SOLN
INTRAVENOUS | Status: DC | PRN
  Administered 2023-11-01: 10 mL via INTRA_ARTERIAL

## 2023-11-01 MED ORDER — SODIUM CHLORIDE 0.9 % IV SOLN: INTRAVENOUS | Status: DC

## 2023-11-01 MED ORDER — HYDRALAZINE HCL 20 MG/ML IJ SOLN: 10.0000 mg | INTRAMUSCULAR | Status: DC | PRN

## 2023-11-01 MED ORDER — ASPIRIN 81 MG PO CHEW: 81.0000 mg | CHEWABLE_TABLET | ORAL | Status: DC

## 2023-11-01 MED ORDER — IOHEXOL 350 MG/ML SOLN
INTRAVENOUS | Status: DC | PRN
  Administered 2023-11-01: 18 mL via INTRA_ARTERIAL

## 2023-11-01 MED ORDER — SODIUM CHLORIDE 0.9% FLUSH: 3.0000 mL | INTRAVENOUS | Status: DC | PRN

## 2023-11-01 MED ORDER — HEPARIN (PORCINE) IN NACL 1000-0.9 UT/500ML-% IV SOLN
INTRAVENOUS | Status: DC | PRN
  Administered 2023-11-01 (×2): 500 mL via INTRA_ARTERIAL

## 2023-11-01 MED ORDER — FENTANYL CITRATE (PF) 100 MCG/2ML IJ SOLN
INTRAMUSCULAR | Status: AC
  Filled 2023-11-01: qty 2

## 2023-11-01 MED ORDER — MIDAZOLAM HCL 2 MG/2ML IJ SOLN
INTRAMUSCULAR | Status: AC
  Filled 2023-11-01: qty 2

## 2023-11-01 MED ORDER — FENTANYL CITRATE (PF) 100 MCG/2ML IJ SOLN
INTRAMUSCULAR | Status: DC | PRN
  Administered 2023-11-01: 25 ug via INTRAVENOUS

## 2023-11-01 MED ORDER — LABETALOL HCL 5 MG/ML IV SOLN: 10.0000 mg | INTRAVENOUS | Status: DC | PRN

## 2023-11-01 MED ORDER — METOPROLOL TARTRATE 50 MG PO TABS: ORAL_TABLET | ORAL | 0 refills | Status: DC

## 2023-11-01 MED ORDER — MIDAZOLAM HCL 2 MG/2ML IJ SOLN
INTRAMUSCULAR | Status: DC | PRN
  Administered 2023-11-01: 1 mg via INTRAVENOUS

## 2023-11-01 MED ORDER — LIDOCAINE HCL (PF) 1 % IJ SOLN
INTRAMUSCULAR | Status: AC
  Filled 2023-11-01: qty 30

## 2023-11-01 MED ORDER — ACETAMINOPHEN 325 MG PO TABS
650.0000 mg | ORAL_TABLET | ORAL | Status: DC | PRN
Start: 2023-11-01 — End: 2023-11-01

## 2023-11-01 MED ORDER — HEPARIN SODIUM (PORCINE) 1000 UNIT/ML IJ SOLN
INTRAMUSCULAR | Status: AC
  Filled 2023-11-01: qty 10

## 2023-11-01 MED ORDER — SODIUM CHLORIDE 0.9 % IV SOLN: 250.0000 mL | INTRAVENOUS | Status: DC | PRN

## 2023-11-01 MED ORDER — HEPARIN SODIUM (PORCINE) 1000 UNIT/ML IJ SOLN
INTRAMUSCULAR | Status: DC | PRN
  Administered 2023-11-01: 4000 [IU] via INTRAVENOUS

## 2023-11-01 MED ORDER — VERAPAMIL HCL 2.5 MG/ML IV SOLN
INTRAVENOUS | Status: AC
  Filled 2023-11-01: qty 2

## 2023-11-01 MED ORDER — LIDOCAINE HCL (PF) 1 % IJ SOLN
INTRAMUSCULAR | Status: DC | PRN
  Administered 2023-11-01 (×2): 2 mL

## 2023-11-01 SURGICAL SUPPLY — 10 items
CATH BALLN WEDGE 5F 110CM (CATHETERS) IMPLANT
CATH INFINITI 5 FR JL3.5 (CATHETERS) IMPLANT
CATH INFINITI JR4 5F (CATHETERS) IMPLANT
DEVICE RAD COMP TR BAND LRG (VASCULAR PRODUCTS) IMPLANT
GLIDESHEATH SLEND SS 6F .021 (SHEATH) IMPLANT
GUIDEWIRE INQWIRE 1.5J.035X260 (WIRE) IMPLANT
INQWIRE 1.5J .035X260CM (WIRE) ×1 IMPLANT
PACK CARDIAC CATHETERIZATION (CUSTOM PROCEDURE TRAY) ×1 IMPLANT
SET ATX-X65L (MISCELLANEOUS) IMPLANT
SHEATH GLIDE SLENDER 4/5FR (SHEATH) IMPLANT

## 2023-11-01 NOTE — Discharge Instructions (Signed)

## 2023-11-01 NOTE — Interval H&P Note (Signed)
History and Physical Interval Note:  11/01/2023 11:07 AM  Thomas [name]  has presented today for surgery, with the diagnosis of aortic stenosis.  The various methods of treatment have been discussed with the patient and family. After consideration of risks, benefits and other options for treatment, the patient has consented to  Procedure(s): RIGHT/LEFT HEART CATH AND CORONARY ANGIOGRAPHY (N/A) as a surgical intervention.  The patient's history has been reviewed, patient examined, no change in status, stable for surgery.  I have reviewed the patient's chart and labs.  Questions were answered to the patient's satisfaction.     [name]

## 2023-11-13 NOTE — Progress Notes (Signed)
 STS Score Procedure Type: Isolated AVR Perioperative Outcome Estimate % Operative Mortality 3.97% Morbidity & Mortality 9.99% Stroke 1.34% Renal Failure 1.79% Reoperation 4.28% Prolonged Ventilation 4.6% Deep Sternal Wound Infection 0.044% Long Hospital Stay (>14 days) 4.49% Short Hospital Stay (<6 days)* 39.7%

## 2023-11-14 ENCOUNTER — Ambulatory Visit (HOSPITAL_COMMUNITY)
Admission: RE | Admit: 2023-11-14 | Discharge: 2023-11-14 | Disposition: A | Discharge status: Home / Self Care | Payer: Medicare Other | Source: Ambulatory Visit | Attending: Family Medicine | Admitting: Family Medicine

## 2023-11-14 DIAGNOSIS — I350 Nonrheumatic aortic (valve) stenosis: Secondary | ICD-10-CM | POA: Diagnosis not present

## 2023-11-14 DIAGNOSIS — Z48812 Encounter for surgical aftercare following surgery on the circulatory system: Secondary | ICD-10-CM | POA: Diagnosis not present

## 2023-11-14 DIAGNOSIS — N281 Cyst of kidney, acquired: Secondary | ICD-10-CM | POA: Diagnosis not present

## 2023-11-14 DIAGNOSIS — I77810 Thoracic aortic ectasia: Secondary | ICD-10-CM | POA: Diagnosis not present

## 2023-11-14 DIAGNOSIS — K5730 Diverticulosis of large intestine without perforation or abscess without bleeding: Secondary | ICD-10-CM | POA: Diagnosis not present

## 2023-11-14 MED ORDER — IOHEXOL 350 MG/ML SOLN
95.0000 mL | Freq: Once | INTRAVENOUS | Status: AC | PRN
  Administered 2023-11-14: 95 mL via INTRAVENOUS

## 2023-11-16 ENCOUNTER — Encounter: Payer: Self-pay | Admitting: Thoracic Surgery (Cardiothoracic Vascular Surgery)

## 2023-11-16 ENCOUNTER — Institutional Professional Consult (permissible substitution): Payer: Medicare Other | Admitting: Thoracic Surgery (Cardiothoracic Vascular Surgery)

## 2023-11-16 ENCOUNTER — Encounter: Payer: Medicare Other | Admitting: Thoracic Surgery (Cardiothoracic Vascular Surgery)

## 2023-11-16 VITALS — BP 154/79 | HR 90 | Resp 20 | Ht 64.5 in | Wt 165.0 lb

## 2023-11-16 DIAGNOSIS — I350 Nonrheumatic aortic (valve) stenosis: Secondary | ICD-10-CM

## 2023-11-16 NOTE — Progress Notes (Signed)
 [address]       [place] [address]             [phone]           DARRELLE WIBERG [facility] Record #[id number] Date of Birth: November 29, 1934  [name], MD [name], MD  Chief Complaint:   Increasing DOE  History of Present Illness:     Pt is a very pleasant 89 yo male who has been followed for moderate AS in the past. Pt has recently seen Dr Excell Seltzer and screening echo with now severe paradoxical low flow low gradient AS with a mean gradient of and a valve area of 0.8cm2 with a very highly calcified and restrictive valve on echo. He has a normal EF of 60%.  He underwent cath without CAD and recent CTA by my assessment has femoral access and size 29 sapien valve. He has multiple comorbidities with chronic atrial fibrillation, CKD, metastatic prostate cancer, multiple myeloma.       Past Medical History:  Diagnosis Date   BPH (benign prostatic hypertrophy)    Chronic diastolic heart failure (HCC) 12/27/2013   Chronic kidney disease    Hx of bilateral renal cysts.   Encounter for antineoplastic chemotherapy 02/20/2018   Glaucoma    Heart murmur    History of atrial fibrillation 03/09/09   Hx of epiglottitis    2011   Hypercholesterolemia    Hypertension    Long term current use of amiodarone 11/07/2013   Amiodarone    Mild aortic stenosis    OSA on CPAP 06/26/2014   Paroxysmal atrial fibrillation (HCC) 11/07/2013   Prostate cancer (HCC) 7/12   T1C Ad Ca  s/p TURP, observation only   Renal insufficiency 09/06/2011   Sinus node dysfunction (HCC) 08/14/2014   Sleep apnea    Smoldering multiple myeloma     Past Surgical History:  Procedure Laterality Date   ADRENALECTOMY  02/12/11   Left, for Pheochromocytoma   CARDIOVERSION N/A 12/26/2013   Procedure: CARDIOVERSION;  Surgeon: Pricilla Riffle, MD;  Location: [facility] ENDOSCOPY;  Service: Cardiovascular;  Laterality: N/A;   CHOLECYSTECTOMY  02/12/11   GASTRECTOMY  1984   S/P gastrectomy  for ulcer   KNEE SURGERY  1971   S/P Right knee surgery   RIGHT/LEFT HEART CATH AND CORONARY ANGIOGRAPHY N/A 11/01/2023   Procedure: RIGHT/LEFT HEART CATH AND CORONARY ANGIOGRAPHY;  Surgeon: [name], MD;  Location: [facility] INVASIVE CV LAB;  Service: Cardiovascular;  Laterality: N/A;   TEE WITHOUT CARDIOVERSION N/A 12/26/2013   Procedure: TRANSESOPHAGEAL ECHOCARDIOGRAM (TEE);  Surgeon: Pricilla Riffle, MD;  Location[facility] ENDOSCOPY;  Service: Cardiovascular;  Laterality: N/A;   TRANSURETHRAL RESECTION OF PROSTATE  7/12    Social History   Tobacco Use  Smoking Status Never  Smokeless Tobacco Never    Social History   Substance and Sexual Activity  Alcohol Use No    Social History   Socioeconomic History   Marital status: Married    Spouse name: Not on file   Number of children: 3   Years of education: Not on file   Highest education level: Not on file  Occupational History    Employer: RETIRED  Tobacco Use   Smoking status: Never   Smokeless tobacco: Never  Vaping Use   Vaping status: Never Used  Substance and Sexual Activity   Alcohol use: No   Drug use: No   Sexual activity: Not Currently  Other  Topics Concern   Not on file  Social History Narrative   Not on file   Social Drivers of Health   Financial Resource Strain: Not on file  Food Insecurity: Not on file  Transportation Needs: Not on file  Physical Activity: Not on file  Stress: Not on file  Social Connections: Not on file  Intimate Partner Violence: Not on file    Allergies  Allergen Reactions   Benadryl [Diphenhydramine Hcl] Other (See Comments)    Bad dreams -  Hallucinations.   Ceftriaxone Sodium Hives   Contrast Media [Iodinated Contrast Media] Other (See Comments)    Poor kidney function   Nsaids Other (See Comments)    REACTION: BLEEDING ULCER   Penicillins Hives   Farxiga [Dapagliflozin] Rash   Codeine     loopy   Doxycycline Nausea And Vomiting    Stomach pain   Ibuprofen Hives    Levaquin [Levofloxacin]     Unknown reaction    Oxybutynin Other (See Comments)    Lightheaded, dizzy, unbalanced. "Disturbed my equilibrium."   Tramadol Hcl     Other reaction(s): dizzy/nauseated   Chlorhexidine Rash    Use alcohol for skin prep per patient request.    Furosemide Diarrhea, Itching and Rash    Current Outpatient Medications  Medication Sig Dispense Refill   acetaminophen (TYLENOL) 500 MG tablet Take 1,000 mg by mouth 2 (two) times daily.     apixaban (ELIQUIS) 2.5 MG TABS tablet Take 1 tablet (2.5 mg total) by mouth 2 (two) times daily. 180 tablet 1   cholecalciferol (VITAMIN D3) 25 MCG (1000 UNIT) tablet Take 1,000 Units by mouth daily.     dorzolamide-timolol (COSOPT) 22.3-6.8 MG/ML ophthalmic solution Place 1 drop into both eyes 2 (two) times daily.     latanoprost (XALATAN) 0.005 % ophthalmic solution INSTILL 1 DROP INTO BOTH EYES EVERY DAY AT NIGHT  6   metoprolol tartrate (LOPRESSOR) 50 MG tablet Take one tablet by mouth at 8:30 AM on 11/14/2023 prior to CT scan 1 tablet 0   No current facility-administered medications for this visit.     Family History  Problem Relation Age of Onset   Prostate cancer Father    Prostate cancer Brother    Polymyositis Mother    Liver disease Sister    Liver disease Sister    Liver disease Sister    Melanoma Brother    Kidney Stones Brother        Physical Exam: Healthy appearing Lungs; clear Card: IRR with harsh systolic murmur Ext: no edema Neuro intact     Diagnostic Studies & Laboratory data: I have personally reviewed the following studies and agree with the findings   TTE (09/2023) IMPRESSIONS     1. Left ventricular ejection fraction, by estimation, is 60 to 65%. The  left ventricle has normal function. The left ventricle has no regional  wall motion abnormalities. There is moderate asymmetric left ventricular  hypertrophy of the basal-septal  segment. Left ventricular diastolic parameters are  indeterminate.   2. Right ventricular systolic function is normal. The right ventricular  size is normal. There is normal pulmonary artery systolic pressure.   3. Left atrial size was mildly dilated.   4. The mitral valve is normal in structure. Mild mitral valve  regurgitation.   5. Aortic dilatation noted. There is dilatation of the aortic root,  measuring 41 mm. There is dilatation of the ascending aorta, measuring 41  mm.   6. The aortic valve is  calcified. There is severe calcifcation of the  aortic valve. Aortic valve regurgitation is moderate. Severe aortic valve  stenosis. Moderate AS by gradients (Vmax 3.5 m/s, MG ), severe by  AVA (0.8cm^2) and DI (0.24). Low SV  index (29cc/m^2), suspect paradoxical low flow low gradient severe AS   FINDINGS   Left Ventricle: Left ventricular ejection fraction, by estimation, is 60  to 65%. The left ventricle has normal function. The left ventricle has no  regional wall motion abnormalities. The left ventricular internal cavity  size was normal in size. There is   moderate asymmetric left ventricular hypertrophy of the basal-septal  segment. Left ventricular diastolic parameters are indeterminate.   Right Ventricle: The right ventricular size is normal. No increase in  right ventricular wall thickness. Right ventricular systolic function is  normal. There is normal pulmonary artery systolic pressure. The tricuspid  regurgitant velocity is 2.17 m/s, and   with an assumed right atrial pressure of 8 mmHg, the estimated right  ventricular systolic pressure is 26.8 mmHg.   Left Atrium: Left atrial size was mildly dilated.   Right Atrium: Right atrial size was normal in size.   Pericardium: There is no evidence of pericardial effusion.   Mitral Valve: The mitral valve is normal in structure. Mild mitral valve  regurgitation.   Tricuspid Valve: The tricuspid valve is normal in structure. Tricuspid  valve regurgitation is trivial.    Aortic Valve: The aortic valve is calcified. There is severe calcifcation  of the aortic valve. Aortic valve regurgitation is moderate. Aortic  regurgitation PHT measures 416 msec. Severe aortic stenosis is present.  Aortic valve mean gradient measures 23.7   mmHg. Aortic valve peak gradient measures 42.9 mmHg. Aortic valve area,  by VTI measures 0.84 cm.   Pulmonic Valve: The pulmonic valve was not well visualized. Pulmonic valve  regurgitation is not visualized.   Aorta: Aortic dilatation noted. There is dilatation of the aortic root,  measuring 41 mm. There is dilatation of the ascending aorta, measuring 41  mm.   IAS/Shunts: The interatrial septum was not well visualized.     LEFT VENTRICLE  PLAX 2D  LVIDd:         3.90 cm  LVIDs:         2.40 cm  LV PW:         1.00 cm  LV IVS:        1.20 cm  LVOT diam:     2.10 cm  LV SV:         51  LV SV Index:   28  LVOT Area:     3.46 cm     RIGHT VENTRICLE  TAPSE (M-mode): 1.6 cm  RVSP:           21.8 mmHg   LEFT ATRIUM             Index        RIGHT ATRIUM           Index  LA diam:        4.30 cm 2.36 cm/m   RA Pressure: 3.00 mmHg  LA Vol (A2C):   82.5 ml 45.34 ml/m  RA Area:     12.90 cm  LA Vol (A4C):   50.2 ml 27.59 ml/m  RA Volume:   26.30 ml  14.46 ml/m  LA Biplane Vol: 69.1 ml 37.98 ml/m   AORTIC VALVE  AV Area (Vmax):    0.80 cm  AV Area (Vmean):  0.77 cm  AV Area (VTI):     0.84 cm  AV Vmax:           327.33 cm/s  AV Vmean:          224.000 cm/s  AV VTI:            0.606 m  AV Peak Grad:      42.9 mmHg  AV Mean Grad:      23.7 mmHg  LVOT Vmax:         75.30 cm/s  LVOT Vmean:        50.000 cm/s  LVOT VTI:          0.147 m  LVOT/AV VTI ratio: 0.24  AI PHT:            416 msec    AORTA  Ao Root diam: 4.10 cm  Ao Asc diam:  4.10 cm   TRICUSPID VALVE  TR Peak grad:   18.8 mmHg  TR Vmax:        217.00 cm/s  Estimated RAP:  3.00 mmHg  RVSP:           21.8 mmHg    SHUNTS  Systemic VTI:  0.15  m  Systemic Diam: 2.10 cm   CATH (10/2023) Conclusion      There is severe aortic valve stenosis.   1.  Widely patent coronary arteries with minimal irregularity present, no stenoses, right dominant coronary circulation 2.  Calcified and restricted aortic valve leaflets on plain fluoroscopy.  Mean transaortic gradient 23 mmHg, calculated aortic valve area by invasive gradient measurement is 0.89 cm. 3.  Normal right heart hemodynamics with preserved cardiac output, mean PA pressure 23 mmHg, wedge pressure 11 mmHg.    Recent Radiology Findings:   CT CORONARY MORPH W/CTA COR W/SCORE W/CA W/CM &/OR WO/CM Result Date: 11/14/2023 CLINICAL DATA:  Aortic Valve pathology with assessment for TAVR EXAM: Cardiac TAVR CT TECHNIQUE: The patient was scanned on a Siemens Force 192 slice scanner. A 120 kV retrospective scan was triggered in the descending thoracic aorta at 111 HU's. Gantry rotation speed was 270 msecs and collimation was .9 mm. No beta blockade or nitro were given. The 3D data set was reconstructed in 5% intervals of the R-R cycle. Systolic and diastolic phases were analyzed on a dedicated work station using MPR, MIP and VRT modes. The patient received 95 cc of contrast. FINDINGS: Aortic Valve: Severely thickened aortic valve with heavy calcification and reduced excursion the planimeter valve area is 1.15 sq cm consistent with moderate to severe aortic stenosis. Morphology: Functionally bicuspid with right-left fusion/calcified raphe. Annular calcification: None. Aortic Valve Calcium Score: 3049. Presence of basal septal hypertrophy: Non severe. Perimembranous septal diameter: 7 mm. Mitral Valve: No calcifications. Aortic Annulus Measurements- 20% phase Major annulus diameter: 32 mm. Minor annulus diameter: 29 mm. Annular perimeter: 94 mm. Annular area: 6.68 cm2. Aortic Measurements- 70% phase Sinotubular Junction: 37 mm. Ascending Thoracic Aorta: 43 mm. Aortic Arch: 37 mm. Descending Thoracic Aorta:  33 mm. Aortic atherosclerosis. Sinus of Valsalva Measurements: Right coronary cusp width: 40 mm Left coronary cusp width: 42 mm Non coronary cusp width: 40 mm Coronary Artery Height above Annulus: Left Main: 19 mm Left SoV height: 31 mm Right Coronary: 22 mm Right SoV height: 31 mm Optimum Fluoroscopic Angle for Delivery: LAO 9, CAU 7 Cusp overlay view angle: RAO 0, CAU 19 Valves for structural team consideration: Sapien 29 mm; upper limits for this valve type. Scene Saved for  structural review. Evolut 34 mm, upper limits for this valve type. Non TAVR Valve Findings: Coronary Arteries: Normal coronary origin. Study not completed with nitroglycerin. Coronary Calcium Score: Left main: 0 Left anterior descending artery: 9 Left circumflex artery: 216 Right coronary artery: 4 Total: 229 Percentile: 86th for age, sex, and race matched control. Systemic veins: Normal anatomy. Main Pulmonary artery: Mild dilation 30 mm Pulmonary veins: Normal anatomy Left atrial appendage: There is lack of contrast opacification of the distal left atrial appendage. In comparison with calcium score acquisition, mixing artifact favored, cannot exclude thrombus. Interatrial septum: No communication. Chamber dimensions: Right ventricular enlargement. Bi-atrial dilation Pericardium: No calcification. Extra Cardiac Findings as per separate reporting. Notable artifacts: Motion artifact, poor contrast opacification of the left side Image quality: Poor IMPRESSION: 1. Moderate to severe aortic stenosis. Findings pertinent to TAVR procedure are detailed above. 2.  Left atrial appendage lack of opacification noted. 3.  Mild aortic dilation. RECOMMENDATIONS: The proposed cut-off value of 1,651 AU yielded a 93 % sensitivity and 75 % specificity in grading AS severity in patients with classical low-flow, low-gradient AS. Proposed different cut-off values to define severe AS for men and women as 2,065 AU and 1,274 AU, respectively. The joint European and  American recommendations for the assessment of AS consider the aortic valve calcium score as a continuum - a very high calcium score suggests severe AS and a low calcium score suggests severe AS is unlikely. Sunday Shams, et al. 2017 ESC/EACTS Guidelines for the management of valvular heart disease. Eur Heart J [phone] Coronary artery calcium (CAC) score is a strong predictor of incident coronary heart disease (CHD) and provides predictive information beyond traditional risk factors. CAC scoring is reasonable to use in the decision to withhold, postpone, or initiate statin therapy in intermediate-risk or selected borderline-risk asymptomatic adults (age 7-75 years and LDL-C >=70 to <190 mg/dL) who do not have diabetes or established atherosclerotic cardiovascular disease (ASCVD).* In intermediate-risk (10-year ASCVD risk >=7.5% to <20%) adults or selected borderline-risk (10-year ASCVD risk >=5% to <7.5%) adults in whom a CAC score is measured for the purpose of making a treatment decision the following recommendations have been made: If CAC = 0, it is reasonable to withhold statin therapy and reassess in 5 to 10 years, as long as higher risk conditions are absent (diabetes mellitus, family history of premature CHD in first degree relatives (males <55 years; females <65 years), cigarette smoking, LDL >=190 mg/dL or other independent risk factors). If CAC is 1 to 99, it is reasonable to initiate statin therapy for patients >=70 years of age. If CAC is >=100 or >=75th percentile, it is reasonable to initiate statin therapy at any age. Cardiology referral should be considered for patients with CAC scores >=400 or >=75th percentile. *2018 AHA/ACC/AACVPR/AAPA/ABC/ACPM/ADA/AGS/APhA/ASPC/NLA/PCNA Guideline on the Management of Blood Cholesterol: A Report of the American College of Cardiology/American Heart Association Task Force on Clinical Practice Guidelines. J Am Coll Cardiol.  2019;73(24):3168-3209. Mahesh  Chandrasekhar Electronically Signed   By: Riley Lam M.D.   On: 11/14/2023 14:02      Recent Lab Findings: Lab Results  Component Value Date   WBC 6.2 10/16/2023   HGB 12.9 (L) 11/01/2023   HGB 12.9 (L) 11/01/2023   HCT 38.0 (L) 11/01/2023   HCT 38.0 (L) 11/01/2023   PLT 180 10/16/2023   GLUCOSE 100 (H) 10/16/2023   CHOL 164 02/25/2020   TRIG 131 02/25/2020   HDL 41 02/25/2020   LDLCALC  100 (H) 02/25/2020   ALT 12 07/18/2023   AST 20 07/18/2023   NA 142 11/01/2023   NA 142 11/01/2023   K 3.9 11/01/2023   K 3.9 11/01/2023   CL 106 10/16/2023   CREATININE 1.45 (H) 10/16/2023   BUN 23 10/16/2023   CO2 24 10/16/2023   TSH 4.500 03/29/2017   INR 1.01 02/19/2010   HGBA1C 5.4 12/25/2013      Assessment / Plan:     89 yo male with NYHA class 2 symptoms of severe paradoxical low flow low gradient AS with normal LV function and no CAD. With pts age and comorbidities he would best be served with TAVR and after a discussion of all the risks, goals and recovery he is willing to proceed with femoral access valve. We did discuss bailout options and currently we will assess the injury at time of surgery to determine if proceding with bailout feasable but then will need to stop his eliquis for 3 days prior to achieve bailout status   I have spent 60 min in review of the records, viewing studies and in face to face with patient and in coordination of future care    Eugenio Hoes 11/16/2023 9:00 AM

## 2023-11-16 NOTE — Progress Notes (Signed)
Pre Surgical Assessment: 5 M Walk Test  41M=16.46ft  5 Meter Walk Test- trial 1: 13.53 seconds 5 Meter Walk Test- trial 2: 13.93 seconds 5 Meter Walk Test- trial 3: 11.28 seconds 5 Meter Walk Test Average: 12.9 seconds

## 2023-11-16 NOTE — Patient Instructions (Signed)
TAVR

## 2023-11-17 ENCOUNTER — Other Ambulatory Visit: Payer: Self-pay

## 2023-11-17 DIAGNOSIS — I350 Nonrheumatic aortic (valve) stenosis: Secondary | ICD-10-CM

## 2023-11-22 ENCOUNTER — Encounter: Payer: Medicare Other | Admitting: Surgery

## 2023-11-24 ENCOUNTER — Other Ambulatory Visit: Payer: Self-pay

## 2023-11-24 ENCOUNTER — Encounter (HOSPITAL_COMMUNITY)
Admission: RE | Admit: 2023-11-24 | Discharge: 2023-11-24 | Disposition: A | Discharge status: Home / Self Care | Payer: Medicare Other | Source: Ambulatory Visit | Attending: Cardiovascular Disease | Admitting: Cardiovascular Disease

## 2023-11-24 ENCOUNTER — Ambulatory Visit (HOSPITAL_COMMUNITY)
Admission: RE | Admit: 2023-11-24 | Discharge: 2023-11-24 | Disposition: A | Discharge status: Home / Self Care | Payer: Medicare Other | Source: Ambulatory Visit | Attending: Cardiovascular Disease | Admitting: Cardiovascular Disease

## 2023-11-24 DIAGNOSIS — Z01812 Encounter for preprocedural laboratory examination: Secondary | ICD-10-CM | POA: Diagnosis present

## 2023-11-24 DIAGNOSIS — Z01818 Encounter for other preprocedural examination: Secondary | ICD-10-CM | POA: Diagnosis not present

## 2023-11-24 DIAGNOSIS — Z01810 Encounter for preprocedural cardiovascular examination: Secondary | ICD-10-CM | POA: Diagnosis present

## 2023-11-24 DIAGNOSIS — I350 Nonrheumatic aortic (valve) stenosis: Secondary | ICD-10-CM | POA: Insufficient documentation

## 2023-11-24 DIAGNOSIS — I771 Stricture of artery: Secondary | ICD-10-CM | POA: Diagnosis not present

## 2023-11-24 LAB — PROTIME-INR
INR: 1.2 (ref 0.8–1.2)
Prothrombin Time: 15.7 s — ABNORMAL HIGH (ref 11.4–15.2)

## 2023-11-24 LAB — URINALYSIS, ROUTINE W REFLEX MICROSCOPIC
Bilirubin Urine: NEGATIVE
Glucose, UA: NEGATIVE mg/dL
Ketones, ur: NEGATIVE mg/dL
Nitrite: NEGATIVE
Protein, ur: 100 mg/dL — AB
RBC / HPF: >50 RBC/hpf (ref 0–5)
Specific Gravity, Urine: 1.019 (ref 1.005–1.030)
WBC, UA: >50 WBC/hpf (ref 0–5)
pH: 5 (ref 5.0–8.0)

## 2023-11-24 LAB — CBC
HCT: 44.1 % (ref 39.0–52.0)
Hemoglobin: 15.1 g/dL (ref 13.0–17.0)
MCH: 32.1 pg (ref 26.0–34.0)
MCHC: 34.2 g/dL (ref 30.0–36.0)
MCV: 93.6 fL (ref 80.0–100.0)
Platelets: 164 10*3/uL (ref 150–400)
RBC: 4.71 MIL/uL (ref 4.22–5.81)
RDW: 14.8 % (ref 11.5–15.5)
WBC: 5.5 10*3/uL (ref 4.0–10.5)
nRBC: 0 % (ref 0.0–0.2)

## 2023-11-24 LAB — COMPREHENSIVE METABOLIC PANEL
ALT: 19 U/L (ref 0–44)
AST: 31 U/L (ref 15–41)
Albumin: 3.7 g/dL (ref 3.5–5.0)
Alkaline Phosphatase: 74 U/L (ref 38–126)
Anion gap: 7 (ref 5–15)
BUN: 21 mg/dL (ref 8–23)
CO2: 24 mmol/L (ref 22–32)
Calcium: 9.8 mg/dL (ref 8.9–10.3)
Chloride: 106 mmol/L (ref 98–111)
Creatinine, Ser: 1.87 mg/dL — ABNORMAL HIGH (ref 0.61–1.24)
GFR, Estimated: 35 mL/min — ABNORMAL LOW (ref >60)
Glucose, Bld: 94 mg/dL (ref 70–99)
Potassium: 4 mmol/L (ref 3.5–5.1)
Sodium: 137 mmol/L (ref 135–145)
Total Bilirubin: 0.9 mg/dL (ref 0.0–1.2)
Total Protein: 6.7 g/dL (ref 6.5–8.1)

## 2023-11-24 LAB — TYPE AND SCREEN
ABO/RH(D): O POS
Antibody Screen: NEGATIVE

## 2023-11-24 LAB — SURGICAL PCR SCREEN
MRSA, PCR: NEGATIVE
Staphylococcus aureus: NEGATIVE

## 2023-11-24 NOTE — Progress Notes (Addendum)
 Patient signed all consents at PAT lab appointment. Dial soap and instructions were given to patient. Dial soap surgical prep reviewed with patient and all questions answered.  Pt states he was recently diagnosed with UTI on February 18th and ordered a 10 day course of Bactrim BID which he started that night. Discussed with Cline Crock, RN with TAVR team and pt can continue taking antibiotic through the day before surgery, but NOT take it morning of surgery. Okay to get UA at lab appointment today.  Pt also states he fell on his hardwood floor last night and endorses left shoulder pain and left lateral rib pain. Cline Crock made aware of this as well.  Daughter Riccardo Dubin present throughout lab appointment and all questions answered.  Patients chart send to anesthesia for review. Pt denies any respiratory illness/infection in the last two months.

## 2023-11-27 MED ORDER — HEPARIN 30,000 UNITS/1000 ML (OHS) CELLSAVER SOLUTION
Status: DC
  Filled 2023-11-27 (×2): qty 1000

## 2023-11-27 MED ORDER — DEXMEDETOMIDINE HCL IN NACL 400 MCG/100ML IV SOLN
0.1000 ug/kg/h | INTRAVENOUS | Status: AC
Start: 2023-11-28 — End: 2023-11-29
  Administered 2023-11-28: 1 ug/kg/h via INTRAVENOUS
  Filled 2023-11-27: qty 100

## 2023-11-27 MED ORDER — NOREPINEPHRINE 4 MG/250ML-% IV SOLN
0.0000 ug/min | INTRAVENOUS | Status: AC
Start: 2023-11-28 — End: 2023-11-29
  Administered 2023-11-28: 2 ug/min via INTRAVENOUS
  Filled 2023-11-27: qty 250

## 2023-11-27 MED ORDER — POTASSIUM CHLORIDE 2 MEQ/ML IV SOLN
80.0000 meq | INTRAVENOUS | Status: DC
  Filled 2023-11-27 (×2): qty 40

## 2023-11-27 MED ORDER — VANCOMYCIN HCL IN DEXTROSE 1-5 GM/200ML-% IV SOLN
1000.0000 mg | INTRAVENOUS | Status: AC
  Administered 2023-11-28: 1000 mg via INTRAVENOUS
  Filled 2023-11-27: qty 200

## 2023-11-27 MED ORDER — MAGNESIUM SULFATE 50 % IJ SOLN
40.0000 meq | INTRAMUSCULAR | Status: DC
  Filled 2023-11-27 (×2): qty 9.85

## 2023-11-27 NOTE — H&P (Signed)
 .      [address]       [place] [address]             [phone]                                   [name] Record #[id number] Date of Birth: Aug 30, 1935   [name], MD [name], MD   Chief Complaint:   Increasing DOE   History of Present Illness:     Pt is a very pleasant 89 yo male who has been followed for moderate AS in the past. Pt has recently seen Dr Excell Seltzer and screening echo with now severe paradoxical low flow low gradient AS with a mean gradient of and a valve area of 0.8cm2 with a very highly calcified and restrictive valve on echo. He has a normal EF of 60%.  He underwent cath without CAD and recent CTA by my assessment has femoral access and size 29 sapien valve. He has multiple comorbidities with chronic atrial fibrillation, CKD, metastatic prostate cancer, multiple myeloma.              Past Medical History:  Diagnosis Date   BPH (benign prostatic hypertrophy)     Chronic diastolic heart failure (HCC) 12/27/2013   Chronic kidney disease      Hx of bilateral renal cysts.   Encounter for antineoplastic chemotherapy 02/20/2018   Glaucoma     Heart murmur     History of atrial fibrillation 03/09/09   Hx of epiglottitis      2011   Hypercholesterolemia     Hypertension     Long term current use of amiodarone 11/07/2013    Amiodarone    Mild aortic stenosis     OSA on CPAP 06/26/2014   Paroxysmal atrial fibrillation (HCC) 11/07/2013   Prostate cancer (HCC) 7/12    T1C Ad Ca  s/p TURP, observation only   Renal insufficiency 09/06/2011   Sinus node dysfunction (HCC) 08/14/2014   Sleep apnea     Smoldering multiple myeloma                 Past Surgical History:  Procedure Laterality Date   ADRENALECTOMY   02/12/11    Left, for Pheochromocytoma   CARDIOVERSION N/A 12/26/2013    Procedure: CARDIOVERSION;  Surgeon: Pricilla Riffle, MD;  Location: [facility] ENDOSCOPY;  Service: Cardiovascular;  Laterality:  N/A;   CHOLECYSTECTOMY   02/12/11   GASTRECTOMY   1984    S/P gastrectomy for ulcer   KNEE SURGERY   1971    S/P Right knee surgery   RIGHT/LEFT HEART CATH AND CORONARY ANGIOGRAPHY N/A 11/01/2023    Procedure: RIGHT/LEFT HEART CATH AND CORONARY ANGIOGRAPHY;  Surgeon: [name], MD;  Location: [facility] INVASIVE CV LAB;  Service: Cardiovascular;  Laterality: N/A;   TEE WITHOUT CARDIOVERSION N/A 12/26/2013    Procedure: TRANSESOPHAGEAL ECHOCARDIOGRAM (TEE);  Surgeon: Pricilla Riffle, MD;  Location: [facility] ENDOSCOPY;  Service: Cardiovascular;  Laterality: N/A;   TRANSURETHRAL RESECTION OF PROSTATE   7/12          Tobacco Use History  Social History       Tobacco Use  Smoking Status Never  Smokeless Tobacco Never      Social History       Substance and Sexual Activity  Alcohol Use No  Social History         Socioeconomic History   Marital status: Married      Spouse name: Not on file   Number of children: 3   Years of education: Not on file   Highest education level: Not on file  Occupational History      Employer: RETIRED  Tobacco Use   Smoking status: Never   Smokeless tobacco: Never  Vaping Use   Vaping status: Never Used  Substance and Sexual Activity   Alcohol use: No   Drug use: No   Sexual activity: Not Currently  Other Topics Concern   Not on file  Social History Narrative   Not on file    Social Drivers of Health    Financial Resource Strain: Not on file  Food Insecurity: Not on file  Transportation Needs: Not on file  Physical Activity: Not on file  Stress: Not on file  Social Connections: Not on file  Intimate Partner Violence: Not on file      Allergies       Allergies  Allergen Reactions   Benadryl [Diphenhydramine Hcl] Other (See Comments)      Bad dreams -  Hallucinations.   Ceftriaxone Sodium Hives   Contrast Media [Iodinated Contrast Media] Other (See Comments)      Poor kidney function   Nsaids Other (See Comments)      REACTION:  BLEEDING ULCER   Penicillins Hives   Farxiga [Dapagliflozin] Rash   Codeine        loopy   Doxycycline Nausea And Vomiting      Stomach pain   Ibuprofen Hives   Levaquin [Levofloxacin]        Unknown reaction    Oxybutynin Other (See Comments)      Lightheaded, dizzy, unbalanced. "Disturbed my equilibrium."   Tramadol Hcl        Other reaction(s): dizzy/nauseated   Chlorhexidine Rash      Use alcohol for skin prep per patient request.    Furosemide Diarrhea, Itching and Rash              Current Outpatient Medications  Medication Sig Dispense Refill   acetaminophen (TYLENOL) 500 MG tablet Take 1,000 mg by mouth 2 (two) times daily.       apixaban (ELIQUIS) 2.5 MG TABS tablet Take 1 tablet (2.5 mg total) by mouth 2 (two) times daily. 180 tablet 1   cholecalciferol (VITAMIN D3) 25 MCG (1000 UNIT) tablet Take 1,000 Units by mouth daily.       dorzolamide-timolol (COSOPT) 22.3-6.8 MG/ML ophthalmic solution Place 1 drop into both eyes 2 (two) times daily.       latanoprost (XALATAN) 0.005 % ophthalmic solution INSTILL 1 DROP INTO BOTH EYES EVERY DAY AT NIGHT   6   metoprolol tartrate (LOPRESSOR) 50 MG tablet Take one tablet by mouth at 8:30 AM on 11/14/2023 prior to CT scan 1 tablet 0      No current facility-administered medications for this visit.               Family History  Problem Relation Age of Onset   Prostate cancer Father     Prostate cancer Brother     Polymyositis Mother     Liver disease Sister     Liver disease Sister     Liver disease Sister     Melanoma Brother     Kidney Stones Brother  Physical Exam: Healthy appearing Lungs; clear Card: IRR with harsh systolic murmur Ext: no edema Neuro intact         Diagnostic Studies & Laboratory data: I have personally reviewed the following studies and agree with the findings   TTE (09/2023) IMPRESSIONS     1. Left ventricular ejection fraction, by estimation, is 60 to 65%. The   left ventricle has normal function. The left ventricle has no regional  wall motion abnormalities. There is moderate asymmetric left ventricular  hypertrophy of the basal-septal  segment. Left ventricular diastolic parameters are indeterminate.   2. Right ventricular systolic function is normal. The right ventricular  size is normal. There is normal pulmonary artery systolic pressure.   3. Left atrial size was mildly dilated.   4. The mitral valve is normal in structure. Mild mitral valve  regurgitation.   5. Aortic dilatation noted. There is dilatation of the aortic root,  measuring 41 mm. There is dilatation of the ascending aorta, measuring 41  mm.   6. The aortic valve is calcified. There is severe calcifcation of the  aortic valve. Aortic valve regurgitation is moderate. Severe aortic valve  stenosis. Moderate AS by gradients (Vmax 3.5 m/s, MG ), severe by  AVA (0.8cm^2) and DI (0.24). Low SV  index (29cc/m^2), suspect paradoxical low flow low gradient severe AS   FINDINGS   Left Ventricle: Left ventricular ejection fraction, by estimation, is 60  to 65%. The left ventricle has normal function. The left ventricle has no  regional wall motion abnormalities. The left ventricular internal cavity  size was normal in size. There is   moderate asymmetric left ventricular hypertrophy of the basal-septal  segment. Left ventricular diastolic parameters are indeterminate.   Right Ventricle: The right ventricular size is normal. No increase in  right ventricular wall thickness. Right ventricular systolic function is  normal. There is normal pulmonary artery systolic pressure. The tricuspid  regurgitant velocity is 2.17 m/s, and   with an assumed right atrial pressure of 8 mmHg, the estimated right  ventricular systolic pressure is 26.8 mmHg.   Left Atrium: Left atrial size was mildly dilated.   Right Atrium: Right atrial size was normal in size.   Pericardium: There is no  evidence of pericardial effusion.   Mitral Valve: The mitral valve is normal in structure. Mild mitral valve  regurgitation.   Tricuspid Valve: The tricuspid valve is normal in structure. Tricuspid  valve regurgitation is trivial.   Aortic Valve: The aortic valve is calcified. There is severe calcifcation  of the aortic valve. Aortic valve regurgitation is moderate. Aortic  regurgitation PHT measures 416 msec. Severe aortic stenosis is present.  Aortic valve mean gradient measures 23.7   mmHg. Aortic valve peak gradient measures 42.9 mmHg. Aortic valve area,  by VTI measures 0.84 cm.   Pulmonic Valve: The pulmonic valve was not well visualized. Pulmonic valve  regurgitation is not visualized.   Aorta: Aortic dilatation noted. There is dilatation of the aortic root,  measuring 41 mm. There is dilatation of the ascending aorta, measuring 41  mm.   IAS/Shunts: The interatrial septum was not well visualized.     LEFT VENTRICLE  PLAX 2D  LVIDd:         3.90 cm  LVIDs:         2.40 cm  LV PW:         1.00 cm  LV IVS:        1.20 cm  LVOT  diam:     2.10 cm  LV SV:         51  LV SV Index:   28  LVOT Area:     3.46 cm     RIGHT VENTRICLE  TAPSE (M-mode): 1.6 cm  RVSP:           21.8 mmHg   LEFT ATRIUM             Index        RIGHT ATRIUM           Index  LA diam:        4.30 cm 2.36 cm/m   RA Pressure: 3.00 mmHg  LA Vol (A2C):   82.5 ml 45.34 ml/m  RA Area:     12.90 cm  LA Vol (A4C):   50.2 ml 27.59 ml/m  RA Volume:   26.30 ml  14.46 ml/m  LA Biplane Vol: 69.1 ml 37.98 ml/m   AORTIC VALVE  AV Area (Vmax):    0.80 cm  AV Area (Vmean):   0.77 cm  AV Area (VTI):     0.84 cm  AV Vmax:           327.33 cm/s  AV Vmean:          224.000 cm/s  AV VTI:            0.606 m  AV Peak Grad:      42.9 mmHg  AV Mean Grad:      23.7 mmHg  LVOT Vmax:         75.30 cm/s  LVOT Vmean:        50.000 cm/s  LVOT VTI:          0.147 m  LVOT/AV VTI ratio: 0.24  AI PHT:             416 msec    AORTA  Ao Root diam: 4.10 cm  Ao Asc diam:  4.10 cm   TRICUSPID VALVE  TR Peak grad:   18.8 mmHg  TR Vmax:        217.00 cm/s  Estimated RAP:  3.00 mmHg  RVSP:           21.8 mmHg    SHUNTS  Systemic VTI:  0.15 m  Systemic Diam: 2.10 cm    CATH (10/2023) Conclusion       There is severe aortic valve stenosis.   1.  Widely patent coronary arteries with minimal irregularity present, no stenoses, right dominant coronary circulation 2.  Calcified and restricted aortic valve leaflets on plain fluoroscopy.  Mean transaortic gradient 23 mmHg, calculated aortic valve area by invasive gradient measurement is 0.89 cm. 3.  Normal right heart hemodynamics with preserved cardiac output, mean PA pressure 23 mmHg, wedge pressure 11 mmHg.    Recent Radiology Findings:    Imaging Results (Last 48 hours)  CT CORONARY MORPH W/CTA COR W/SCORE W/CA W/CM &/OR WO/CM Result Date: 11/14/2023 CLINICAL DATA:  Aortic Valve pathology with assessment for TAVR EXAM: Cardiac TAVR CT TECHNIQUE: The patient was scanned on a Siemens Force 192 slice scanner. A 120 kV retrospective scan was triggered in the descending thoracic aorta at 111 HU's. Gantry rotation speed was 270 msecs and collimation was .9 mm. No beta blockade or nitro were given. The 3D data set was reconstructed in 5% intervals of the R-R cycle. Systolic and diastolic phases were analyzed on a dedicated work station using MPR, MIP and VRT modes. The  patient received 95 cc of contrast. FINDINGS: Aortic Valve: Severely thickened aortic valve with heavy calcification and reduced excursion the planimeter valve area is 1.15 sq cm consistent with moderate to severe aortic stenosis. Morphology: Functionally bicuspid with right-left fusion/calcified raphe. Annular calcification: None. Aortic Valve Calcium Score: 3049. Presence of basal septal hypertrophy: Non severe. Perimembranous septal diameter: 7 mm. Mitral Valve: No calcifications. Aortic Annulus  Measurements- 20% phase Major annulus diameter: 32 mm. Minor annulus diameter: 29 mm. Annular perimeter: 94 mm. Annular area: 6.68 cm2. Aortic Measurements- 70% phase Sinotubular Junction: 37 mm. Ascending Thoracic Aorta: 43 mm. Aortic Arch: 37 mm. Descending Thoracic Aorta: 33 mm. Aortic atherosclerosis. Sinus of Valsalva Measurements: Right coronary cusp width: 40 mm Left coronary cusp width: 42 mm Non coronary cusp width: 40 mm Coronary Artery Height above Annulus: Left Main: 19 mm Left SoV height: 31 mm Right Coronary: 22 mm Right SoV height: 31 mm Optimum Fluoroscopic Angle for Delivery: LAO 9, CAU 7 Cusp overlay view angle: RAO 0, CAU 19 Valves for structural team consideration: Sapien 29 mm; upper limits for this valve type. Scene Saved for structural review. Evolut 34 mm, upper limits for this valve type. Non TAVR Valve Findings: Coronary Arteries: Normal coronary origin. Study not completed with nitroglycerin. Coronary Calcium Score: Left main: 0 Left anterior descending artery: 9 Left circumflex artery: 216 Right coronary artery: 4 Total: 229 Percentile: 86th for age, sex, and race matched control. Systemic veins: Normal anatomy. Main Pulmonary artery: Mild dilation 30 mm Pulmonary veins: Normal anatomy Left atrial appendage: There is lack of contrast opacification of the distal left atrial appendage. In comparison with calcium score acquisition, mixing artifact favored, cannot exclude thrombus. Interatrial septum: No communication. Chamber dimensions: Right ventricular enlargement. Bi-atrial dilation Pericardium: No calcification. Extra Cardiac Findings as per separate reporting. Notable artifacts: Motion artifact, poor contrast opacification of the left side Image quality: Poor IMPRESSION: 1. Moderate to severe aortic stenosis. Findings pertinent to TAVR procedure are detailed above. 2.  Left atrial appendage lack of opacification noted. 3.  Mild aortic dilation. RECOMMENDATIONS: The proposed cut-off  value of 1,651 AU yielded a 93 % sensitivity and 75 % specificity in grading AS severity in patients with classical low-flow, low-gradient AS. Proposed different cut-off values to define severe AS for men and women as 2,065 AU and 1,274 AU, respectively. The joint European and American recommendations for the assessment of AS consider the aortic valve calcium score as a continuum - a very high calcium score suggests severe AS and a low calcium score suggests severe AS is unlikely. Sunday Shams, et al. 2017 ESC/EACTS Guidelines for the management of valvular heart disease. Eur Heart J [phone] Coronary artery calcium (CAC) score is a strong predictor of incident coronary heart disease (CHD) and provides predictive information beyond traditional risk factors. CAC scoring is reasonable to use in the decision to withhold, postpone, or initiate statin therapy in intermediate-risk or selected borderline-risk asymptomatic adults (age 48-75 years and LDL-C >=70 to <190 mg/dL) who do not have diabetes or established atherosclerotic cardiovascular disease (ASCVD).* In intermediate-risk (10-year ASCVD risk >=7.5% to <20%) adults or selected borderline-risk (10-year ASCVD risk >=5% to <7.5%) adults in whom a CAC score is measured for the purpose of making a treatment decision the following recommendations have been made: If CAC = 0, it is reasonable to withhold statin therapy and reassess in 5 to 10 years, as long as higher risk conditions are absent (diabetes mellitus, family history of premature  CHD in first degree relatives (males <55 years; females <65 years), cigarette smoking, LDL >=190 mg/dL or other independent risk factors). If CAC is 1 to 99, it is reasonable to initiate statin therapy for patients >=63 years of age. If CAC is >=100 or >=75th percentile, it is reasonable to initiate statin therapy at any age. Cardiology referral should be considered for patients with CAC scores >=400 or >=75th  percentile. *2018 AHA/ACC/AACVPR/AAPA/ABC/ACPM/ADA/AGS/APhA/ASPC/NLA/PCNA Guideline on the Management of Blood Cholesterol: A Report of the American College of Cardiology/American Heart Association Task Force on Clinical Practice Guidelines. J Am Coll Cardiol. 2019;73(24):3168-3209. Mahesh  Chandrasekhar Electronically Signed   By: Riley Lam M.D.   On: 11/14/2023 14:02         Recent Lab Findings: Recent Labs       Lab Results  Component Value Date    WBC 6.2 10/16/2023    HGB 12.9 (L) 11/01/2023    HGB 12.9 (L) 11/01/2023    HCT 38.0 (L) 11/01/2023    HCT 38.0 (L) 11/01/2023    PLT 180 10/16/2023    GLUCOSE 100 (H) 10/16/2023    CHOL 164 02/25/2020    TRIG 131 02/25/2020    HDL 41 02/25/2020    LDLCALC 100 (H) 02/25/2020    ALT 12 07/18/2023    AST 20 07/18/2023    NA 142 11/01/2023    NA 142 11/01/2023    K 3.9 11/01/2023    K 3.9 11/01/2023    CL 106 10/16/2023    CREATININE 1.45 (H) 10/16/2023    BUN 23 10/16/2023    CO2 24 10/16/2023    TSH 4.500 03/29/2017    INR 1.01 02/19/2010    HGBA1C 5.4 12/25/2013            Assessment / Plan:     89 yo male with NYHA class 2 symptoms of severe paradoxical low flow low gradient AS with normal LV function and no CAD. With pts age and comorbidities he would best be served with TAVR and after a discussion of all the risks, goals and recovery he is willing to proceed with femoral access valve. We did discuss bailout options and currently we will assess the injury at time of surgery to determine if proceding with bailout feasable but then will need to stop his eliquis for 3 days prior to achieve bailout status

## 2023-11-28 ENCOUNTER — Other Ambulatory Visit: Payer: Self-pay | Admitting: Physician Assistant

## 2023-11-28 ENCOUNTER — Other Ambulatory Visit (HOSPITAL_COMMUNITY): Payer: Medicare Other

## 2023-11-28 ENCOUNTER — Inpatient Hospital Stay (HOSPITAL_COMMUNITY)
Admission: RE | Admit: 2023-11-28 | Discharge: 2023-11-29 | DRG: 267 | Disposition: A | Discharge status: Home / Self Care | Payer: Medicare Other | Attending: Cardiovascular Disease | Admitting: Cardiovascular Disease

## 2023-11-28 ENCOUNTER — Encounter (HOSPITAL_COMMUNITY)
Admission: RE | Disposition: A | Discharge status: Home / Self Care | Payer: Medicare Other | Source: Home / Self Care | Attending: Cardiovascular Disease

## 2023-11-28 ENCOUNTER — Inpatient Hospital Stay (HOSPITAL_COMMUNITY): Payer: Medicare Other

## 2023-11-28 ENCOUNTER — Inpatient Hospital Stay (HOSPITAL_COMMUNITY): Payer: Medicare Other | Admitting: Physician Assistant

## 2023-11-28 ENCOUNTER — Inpatient Hospital Stay (HOSPITAL_COMMUNITY): Payer: Medicare Other | Admitting: Certified Registered Nurse Anesthetist

## 2023-11-28 ENCOUNTER — Encounter (HOSPITAL_COMMUNITY): Payer: Self-pay | Admitting: Cardiovascular Disease

## 2023-11-28 DIAGNOSIS — Z952 Presence of prosthetic heart valve: Secondary | ICD-10-CM | POA: Diagnosis not present

## 2023-11-28 DIAGNOSIS — Z886 Allergy status to analgesic agent status: Secondary | ICD-10-CM | POA: Diagnosis not present

## 2023-11-28 DIAGNOSIS — G4733 Obstructive sleep apnea (adult) (pediatric): Secondary | ICD-10-CM

## 2023-11-28 DIAGNOSIS — N1832 Chronic kidney disease, stage 3b: Secondary | ICD-10-CM | POA: Diagnosis not present

## 2023-11-28 DIAGNOSIS — I350 Nonrheumatic aortic (valve) stenosis: Secondary | ICD-10-CM

## 2023-11-28 DIAGNOSIS — I352 Nonrheumatic aortic (valve) stenosis with insufficiency: Secondary | ICD-10-CM | POA: Diagnosis not present

## 2023-11-28 DIAGNOSIS — Z86018 Personal history of other benign neoplasm: Secondary | ICD-10-CM | POA: Diagnosis present

## 2023-11-28 DIAGNOSIS — I4821 Permanent atrial fibrillation: Secondary | ICD-10-CM | POA: Diagnosis present

## 2023-11-28 DIAGNOSIS — Z885 Allergy status to narcotic agent status: Secondary | ICD-10-CM

## 2023-11-28 DIAGNOSIS — I4891 Unspecified atrial fibrillation: Secondary | ICD-10-CM

## 2023-11-28 DIAGNOSIS — Z7901 Long term (current) use of anticoagulants: Secondary | ICD-10-CM

## 2023-11-28 DIAGNOSIS — E032 Hypothyroidism due to medicaments and other exogenous substances: Secondary | ICD-10-CM | POA: Diagnosis not present

## 2023-11-28 DIAGNOSIS — Z91041 Radiographic dye allergy status: Secondary | ICD-10-CM

## 2023-11-28 DIAGNOSIS — N390 Urinary tract infection, site not specified: Secondary | ICD-10-CM | POA: Diagnosis present

## 2023-11-28 DIAGNOSIS — N400 Enlarged prostate without lower urinary tract symptoms: Secondary | ICD-10-CM | POA: Diagnosis present

## 2023-11-28 DIAGNOSIS — Z006 Encounter for examination for normal comparison and control in clinical research program: Secondary | ICD-10-CM

## 2023-11-28 DIAGNOSIS — Z880 Allergy status to penicillin: Secondary | ICD-10-CM

## 2023-11-28 DIAGNOSIS — I480 Paroxysmal atrial fibrillation: Secondary | ICD-10-CM | POA: Diagnosis present

## 2023-11-28 DIAGNOSIS — Z9221 Personal history of antineoplastic chemotherapy: Secondary | ICD-10-CM | POA: Diagnosis not present

## 2023-11-28 DIAGNOSIS — E7800 Pure hypercholesterolemia, unspecified: Secondary | ICD-10-CM | POA: Diagnosis present

## 2023-11-28 DIAGNOSIS — I5032 Chronic diastolic (congestive) heart failure: Secondary | ICD-10-CM | POA: Diagnosis not present

## 2023-11-28 DIAGNOSIS — C61 Malignant neoplasm of prostate: Secondary | ICD-10-CM | POA: Diagnosis present

## 2023-11-28 DIAGNOSIS — Z888 Allergy status to other drugs, medicaments and biological substances status: Secondary | ICD-10-CM | POA: Diagnosis not present

## 2023-11-28 DIAGNOSIS — Z8042 Family history of malignant neoplasm of prostate: Secondary | ICD-10-CM

## 2023-11-28 DIAGNOSIS — I10 Essential (primary) hypertension: Secondary | ICD-10-CM | POA: Diagnosis present

## 2023-11-28 DIAGNOSIS — Z9049 Acquired absence of other specified parts of digestive tract: Secondary | ICD-10-CM

## 2023-11-28 DIAGNOSIS — I130 Hypertensive heart and chronic kidney disease with heart failure and stage 1 through stage 4 chronic kidney disease, or unspecified chronic kidney disease: Secondary | ICD-10-CM | POA: Diagnosis not present

## 2023-11-28 DIAGNOSIS — E871 Hypo-osmolality and hyponatremia: Secondary | ICD-10-CM | POA: Diagnosis present

## 2023-11-28 DIAGNOSIS — C9000 Multiple myeloma not having achieved remission: Secondary | ICD-10-CM | POA: Diagnosis not present

## 2023-11-28 DIAGNOSIS — Z9079 Acquired absence of other genital organ(s): Secondary | ICD-10-CM

## 2023-11-28 DIAGNOSIS — G459 Transient cerebral ischemic attack, unspecified: Secondary | ICD-10-CM | POA: Diagnosis present

## 2023-11-28 DIAGNOSIS — Z881 Allergy status to other antibiotic agents status: Secondary | ICD-10-CM | POA: Diagnosis not present

## 2023-11-28 DIAGNOSIS — Z808 Family history of malignant neoplasm of other organs or systems: Secondary | ICD-10-CM

## 2023-11-28 HISTORY — DX: Nonrheumatic aortic (valve) stenosis: I35.0

## 2023-11-28 HISTORY — DX: Presence of prosthetic heart valve: Z95.2

## 2023-11-28 HISTORY — PX: INTRAOPERATIVE TRANSTHORACIC ECHOCARDIOGRAM: SHX6523

## 2023-11-28 LAB — ECHOCARDIOGRAM LIMITED
AR max vel: 2.1 cm2
AV Area VTI: 4.1 cm2
AV Area mean vel: 2.22 cm2
AV Mean grad: 3.5 mmHg
AV Peak grad: 6 mmHg
Ao pk vel: 1.22 m/s
P 1/2 time: 463 ms
S' Lateral: 3.1 cm

## 2023-11-28 LAB — BASIC METABOLIC PANEL
Anion gap: 9 (ref 5–15)
BUN: 25 mg/dL — ABNORMAL HIGH (ref 8–23)
CO2: 18 mmol/L — ABNORMAL LOW (ref 22–32)
Calcium: 9.4 mg/dL (ref 8.9–10.3)
Chloride: 107 mmol/L (ref 98–111)
Creatinine, Ser: 1.8 mg/dL — ABNORMAL HIGH (ref 0.61–1.24)
GFR, Estimated: 36 mL/min — ABNORMAL LOW (ref >60)
Glucose, Bld: 110 mg/dL — ABNORMAL HIGH (ref 70–99)
Potassium: 4.8 mmol/L (ref 3.5–5.1)
Sodium: 134 mmol/L — ABNORMAL LOW (ref 135–145)

## 2023-11-28 LAB — POCT I-STAT, CHEM 8
BUN: 25 mg/dL — ABNORMAL HIGH (ref 8–23)
Calcium, Ion: 1.35 mmol/L (ref 1.15–1.40)
Chloride: 106 mmol/L (ref 98–111)
Creatinine, Ser: 2 mg/dL — ABNORMAL HIGH (ref 0.61–1.24)
Glucose, Bld: 154 mg/dL — ABNORMAL HIGH (ref 70–99)
HCT: 38 % — ABNORMAL LOW (ref 39.0–52.0)
Hemoglobin: 12.9 g/dL — ABNORMAL LOW (ref 13.0–17.0)
Potassium: 4.6 mmol/L (ref 3.5–5.1)
Sodium: 135 mmol/L (ref 135–145)
TCO2: 20 mmol/L — ABNORMAL LOW (ref 22–32)

## 2023-11-28 SURGERY — TRANSCATHETER AORTIC VALVE REPLACEMENT, TRANSFEMORAL (CATHLAB)
Anesthesia: Monitor Anesthesia Care

## 2023-11-28 MED ORDER — ACETAMINOPHEN 650 MG RE SUPP
650.0000 mg | Freq: Four times a day (QID) | RECTAL | Status: DC | PRN
Start: 2023-11-28 — End: 2023-11-29

## 2023-11-28 MED ORDER — SODIUM CHLORIDE 0.9 % IV SOLN: INTRAVENOUS | Status: DC

## 2023-11-28 MED ORDER — HEPARIN SODIUM (PORCINE) 1000 UNIT/ML IJ SOLN
INTRAMUSCULAR | Status: DC | PRN
  Administered 2023-11-28: 11000 [IU] via INTRAVENOUS

## 2023-11-28 MED ORDER — OXYCODONE HCL 5 MG PO TABS: 5.0000 mg | ORAL_TABLET | ORAL | Status: DC | PRN

## 2023-11-28 MED ORDER — SODIUM CHLORIDE 0.9 % IV SOLN: INTRAVENOUS | Status: AC

## 2023-11-28 MED ORDER — SODIUM CHLORIDE 0.9% FLUSH: 3.0000 mL | INTRAVENOUS | Status: DC | PRN

## 2023-11-28 MED ORDER — DORZOLAMIDE HCL-TIMOLOL MAL 2-0.5 % OP SOLN
1.0000 [drp] | Freq: Two times a day (BID) | OPHTHALMIC | Status: DC
Start: 2023-11-28 — End: 2023-11-29
  Administered 2023-11-29: 1 [drp] via OPHTHALMIC
  Filled 2023-11-28: qty 10

## 2023-11-28 MED ORDER — PROTAMINE SULFATE 10 MG/ML IV SOLN
INTRAVENOUS | Status: DC | PRN
  Administered 2023-11-28: 110 mg via INTRAVENOUS

## 2023-11-28 MED ORDER — LACTATED RINGERS IV SOLN: INTRAVENOUS | Status: DC | PRN

## 2023-11-28 MED ORDER — LATANOPROST 0.005 % OP SOLN
1.0000 [drp] | Freq: Every day | OPHTHALMIC | Status: DC
  Filled 2023-11-28: qty 2.5

## 2023-11-28 MED ORDER — SODIUM CHLORIDE 0.9 % IV SOLN: 250.0000 mL | INTRAVENOUS | Status: DC | PRN

## 2023-11-28 MED ORDER — SODIUM CHLORIDE 0.9% FLUSH
3.0000 mL | Freq: Two times a day (BID) | INTRAVENOUS | Status: DC
  Administered 2023-11-29: 3 mL via INTRAVENOUS

## 2023-11-28 MED ORDER — LIDOCAINE HCL (PF) 1 % IJ SOLN
INTRAMUSCULAR | Status: DC | PRN
  Administered 2023-11-28: 10 mL

## 2023-11-28 MED ORDER — ACETAMINOPHEN 325 MG PO TABS
650.0000 mg | ORAL_TABLET | Freq: Four times a day (QID) | ORAL | Status: DC | PRN
Start: 2023-11-28 — End: 2023-11-29

## 2023-11-28 MED ORDER — SODIUM CHLORIDE 0.9 % IV BOLUS
250.0000 mL | Freq: Once | INTRAVENOUS | Status: AC
  Administered 2023-11-28: 250 mL via INTRAVENOUS

## 2023-11-28 MED ORDER — IOHEXOL 350 MG/ML SOLN
INTRAVENOUS | Status: DC | PRN
  Administered 2023-11-28: 70 mL

## 2023-11-28 MED ORDER — NITROGLYCERIN IN D5W 200-5 MCG/ML-% IV SOLN: 0.0000 ug/min | INTRAVENOUS | Status: DC

## 2023-11-28 MED ORDER — VANCOMYCIN HCL IN DEXTROSE 1-5 GM/200ML-% IV SOLN: 1000.0000 mg | Freq: Once | INTRAVENOUS | Status: DC

## 2023-11-28 MED ORDER — ONDANSETRON HCL 4 MG/2ML IJ SOLN: 4.0000 mg | Freq: Four times a day (QID) | INTRAMUSCULAR | Status: DC | PRN

## 2023-11-28 MED ORDER — VASOPRESSIN 20 UNIT/ML IV SOLN
INTRAVENOUS | Status: AC
  Filled 2023-11-28: qty 1

## 2023-11-28 MED ORDER — LIDOCAINE HCL (PF) 1 % IJ SOLN
INTRAMUSCULAR | Status: AC
Start: 2023-11-28 — End: ?
  Filled 2023-11-28: qty 30

## 2023-11-28 MED ORDER — SULFAMETHOXAZOLE-TRIMETHOPRIM 800-160 MG PO TABS
1.0000 | ORAL_TABLET | Freq: Two times a day (BID) | ORAL | Status: DC
  Administered 2023-11-28 – 2023-11-29 (×2): 1 via ORAL
  Filled 2023-11-28 (×4): qty 1

## 2023-11-28 MED ORDER — CLEVIDIPINE BUTYRATE 0.5 MG/ML IV EMUL
INTRAVENOUS | Status: AC
  Filled 2023-11-28: qty 50

## 2023-11-28 SURGICAL SUPPLY — 30 items
BAG SNAP BAND KOVER 36X36 (MISCELLANEOUS) ×2 IMPLANT
CABLE ADAPT PACING TEMP 12FT (ADAPTER) IMPLANT
CATH COMMANDER DELIVERY SYS 29 (CATHETERS) IMPLANT
CATH DIAG 6FR PIGTAIL ANGLED (CATHETERS) IMPLANT
CATH INFINITI 5FR ANG PIGTAIL (CATHETERS) IMPLANT
CATH INFINITI 6F AL1 (CATHETERS) IMPLANT
CATH INFINITI 6F AL2 (CATHETERS) IMPLANT
CATH S G BIP PACING (CATHETERS) IMPLANT
CLOSURE MYNX CONTROL 6F/7F (Vascular Products) IMPLANT
CLOSURE PERCLOSE PROSTYLE (VASCULAR PRODUCTS) IMPLANT
CRIMPER (MISCELLANEOUS) IMPLANT
DEVICE INFLATION ATRION QL38 (MISCELLANEOUS) IMPLANT
KIT MICROPUNCTURE NIT STIFF (SHEATH) IMPLANT
KIT SAPIAN 3 ULTRA RESILIA 29 (Valve) IMPLANT
KIT SYRINGE INJ CVI SPIKEX1 (MISCELLANEOUS) IMPLANT
PACK CARDIAC CATHETERIZATION (CUSTOM PROCEDURE TRAY) ×1 IMPLANT
SET ATX-X65L (MISCELLANEOUS) IMPLANT
SHEATH BRITE TIP 6FR 35CM (SHEATH) IMPLANT
SHEATH BRITE TIP 7FR 35CM (SHEATH) IMPLANT
SHEATH INTRODUCER SET 29 (SHEATH) IMPLANT
SHEATH PINNACLE 6F 10CM (SHEATH) IMPLANT
SHEATH PINNACLE 8F 10CM (SHEATH) IMPLANT
SHEATH PROBE COVER 6X72 (BAG) IMPLANT
SHIELD CATH-GARD CONTAMINATION (MISCELLANEOUS) IMPLANT
STOPCOCK MORSE 400PSI 3WAY (MISCELLANEOUS) ×2 IMPLANT
WIRE AMPLATZ SS-J .035X180CM (WIRE) IMPLANT
WIRE EMERALD 3MM-J .035X150CM (WIRE) IMPLANT
WIRE EMERALD 3MM-J .035X260CM (WIRE) IMPLANT
WIRE EMERALD ST .035X260CM (WIRE) IMPLANT
WIRE SAFARI SM CURVE 275 (WIRE) IMPLANT

## 2023-11-28 NOTE — Anesthesia Procedure Notes (Signed)
 Arterial Line Insertion Start/End2/25/2025 7:20 AM, 11/28/2023 7:24 AM Performed by: Cy Blamer, CRNA, CRNA  Patient location: Pre-op. Preanesthetic checklist: patient identified, IV checked, site marked, risks and benefits discussed, surgical consent, monitors and equipment checked, pre-op evaluation, timeout performed and anesthesia consent Lidocaine 1% used for infiltration Right, radial was placed Catheter size: 20 G Hand hygiene performed  and maximum sterile barriers used  Allen's test indicative of satisfactory collateral circulation Attempts: 1 Procedure performed without using ultrasound guided technique. Following insertion, Biopatch and dressing applied. Post procedure assessment: normal and unchanged  Patient tolerated the procedure well with no immediate complications.

## 2023-11-28 NOTE — Anesthesia Preprocedure Evaluation (Addendum)
 Anesthesia Evaluation  Patient identified by MRN, date of birth, ID band Patient awake    Reviewed: Allergy & Precautions, NPO status , Patient's Chart, lab work & pertinent test results  Airway Mallampati: III  TM Distance: >3 FB Neck ROM: Full    Dental  (+) Teeth Intact, Dental Advisory Given   Pulmonary sleep apnea    breath sounds clear to auscultation       Cardiovascular hypertension, + dysrhythmias Atrial Fibrillation + Valvular Problems/Murmurs AS  Rhythm:Regular Rate:Normal + Systolic murmurs Echo:   1. Left ventricular ejection fraction, by estimation, is 60 to 65%. The  left ventricle has normal function. The left ventricle has no regional  wall motion abnormalities. There is moderate asymmetric left ventricular  hypertrophy of the basal-septal  segment. Left ventricular diastolic parameters are indeterminate.   2. Right ventricular systolic function is normal. The right ventricular  size is normal. There is normal pulmonary artery systolic pressure.   3. Left atrial size was mildly dilated.   4. The mitral valve is normal in structure. Mild mitral valve  regurgitation.   5. Aortic dilatation noted. There is dilatation of the aortic root,  measuring 41 mm. There is dilatation of the ascending aorta, measuring 41  mm.   6. The aortic valve is calcified. There is severe calcifcation of the  aortic valve. Aortic valve regurgitation is moderate. Severe aortic valve  stenosis. Moderate AS by gradients (Vmax 3.5 m/s, MG ), severe by  AVA (0.8cm^2) and DI (0.24). Low SV  index (29cc/m^2), suspect paradoxical low flow low gradient severe AS     Neuro/Psych  Neuromuscular disease  negative psych ROS   GI/Hepatic negative GI ROS, Neg liver ROS,,,  Endo/Other  Hypothyroidism    Renal/GU Renal disease     Musculoskeletal  (+) Arthritis ,    Abdominal   Peds  Hematology negative hematology ROS (+)    Anesthesia Other Findings   Reproductive/Obstetrics                             Anesthesia Physical Anesthesia Plan  ASA: 4  Anesthesia Plan: MAC   Post-op Pain Management: Minimal or no pain anticipated   Induction: Intravenous  PONV Risk Score and Plan: 1 and Ondansetron and Propofol infusion  Airway Management Planned: Natural Airway and Nasal Cannula  Additional Equipment: Arterial line  Intra-op Plan:   Post-operative Plan:   Informed Consent: I have reviewed the patients History and Physical, chart, labs and discussed the procedure including the risks, benefits and alternatives for the proposed anesthesia with the patient or authorized representative who has indicated his/her understanding and acceptance.       Plan Discussed with: CRNA  Anesthesia Plan Comments:        Anesthesia Quick Evaluation

## 2023-11-28 NOTE — Op Note (Signed)
 HEART AND VASCULAR CENTER   MULTIDISCIPLINARY HEART VALVE TEAM   TAVR OPERATIVE NOTE   Date of Procedure:  11/28/2023  Preoperative Diagnosis: Severe Aortic Stenosis   Postoperative Diagnosis: Same   Procedure:  Transcatheter Aortic Valve Replacement - Percutaneous Transfemoral Approach  Edwards Sapien 3 Ultra Resilia THV (size 29 mm, serial # [id number])   Co-Surgeons:  Eugenio Hoes, MD and [name], MD  Anesthesiologist:  Shona Simpson, MD  Echocardiographer:  Charlton Haws, MD  Pre-operative Echo Findings: Severe aortic stenosis and moderate AI Normal left ventricular systolic function  Post-operative Echo Findings: Mild paravalvular leak Normal/unchanged left ventricular systolic function  BRIEF CLINICAL NOTE AND INDICATIONS FOR SURGERY  89 yo male with history of permanent atrial fibrillation, chronic diastolic heart failure, stage IIIb chronic kidney disease, recurrent GU bleeding, metastatic prostate cancer, and indolent multiple myeloma. At the time of his last visit, we discussed the severity of his aortic stenosis was at least in the moderately severe range and he also had moderate aortic insufficiency. He favored a conservative approach because of mild symptoms, especially in the setting of his comorbid conditions. He presents today for follow-up after a surveillance echocardiogram. The patient now admits to modest progression of his shortness of breath with walking. He also has increased fatigue. He denies chest pain or pressure, lightheadedness, heart palpitations, or syncope. No orthopnea or PND. He has not had any recent bleeding problems. His recent echocardiogram shows an LVEF of 60 to 65%, normal RV function, severe calcification and restriction of the aortic valve with moderate aortic regurgitation and findings consistent with severe paradoxical low-flow low gradient aortic stenosis. The peak transaortic velocity is 3.5 m/s, mean gradient 26 mmHg, calculated  aortic valve area 0.8 cm, dimensionless index 0.24, and stroke-volume index of 29. He presents today for transfemoral TAVR with a 29 mm S3 Ultra Valve.   During the course of the patient's preoperative work up they have been evaluated comprehensively by a multidisciplinary team of specialists coordinated through the Multidisciplinary Heart Valve Clinic in the [facility].  They have been demonstrated to suffer from symptomatic severe aortic stenosis as noted above. The patient has been counseled extensively as to the relative risks and benefits of all options for the treatment of severe aortic stenosis including long term medical therapy, conventional surgery for aortic valve replacement, and transcatheter aortic valve replacement.  The patient has been independently evaluated in formal cardiac surgical consultation by Dr Leafy Ro, who deemed the patient appropriate for TAVR. Based upon review of all of the patient's preoperative diagnostic tests they are felt to be candidate for transcatheter aortic valve replacement using the transfemoral approach as an alternative to conventional surgery.    Following the decision to proceed with transcatheter aortic valve replacement, a discussion has been held regarding what types of management strategies would be attempted intraoperatively in the event of life-threatening complications, including whether or not the patient would be considered a candidate for the use of cardiopulmonary bypass and/or conversion to open sternotomy for attempted surgical intervention.  The patient has been advised of a variety of complications that might develop peculiar to this approach including but not limited to risks of death, stroke, paravalvular leak, aortic dissection or other major vascular complications, aortic annulus rupture, device embolization, cardiac rupture or perforation, acute myocardial infarction, arrhythmia, heart block or bradycardia requiring  permanent pacemaker placement, congestive heart failure, respiratory failure, renal failure, pneumonia, infection, other late complications related to structural valve deterioration or migration,  or other complications that might ultimately cause a temporary or permanent loss of functional independence or other long term morbidity.  The patient provides full informed consent for the procedure as described and all questions were answered preoperatively.  DETAILS OF THE OPERATIVE PROCEDURE  PREPARATION:   The patient is brought to the operating room on the above mentioned date and central monitoring was established by the anesthesia team including placement of a radial arterial line. The patient is placed in the supine position on the operating table.  Intravenous antibiotics are administered. The patient is monitored closely throughout the procedure under conscious sedation.  Baseline transthoracic echocardiogram is performed. The patient's chest, abdomen, both groins, and both lower extremities are prepared and draped in a sterile manner. A time out procedure is performed.   PERIPHERAL ACCESS:   Using ultrasound guidance, femoral arterial and venous access is obtained with placement of 6 Fr sheaths on the right side.  [place] images are digitally captured and stored in the patient's chart. A pigtail diagnostic catheter was passed through the femoral arterial sheath under fluoroscopic guidance into the aortic root.  A temporary transvenous pacemaker catheter was passed through the femoral venous sheath under fluoroscopic guidance into the right ventricle.  The pacemaker was tested to ensure stable lead placement and pacemaker capture. Aortic root angiography was performed in order to determine the optimal angiographic angle for valve deployment.  TRANSFEMORAL ACCESS:  A micropuncture technique is used to access the left femoral artery under fluoroscopic and ultrasound guidance.  2 Perclose devices are  deployed at 10' and 2' positions to 'PreClose' the femoral artery. An 8 French sheath is placed and then an Amplatz Superstiff wire is advanced through the sheath. This is changed out for a 16 French transfemoral E-Sheath after progressively dilating over the Superstiff wire.  An AL-2 catheter was used to direct a straight-tip exchange length wire across the native aortic valve into the left ventricle. This was exchanged out for a pigtail catheter and position was confirmed in the LV apex. Simultaneous LV and Ao pressures were recorded.  The pigtail catheter was exchanged for a Safari wire in the LV apex.    BALLOON AORTIC VALVULOPLASTY:  Not performed  TRANSCATHETER HEART VALVE DEPLOYMENT:  An Edwards Sapien 3 Ultra Resilia transcatheter heart valve (size 29 mm) was prepared and crimped per manufacturer's guidelines, and the proper orientation of the valve is confirmed on the [employer] delivery system. The valve was advanced through the introducer sheath using normal technique until in an appropriate position in the abdominal aorta beyond the sheath tip. The balloon was then retracted and using the fine-tuning wheel was centered on the valve. The valve was then advanced across the aortic arch using appropriate flexion of the catheter. The valve was carefully positioned across the aortic valve annulus. The Commander catheter was retracted using normal technique. Once final position of the valve has been confirmed by angiographic assessment, the valve is deployed while temporarily holding ventilation and during rapid ventricular pacing to maintain systolic blood pressure < 50 mmHg and pulse pressure < 10 mmHg. The balloon inflation is held for >3 seconds after reaching full deployment volume. Once the balloon has fully deflated the balloon is retracted into the ascending aorta and valve function is assessed using echocardiography. The patient's hemodynamic recovery following valve deployment is good.   The deployment balloon and guidewire are both removed. Echo demostrated acceptable post-procedural gradients, stable mitral valve function, and mild paravalvular aortic insufficiency.  PROCEDURE COMPLETION:  The sheath was removed and femoral artery closure is performed using the 2 previously deployed Perclose devices.  Protamine is administered once femoral arterial repair was complete. The site is clear with no evidence of bleeding or hematoma after the sutures are tightened. The temporary pacemaker and pigtail catheters are removed. Mynx closure is used for contralateral femoral arterial hemostasis for the 6 Fr sheath.  The patient tolerated the procedure well and is transported to the recovery area in stable condition. There were no immediate intraoperative complications. All sponge instrument and needle counts are verified correct at completion of the operation.   The patient received a total of 70 mL of intravenous contrast during the procedure.  EBL: minimal  LVEDP: <12 mmhg   [name], MD 11/28/2023 9:45 AM

## 2023-11-28 NOTE — Progress Notes (Addendum)
  HEART AND VASCULAR CENTER   MULTIDISCIPLINARY HEART VALVE TEAM  Patient doing well s/p TAVR. He is hemodynamically stable. Groin sites stable. ECG with afib and  no high grade block. Transferred from cath lab holding to 4E. I stat labs show creat 2.0 (LVEDP <12 at the time of TAVR). Will gently hydrate him with IV fluids. Also currently being treated for a UTI and not emptying bladder well. Recent CT showed bladder distension. Will do a bladder scan. Early ambulation after bedrest completed and hopeful discharge over the next 24-48 hours.   Cline Crock PA-C  [facility]  Pager [phone]

## 2023-11-28 NOTE — Discharge Summary (Incomplete)
 HEART AND VASCULAR CENTER   MULTIDISCIPLINARY HEART VALVE TEAM  Discharge Summary    Patient ID: Thomas [name] MRN: [id number]; DOB: 1935-09-21  Admit date: 11/28/2023 Discharge date: 11/29/2023  Primary Care Provider: [name], MD  Primary Cardiologist: [name], MD   Discharge Diagnoses    Principal Problem:   S/P TAVR (transcatheter aortic valve replacement) Active Problems:   Hypertension   Hypercholesterolemia   Benign prostatic hyperplasia   Paroxysmal atrial fibrillation (HCC)   Chronic diastolic heart failure (HCC)   OSA on CPAP   Malignant neoplasm of prostate (HCC)   Drug-induced hypothyroidism   History of pheochromocytoma   Transient ischemic attack   Severe aortic stenosis   Allergies Allergies  Allergen Reactions   Benadryl [Diphenhydramine Hcl] Other (See Comments)    Bad dreams -  Hallucinations.   Ceftriaxone Sodium Hives   Nsaids Other (See Comments)    REACTION: BLEEDING ULCER   Penicillins Hives   Farxiga [Dapagliflozin] Rash   Codeine     loopy   Doxycycline Nausea And Vomiting    Stomach pain   Ibuprofen Hives    Was told to stay off it by Dr. Katrinka Blazing.   Levaquin [Levofloxacin]     Unknown reaction    Oxybutynin Other (See Comments)    Lightheaded, dizzy, unbalanced. "Disturbed my equilibrium."   Tramadol Hcl     Other reaction(s): dizzy/nauseated   Chlorhexidine Rash    Use alcohol for skin prep per patient request.    Furosemide Diarrhea, Itching and Rash    Diagnostic Studies/Procedures    TAVR OPERATIVE NOTE     Date of Procedure:                11/28/2023   Preoperative Diagnosis:      Severe Aortic Stenosis    Postoperative Diagnosis:    Same    Procedure:        Transcatheter Aortic Valve Replacement - Percutaneous left Transfemoral Approach             Edwards Sapien 3 Ultra THV (size 29 mm, model # 9755RSL, serial # [id number])              Co-Surgeons:                        Eugenio Hoes MD and [name], MD     Anesthesiologist:                  Shona Simpson MD   Echocardiographer:              Charlton Haws MD   Pre-operative Echo Findings: Severe aortic stenosis normal left ventricular systolic function   Post-operative Echo Findings: trivial paravalvular leak normal left ventricular systolic function  _____________    Echo 11/29/23: completed but pending formal read at the time of discharge   History of Present Illness     Thomas [name] is a 89 y.o. male with a history of permanent atrial fibrillation on Eliquis, LEFT pheochromocytoma s/p resection (2010), chronic diastolic heart failure, stage IIIb chronic kidney disease, recurrent GU bleeding, metastatic prostate cancer, indolent multiple myeloma and severe LFLG AS with mod AI who presented to [facility] on 11/28/23 for planned TAVR.   He has been followed by Dr Excell Seltzer since Dr. Michaelle Copas retirement. He has known aortic stenosis. Echo 09/08/23 showed EF 60-65%, normal RV function, severe calcification and restriction of the aortic valve with moderate aortic regurgitation  and findings consistent with severe paradoxical low-flow low gradient aortic stenosis. The peak transaortic velocity was 3.5 m/s, mean gradient 26 mmHg, AVA 0.8 cm, DVI 0.24, and SVI 29. L/RHC 11/01/23 showed widely patent coronary arteries with minimal irregularity present, no stenoses, right dominant coronary circulation and normal right heart hemodynamics with persevered cardiac output.   The patient was evaluated by the multidisciplinary valve team and felt to have severe, symptomatic aortic stenosis and to be a suitable candidate for TAVR, which was set up for 11/28/23.   Hospital Course     Consultants: none   Severe LFLG AS with mod AI:  -- S/p successful TAVR with a 29 mm Edwards Sapien 3 Ultra Resilia THV via the TF approach on 11/28/23.  -- Post operative echo completed but pending formal read.  -- Groin sites are stable.  -- ECG with afib and no high  grade heart block.  -- Resume home Eliquis 2.5mg  BID. -- Met with cardiac rehab to discuss CRP phase II.  -- Plan for discharge home today with close follow up in the outpatient setting.   Permanent atrial fibrillation:  -- Rate elevated around 110 at rest and 140s with ambulation.  -- Not on any AVN blocking agents. Will add Toprol XL 25mg  daily.  -- Resumed on home Eliquis 2.5mg  BID.  (Age >80, Creat >1.5)  Chronic diastolic CHF: -- LVEDP <12. -- Not on any diuretic therapy. -- Gently hydrated as above.  CKD stage IIIb: -- Previous creat baseline appears to be 1.36-1.5. 1.87 on PAT labs. -- Creat up to 1.97 today.  -- He was gently hydrated yesterday. Bladder scan normal. Not on any nephrotoxic agents.  -- Will check BMET in office next week in the office. -- If it remains elevated, will get him back in with nephrology (remotely saw Dr. Lynda Rainwater).  Hyponatremia:  -- NA 132.  -- BMET next week.   UTI: -- Started on 10 day course of Bactrim by PCP. Continued during admission.  -- With worsening renal function (creat clearance <30) he will need a dose reduction. -- Will resume at discharge to finish course but take half tablets ( discussed with Malva Cogan Pharm-D).  TAA:  -- Pre TAVR CT showed dilatation of the ascending aorta to 4.1 cm.  -- This will be followed over time.    Metastatic prostate cancer:  -- Followed by Dr. Sofie Hartigan.  Indolent multiple myeloma:  -- Followed by Dr. Sofie Hartigan.  _____________  Discharge Vitals Blood pressure 118/79, pulse 98, temperature 98.1 F (36.7 C), temperature source Oral, resp. rate 20, height 5' 4.5" (1.638 m), weight 72.3 kg, SpO2 95%.  Filed Weights   11/28/23 0607 11/29/23 0450  Weight: 74.8 kg 72.3 kg    GEN: Well nourished, well developed, in no acute distress HEENT: normal Neck: no JVD or masses Cardiac: soft flow murmur, tachy. No murmurs, rubs, or gallops,no edema  Respiratory:  clear to auscultation bilaterally,  normal work of breathing GI: soft, nontender, nondistended, + BS MS: no deformity or atrophy Skin: warm and dry, no rash.  Groin sites clear without hematoma or ecchymosis  Neuro:  Alert and Oriented x 3, Strength and sensation are intact Psych: euthymic mood, full affect   Disposition   Pt is being discharged home today in good condition.  Follow-up Plans & Appointments     Follow-up Information     Janetta Hora, PA-C. Go on 12/06/2023.   Specialties: Cardiology, Radiology Why: @ 2:20, please arrive at least  15 minutes early Contact information: [address] [place] [address] [phone]                Discharge Instructions     Amb Referral to Cardiac Rehabilitation   Complete by: As directed    Diagnosis: Valve Replacement   Valve: Aortic Comment - tavr   After initial evaluation and assessments completed: Virtual Based Care may be provided alone or in conjunction with Phase 2 Cardiac Rehab based on patient barriers.: Yes   Intensive Cardiac Rehabilitation (ICR) [facility] location only OR Traditional Cardiac Rehabilitation (TCR) *If criteria for ICR are not met will enroll in TCR [facility] only): Yes       Discharge Medications   Allergies as of 11/29/2023       Reactions   Benadryl [diphenhydramine Hcl] Other (See Comments)   Bad dreams -  Hallucinations.   Ceftriaxone Sodium Hives   Nsaids Other (See Comments)   REACTION: BLEEDING ULCER   Penicillins Hives   Farxiga [dapagliflozin] Rash   Codeine    loopy   Doxycycline Nausea And Vomiting   Stomach pain   Ibuprofen Hives   Was told to stay off it by Dr. Katrinka Blazing.   Levaquin [levofloxacin]    Unknown reaction    Oxybutynin Other (See Comments)   Lightheaded, dizzy, unbalanced. "Disturbed my equilibrium."   Tramadol Hcl    Other reaction(s): dizzy/nauseated   Chlorhexidine Rash   Use alcohol for skin prep per patient request.    Furosemide Diarrhea, Itching, Rash         Medication List     TAKE these medications    acetaminophen 500 MG tablet Commonly known as: TYLENOL Take 1,000 mg by mouth 2 (two) times daily as needed for mild pain (pain score 1-3) or moderate pain (pain score 4-6).   apixaban 2.5 MG Tabs tablet Commonly known as: Eliquis Take 1 tablet (2.5 mg total) by mouth 2 (two) times daily.   cholecalciferol 25 MCG (1000 UNIT) tablet Commonly known as: VITAMIN D3 Take 1,000 Units by mouth daily.   dorzolamide-timolol 2-0.5 % ophthalmic solution Commonly known as: COSOPT Place 1 drop into both eyes 2 (two) times daily.   latanoprost 0.005 % ophthalmic solution Commonly known as: XALATAN INSTILL 1 DROP INTO BOTH EYES EVERY DAY AT NIGHT   metoprolol succinate 25 MG 24 hr tablet Commonly known as: Toprol XL Take 1 tablet (25 mg total) by mouth daily.   sulfamethoxazole-trimethoprim 800-160 MG tablet Commonly known as: BACTRIM DS Take 0.5 tablets by mouth 2 (two) times daily for 3 doses. What changed: how much to take          Outstanding Labs/Studies   BMET ____________________  Duration of Discharge Encounter: APP Time: 20 minutes    Signed, Cline Crock, PA-C 11/29/2023, 12:05 PM [phone]  Patient seen, examined. Available data reviewed. Agree with findings, assessment, and plan as outlined by Carlean Jews, PA-C.  The patient is independently interviewed and examined.  His daughter is at the bedside.  HEENT is normal, lungs are clear bilaterally, heart is irregularly irregular with no murmur gallop, abdomen soft nontender, bilateral groin sites are clear with minimal ecchymosis but no firm hematoma or tenderness.  Skin is warm and dry with no rash.  There is no lower extremity edema.  The patient's postoperative day #1 echo is reviewed and demonstrates LVEF of 60 to 65%, normal RV function, normal mean gradient of 5 mmHg with mild paravalvular regurgitation, otherwise no  significant abnormalities.  The patient is  clinically stable and medically appears ready for hospital discharge.  We will start metoprolol succinate for better heart rate control of his atrial fibrillation.  He will continue on apixaban which is dose reduced based on his age and kidney dysfunction.  Will repeat labs next week.  He is reminded about the importance of fluid hydration.  Otherwise, as outlined above.  [name], M.D. 11/29/2023 1:09 PM

## 2023-11-28 NOTE — Progress Notes (Signed)
 Pt arrived from cath lab. CCMD called and verified [id number]. CHG bath and skin assessed, no issues. Bilateral groin dressings clean, dry and intact. Family at bedside and aware of bedrest and HOB restrictions. Patient guide given and daughter to order lnch. Pt resting with call bell within reach.  Will continue to monitor.

## 2023-11-28 NOTE — Discharge Instructions (Signed)

## 2023-11-28 NOTE — Progress Notes (Signed)
 Due to crcl~25, preop vancomycin 1g IV x1 will provide coverage beyond 24 hrs.  Ulyses Southward, PharmD, BCIDP, AAHIVP, CPP Infectious Disease Pharmacist 11/28/2023 11:54 AM

## 2023-11-28 NOTE — Interval H&P Note (Signed)
 History and Physical Interval Note:  11/28/2023 6:23 AM  Thomas [name]  has presented today for surgery, with the diagnosis of Severe Aortic Stenosis.  The various methods of treatment have been discussed with the patient and family. After consideration of risks, benefits and other options for treatment, the patient has consented to  Procedure(s): Transcatheter Aortic Valve Replacement, Transfemoral (N/A) INTRAOPERATIVE TRANSTHORACIC ECHOCARDIOGRAM (N/A) as a surgical intervention.  The patient's history has been reviewed, patient examined, no change in status, stable for surgery.  I have reviewed the patient's chart and labs.  Questions were answered to the patient's satisfaction.     Eugenio Hoes

## 2023-11-28 NOTE — Anesthesia Postprocedure Evaluation (Signed)
 Anesthesia Post Note  Patient: Jihad Brownlow Langdon  Procedure(s) Performed: Transcatheter Aortic Valve Replacement, Transfemoral INTRAOPERATIVE TRANSTHORACIC ECHOCARDIOGRAM     Patient location during evaluation: PACU Anesthesia Type: MAC Level of consciousness: awake and alert Pain management: pain level controlled Vital Signs Assessment: post-procedure vital signs reviewed and stable Respiratory status: spontaneous breathing, nonlabored ventilation, respiratory function stable and patient connected to nasal cannula oxygen Cardiovascular status: stable and blood pressure returned to baseline Postop Assessment: no apparent nausea or vomiting Anesthetic complications: no  There were no known notable events for this encounter.  Last Vitals:  Vitals:   11/28/23 1200 11/28/23 1230  BP: 99/78 103/77  Pulse:    Resp:    Temp:    SpO2:      Last Pain:  Vitals:   11/28/23 1131  TempSrc: Oral  PainSc: 0-No pain                 Shelton Silvas

## 2023-11-28 NOTE — Op Note (Signed)
 [facility]   MULTIDISCIPLINARY HEART VALVE TEAM   TAVR OPERATIVE NOTE   Date of Procedure:  11/28/2023  Preoperative Diagnosis: Severe Aortic Stenosis   Postoperative Diagnosis: Same   Procedure:   Transcatheter Aortic Valve Replacement - Percutaneous left Transfemoral Approach  Edwards Sapien 3 Ultra THV (size 29 mm, model # 9755RSL, serial # [id number])   Co-Surgeons:  Eugenio Hoes MD and [name], MD  /  Verne Carrow / Alverda Skeans, MD   Anesthesiologist:  Shona Simpson MD  Echocardiographer:  Charlton Haws MD  Pre-operative Echo Findings: Severe aortic stenosis normal left ventricular systolic function  Post-operative Echo Findings: trivial paravalvular leak normal left ventricular systolic function   BRIEF CLINICAL NOTE AND INDICATIONS FOR SURGERY  89 yo male with NYHA class 2 symptoms of severe paradoxical low flow low gradient AS with normal LV function and no CAD. With pts age and comorbidities he would best be served with TAVR and after a discussion of all the risks, goals and recovery he is willing to proceed with femoral access valve     DETAILS OF THE OPERATIVE PROCEDURE  PREPARATION:    The patient was brought to the operating room on the above mentioned date and appropriate monitoring was established by the anesthesia team. The patient was placed in the supine position on the operating table.  Intravenous antibiotics were administered. The patient was monitored closely throughout the procedure under conscious sedation.  Baseline transthoracic echocardiogram was performed. The patient's abdomen and both groins were prepped and draped in a sterile manner. A time out procedure was performed.   PERIPHERAL ACCESS:    Using the modified Seldinger technique, femoral arterial and venous access was obtained with placement of 6 Fr sheaths on the right side.  A pigtail diagnostic catheter was passed through the right arterial  sheath under fluoroscopic guidance into the aortic root.  A temporary transvenous pacemaker catheter was passed through the right femoral venous sheath under fluoroscopic guidance into the right ventricle.  The pacemaker was tested to ensure stable lead placement and pacemaker capture. Aortic root angiography was performed in order to determine the optimal angiographic angle for valve deployment.   TRANSFEMORAL ACCESS:   Percutaneous transfemoral access and sheath placement was performed using ultrasound guidance.  The left common femoral artery was cannulated using a micropuncture needle and appropriate location was verified using hand injection angiogram.  A pair of Abbott Perclose percutaneous closure devices were placed and a 6 French sheath replaced into the femoral artery.  The patient was heparinized systemically and ACT verified > 250 seconds.    A 16 Fr transfemoral E-sheath was introduced into the left common femoral artery after progressively dilating over an Amplatz superstiff wire. An AL1 catheter was used to direct a straight-tip exchange length wire across the native aortic valve into the left ventricle. This was exchanged out for a pigtail catheter and position was confirmed in the LV apex. Simultaneous LV and Ao pressures were recorded.LVEDP less than  The pigtail catheter was exchanged for a Safari wire in the LV apex.   BALLOON AORTIC VALVULOPLASTY:   Was not done   TRANSCATHETER HEART VALVE DEPLOYMENT:   An Edwards Sapien 3 Ultra transcatheter heart valve (size 29 mm) was prepared and crimped per manufacturer's guidelines, and the proper orientation of the valve is confirmed on the [employer] delivery system. The valve was advanced through the introducer sheath using normal technique until in an appropriate position in the abdominal  aorta beyond the sheath tip. The balloon was then retracted and using the fine-tuning wheel was centered on the valve. The valve was  then advanced across the aortic arch using appropriate flexion of the catheter. The valve was carefully positioned across the aortic valve annulus. The Commander catheter was retracted using normal technique. Once final position of the valve has been confirmed by angiographic assessment, the valve is deployed during rapid ventricular pacing to maintain systolic blood pressure < 50 mmHg and pulse pressure < 10 mmHg. The balloon inflation is held for >3 seconds after reaching full deployment volume. Once the balloon has fully deflated the balloon is retracted into the ascending aorta and valve function is assessed using echocardiography. There is felt to be trivial paravalvular leak and no central aortic insufficiency.This wa confirmed by aortography  The patient's hemodynamic recovery following valve deployment is good.  The deployment balloon and guidewire are both removed.    PROCEDURE COMPLETION:   The sheath was removed and femoral artery closure performed.  Protamine was administered once femoral arterial repair was complete. The temporary pacemaker, pigtail catheter and femoral sheaths were removed with manual pressure used for venous hemostasis.  A Mynx femoral closure device was utilized following removal of the diagnostic sheath in the right femoral artery.  The patient tolerated the procedure well and is transported to the cath lab recovery area in stable condition. There were no immediate intraoperative complications. All sponge instrument and needle counts are verified correct at completion of the operation.   No blood products were administered during the operation.    Eugenio Hoes, MD 11/28/2023 9:24 AM

## 2023-11-28 NOTE — Progress Notes (Signed)
 Received report from RN Debbie in cath lab. Levophed stopped and right arterial line to be removed. Pt stable per report. Thomas Hoff, RN

## 2023-11-28 NOTE — Transfer of Care (Signed)
 Immediate Anesthesia Transfer of Care Note  Patient: Thomas [name]  Procedure(s) Performed: Transcatheter Aortic Valve Replacement, Transfemoral INTRAOPERATIVE TRANSTHORACIC ECHOCARDIOGRAM  Patient Location: Cath Lab  Anesthesia Type:MAC  Level of Consciousness: awake, alert , and oriented  Airway & Oxygen Therapy: Patient Spontanous Breathing and Patient connected to nasal cannula oxygen  Post-op Assessment: Report given to RN, Post -op Vital signs reviewed and stable, Patient moving all extremities X 4, and Patient able to stick tongue midline  Post vital signs: Reviewed and stable  Last Vitals:  Vitals Value Taken Time  BP 110/71   Temp 98.6   Pulse 78 11/28/23 0938  Resp 15 11/28/23 0938  SpO2 95 % 11/28/23 0938  Vitals shown include unfiled device data.  Last Pain:  Vitals:   11/28/23 0920  TempSrc:   PainSc: Asleep      Patients Stated Pain Goal: 2 (11/28/23 0746)  Complications: There were no known notable events for this encounter.

## 2023-11-29 ENCOUNTER — Inpatient Hospital Stay (HOSPITAL_COMMUNITY): Payer: Medicare Other

## 2023-11-29 DIAGNOSIS — I130 Hypertensive heart and chronic kidney disease with heart failure and stage 1 through stage 4 chronic kidney disease, or unspecified chronic kidney disease: Secondary | ICD-10-CM | POA: Diagnosis not present

## 2023-11-29 DIAGNOSIS — I352 Nonrheumatic aortic (valve) stenosis with insufficiency: Secondary | ICD-10-CM | POA: Diagnosis not present

## 2023-11-29 DIAGNOSIS — Z006 Encounter for examination for normal comparison and control in clinical research program: Secondary | ICD-10-CM | POA: Diagnosis not present

## 2023-11-29 DIAGNOSIS — Z952 Presence of prosthetic heart valve: Secondary | ICD-10-CM

## 2023-11-29 DIAGNOSIS — C9000 Multiple myeloma not having achieved remission: Secondary | ICD-10-CM | POA: Diagnosis not present

## 2023-11-29 LAB — BASIC METABOLIC PANEL
Anion gap: 5 (ref 5–15)
BUN: 30 mg/dL — ABNORMAL HIGH (ref 8–23)
CO2: 17 mmol/L — ABNORMAL LOW (ref 22–32)
Calcium: 8.9 mg/dL (ref 8.9–10.3)
Chloride: 110 mmol/L (ref 98–111)
Creatinine, Ser: 1.97 mg/dL — ABNORMAL HIGH (ref 0.61–1.24)
GFR, Estimated: 32 mL/min — ABNORMAL LOW (ref >60)
Glucose, Bld: 99 mg/dL (ref 70–99)
Potassium: 4.3 mmol/L (ref 3.5–5.1)
Sodium: 132 mmol/L — ABNORMAL LOW (ref 135–145)

## 2023-11-29 LAB — CBC
HCT: 37.6 % — ABNORMAL LOW (ref 39.0–52.0)
Hemoglobin: 13.1 g/dL (ref 13.0–17.0)
MCH: 31.9 pg (ref 26.0–34.0)
MCHC: 34.8 g/dL (ref 30.0–36.0)
MCV: 91.5 fL (ref 80.0–100.0)
Platelets: 115 10*3/uL — ABNORMAL LOW (ref 150–400)
RBC: 4.11 MIL/uL — ABNORMAL LOW (ref 4.22–5.81)
RDW: 14.5 % (ref 11.5–15.5)
WBC: 10.2 10*3/uL (ref 4.0–10.5)
nRBC: 0 % (ref 0.0–0.2)

## 2023-11-29 LAB — ECHOCARDIOGRAM COMPLETE
AR max vel: 2.27 cm2
AV Area VTI: 2.47 cm2
AV Area mean vel: 2.28 cm2
AV Mean grad: 4 mmHg
AV Peak grad: 7.7 mmHg
Ao pk vel: 1.39 m/s
Height: 64.5 in
S' Lateral: 3.1 cm
Weight: 2550.28 [oz_av]

## 2023-11-29 LAB — MAGNESIUM: Magnesium: 1.7 mg/dL (ref 1.7–2.4)

## 2023-11-29 MED ORDER — SULFAMETHOXAZOLE-TRIMETHOPRIM 400-80 MG PO TABS
1.0000 | ORAL_TABLET | Freq: Two times a day (BID) | ORAL | Status: DC
  Filled 2023-11-29: qty 1

## 2023-11-29 MED ORDER — METOPROLOL SUCCINATE ER 25 MG PO TB24: 25.0000 mg | ORAL_TABLET | Freq: Every day | ORAL | 6 refills | Status: DC

## 2023-11-29 MED ORDER — SULFAMETHOXAZOLE-TRIMETHOPRIM 800-160 MG PO TABS: 0.5000 | ORAL_TABLET | Freq: Two times a day (BID) | ORAL | Status: AC

## 2023-11-29 NOTE — Progress Notes (Signed)
   11/29/23 0800  Assess: MEWS Score  Temp 98.1 F (36.7 C)  BP 118/79  MAP (mmHg) 91  Pulse Rate 98  ECG Heart Rate (!) 114  Resp (!) 21  SpO2 95 %  Assess: MEWS Score  MEWS Temp 0  MEWS Systolic 0  MEWS Pulse 2  MEWS RR 1  MEWS LOC 0  MEWS Score 3  MEWS Score Color Yellow  Assess: if the MEWS score is Yellow or Red  Were vital signs accurate and taken at a resting state? Yes  Notify: Charge Nurse/RN  Name of Charge Nurse/RN Notified jen  Assess: SIRS CRITERIA  SIRS Temperature  0  SIRS Respirations  1  SIRS Pulse 1  SIRS WBC 0  SIRS Score Sum  2

## 2023-11-29 NOTE — Plan of Care (Signed)
  Problem: Education: Goal: Knowledge of General Education information will improve Description: Including pain rating scale, medication(s)/side effects and non-pharmacologic comfort measures 11/29/2023 1140 by Dallas Breeding, RN Outcome: Adequate for Discharge 11/29/2023 1140 by Dallas Breeding, RN Outcome: Adequate for Discharge 11/29/2023 1139 by Dallas Breeding, RN Outcome: Progressing 11/29/2023 1034 by Dallas Breeding, RN Outcome: Progressing   Problem: Health Behavior/Discharge Planning: Goal: Ability to manage health-related needs will improve 11/29/2023 1140 by Dallas Breeding, RN Outcome: Adequate for Discharge 11/29/2023 1140 by Dallas Breeding, RN Outcome: Adequate for Discharge   Problem: Clinical Measurements: Goal: Ability to maintain clinical measurements within normal limits will improve 11/29/2023 1140 by Dallas Breeding, RN Outcome: Adequate for Discharge 11/29/2023 1140 by Dallas Breeding, RN Outcome: Adequate for Discharge 11/29/2023 1034 by Dallas Breeding, RN Outcome: Progressing Goal: Will remain free from infection 11/29/2023 1140 by Dallas Breeding, RN Outcome: Adequate for Discharge 11/29/2023 1140 by Dallas Breeding, RN Outcome: Adequate for Discharge Goal: Diagnostic test results will improve 11/29/2023 1140 by Dallas Breeding, RN Outcome: Adequate for Discharge 11/29/2023 1140 by Dallas Breeding, RN Outcome: Adequate for Discharge Goal: Respiratory complications will improve 11/29/2023 1140 by Dallas Breeding, RN Outcome: Adequate for Discharge 11/29/2023 1140 by Dallas Breeding, RN Outcome: Adequate for Discharge 11/29/2023 1139 by Dallas Breeding, RN Outcome: Progressing Goal: Cardiovascular complication will be avoided 11/29/2023 1140 by Dallas Breeding, RN Outcome: Adequate for Discharge 11/29/2023 1140 by Dallas Breeding,  RN Outcome: Adequate for Discharge   Problem: Activity: Goal: Risk for activity intolerance will decrease 11/29/2023 1140 by Dallas Breeding, RN Outcome: Adequate for Discharge 11/29/2023 1140 by Dallas Breeding, RN Outcome: Adequate for Discharge 11/29/2023 1139 by Dallas Breeding, RN Outcome: Progressing 11/29/2023 1034 by Dallas Breeding, RN Outcome: Progressing   Problem: Nutrition: Goal: Adequate nutrition will be maintained 11/29/2023 1140 by Dallas Breeding, RN Outcome: Adequate for Discharge 11/29/2023 1140 by Dallas Breeding, RN Outcome: Adequate for Discharge 11/29/2023 1139 by Dallas Breeding, RN Outcome: Progressing 11/29/2023 1034 by Dallas Breeding, RN Outcome: Progressing   Problem: Coping: Goal: Level of anxiety will decrease 11/29/2023 1140 by Dallas Breeding, RN Outcome: Adequate for Discharge 11/29/2023 1140 by Dallas Breeding, RN Outcome: Adequate for Discharge 11/29/2023 1139 by Dallas Breeding, RN Outcome: Progressing 11/29/2023 1034 by Dallas Breeding, RN Outcome: Progressing   Problem: Elimination: Goal: Will not experience complications related to bowel motility 11/29/2023 1140 by Dallas Breeding, RN Outcome: Adequate for Discharge 11/29/2023 1140 by Dallas Breeding, RN Outcome: Adequate for Discharge Goal: Will not experience complications related to urinary retention 11/29/2023 1140 by Dallas Breeding, RN Outcome: Adequate for Discharge 11/29/2023 1140 by Dallas Breeding, RN Outcome: Adequate for Discharge   Problem: Pain Managment: Goal: General experience of comfort will improve and/or be controlled 11/29/2023 1140 by Dallas Breeding, RN Outcome: Adequate for Discharge 11/29/2023 1140 by Dallas Breeding, RN Outcome: Adequate for Discharge   Problem: Safety: Goal: Ability to remain free from injury will improve 11/29/2023 1140  by Dallas Breeding, RN Outcome: Adequate for Discharge 11/29/2023 1140 by Dallas Breeding, RN Outcome: Adequate for Discharge   Problem: Skin Integrity: Goal: Risk for impaired skin integrity will decrease 11/29/2023 1140 by Dallas Breeding, RN Outcome: Adequate for Discharge 11/29/2023 1140 by Dallas Breeding, RN Outcome: Adequate for Discharge

## 2023-11-29 NOTE — Plan of Care (Signed)

## 2023-11-29 NOTE — Progress Notes (Signed)
 Mobility Specialist Progress Note:   11/29/23 1131  Mobility  Activity Ambulated with assistance in room;Ambulated with assistance in hallway  Level of Assistance Contact guard assist, steadying assist  Assistive Device Cane  Distance Ambulated (ft) 450 ft  Activity Response Tolerated well  Mobility Referral Yes  Mobility visit 1 Mobility  Mobility Specialist Start Time (ACUTE ONLY) 1115  Mobility Specialist Stop Time (ACUTE ONLY) 1125  Mobility Specialist Time Calculation (min) (ACUTE ONLY) 10 min   Pt received in bed, family at bedside. Agreeable to ambulate in hallway, CGA for safety with cane. Tolerated well, asx throughout. One incident of LOB, pt able to correct with assistance via gait belt. Max HR 123 bpm. Returned pt to room, all needs met, call bell in reach.   Feliciana Rossetti Mobility Specialist Please contact via Special educational needs teacher or  Rehab office at [phone]

## 2023-11-29 NOTE — Progress Notes (Signed)
 Discussed with pt and daughter restrictions, walking at home for exercise, and CRPII. Pt receptive. He will be interested in [facility] in April after he gets knee injections. Will refer to [place] CRPII.  [id number] Ethelda Chick BS, ACSM-CEP 11/29/2023 9:59 AM

## 2023-11-29 NOTE — Progress Notes (Signed)
 DISCHARGE NOTE HOME Thomas [name] to be discharged Home per MD order. Discussed prescriptions and follow up appointments with the patient. Prescriptions given to patient; medication list explained in detail. Patient verbalized understanding.  Skin clean, dry and intact without evidence of skin break down, no evidence of skin tears noted. IV catheter discontinued intact. Site without signs and symptoms of complications. Dressing and pressure applied. Pt denies pain at the site currently. No complaints noted.  Patient free of lines, drains, and wounds.   An After Visit Summary (AVS) was printed and given to the patient. Patient escorted via wheelchair, and discharged home via private auto.  Velia Meyer, RN

## 2023-11-29 NOTE — Progress Notes (Signed)
   11/29/23 1150  TOC Brief Assessment  Insurance and Status Reviewed  Patient has primary care physician Yes  Home environment has been reviewed home w/ spouse  Prior level of function: independent  Prior/Current Home Services No current home services  Social Drivers of Health Review SDOH reviewed no interventions necessary  Readmission risk has been reviewed Yes  Transition of care needs no transition of care needs at this time    Pt s/p TAVR, stable for transition home today, no HH or DME needs noted. Family to transport home

## 2023-11-29 NOTE — Progress Notes (Signed)
   11/29/23 0900  Assess: MEWS Score  Level of Consciousness Alert  Assess: MEWS Score  MEWS Temp 0  MEWS Systolic 0  MEWS Pulse 2  MEWS RR 1  MEWS LOC 0  MEWS Score 3  MEWS Score Color Yellow  Assess: if the MEWS score is Yellow or Red  Were vital signs accurate and taken at a resting state? Yes  Notify: Charge Nurse/RN  Name of Charge Nurse/RN Notified jen

## 2023-11-29 NOTE — Progress Notes (Signed)
  Echocardiogram 2D Echocardiogram has been performed.  Delcie Roch 11/29/2023, 8:57 AM

## 2023-11-29 NOTE — Plan of Care (Signed)
  Problem: Education: Goal: Knowledge of General Education information will improve Description: Including pain rating scale, medication(s)/side effects and non-pharmacologic comfort measures 11/29/2023 1139 by Dallas Breeding, RN Outcome: Progressing 11/29/2023 1034 by Dallas Breeding, RN Outcome: Progressing   Problem: Clinical Measurements: Goal: Ability to maintain clinical measurements within normal limits will improve Outcome: Progressing Goal: Respiratory complications will improve Outcome: Progressing   Problem: Activity: Goal: Risk for activity intolerance will decrease 11/29/2023 1139 by Dallas Breeding, RN Outcome: Progressing 11/29/2023 1034 by Dallas Breeding, RN Outcome: Progressing   Problem: Nutrition: Goal: Adequate nutrition will be maintained 11/29/2023 1139 by Dallas Breeding, RN Outcome: Progressing 11/29/2023 1034 by Dallas Breeding, RN Outcome: Progressing   Problem: Coping: Goal: Level of anxiety will decrease 11/29/2023 1139 by Dallas Breeding, RN Outcome: Progressing 11/29/2023 1034 by Dallas Breeding, RN Outcome: Progressing

## 2023-11-29 NOTE — Plan of Care (Signed)
  Problem: Education: Goal: Knowledge of General Education information will improve Description: Including pain rating scale, medication(s)/side effects and non-pharmacologic comfort measures 11/29/2023 1140 by Dallas Breeding, RN Outcome: Adequate for Discharge 11/29/2023 1139 by Dallas Breeding, RN Outcome: Progressing 11/29/2023 1034 by Dallas Breeding, RN Outcome: Progressing   Problem: Health Behavior/Discharge Planning: Goal: Ability to manage health-related needs will improve Outcome: Adequate for Discharge   Problem: Clinical Measurements: Goal: Ability to maintain clinical measurements within normal limits will improve 11/29/2023 1140 by Dallas Breeding, RN Outcome: Adequate for Discharge 11/29/2023 1034 by Dallas Breeding, RN Outcome: Progressing Goal: Will remain free from infection Outcome: Adequate for Discharge Goal: Diagnostic test results will improve Outcome: Adequate for Discharge Goal: Respiratory complications will improve 11/29/2023 1140 by Dallas Breeding, RN Outcome: Adequate for Discharge 11/29/2023 1139 by Dallas Breeding, RN Outcome: Progressing Goal: Cardiovascular complication will be avoided Outcome: Adequate for Discharge   Problem: Activity: Goal: Risk for activity intolerance will decrease 11/29/2023 1140 by Dallas Breeding, RN Outcome: Adequate for Discharge 11/29/2023 1139 by Dallas Breeding, RN Outcome: Progressing 11/29/2023 1034 by Dallas Breeding, RN Outcome: Progressing   Problem: Nutrition: Goal: Adequate nutrition will be maintained 11/29/2023 1140 by Dallas Breeding, RN Outcome: Adequate for Discharge 11/29/2023 1139 by Dallas Breeding, RN Outcome: Progressing 11/29/2023 1034 by Dallas Breeding, RN Outcome: Progressing   Problem: Coping: Goal: Level of anxiety will decrease 11/29/2023 1140 by Dallas Breeding, RN Outcome: Adequate for  Discharge 11/29/2023 1139 by Dallas Breeding, RN Outcome: Progressing 11/29/2023 1034 by Dallas Breeding, RN Outcome: Progressing   Problem: Elimination: Goal: Will not experience complications related to bowel motility Outcome: Adequate for Discharge Goal: Will not experience complications related to urinary retention Outcome: Adequate for Discharge   Problem: Pain Managment: Goal: General experience of comfort will improve and/or be controlled Outcome: Adequate for Discharge   Problem: Safety: Goal: Ability to remain free from injury will improve Outcome: Adequate for Discharge   Problem: Skin Integrity: Goal: Risk for impaired skin integrity will decrease Outcome: Adequate for Discharge   Problem: Skin Integrity: Goal: Risk for impaired skin integrity will decrease Outcome: Adequate for Discharge

## 2023-11-30 ENCOUNTER — Telehealth: Payer: Self-pay | Admitting: Physician Assistant

## 2023-11-30 NOTE — Telephone Encounter (Signed)
  [facility]   MULTIDISCIPLINARY HEART VALVE TEAM   Patient contacted regarding discharge from [facility] on 11/29/23  Patient understands to follow up with a structural heart APP on 12/06/23 at [facility].  Patient understands discharge instructions? yes Patient understands medications and regimen? yes Patient understands to bring all medications to this visit? yes  Cline Crock PA-C  [facility]

## 2023-12-04 LAB — POCT I-STAT, CHEM 8
BUN: 24 mg/dL — ABNORMAL HIGH (ref 8–23)
BUN: 26 mg/dL — ABNORMAL HIGH (ref 8–23)
Calcium, Ion: 1.32 mmol/L (ref 1.15–1.40)
Calcium, Ion: 1.35 mmol/L (ref 1.15–1.40)
Chloride: 105 mmol/L (ref 98–111)
Chloride: 106 mmol/L (ref 98–111)
Creatinine, Ser: 1.9 mg/dL — ABNORMAL HIGH (ref 0.61–1.24)
Creatinine, Ser: 2 mg/dL — ABNORMAL HIGH (ref 0.61–1.24)
Glucose, Bld: 153 mg/dL — ABNORMAL HIGH (ref 70–99)
Glucose, Bld: 163 mg/dL — ABNORMAL HIGH (ref 70–99)
HCT: 38 % — ABNORMAL LOW (ref 39.0–52.0)
HCT: 40 % (ref 39.0–52.0)
Hemoglobin: 12.9 g/dL — ABNORMAL LOW (ref 13.0–17.0)
Hemoglobin: 13.6 g/dL (ref 13.0–17.0)
Potassium: 4.7 mmol/L (ref 3.5–5.1)
Potassium: 4.7 mmol/L (ref 3.5–5.1)
Sodium: 134 mmol/L — ABNORMAL LOW (ref 135–145)
Sodium: 134 mmol/L — ABNORMAL LOW (ref 135–145)
TCO2: 19 mmol/L — ABNORMAL LOW (ref 22–32)
TCO2: 20 mmol/L — ABNORMAL LOW (ref 22–32)

## 2023-12-05 NOTE — Progress Notes (Unsigned)
 [facility]   MULTIDISCIPLINARY HEART VALVE CLINIC                                     Cardiology Office Note:    Date:  12/07/2023   ID:  FRAZER RAINVILLE, DOB 05-Nov-1934, MRN [id number]  PCP:  [name], MD  [facility] Cardiologist:  [name], MD  [facility] Electrophysiologist:  None   Referring MD: [name], MD   [facility] s/p TAVR  History of Present Illness:    Thomas [name] is a 89 y.o. 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  He has been followed by Dr Excell Seltzer since Dr. Michaelle Copas retirement. He has known aortic stenosis. Echo 09/08/23 showed EF 60-65%, normal RV function, severe calcification and restriction of the aortic valve with moderate aortic regurgitation and findings consistent with severe paradoxical low-flow low gradient aortic stenosis. The peak transaortic velocity was 3.5 m/s, mean gradient 26 mmHg, AVA 0.8 cm, DVI 0.24, and SVI 29. L/RHC 11/01/23 showed widely patent coronary arteries with minimal irregularity present, no stenoses, right dominant coronary circulation and normal right heart hemodynamics with persevered cardiac output. He underwent successful TAVR with a 29 mm Edwards Sapien 3 Ultra Resilia THV via the TF approach on 11/28/23. Post operative echo showed EF 60%, mild RVE, normally functioning TAVR with a mean gradient of 5 mmHg and mild PVL as well as mildy dilated aorta ~61mm. He has chronic atrial fibrillation and his heart rates were noted to be elevated during admission. He was started on Toprol XL. Additionally, his creat was noted to be elevated from baseline ~ 2.    Today the patient presents to clinic for follow up. Here is here  with his son. Had some blood in his urine for a few days after surgery but this has now cleared. It did take him a little bit to recover from the anesthesia. He had some dizziness with the Toprol XL and subsequently discontinued it. He has he has not tolerated this well in the past. No CP or SOB. No LE edema, orthopnea or PND. No syncope. No blood in stool or urine. No palpitations.     Past Medical History:  Diagnosis Date   BPH (benign prostatic hypertrophy)    Chronic diastolic heart failure (HCC) 12/27/2013   Chronic kidney disease    Hx of bilateral renal cysts.   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 Excell Seltzer and Dr. Leafy Ro   Severe aortic stenosis    Sinus node dysfunction (HCC)  08/14/2014   Sleep apnea    Smoldering multiple myeloma      Current Medications: Current Meds  Medication Sig   acetaminophen (TYLENOL) 500 MG tablet Take 1,000 mg by mouth 2 (two) times daily as needed for mild pain (pain score 1-3) or moderate pain (pain score 4-6).   apixaban (ELIQUIS) 2.5 MG TABS tablet Take 1 tablet (2.5 mg total) by mouth 2 (two) times daily.   azithromycin (ZITHROMAX) 500 MG tablet Take 1 tablet (500 mg total) by mouth as directed. Take one tablet 1 hour before any dental work including cleanings.   cholecalciferol (VITAMIN D3) 25 MCG (1000 UNIT) tablet Take 1,000 Units by mouth daily.   dorzolamide-timolol (COSOPT) 22.3-6.8  MG/ML ophthalmic solution Place 1 drop into both eyes 2 (two) times daily.   latanoprost (XALATAN) 0.005 % ophthalmic solution INSTILL 1 DROP INTO BOTH EYES EVERY DAY AT NIGHT      ROS:   Please see the history of present illness.    All other systems reviewed and are negative.  EKGs   EKG Interpretation Date/Time:  Wednesday December 06 2023 14:13:42 EST Ventricular Rate:  90 PR Interval:    QRS Duration:  84 QT Interval:  348 QTC Calculation: 425 R Axis:   20  Text Interpretation: Atrial fibrillation with PVC Confirmed by Cline Crock [phone]) on 12/06/2023 2:35:28 PM   Risk Assessment/Calculations:    CHA2DS2-VASc Score = 4   This indicates a 4.8% annual risk of stroke. The patient's score is based upon: CHF History: 1 HTN History: 1 Diabetes History: 0 Stroke History: 0 Vascular Disease History: 0 Age Score: 2 Gender Score: 0          Physical Exam:    VS:  BP 120/70   Pulse 90   Ht 5' 4.75" (1.645 m)   Wt 161 lb (73 kg)   BMI 27.00 kg/m     Wt Readings from Last 3 Encounters:  12/06/23 161 lb (73 kg)  11/29/23 159 lb 6.3 oz (72.3 kg)  11/16/23 165 lb (74.8 kg)     GEN: Well nourished, well developed in no acute distress NECK: No JVD CARDIAC: irreg irreg, no murmurs, rubs, gallops RESPIRATORY:  Clear to auscultation without rales, wheezing or rhonchi  ABDOMEN: Soft, non-tender, non-distended EXTREMITIES:  No edema; No deformity.  Groin sites clear without hematoma or ecchymosis.   ASSESSMENT:    1. S/P TAVR (transcatheter aortic valve replacement)   2. Persistent atrial fibrillation (HCC)   3. Chronic diastolic heart failure (HCC)   4. Stage 3b chronic kidney disease (HCC)   5. Hyponatremia   6. Thoracic aortic aneurysm without rupture, unspecified part (HCC)   7. Malignant neoplasm of prostate (HCC)   8. Multiple myeloma not having achieved remission (HCC)     PLAN:    In order of problems listed above:  Severe AS s/p TAVR:  -- Pt  doing well s/p TAVR.  -- ECG with no HAVB.  -- Groin sites healing well.  -- SBE prophylaxis discussed; I have RX'd azithromycin due to a PCN allergy.  -- Continue on Eliquis 2.5mg  BID -- Cleared to resume all activities without restriction. -- I will see back for 1 month echo and OV.  Permanent atrial fibrillation:  -- Rate well controlled. -- Pt stopped Toprol XL 25mg  daily due to dizziness. Hrs in 90s today. -- Continue Eliquis 2.5mg  BID (dose reduced based on his age and kidney dysfunction).   Chronic diastolic CHF: -- Appears euvolemic.  --  Not on any diuretic therapy.  CKD stage IIIb: -- Previous creat baseline 1.36-1.5. -- Creat up to 1.97 at discharge and he was gently hydrated. .  -- Not on any nephrotoxic agents.  -- BMET today -- If creat remains elevated, will get him back in with nephrology (remotely saw Dr. Lynda Rainwater).   Hyponatremia:  -- NA 132 at discharge. -- BMET today.   TAA:  -- Pre TAVR CT showed dilatation of the ascending aorta to 4.1 cm.  -- This will be followed over time.     Metastatic prostate cancer:  -- Followed by Dr. Sofie Hartigan. -- He did have some mild hematuria that has now resolved.    Indolent multiple myeloma:  -- Followed by Dr. Sofie Hartigan.     Cardiac Rehabilitation Eligibility Assessment  The patient is ready to start cardiac rehabilitation from a cardiac standpoint.     Medication Adjustments/Labs and Tests Ordered: Current medicines are reviewed at length with the patient today.  Concerns regarding medicines are outlined above.  Orders Placed This Encounter  Procedures   Basic Metabolic Panel (BMET)   EKG 12-Lead   Meds ordered this encounter  Medications   azithromycin (ZITHROMAX) 500 MG tablet    Sig: Take 1 tablet (500 mg total) by mouth as directed. Take one tablet 1 hour before any dental work including cleanings.    Dispense:  6 tablet    Refill:  12    Supervising Provider:   [name] [[id number]]    Patient  Instructions  Medication Instructions:  Stop Toprol XL (metoprolol succinate) *If you need a refill on your cardiac medications before your next appointment, please call your pharmacy*   Lab Work: Go to any [facility] for blood work Designer, jewellery)   Testing/Procedures: none   Follow-Up: As planned         Signed, Cline Crock, Cordelia Poche  12/07/2023 10:37 AM    [facility]

## 2023-12-06 ENCOUNTER — Ambulatory Visit: Payer: Medicare Other | Attending: Physician Assistant | Admitting: Physician Assistant

## 2023-12-06 VITALS — BP 120/70 | HR 90 | Ht 64.75 in | Wt 161.0 lb

## 2023-12-06 DIAGNOSIS — N1832 Chronic kidney disease, stage 3b: Secondary | ICD-10-CM | POA: Diagnosis not present

## 2023-12-06 DIAGNOSIS — Z952 Presence of prosthetic heart valve: Secondary | ICD-10-CM

## 2023-12-06 DIAGNOSIS — C9000 Multiple myeloma not having achieved remission: Secondary | ICD-10-CM | POA: Diagnosis not present

## 2023-12-06 DIAGNOSIS — I4819 Other persistent atrial fibrillation: Secondary | ICD-10-CM

## 2023-12-06 DIAGNOSIS — I712 Thoracic aortic aneurysm, without rupture, unspecified: Secondary | ICD-10-CM

## 2023-12-06 DIAGNOSIS — I5032 Chronic diastolic (congestive) heart failure: Secondary | ICD-10-CM | POA: Diagnosis not present

## 2023-12-06 DIAGNOSIS — E871 Hypo-osmolality and hyponatremia: Secondary | ICD-10-CM | POA: Diagnosis not present

## 2023-12-06 DIAGNOSIS — C61 Malignant neoplasm of prostate: Secondary | ICD-10-CM

## 2023-12-06 MED ORDER — AZITHROMYCIN 500 MG PO TABS: 500.0000 mg | ORAL_TABLET | ORAL | 12 refills | Status: DC

## 2023-12-06 NOTE — Patient Instructions (Signed)
 Medication Instructions:  Stop Toprol XL (metoprolol succinate) *If you need a refill on your cardiac medications before your next appointment, please call your pharmacy*   Lab Work: Go to any [facility] for blood work Designer, jewellery)   Testing/Procedures: none   Follow-Up: As planned

## 2023-12-07 DIAGNOSIS — M170 Bilateral primary osteoarthritis of knee: Secondary | ICD-10-CM | POA: Diagnosis not present

## 2023-12-11 DIAGNOSIS — N1832 Chronic kidney disease, stage 3b: Secondary | ICD-10-CM | POA: Diagnosis not present

## 2023-12-11 DIAGNOSIS — I5032 Chronic diastolic (congestive) heart failure: Secondary | ICD-10-CM | POA: Diagnosis not present

## 2023-12-12 ENCOUNTER — Telehealth (HOSPITAL_COMMUNITY): Payer: Self-pay

## 2023-12-12 LAB — BASIC METABOLIC PANEL
BUN/Creatinine Ratio: 22 (ref 10–24)
BUN: 27 mg/dL (ref 8–27)
CO2: 21 mmol/L (ref 20–29)
Calcium: 9.7 mg/dL (ref 8.6–10.2)
Chloride: 107 mmol/L — ABNORMAL HIGH (ref 96–106)
Creatinine, Ser: 1.24 mg/dL (ref 0.76–1.27)
Glucose: 118 mg/dL — ABNORMAL HIGH (ref 70–99)
Potassium: 4.4 mmol/L (ref 3.5–5.2)
Sodium: 141 mmol/L (ref 134–144)
eGFR: 57 mL/min/{1.73_m2} — ABNORMAL LOW (ref >59)

## 2023-12-12 NOTE — Telephone Encounter (Signed)
 Pt insurance is active and benefits verified through [facility]. Co-pay $0.00, DED $0.00/$0.00 met, out of pocket $3,900.00/$565.39 met, co-insurance 0%. No pre-authorization required. Passport, 12/12/23 @ 1:43PM, REF#[id number]   How many CR sessions are covered? (36 visits for TCR, 72 visits for ICR)72 Is this a lifetime maximum or an annual maximum? Annual Has the member used any of these services to date? No Is there a time limit (weeks/months) on start of program and/or program completion? No     Will contact patient to see if he is interested in the Cardiac Rehab Program. If interested, patient will need to complete follow up appt. Once completed, patient will be contacted for scheduling upon review by the RN Navigator.

## 2023-12-14 DIAGNOSIS — M170 Bilateral primary osteoarthritis of knee: Secondary | ICD-10-CM | POA: Diagnosis not present

## 2023-12-15 ENCOUNTER — Telehealth (HOSPITAL_COMMUNITY): Payer: Self-pay

## 2023-12-15 NOTE — Telephone Encounter (Signed)
 Called and spoke with pt in regards to CR, pt stated he is not ready to participate in CR at this time.   Closed referral

## 2023-12-21 ENCOUNTER — Ambulatory Visit: Payer: Medicare Other | Admitting: Cardiovascular Disease

## 2023-12-21 DIAGNOSIS — M170 Bilateral primary osteoarthritis of knee: Secondary | ICD-10-CM | POA: Diagnosis not present

## 2023-12-27 ENCOUNTER — Ambulatory Visit (INDEPENDENT_AMBULATORY_CARE_PROVIDER_SITE_OTHER)

## 2023-12-27 ENCOUNTER — Ambulatory Visit: Payer: Medicare Other | Admitting: Physician Assistant

## 2023-12-27 ENCOUNTER — Ambulatory Visit (HOSPITAL_COMMUNITY): Payer: Medicare Other | Attending: Cardiology

## 2023-12-27 VITALS — BP 130/70 | HR 75 | Ht 64.0 in | Wt 165.4 lb

## 2023-12-27 DIAGNOSIS — C61 Malignant neoplasm of prostate: Secondary | ICD-10-CM | POA: Diagnosis present

## 2023-12-27 DIAGNOSIS — C9000 Multiple myeloma not having achieved remission: Secondary | ICD-10-CM

## 2023-12-27 DIAGNOSIS — R55 Syncope and collapse: Secondary | ICD-10-CM

## 2023-12-27 DIAGNOSIS — R5383 Other fatigue: Secondary | ICD-10-CM | POA: Insufficient documentation

## 2023-12-27 DIAGNOSIS — I5032 Chronic diastolic (congestive) heart failure: Secondary | ICD-10-CM | POA: Diagnosis not present

## 2023-12-27 DIAGNOSIS — I712 Thoracic aortic aneurysm, without rupture, unspecified: Secondary | ICD-10-CM | POA: Diagnosis not present

## 2023-12-27 DIAGNOSIS — Z952 Presence of prosthetic heart valve: Secondary | ICD-10-CM | POA: Diagnosis not present

## 2023-12-27 DIAGNOSIS — I4819 Other persistent atrial fibrillation: Secondary | ICD-10-CM | POA: Diagnosis not present

## 2023-12-27 DIAGNOSIS — E871 Hypo-osmolality and hyponatremia: Secondary | ICD-10-CM

## 2023-12-27 DIAGNOSIS — N1832 Chronic kidney disease, stage 3b: Secondary | ICD-10-CM

## 2023-12-27 LAB — ECHOCARDIOGRAM COMPLETE
AR max vel: 2.86 cm2
AV Area VTI: 2.59 cm2
AV Area mean vel: 2.62 cm2
AV Mean grad: 3.6 mmHg
AV Peak grad: 6.2 mmHg
Ao pk vel: 1.24 m/s
Area-P 1/2: 3.87 cm2
MV M vel: 5.53 m/s
MV Peak grad: 122.3 mmHg
P 1/2 time: 372 ms
S' Lateral: 1.7 cm

## 2023-12-27 NOTE — Patient Instructions (Addendum)
 Medication Instructions:  Your physician recommends that you continue on your current medications as directed. Please refer to the Current Medication list given to you today.  *If you need a refill on your cardiac medications before your next appointment, please call your pharmacy*   Lab Work: NONE If you have labs (blood work) drawn today and your tests are completely normal, you will receive your results only by: MyChart Message (if you have MyChart) OR A paper copy in the mail If you have any lab test that is abnormal or we need to change your treatment, we will call you to review the results.   Testing/Procedures: Your physician has requested that you have an echocardiogram. Echocardiography is a painless test that uses sound waves to create images of your heart. It provides your doctor with information about the size and shape of your heart and how well your heart's chambers and valves are working. This procedure takes approximately one hour. There are no restrictions for this procedure. Please do NOT wear cologne, perfume, aftershave, or lotions (deodorant is allowed). Please arrive 15 minutes prior to your appointment time.  Please note: We ask at that you not bring children with you during ultrasound (echo/ vascular) testing. Due to room size and safety concerns, children are not allowed in the ultrasound rooms during exams. Our front office staff cannot provide observation of children in our lobby area while testing is being conducted. An adult accompanying a patient to their appointment will only be allowed in the ultrasound room at the discretion of the ultrasound technician under special circumstances. We apologize for any inconvenience.   ZIO XT- Long Term Monitor Instructions  Your physician has requested you wear a ZIO patch monitor for 14 days.  This is a single patch monitor. [employer] supplies one patch monitor per enrollment. Additional stickers are not available. Please do  not apply patch if you will be having a Nuclear Stress Test,  Echocardiogram, Cardiac CT, MRI, or Chest Xray during the period you would be wearing the  monitor. The patch cannot be worn during these tests. You cannot remove and re-apply the  ZIO XT patch monitor.  Your ZIO patch monitor will be mailed 3 day USPS to your address on file. It may take 3-5 days  to receive your monitor after you have been enrolled.  Once you have received your monitor, please review the enclosed instructions. Your monitor  has already been registered assigning a specific monitor serial # to you.  Billing and Patient Assistance Program Information  We have supplied [employer] with any of your insurance information on file for billing purposes. [employer] offers a sliding scale Patient Assistance Program for patients that do not have  insurance, or whose insurance does not completely cover the cost of the ZIO monitor.  You must apply for the Patient Assistance Program to qualify for this discounted rate.  To apply, please call [employer] at [phone], select option 4, select option 2, ask to apply for  Patient Assistance Program. Meredeth Ide will ask your household income, and how many people  are in your household. They will quote your out-of-pocket cost based on that information.  [employer] will also be able to set up a 67-month, interest-free payment plan if needed.  Applying the monitor   Shave hair from upper left chest.  Hold abrader disc by orange tab. Rub abrader in 40 strokes over the upper left chest as  indicated in your monitor instructions.  Clean area with 4 enclosed alcohol  pads. Let dry.  Apply patch as indicated in monitor instructions. Patch will be placed under collarbone on left  side of chest with arrow pointing upward.  Rub patch adhesive wings for 2 minutes. Remove white label marked "1". Remove the white  label marked "2". Rub patch adhesive wings for 2 additional minutes.  While looking in a  mirror, press and release button in center of patch. A small green light will  flash 3-4 times. This will be your only indicator that the monitor has been turned on.  Do not shower for the first 24 hours. You may shower after the first 24 hours.  Press the button if you feel a symptom. You will hear a small click. Record Date, Time and  Symptom in the Patient Logbook.  When you are ready to remove the patch, follow instructions on the last 2 pages of Patient  Logbook. Stick patch monitor onto the last page of Patient Logbook.  Place Patient Logbook in the blue and white box. Use locking tab on box and tape box closed  securely. The blue and white box has prepaid postage on it. Please place it in the mailbox as  soon as possible. Your physician should have your test results approximately 7 days after the  monitor has been mailed [facility] to [facility].  Call [facility] Customer Care at [phone] if you have questions regarding  your ZIO XT patch monitor. Call them immediately if you see an orange light blinking on your  monitor.  If your monitor falls off in less than 4 days, contact our Monitor department at [phone].  If your monitor becomes loose or falls off after 4 days call [employer] at [phone] for  suggestions on securing your monitor    Follow-Up: At [facility], you and your health needs are our priority.  As part of our continuing mission to provide you with exceptional heart care, we have created designated Provider Care Teams.  These Care Teams include your primary Cardiologist (physician) and Advanced Practice Providers (APPs -  Physician Assistants and Nurse Practitioners) who all work together to provide you with the care you need, when you need it.  We recommend signing up for the patient portal called "MyChart".  Sign up information is provided on this After Visit Summary.  MyChart is used to connect with patients for Virtual Visits (Telemedicine).   Patients are able to view lab/test results, encounter notes, upcoming appointments, etc.  Non-urgent messages can be sent to your provider as well.   To learn more about what you can do with MyChart, go to [email or web address].    Your next appointment:   KEEP SCHEDULED APPOINTMENT

## 2023-12-27 NOTE — Progress Notes (Unsigned)
 Applied a 14 day Zio XT monitor to patient in the office  Excell Seltzer to read

## 2023-12-27 NOTE — Progress Notes (Signed)
 [facility]   MULTIDISCIPLINARY HEART VALVE CLINIC                                     Cardiology Office Note:    Date:  12/27/2023   ID:  KYNDALL CHAPLIN, DOB 1935/08/19, MRN [id number]  PCP:  [name], MD  [facility] Cardiologist:  [name], MD  [facility] Electrophysiologist:  None   Referring MD: [name], MD   1 month s/p TAVR  History of Present Illness:    Thomas [name] is a 89 y.o. 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  He has been followed by Dr Excell Seltzer since Dr. Michaelle Copas retirement. He has known aortic stenosis. Echo 09/08/23 showed EF 60-65%, normal RV function, severe calcification and restriction of the aortic valve with moderate aortic regurgitation and findings consistent with severe paradoxical low-flow low gradient aortic stenosis. The peak transaortic velocity was 3.5 m/s, mean gradient 26 mmHg, AVA 0.8 cm, DVI 0.24, and SVI 29. L/RHC 11/01/23 showed widely patent coronary arteries with minimal irregularity present, no stenoses, right dominant coronary circulation and normal right heart hemodynamics with persevered cardiac output. He underwent successful TAVR with a 29 mm Edwards Sapien 3 Ultra Resilia THV via the TF approach on 11/28/23. Post operative echo showed EF 60%, mild RVE, normally functioning TAVR with a mean gradient of 5 mmHg and mild PVL as well as mildy dilated aorta ~72mm. He has chronic atrial fibrillation and his heart rates were noted to be elevated during admission. He was started on Toprol XL. Additionally, his creat was noted to be elevated from baseline ~ 2.    At follow up he discontinued Toprol XL given intolerance.  Had a slow recovery from anesthesia and mild hematuria. Follow up labs showed normalization of his creat to 1.24.  Today the patient presents to clinic for follow up. Here is here with his daughter. He has had worsening fatigue since his TAVR. Doesn't have a lot of "get up and go." He likes to work in his shop and hasn't been out there all winter long. Has to force himself to do things. Also has had some dizziness. No positional or related to position changes. Lasts a few seconds. Last Friday he had just come in from the garage and had a short episode of syncope after a severe dizzy spell. Review of old notes show this has been an ongoing issue. Has some chest pain related to a fall and rib injury. Hurts with palpation.     Past Medical History:  Diagnosis Date   BPH (benign prostatic hypertrophy)    Chronic diastolic heart failure (HCC) 12/27/2013   Chronic kidney disease    Hx of bilateral renal cysts.   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 Excell Seltzer and Dr. Leafy Ro   Severe aortic stenosis    Sinus node dysfunction (HCC) 08/14/2014   Sleep apnea    Smoldering multiple myeloma      Current Medications: Current Meds  Medication Sig   acetaminophen (TYLENOL) 500 MG tablet Take 1,000 mg by mouth 2 (two) times daily as needed for mild pain (pain score 1-3) or moderate pain (pain score 4-6).   apixaban (ELIQUIS) 2.5 MG TABS tablet Take  1 tablet (2.5 mg total) by mouth 2 (two) times daily.   azithromycin (ZITHROMAX) 500 MG tablet Take 1 tablet (500 mg total) by mouth as directed. Take one tablet 1 hour before any dental work including cleanings.   cholecalciferol (VITAMIN D3) 25 MCG (1000 UNIT) tablet Take 1,000 Units by mouth daily.   dorzolamide-timolol (COSOPT) 22.3-6.8 MG/ML ophthalmic solution Place 1 drop into both eyes 2 (two) times daily.   latanoprost (XALATAN) 0.005 % ophthalmic solution INSTILL 1 DROP INTO BOTH EYES EVERY DAY AT NIGHT      ROS:   Please see the history of present illness.    All other systems reviewed and are negative.  EKGs       Risk Assessment/Calculations:    CHA2DS2-VASc Score = 4   This indicates a 4.8% annual risk of stroke. The patient's score is based upon: CHF History: 1 HTN History: 1 Diabetes History: 0 Stroke History: 0 Vascular Disease History: 0 Age Score: 2 Gender Score: 0          Physical Exam:    VS:  BP 130/70 (BP Location: Right Arm)   Pulse 75   Ht 5\' 4"  (1.626 m)   Wt 165 lb 6.4 oz (75 kg)   SpO2 99%   BMI 28.39 kg/m     Wt Readings from Last 3 Encounters:  12/27/23 165 lb 6.4 oz (75 kg)  12/06/23 161 lb (73 kg)  11/29/23 159 lb 6.3 oz (72.3 kg)     GEN: Well nourished, well developed in no acute distress NECK: No JVD CARDIAC: irreg irreg, no murmurs, rubs, gallops RESPIRATORY:  Clear to auscultation without rales, wheezing or rhonchi  ABDOMEN: Soft, non-tender, non-distended EXTREMITIES:  No edema; No deformity.    ASSESSMENT:    1. S/P TAVR (transcatheter aortic valve replacement)   2. Persistent atrial fibrillation (HCC)   3. Chronic diastolic heart failure (HCC)   4. Stage 3b chronic kidney disease (HCC)   5. Hyponatremia   6. Thoracic aortic aneurysm without rupture, unspecified part (HCC)   7. Malignant neoplasm of prostate (HCC)   8. Multiple myeloma not having achieved remission (HCC)   9. Other fatigue   10. Syncope,  unspecified syncope type      PLAN:    In order of problems listed above:  Severe AS s/p TAVR:  -- Echo today shows EF 60%, normally functioning TAVR with a mean gradient of 3.6 mm hg and mild to moderate PVL as well as mild to moderate MR and mild dilatation of the ascending aorta. -- NYHA class II symptoms, mainly of fatigue.  -- SBE prophylaxis discussed; he has azithromycin due to a PCN allergy.  -- Continue on Eliquis 2.5mg  BID -- I will see back for 1 year and OV.  Permanent atrial fibrillation:  -- Rate well controlled. -- Pt stopped Toprol XL 25mg  daily due to dizziness. HRs in 70s today. -- Continue Eliquis 2.5mg  BID (he actually now qualifies for the higher dose w/ improved renal function, but has ongoing hematuria so I am hesitant to increase it).   Chronic diastolic CHF: -- Appears euvolemic.  -- Not on any diuretic therapy.  CKD stage IIIb: -- Previous creat baseline 1.36-1.5. -- Creat up to 1.97 at discharge and he was gently hydrated. .  -- Not on any nephrotoxic agents.  -- Repeat labs s/p TAVR showed improvement in renal function to 1.24.   Hyponatremia:  -- Resolved by most recent labs.    TAA:  -- Pre TAVR CT showed dilatation of the ascending aorta to 4.1 cm.  -- This will be followed over time.     Metastatic prostate cancer:  -- Followed by Dr. Sofie Hartigan. -- He has ongoing hematuria.   Indolent multiple myeloma:  -- Followed by Dr. Sofie Hartigan.  Fatigue:  -- This is his biggest complaint.  -- He has labs with Dr. Sofie Hartigan coming up. -- If labs and heart monitor (see below) are normal, suspect deconditioning.  Syncope: -- Has a long history of dizziness and syncope per review of records.  -- With fatigue, weakness and syncope, will get a heart monitor to rule out HAVB after TAVR, although my suspicion for this is low.     Medication Adjustments/Labs and Tests Ordered: Current medicines are reviewed at length with the patient today.  Concerns  regarding medicines are outlined above.  Orders Placed This Encounter  Procedures   LONG TERM MONITOR (3-14 DAYS)   LONG TERM MONITOR (3-14 DAYS)   No orders of the defined types were placed in this encounter.   Patient Instructions  Medication Instructions:  Your physician recommends that you continue on your current medications as directed. Please refer to the Current Medication list given to you today.  *If you need a refill on your cardiac medications before your next appointment, please call your pharmacy*   Lab Work: NONE If you have labs (blood work) drawn today and your tests are completely normal, you will receive your results only by: MyChart Message (if you have MyChart) OR A paper copy in the mail If you have any lab test that is abnormal or we need to change your treatment, we will call you to review the results.   Testing/Procedures: Your physician has requested that you have an echocardiogram. Echocardiography is a painless test that uses sound waves to create images of your heart. It provides your doctor with information about the size and shape of your heart and how well your heart's chambers and valves are working. This procedure takes approximately one hour. There are no restrictions for this procedure. Please do NOT wear cologne, perfume, aftershave, or lotions (deodorant is allowed). Please arrive 15 minutes prior to your appointment time.  Please note: We ask at that you not bring children with you during ultrasound (echo/ vascular) testing. Due to room size and safety concerns, children are not allowed in the ultrasound rooms during exams. Our front office staff cannot provide observation of children in our lobby area while testing is being conducted. An adult accompanying a patient to their appointment will only be allowed in the ultrasound room at the discretion of the ultrasound technician under special circumstances. We apologize for any inconvenience.   ZIO  XT- Long Term Monitor Instructions  Your physician has requested you  wear a ZIO patch monitor for 14 days.  This is a single patch monitor. [employer] supplies one patch monitor per enrollment. Additional stickers are not available. Please do not apply patch if you will be having a Nuclear Stress Test,  Echocardiogram, Cardiac CT, MRI, or Chest Xray during the period you would be wearing the  monitor. The patch cannot be worn during these tests. You cannot remove and re-apply the  ZIO XT patch monitor.  Your ZIO patch monitor will be mailed 3 day USPS to your address on file. It may take 3-5 days  to receive your monitor after you have been enrolled.  Once you have received your monitor, please review the enclosed instructions. Your monitor  has already been registered assigning a specific monitor serial # to you.  Billing and Patient Assistance Program Information  We have supplied [employer] with any of your insurance information on file for billing purposes. [employer] offers a sliding scale Patient Assistance Program for patients that do not have  insurance, or whose insurance does not completely cover the cost of the ZIO monitor.  You must apply for the Patient Assistance Program to qualify for this discounted rate.  To apply, please call [employer] at [phone], select option 4, select option 2, ask to apply for  Patient Assistance Program. Meredeth Ide will ask your household income, and how many people  are in your household. They will quote your out-of-pocket cost based on that information.  [employer] will also be able to set up a 55-month, interest-free payment plan if needed.  Applying the monitor   Shave hair from upper left chest.  Hold abrader disc by orange tab. Rub abrader in 40 strokes over the upper left chest as  indicated in your monitor instructions.  Clean area with 4 enclosed alcohol pads. Let dry.  Apply patch as indicated in monitor instructions. Patch will be placed under  collarbone on left  side of chest with arrow pointing upward.  Rub patch adhesive wings for 2 minutes. Remove white label marked "1". Remove the white  label marked "2". Rub patch adhesive wings for 2 additional minutes.  While looking in a mirror, press and release button in center of patch. A small green light will  flash 3-4 times. This will be your only indicator that the monitor has been turned on.  Do not shower for the first 24 hours. You may shower after the first 24 hours.  Press the button if you feel a symptom. You will hear a small click. Record Date, Time and  Symptom in the Patient Logbook.  When you are ready to remove the patch, follow instructions on the last 2 pages of Patient  Logbook. Stick patch monitor onto the last page of Patient Logbook.  Place Patient Logbook in the blue and white box. Use locking tab on box and tape box closed  securely. The blue and white box has prepaid postage on it. Please place it in the mailbox as  soon as possible. Your physician should have your test results approximately 7 days after the  monitor has been mailed back to [facility].  Call [facility] Customer Care at [phone] if you have questions regarding  your ZIO XT patch monitor. Call them immediately if you see an orange light blinking on your  monitor.  If your monitor falls off in less than 4 days, contact our Monitor department at [phone].  If your monitor becomes loose or falls off after 4 days call [employer] at  [phone] for  suggestions on securing your monitor    Follow-Up: At [facility], you and your health needs are our priority.  As part of our continuing mission to provide you with exceptional heart care, we have created designated Provider Care Teams.  These Care Teams include your primary Cardiologist (physician) and Advanced Practice Providers (APPs -  Physician Assistants and Nurse Practitioners) who all work together to provide you with  the care you need, when you need it.  We recommend signing up for the patient portal called "MyChart".  Sign up information is provided on this After Visit Summary.  MyChart is used to connect with patients for Virtual Visits (Telemedicine).  Patients are able to view lab/test results, encounter notes, upcoming appointments, etc.  Non-urgent messages can be sent to your provider as well.   To learn more about what you can do with MyChart, go to [email or web address].    Your next appointment:   KEEP SCHEDULED APPOINTMENT        Signed, Cline Crock, PA-C  12/27/2023 4:22 PM    [facility]

## 2024-01-03 ENCOUNTER — Telehealth: Payer: Self-pay | Admitting: Internal Medicine

## 2024-01-03 NOTE — Telephone Encounter (Signed)
 Rescheduled follow up appointment per provider on PAL. The patient is aware of the changes made and will be mailed an appointment reminder.

## 2024-01-16 ENCOUNTER — Other Ambulatory Visit: Payer: Medicare Other

## 2024-01-16 ENCOUNTER — Inpatient Hospital Stay: Payer: Medicare Other | Attending: Internal Medicine

## 2024-01-16 DIAGNOSIS — Z79899 Other long term (current) drug therapy: Secondary | ICD-10-CM | POA: Insufficient documentation

## 2024-01-16 DIAGNOSIS — C61 Malignant neoplasm of prostate: Secondary | ICD-10-CM | POA: Insufficient documentation

## 2024-01-16 DIAGNOSIS — N1832 Chronic kidney disease, stage 3b: Secondary | ICD-10-CM | POA: Diagnosis not present

## 2024-01-16 DIAGNOSIS — Z888 Allergy status to other drugs, medicaments and biological substances status: Secondary | ICD-10-CM | POA: Diagnosis not present

## 2024-01-16 DIAGNOSIS — R197 Diarrhea, unspecified: Secondary | ICD-10-CM | POA: Diagnosis not present

## 2024-01-16 DIAGNOSIS — Z7901 Long term (current) use of anticoagulants: Secondary | ICD-10-CM | POA: Insufficient documentation

## 2024-01-16 DIAGNOSIS — Z9049 Acquired absence of other specified parts of digestive tract: Secondary | ICD-10-CM | POA: Insufficient documentation

## 2024-01-16 DIAGNOSIS — G4733 Obstructive sleep apnea (adult) (pediatric): Secondary | ICD-10-CM | POA: Insufficient documentation

## 2024-01-16 DIAGNOSIS — Z9079 Acquired absence of other genital organ(s): Secondary | ICD-10-CM | POA: Insufficient documentation

## 2024-01-16 DIAGNOSIS — Z880 Allergy status to penicillin: Secondary | ICD-10-CM | POA: Insufficient documentation

## 2024-01-16 DIAGNOSIS — Z923 Personal history of irradiation: Secondary | ICD-10-CM | POA: Diagnosis not present

## 2024-01-16 DIAGNOSIS — Z885 Allergy status to narcotic agent status: Secondary | ICD-10-CM | POA: Diagnosis not present

## 2024-01-16 DIAGNOSIS — C9000 Multiple myeloma not having achieved remission: Secondary | ICD-10-CM | POA: Diagnosis not present

## 2024-01-16 DIAGNOSIS — N400 Enlarged prostate without lower urinary tract symptoms: Secondary | ICD-10-CM | POA: Diagnosis not present

## 2024-01-16 DIAGNOSIS — I5032 Chronic diastolic (congestive) heart failure: Secondary | ICD-10-CM | POA: Insufficient documentation

## 2024-01-16 DIAGNOSIS — Z881 Allergy status to other antibiotic agents status: Secondary | ICD-10-CM | POA: Insufficient documentation

## 2024-01-16 DIAGNOSIS — Z886 Allergy status to analgesic agent status: Secondary | ICD-10-CM | POA: Insufficient documentation

## 2024-01-16 DIAGNOSIS — Z7952 Long term (current) use of systemic steroids: Secondary | ICD-10-CM | POA: Insufficient documentation

## 2024-01-16 LAB — CBC WITH DIFFERENTIAL (CANCER CENTER ONLY)
Abs Immature Granulocytes: 0.06 10*3/uL (ref 0.00–0.07)
Basophils Absolute: 0 10*3/uL (ref 0.0–0.1)
Basophils Relative: 0 %
Eosinophils Absolute: 0.1 10*3/uL (ref 0.0–0.5)
Eosinophils Relative: 2 %
HCT: 38.4 % — ABNORMAL LOW (ref 39.0–52.0)
Hemoglobin: 13.2 g/dL (ref 13.0–17.0)
Immature Granulocytes: 1 %
Lymphocytes Relative: 24 %
Lymphs Abs: 1.3 10*3/uL (ref 0.7–4.0)
MCH: 31.9 pg (ref 26.0–34.0)
MCHC: 34.4 g/dL (ref 30.0–36.0)
MCV: 92.8 fL (ref 80.0–100.0)
Monocytes Absolute: 0.5 10*3/uL (ref 0.1–1.0)
Monocytes Relative: 9 %
Neutro Abs: 3.4 10*3/uL (ref 1.7–7.7)
Neutrophils Relative %: 64 %
Platelet Count: 125 10*3/uL — ABNORMAL LOW (ref 150–400)
RBC: 4.14 MIL/uL — ABNORMAL LOW (ref 4.22–5.81)
RDW: 14.7 % (ref 11.5–15.5)
WBC Count: 5.4 10*3/uL (ref 4.0–10.5)
nRBC: 0 % (ref 0.0–0.2)

## 2024-01-16 LAB — CMP (CANCER CENTER ONLY)
ALT: 14 U/L (ref 0–44)
AST: 22 U/L (ref 15–41)
Albumin: 4 g/dL (ref 3.5–5.0)
Alkaline Phosphatase: 98 U/L (ref 38–126)
Anion gap: 2 — ABNORMAL LOW (ref 5–15)
BUN: 24 mg/dL — ABNORMAL HIGH (ref 8–23)
CO2: 26 mmol/L (ref 22–32)
Calcium: 9.9 mg/dL (ref 8.9–10.3)
Chloride: 110 mmol/L (ref 98–111)
Creatinine: 1.49 mg/dL — ABNORMAL HIGH (ref 0.61–1.24)
GFR, Estimated: 45 mL/min — ABNORMAL LOW (ref >60)
Glucose, Bld: 106 mg/dL — ABNORMAL HIGH (ref 70–99)
Potassium: 4.5 mmol/L (ref 3.5–5.1)
Sodium: 138 mmol/L (ref 135–145)
Total Bilirubin: 0.8 mg/dL (ref 0.0–1.2)
Total Protein: 6.7 g/dL (ref 6.5–8.1)

## 2024-01-16 LAB — LACTATE DEHYDROGENASE: LDH: 325 U/L — ABNORMAL HIGH (ref 98–192)

## 2024-01-17 LAB — IGG, IGA, IGM
IgA: 79 mg/dL (ref 61–437)
IgG (Immunoglobin G), Serum: 1427 mg/dL (ref 603–1613)
IgM (Immunoglobulin M), Srm: 52 mg/dL (ref 15–143)

## 2024-01-17 LAB — KAPPA/LAMBDA LIGHT CHAINS
Kappa free light chain: 125.2 mg/L — ABNORMAL HIGH (ref 3.3–19.4)
Kappa, lambda light chain ratio: 8.03 — ABNORMAL HIGH (ref 0.26–1.65)
Lambda free light chains: 15.6 mg/L (ref 5.7–26.3)

## 2024-01-17 LAB — BETA 2 MICROGLOBULIN, SERUM: Beta-2 Microglobulin: 3.1 mg/L — ABNORMAL HIGH (ref 0.6–2.4)

## 2024-01-19 NOTE — Progress Notes (Unsigned)
 [facility] OFFICE PROGRESS NOTE  Thomas Grave, MD [id number] [address]. 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 2) History of pheochromocytoma LEFT adrenal gland status post resection on 03/16/2009 3) Prostate adenocarcinoma  PRIOR THERAPY: 1)Systemic therapy with weekly Velcade  1.3 mg/KG subcutaneously, Revlimid  25 mg p.o. daily for 21 days every 4 weeks as well as Decadron  20 mg p.o. weekly.  First dose November 07, 2017.  Status post 7 cycles. 2) definitive radiotherapy to the prostate cancer under the care of Dr. Lorri Rota.  CURRENT THERAPY: Observation   INTERVAL HISTORY: Thomas [name] 89 y.o. male returns to the clinic today for a follow up visit. The patient was last seen by Dr. Marguerita Shih in [place] 2024. The patient is seen every 6 months for hisotry of multiple myeloma and prostate cancer. He has been on observation and feeling well.   In the inteval since last being seen, he underwent a valve replacement. He tolerated this well and he states he feels better since having this performed.   He sees Dr. Rozanne Corners for his history of prostate cancer. He is scheduled for a follow up in May.   He denies fever, chills, night sweats, or unexplained weight loss. Denies nausea, vomiting, or constipation. He reports a few days of diarrhea. He denies any other symptoms such as abdominal pain. He denies recent antibiotic use or blood in the stool. He denies new medication. He denies signs of symptoms of infection including nasal congestion, sore throat, cough, shortness of breath, skin infections, and dysuria. He denies any new bone pain. He denies urinary changes.   He is here for evaluation and repeat blood work for his myeloma labs.   MEDICAL HISTORY: Past Medical History:  Diagnosis Date   BPH (benign prostatic hypertrophy)    Chronic diastolic heart  failure (HCC) 84/69/6295   Chronic kidney disease    Hx of bilateral renal cysts.   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 Arlester Ladd and Dr. Honey Lusty   Severe aortic stenosis    Sinus node dysfunction (HCC) 08/14/2014   Sleep apnea    Smoldering multiple myeloma     ALLERGIES:  is allergic to benadryl [diphenhydramine hcl], ceftriaxone sodium, nsaids, penicillins, farxiga  [dapagliflozin ], codeine, doxycycline, ibuprofen, levaquin [levofloxacin], oxybutynin, tramadol hcl, chlorhexidine, and furosemide .  MEDICATIONS:  Current Outpatient Medications  Medication Sig Dispense Refill   acetaminophen  (TYLENOL ) 500 MG tablet Take 1,000 mg by mouth 2 (two) times daily as needed for mild pain (pain score 1-3) or moderate pain (pain score 4-6).     apixaban  (ELIQUIS ) 2.5 MG TABS tablet Take 1 tablet (2.5 mg total) by mouth 2 (two) times daily. 180 tablet 1   azithromycin  (ZITHROMAX ) 500 MG tablet Take 1 tablet (500 mg  total) by mouth as directed. Take one tablet 1 hour before any dental work including cleanings. 6 tablet 12   cholecalciferol (VITAMIN D3) 25 MCG (1000 UNIT) tablet Take 1,000 Units by mouth daily.     dorzolamide -timolol  (COSOPT ) 22.3-6.8 MG/ML ophthalmic solution Place 1 drop into both eyes 2 (two) times daily.     latanoprost  (XALATAN ) 0.005 % ophthalmic solution INSTILL 1 DROP INTO BOTH EYES EVERY DAY AT NIGHT  6   No current  facility-administered medications for this visit.    SURGICAL HISTORY:  Past Surgical History:  Procedure Laterality Date   ADRENALECTOMY  02/12/11   Left, for Pheochromocytoma   CARDIOVERSION N/A 12/26/2013   Procedure: CARDIOVERSION;  Surgeon: Elmyra Haggard, MD;  Location: [facility] ENDOSCOPY;  Service: Cardiovascular;  Laterality: N/A;   CHOLECYSTECTOMY  02/12/11   GASTRECTOMY  1984   S/P gastrectomy for ulcer   INTRAOPERATIVE TRANSTHORACIC ECHOCARDIOGRAM N/A 11/28/2023   Procedure: INTRAOPERATIVE TRANSTHORACIC ECHOCARDIOGRAM;  Surgeon: Arnoldo Lapping, MD;  Location: [facility] INVASIVE CV LAB;  Service: Cardiovascular;  Laterality: N/A;   KNEE SURGERY  1971   S/P Right knee surgery   RIGHT/LEFT HEART CATH AND CORONARY ANGIOGRAPHY N/A 11/01/2023   Procedure: RIGHT/LEFT HEART CATH AND CORONARY ANGIOGRAPHY;  Surgeon: Arnoldo Lapping, MD;  Location: [facility] INVASIVE CV LAB;  Service: Cardiovascular;  Laterality: N/A;   TEE WITHOUT CARDIOVERSION N/A 12/26/2013   Procedure: TRANSESOPHAGEAL ECHOCARDIOGRAM (TEE);  Surgeon: Elmyra Haggard, MD;  Location: [facility] ENDOSCOPY;  Service: Cardiovascular;  Laterality: N/A;   TRANSURETHRAL RESECTION OF PROSTATE  7/12    REVIEW OF SYSTEMS:   Review of Systems  Constitutional: Negative for appetite change, chills, fatigue, fever and unexpected weight change.  HENT: Negative for mouth sores, nosebleeds, sore throat and trouble swallowing.   Eyes: Negative for eye problems and icterus.  Respiratory: Negative for cough, hemoptysis, shortness of breath and wheezing.   Cardiovascular: Negative for chest pain and leg swelling.  Gastrointestinal: Positive for some diarrhea. Negative for abdominal pain, constipation, nausea and vomiting.  Genitourinary: Negative for bladder incontinence, difficulty urinating, dysuria, frequency and hematuria.   Musculoskeletal: Negative for back pain, gait problem, neck pain and neck stiffness.  Skin: Negative for itching and rash.  Neurological:  Negative for dizziness, extremity weakness, gait problem, headaches, light-headedness and seizures.  Hematological: Negative for adenopathy. Does not bruise/bleed easily.  Psychiatric/Behavioral: Negative for confusion, depression and sleep disturbance. The patient is not nervous/anxious.     PHYSICAL EXAMINATION:  There were no vitals taken for this visit.  ECOG PERFORMANCE STATUS: 1  Physical Exam  Constitutional: Oriented to person, place, and time and well-developed, well-nourished, and in no distress.  HENT:  Head: Normocephalic and atraumatic.  Mouth/Throat: Oropharynx is clear and moist. No oropharyngeal exudate.  Eyes: Conjunctivae are normal. Right eye exhibits no discharge. Left eye exhibits no discharge. No scleral icterus.  Neck: Normal range of motion. Neck supple.  Cardiovascular: Normal rate and normal heart sounds and intact distal pulses.   Pulmonary/Chest: Effort normal and breath sounds normal. No respiratory distress. No wheezes. No rales.  Abdominal: Soft. Bowel sounds are normal. Exhibits no distension and no mass. There is no tenderness.  Musculoskeletal: Normal range of motion. Exhibits no edema.  Lymphadenopathy:    No cervical adenopathy.  Neurological: Alert and oriented to person, place, and time. Exhibits normal muscle tone. Gait normal. Coordination normal. Ambulates with a cane.  Skin: Skin is warm and dry. No rash noted. Not diaphoretic. No erythema. No pallor.  Psychiatric: Mood,  memory and judgment normal.  Vitals reviewed.  LABORATORY DATA: Lab Results  Component Value Date   WBC 5.4 01/16/2024   HGB 13.2 01/16/2024   HCT 38.4 (L) 01/16/2024   MCV 92.8 01/16/2024   PLT 125 (L) 01/16/2024      Chemistry      Component Value Date/Time   NA 138 01/16/2024 1324   NA 141 12/11/2023 1610   NA 138 08/16/2017 1404   K 4.5 01/16/2024 1324   K 4.5 08/16/2017 1404   CL 110 01/16/2024 1324   CL 109 (H) 03/21/2013 0958   CO2 26 01/16/2024 1324    CO2 24 08/16/2017 1404   BUN 24 (H) 01/16/2024 1324   BUN 27 12/11/2023 1610   BUN 17.3 08/16/2017 1404   CREATININE 1.49 (H) 01/16/2024 1324   CREATININE 1.8 (H) 08/16/2017 1404      Component Value Date/Time   CALCIUM  9.9 01/16/2024 1324   CALCIUM  9.9 08/16/2017 1404   ALKPHOS 98 01/16/2024 1324   ALKPHOS 86 08/16/2017 1404   AST 22 01/16/2024 1324   AST 22 08/16/2017 1404   ALT 14 01/16/2024 1324   ALT 16 08/16/2017 1404   BILITOT 0.8 01/16/2024 1324   BILITOT 0.78 08/16/2017 1404       RADIOGRAPHIC STUDIES:  ECHOCARDIOGRAM COMPLETE Result Date: 12/27/2023    ECHOCARDIOGRAM REPORT   Patient Name:   VA BROADWELL Date of Exam: 12/27/2023 Medical Rec #:  [id number]         Height:       64.8 in Accession #:    [id number]        Weight:       161.0 lb Date of Birth:  June 09, 1935         BSA:          1.799 m Patient Age:    88 years          BP:           120/70 mmHg Patient Gender: M                 HR:           92 bpm. Exam Location:  [address] Procedure: 2D Echo, 3D Echo, Cardiac Doppler and Color Doppler (Both Spectral            and Color Flow Doppler were utilized during procedure). Indications:    Z95.2 Status Post TAVR  History:        Patient has prior history of Echocardiogram examinations, most                 recent 11/29/2023. CHF, Aortic Valve Disease, Arrythmias:Atrial                 Fibrillation; Risk Factors:Hypertension and Dyslipidemia. Aortic                 Stenosis status post TAVR (11-28-23, 29mm Sharman Debar), Multiple                 Myeloma.                 Aortic Valve: 29 mm Sapien prosthetic, stented (TAVR) valve is                 present in the aortic position.  Sonographer:    Ewing Holiday RDCS Referring Phys: CYNTHIA WHITE IMPRESSIONS  1. Left ventricular ejection fraction, by estimation, is 60 to 65%. Left ventricular ejection fraction  by 3D volume is 63 %. The left ventricle has normal function. The left ventricle has no regional wall motion  abnormalities. Left ventricular diastolic  parameters are indeterminate. Elevated left ventricular end-diastolic pressure.  2. Right ventricular systolic function is normal. The right ventricular size is normal.  3. Left atrial size was severely dilated.  4. The mitral valve is degenerative. Mild to moderate mitral valve regurgitation. No evidence of mitral stenosis.  5. The aortic valve has been repaired/replaced. There is moderate perivalvular Aortic valve regurgitation. No aortic stenosis is present. There is a 29 mm Sapien prosthetic (TAVR) valve present in the aortic position. Aortic regurgitation PHT measures 372 msec. Aortic valve area, by VTI measures 2.59 cm. Aortic valve mean gradient measures 3.6 mmHg. Aortic valve Vmax measures 1.24 m/s.  6. There is mild dilatation of the ascending aorta, measuring 43 mm.  7. The inferior vena cava is normal in size with greater than 50% respiratory variability, suggesting right atrial pressure of 3 mmHg.  8. Compared to echo dated 09/08/23, there is now a normal functioning TAVR with moderate perivalvular AI present. FINDINGS  Left Ventricle: Left ventricular ejection fraction, by estimation, is 60 to 65%. Left ventricular ejection fraction by 3D volume is 63 %. The left ventricle has normal function. The left ventricle has no regional wall motion abnormalities. The left ventricular internal cavity size was normal in size. There is no left ventricular hypertrophy. Left ventricular diastolic parameters are indeterminate. Elevated left ventricular end-diastolic pressure. Right Ventricle: The right ventricular size is normal. No increase in right ventricular wall thickness. Right ventricular systolic function is normal. Left Atrium: Left atrial size was severely dilated. Right Atrium: Right atrial size was normal in size. Pericardium: There is no evidence of pericardial effusion. Mitral Valve: The mitral valve is degenerative in appearance. There is mild calcification of  the mitral valve leaflet(s). Mild mitral annular calcification. Mild to moderate mitral valve regurgitation. No evidence of mitral valve stenosis. Tricuspid Valve: The tricuspid valve is normal in structure. Tricuspid valve regurgitation is mild . No evidence of tricuspid stenosis. Aortic Valve: The aortic valve has been repaired/replaced. Aortic valve regurgitation is moderate. Aortic regurgitation PHT measures 372 msec. No aortic stenosis is present. Aortic valve mean gradient measures 3.6 mmHg. Aortic valve peak gradient measures 6.2 mmHg. Aortic valve area, by VTI measures 2.59 cm. There is a 29 mm Sapien prosthetic, stented (TAVR) valve present in the aortic position. Pulmonic Valve: The pulmonic valve was normal in structure. Pulmonic valve regurgitation is trivial. No evidence of pulmonic stenosis. Aorta: The aortic root is normal in size and structure. There is mild dilatation of the ascending aorta, measuring 43 mm. Venous: The inferior vena cava is normal in size with greater than 50% respiratory variability, suggesting right atrial pressure of 3 mmHg. IAS/Shunts: No atrial level shunt detected by color flow Doppler. Additional Comments: 3D was performed not requiring image post processing on an independent workstation and was normal.  LEFT VENTRICLE PLAX 2D LVIDd:         4.50 cm         Diastology LVIDs:         1.70 cm         LV e' medial:    6.31 cm/s LV PW:         0.90 cm         LV E/e' medial:  16.3 LV IVS:        0.90 cm  LV e' lateral:   8.16 cm/s LVOT diam:     2.70 cm         LV E/e' lateral: 12.6 LV SV:         65 LV SV Index:   36 LVOT Area:     5.73 cm        3D Volume EF                                LV 3D EF:    Left                                             ventricul                                             ar                                             ejection                                             fraction                                             by 3D                                              volume is                                             63 %.                                 3D Volume EF:                                3D EF:        63 %                                LV EDV:       113 ml                                LV ESV:       42 ml  LV SV:        72 ml RIGHT VENTRICLE RV Basal diam:  3.40 cm RV S prime:     19.60 cm/s TAPSE (M-mode): 1.9 cm RVSP:           28.0 mmHg LEFT ATRIUM             Index        RIGHT ATRIUM           Index LA Vol (A2C):   97.8 ml 54.36 ml/m  RA Pressure: 3.00 mmHg LA Vol (A4C):   93.9 ml 52.19 ml/m  RA Area:     18.60 cm LA Biplane Vol: 99.3 ml 55.19 ml/m  RA Volume:   50.70 ml  28.18 ml/m  AORTIC VALVE AV Area (Vmax):    2.86 cm AV Area (Vmean):   2.62 cm AV Area (VTI):     2.59 cm AV Vmax:           124.36 cm/s AV Vmean:          88.080 cm/s AV VTI:            0.249 m AV Peak Grad:      6.2 mmHg AV Mean Grad:      3.6 mmHg LVOT Vmax:         62.13 cm/s LVOT Vmean:        40.267 cm/s LVOT VTI:          0.113 m LVOT/AV VTI ratio: 0.45 AI PHT:            372 msec  AORTA Ao Root diam: 3.30 cm Ao Asc diam:  4.20 cm MITRAL VALVE                TRICUSPID VALVE MV Area (PHT): cm          TR Peak grad:   25.0 mmHg MV Decel Time: 196 msec     TR Vmax:        250.00 cm/s MR Peak grad: 122.3 mmHg    Estimated RAP:  3.00 mmHg MR Mean grad: 84.0 mmHg     RVSP:           28.0 mmHg MR Vmax:      553.00 cm/s MR Vmean:     429.0 cm/s    SHUNTS MV E velocity: 102.85 cm/s  Systemic VTI:  0.11 m MV A velocity: 41.77 cm/s   Systemic Diam: 2.70 cm MV E/A ratio:  2.46 Gaylyn Keas MD Electronically signed by Gaylyn Keas MD Signature Date/Time: 12/27/2023/3:46:38 PM    Final      ASSESSMENT/PLAN:  This is a very pleasant 89 year old caucasian male with IgG smoldering Loma diagnosed in April 2011. The patient has been on observation since 2011. His recent myeloma panel showed further increase in the free kappa  light chain. His recent skeletal bone survey showed no concerning findings for disease progression.  He has a focus of avascular necrosis but no significant lytic lesions. His bone marrow bone marrow biopsy and aspirate showed further increase in the plasma cells to 15%. The patient underwent treatment with subcutaneous weekly Velcade  as well as Revlimid  and Decadron  status post 7 cycles. He has been on observation since that time more than several ago.  The patient is feeling fine with no concerning complaints.  The patient recently had a repeat myeloma panel performed. The patient was seen with Dr. Marguerita Shih. Labs were reviewed. Recommend he continue on observation with  close monitoring.   We will see him back in 6 months for evaluation and repeat blood work. We will then see him about 1 week after repeat labs to review the results in the office.   Prostate Cancer Early stage, treated with radiation therapy. No current hormonal therapy. PSA levels are monitored every three months by urologist, with slight but not significant increases. -Continue current management under urologist's care. -Follow-up with urologist in May 2025   For the history of prostate cancer he is currently followed by Dr. Rozanne Corners.  The patient was advised to call immediately if he has any concerning symptoms in the interval. The patient voices understanding of current disease status and treatment options and is in agreement with the current care plan. All questions were answered. The patient knows to call the clinic with any problems, questions or concerns. We can certainly see the patient much sooner if necessary    No orders of the defined types were placed in this encounter.     Loveta Dellis L Rylynn Kobs, PA-C 01/19/24  ADDENDUM: Hematology/Oncology Attending: I had a face-to-face encounter with the patient today.  I reviewed his record, lab, and recommended his care plan.  This is a very pleasant 89 years old  white male with history of multiple myeloma that was initially diagnosed as a smoldering myeloma in February 2011 before progression to multiple myeloma treated with systemic therapy with weekly subcutaneous Velcade , Revlimid  and Decadron  for 7 cycles and the patient has been on observation for the last 5 years.  He is here today with repeat myeloma panel.  He is feeling fine with no concerning complaints except for mild fatigue. His myeloma panel showed no significant disease progression. I recommended for the patient to continue on observation with repeat myeloma panel in 6 months. He was advised to call immediately if he has any other concerning symptoms in the interval. The total time spent in the appointment was 20 minutes. Disclaimer: This note was dictated with voice recognition software. Similar sounding words can inadvertently be transcribed and may be missed upon review. Aurelio Blower, MD

## 2024-01-22 DIAGNOSIS — R55 Syncope and collapse: Secondary | ICD-10-CM | POA: Diagnosis not present

## 2024-01-23 ENCOUNTER — Ambulatory Visit: Payer: Medicare Other | Admitting: Internal Medicine

## 2024-01-23 DIAGNOSIS — R55 Syncope and collapse: Secondary | ICD-10-CM | POA: Diagnosis not present

## 2024-01-24 ENCOUNTER — Inpatient Hospital Stay: Admitting: Physician Assistant

## 2024-01-24 VITALS — BP 154/83 | HR 89 | Temp 97.8°F | Resp 16 | Wt 169.3 lb

## 2024-01-24 DIAGNOSIS — Z79899 Other long term (current) drug therapy: Secondary | ICD-10-CM | POA: Diagnosis not present

## 2024-01-24 DIAGNOSIS — N1832 Chronic kidney disease, stage 3b: Secondary | ICD-10-CM | POA: Diagnosis not present

## 2024-01-24 DIAGNOSIS — C9000 Multiple myeloma not having achieved remission: Secondary | ICD-10-CM

## 2024-01-24 DIAGNOSIS — G4733 Obstructive sleep apnea (adult) (pediatric): Secondary | ICD-10-CM | POA: Diagnosis not present

## 2024-01-24 DIAGNOSIS — Z7952 Long term (current) use of systemic steroids: Secondary | ICD-10-CM | POA: Diagnosis not present

## 2024-01-24 DIAGNOSIS — Z881 Allergy status to other antibiotic agents status: Secondary | ICD-10-CM | POA: Diagnosis not present

## 2024-01-24 DIAGNOSIS — Z9049 Acquired absence of other specified parts of digestive tract: Secondary | ICD-10-CM | POA: Diagnosis not present

## 2024-01-24 DIAGNOSIS — Z888 Allergy status to other drugs, medicaments and biological substances status: Secondary | ICD-10-CM | POA: Diagnosis not present

## 2024-01-24 DIAGNOSIS — Z923 Personal history of irradiation: Secondary | ICD-10-CM | POA: Diagnosis not present

## 2024-01-24 DIAGNOSIS — R197 Diarrhea, unspecified: Secondary | ICD-10-CM | POA: Diagnosis not present

## 2024-01-24 DIAGNOSIS — Z886 Allergy status to analgesic agent status: Secondary | ICD-10-CM | POA: Diagnosis not present

## 2024-01-24 DIAGNOSIS — Z7901 Long term (current) use of anticoagulants: Secondary | ICD-10-CM | POA: Diagnosis not present

## 2024-01-24 DIAGNOSIS — C61 Malignant neoplasm of prostate: Secondary | ICD-10-CM | POA: Diagnosis not present

## 2024-01-24 DIAGNOSIS — Z885 Allergy status to narcotic agent status: Secondary | ICD-10-CM | POA: Diagnosis not present

## 2024-01-24 DIAGNOSIS — I5032 Chronic diastolic (congestive) heart failure: Secondary | ICD-10-CM | POA: Diagnosis not present

## 2024-01-24 DIAGNOSIS — Z880 Allergy status to penicillin: Secondary | ICD-10-CM | POA: Diagnosis not present

## 2024-02-09 ENCOUNTER — Emergency Department (HOSPITAL_COMMUNITY)
Admission: EM | Admit: 2024-02-09 | Discharge: 2024-02-09 | Disposition: A | Discharge status: Home / Self Care | Attending: Emergency Medicine | Admitting: Emergency Medicine

## 2024-02-09 ENCOUNTER — Other Ambulatory Visit: Payer: Self-pay

## 2024-02-09 DIAGNOSIS — R600 Localized edema: Secondary | ICD-10-CM | POA: Diagnosis not present

## 2024-02-09 DIAGNOSIS — Z7901 Long term (current) use of anticoagulants: Secondary | ICD-10-CM | POA: Diagnosis not present

## 2024-02-09 DIAGNOSIS — Z8546 Personal history of malignant neoplasm of prostate: Secondary | ICD-10-CM | POA: Diagnosis not present

## 2024-02-09 DIAGNOSIS — M7989 Other specified soft tissue disorders: Secondary | ICD-10-CM | POA: Diagnosis not present

## 2024-02-09 DIAGNOSIS — I509 Heart failure, unspecified: Secondary | ICD-10-CM | POA: Diagnosis not present

## 2024-02-09 DIAGNOSIS — R2240 Localized swelling, mass and lump, unspecified lower limb: Secondary | ICD-10-CM | POA: Insufficient documentation

## 2024-02-09 MED ORDER — CLINDAMYCIN HCL 150 MG PO CAPS
450.0000 mg | ORAL_CAPSULE | Freq: Three times a day (TID) | ORAL | 0 refills | Status: AC
Start: 2024-02-09 — End: 2024-02-16

## 2024-02-09 MED ORDER — CLINDAMYCIN HCL 150 MG PO CAPS: 450.0000 mg | ORAL_CAPSULE | Freq: Three times a day (TID) | ORAL | 0 refills | Status: DC

## 2024-02-09 NOTE — ED Triage Notes (Signed)
 Pt sent by PCP to r/o DVT. Pt has pain and swelling in right lower leg.

## 2024-02-09 NOTE — ED Provider Notes (Signed)
 [place] EMERGENCY DEPARTMENT AT [facility] Provider Note   CSN: [id number] Arrival date & time: 02/09/24  1758     History  Chief Complaint  Patient presents with   Leg Swelling    Thomas [name] is a 89 y.o. male with a history of prostate cancer, multiple myeloma, CHF, and proximal atrial fibrillation presents the ED today for leg swelling.  Patient was at [facility] earlier today and they advised him to come to the ED for a DVT study due to having swelling, pain, and redness of the right lower extremity.  Patient states that his leg has been like this for the past week or so.  Is able to ambulate on his leg.  No fevers.  He is on Eliquis  daily for atrial fibrillation, denies skipping any recent doses.  Had an aortic valve replacement at the end of February otherwise no hospitalizations, surgeries, or long travels.    Home Medications Prior to Admission medications   Medication Sig Start Date End Date Taking? Authorizing Provider  acetaminophen  (TYLENOL ) 500 MG tablet Take 1,000 mg by mouth 2 (two) times daily as needed for mild pain (pain score 1-3) or moderate pain (pain score 4-6).    [provider]  apixaban  (ELIQUIS ) 2.5 MG TABS tablet Take 1 tablet (2.5 mg total) by mouth 2 (two) times daily. 09/04/23   Arnoldo Lapping, MD  azithromycin  (ZITHROMAX ) 500 MG tablet Take 1 tablet (500 mg total) by mouth as directed. Take one tablet 1 hour before any dental work including cleanings. 12/06/23   Ardia Kraft, PA-C  cholecalciferol (VITAMIN D3) 25 MCG (1000 UNIT) tablet Take 1,000 Units by mouth daily.    [provider]  clindamycin (CLEOCIN) 150 MG capsule Take 3 capsules (450 mg total) by mouth 3 (three) times daily for 7 days. 02/09/24 02/16/24  Sonnie Dusky, PA-C  dorzolamide -timolol  (COSOPT ) 22.3-6.8 MG/ML ophthalmic solution Place 1 drop into both eyes 2 (two) times daily. 11/03/21   [provider]  latanoprost  (XALATAN ) 0.005 %  ophthalmic solution INSTILL 1 DROP INTO BOTH EYES EVERY DAY AT NIGHT 01/03/18   [provider]      Allergies    Benadryl [diphenhydramine hcl], Ceftriaxone sodium, Nsaids, Penicillins, Farxiga  [dapagliflozin ], Codeine, Doxycycline, Ibuprofen, Levaquin [levofloxacin], Oxybutynin, Tramadol hcl, Chlorhexidine, and Furosemide     Review of Systems   Review of Systems  Cardiovascular:  Positive for leg swelling.  All other systems reviewed and are negative.   Physical Exam Updated Vital Signs BP (!) 160/78   Pulse (!) 107   Temp 97.9 F (36.6 C)   Resp 18   SpO2 100%  Physical Exam Vitals and nursing note reviewed.  Constitutional:      General: He is not in acute distress.    Appearance: Normal appearance.  HENT:     Head: Normocephalic and atraumatic.     Mouth/Throat:     Mouth: Mucous membranes are moist.  Eyes:     Conjunctiva/sclera: Conjunctivae normal.     Pupils: Pupils are equal, round, and reactive to light.  Cardiovascular:     Rate and Rhythm: Normal rate and regular rhythm.     Pulses: Normal pulses.  Pulmonary:     Effort: Pulmonary effort is normal.     Breath sounds: Normal breath sounds.  Abdominal:     Palpations: Abdomen is soft.     Tenderness: There is no abdominal tenderness.  Musculoskeletal:        General: Normal range  of motion.     Left lower leg: Edema present.     Comments: Palpable dorsalis pedis pulses bilaterally  Skin:    General: Skin is warm and dry.     Findings: No rash.  Neurological:     General: No focal deficit present.     Mental Status: He is alert.     Sensory: No sensory deficit.     Motor: No weakness.  Psychiatric:        Mood and Affect: Mood normal.        Behavior: Behavior normal.    ED Results / Procedures / Treatments   Labs (all labs ordered are listed, but only abnormal results are displayed) Labs Reviewed - No data to display  EKG None  Radiology No results found.  Procedures Procedures     Medications Ordered in ED Medications - No data to display  ED Course/ Medical Decision Making/ A&P                                 Medical Decision Making  This patient presents to the ED for concern of leg swelling, this involves an extensive number of treatment options, and is a complaint that carries with it a high risk of complications and morbidity.   Differential diagnosis includes: Cellulitis, DVT, venous stasis, etc.   Comorbidities  See HPI above   Additional History  Additional history obtained from prior records   Problem List / ED Course / Critical Interventions / Medication Management  Patient reports redness, swelling, and warmth to touch of the right lower extremity for the past week or so.  Was seen at [facility] today was advised to come to the ED to receive a DVT study. Unfortunately, ultrasound is unavailable at this time.  Information given for patient to come back tomorrow to get the DVT study.  Will send prescription of clindamycin to the pharmacy for patient to take to treat for cellulitis if the DVT study comes back negative.  Patient and daughter at bedside agreeable with plan. I have reviewed the patients home medicines and have made adjustments as needed   Social Determinants of Health  Physical activity   Test / Admission - Considered  Patient is agreeable with plan for safe discharge home. Return precautions given.       Final Clinical Impression(s) / ED Diagnoses Final diagnoses:  Leg swelling    Rx / DC Orders ED Discharge Orders          Ordered    LE Venous       Comments: IMPORTANT PATIENT INSTRUCTIONS: You have been scheduled for an Outpatient Vascular Study at [facility].  If tomorrow is a Saturday, Sunday or holiday, please go to the [facility] Registration Desk at 11 am tomorrow morning and tell them you are there for a vascular study.  If tomorrow is a weekday (Monday-Friday),  please go to the [facility] (address [address], [place]) at 8 am and report to the 4th floor registration Zone A.  Inform registration that you are there for a vascular study.   02/09/24 1940    clindamycin (CLEOCIN) 150 MG capsule  3 times daily,   Status:  Discontinued        05 /09/25 1940    clindamycin (CLEOCIN) 150 MG capsule  3 times daily  02/09/24 1940              Sonnie Dusky, PA-C 02/09/24 1946    Nicklas Barns, MD 02/10/24 1705

## 2024-02-09 NOTE — Discharge Instructions (Addendum)
 As discussed, please follow the instructions on the vascular orders to get the DVT study tomorrow morning.  If the study comes back negative, start the clindamycin 450 mg 3 times a day to treat for a skin infection.  Otherwise, follow-up with your primary care provider in the next week for reevaluation.

## 2024-02-10 ENCOUNTER — Emergency Department (EMERGENCY_DEPARTMENT_HOSPITAL)
Admission: RE | Admit: 2024-02-10 | Discharge: 2024-02-10 | Disposition: A | Discharge status: Home / Self Care | Source: Ambulatory Visit | Attending: Emergency Medicine | Admitting: Emergency Medicine

## 2024-02-10 ENCOUNTER — Emergency Department (HOSPITAL_COMMUNITY)
Admission: EM | Admit: 2024-02-10 | Discharge: 2024-02-10 | Disposition: A | Discharge status: Home / Self Care | Attending: Emergency Medicine | Admitting: Emergency Medicine

## 2024-02-10 ENCOUNTER — Other Ambulatory Visit: Payer: Self-pay

## 2024-02-10 ENCOUNTER — Encounter (HOSPITAL_COMMUNITY): Payer: Self-pay

## 2024-02-10 DIAGNOSIS — I509 Heart failure, unspecified: Secondary | ICD-10-CM | POA: Insufficient documentation

## 2024-02-10 DIAGNOSIS — Z8546 Personal history of malignant neoplasm of prostate: Secondary | ICD-10-CM | POA: Insufficient documentation

## 2024-02-10 DIAGNOSIS — M79661 Pain in right lower leg: Secondary | ICD-10-CM | POA: Diagnosis present

## 2024-02-10 DIAGNOSIS — I824Y1 Acute embolism and thrombosis of unspecified deep veins of right proximal lower extremity: Secondary | ICD-10-CM | POA: Diagnosis not present

## 2024-02-10 DIAGNOSIS — M7989 Other specified soft tissue disorders: Secondary | ICD-10-CM

## 2024-02-10 DIAGNOSIS — Z7901 Long term (current) use of anticoagulants: Secondary | ICD-10-CM | POA: Diagnosis not present

## 2024-02-10 LAB — CBC WITH DIFFERENTIAL/PLATELET
Abs Immature Granulocytes: 0.07 10*3/uL (ref 0.00–0.07)
Basophils Absolute: 0 10*3/uL (ref 0.0–0.1)
Basophils Relative: 0 %
Eosinophils Absolute: 0 10*3/uL (ref 0.0–0.5)
Eosinophils Relative: 1 %
HCT: 39.5 % (ref 39.0–52.0)
Hemoglobin: 13.2 g/dL (ref 13.0–17.0)
Immature Granulocytes: 1 %
Lymphocytes Relative: 16 %
Lymphs Abs: 1 10*3/uL (ref 0.7–4.0)
MCH: 31.9 pg (ref 26.0–34.0)
MCHC: 33.4 g/dL (ref 30.0–36.0)
MCV: 95.4 fL (ref 80.0–100.0)
Monocytes Absolute: 1.1 10*3/uL — ABNORMAL HIGH (ref 0.1–1.0)
Monocytes Relative: 18 %
Neutro Abs: 3.9 10*3/uL (ref 1.7–7.7)
Neutrophils Relative %: 64 %
Platelets: 144 10*3/uL — ABNORMAL LOW (ref 150–400)
RBC: 4.14 MIL/uL — ABNORMAL LOW (ref 4.22–5.81)
RDW: 14.2 % (ref 11.5–15.5)
WBC: 6.2 10*3/uL (ref 4.0–10.5)
nRBC: 0 % (ref 0.0–0.2)

## 2024-02-10 LAB — PROTIME-INR
INR: 1.4 — ABNORMAL HIGH (ref 0.8–1.2)
Prothrombin Time: 17.1 s — ABNORMAL HIGH (ref 11.4–15.2)

## 2024-02-10 LAB — COMPREHENSIVE METABOLIC PANEL WITH GFR
ALT: 15 U/L (ref 0–44)
AST: 28 U/L (ref 15–41)
Albumin: 3.7 g/dL (ref 3.5–5.0)
Alkaline Phosphatase: 93 U/L (ref 38–126)
Anion gap: 7 (ref 5–15)
BUN: 22 mg/dL (ref 8–23)
CO2: 22 mmol/L (ref 22–32)
Calcium: 9.5 mg/dL (ref 8.9–10.3)
Chloride: 108 mmol/L (ref 98–111)
Creatinine, Ser: 1.51 mg/dL — ABNORMAL HIGH (ref 0.61–1.24)
GFR, Estimated: 44 mL/min — ABNORMAL LOW (ref >60)
Glucose, Bld: 100 mg/dL — ABNORMAL HIGH (ref 70–99)
Potassium: 3.9 mmol/L (ref 3.5–5.1)
Sodium: 137 mmol/L (ref 135–145)
Total Bilirubin: 1.5 mg/dL — ABNORMAL HIGH (ref 0.0–1.2)
Total Protein: 6.9 g/dL (ref 6.5–8.1)

## 2024-02-10 LAB — APTT: aPTT: 46 s — ABNORMAL HIGH (ref 24–36)

## 2024-02-10 MED ORDER — OXYCODONE HCL 5 MG PO TABS
5.0000 mg | ORAL_TABLET | Freq: Once | ORAL | Status: AC
  Administered 2024-02-10: 5 mg via ORAL
  Filled 2024-02-10: qty 1

## 2024-02-10 MED ORDER — OXYCODONE HCL 5 MG PO TABS: 5.0000 mg | ORAL_TABLET | ORAL | 0 refills | Status: DC | PRN

## 2024-02-10 MED ORDER — APIXABAN 5 MG PO TABS: ORAL_TABLET | ORAL | 0 refills | Status: DC

## 2024-02-10 MED ORDER — APIXABAN 5 MG PO TABS: 5.0000 mg | ORAL_TABLET | Freq: Two times a day (BID) | ORAL | 0 refills | Status: DC

## 2024-02-10 MED ORDER — APIXABAN 5 MG PO TABS
10.0000 mg | ORAL_TABLET | Freq: Once | ORAL | Status: AC
  Administered 2024-02-10: 10 mg via ORAL
  Filled 2024-02-10: qty 2

## 2024-02-10 NOTE — ED Triage Notes (Signed)
 Pt came in via POV from vascular d/t finding 4 clots in the calf of Rt leg (per family). Pt reports he initially had pain in the Rt calf a couple of weeks ago & when he was seen at [facility] he was sent to [facility] & then today had a follow up appointment where the scan was done finding the DVT before arrival to ED today. In triage pt rates his pain 8/10 & pitting edema noted to that foot & ankle. Doppler used & pulse is present in Rt foot.

## 2024-02-10 NOTE — Progress Notes (Addendum)
 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  He had outpatient venous Dopplers showing acute DVT involving the right popliteal vein, right posterior tibial veins, and right peroneal veins and this was the reason for his presentation.  He is currently on Eliquis  2.5 mg twice daily for his paroxysmal atrial fibrillation (reduced dose due to age and kidney function).  He is otherwise hemodynamically stable with normal vital signs.  Lab work including CBC and CMP are stable and at baseline.  In regards to his acute DVT while on reduced Eliquis  dose, I did discuss with oncology, Dr. Alita Irwin, who recommended increasing Eliquis  dose to 5 mg twice daily and having patient follow-up with oncology in the outpatient setting.  Given he is hemodynamically stable and without any signs of PE, he is stable for outpatient management of his DVT.  Discussed with pharmacy, it is not necessary for patient to be loaded with Eliquis  prior to his discharge.  I have conveyed this information to the ED provider who will discharge patient with Eliquis  5 mg twice daily and arrange outpatient oncology follow-up.  Please reach out if there are any additional questions or concerns.  Mervil Wacker, MD [facility] 02/10/2024, 3:08 PM

## 2024-02-10 NOTE — Progress Notes (Signed)
 VASCULAR LAB    Right lower extremity venous duplex has been performed.  See CV proc for preliminary results.  Patient positive for DVT, taken to ED for admission for treatment  Taiven Greenley, RVT 02/10/2024, 11:59 AM

## 2024-02-10 NOTE — Discharge Instructions (Addendum)
 You were evaluated in the emergency room for a blood clot.  Your lab work did not show any significant abnormality.  Please take 10 mg of Eliquis  twice a day for 7 days then reduce dose to 5 mg twice a day until instructed otherwise by a physician.  A prescription for oxycodone  was additionally sent into your pharmacy.  This can cause drowsiness and constipation.  I would additionally recommend taking Tylenol  1000 mg every 4-6 hours up to 3 times a day.  Please follow-up with your oncologist next week.

## 2024-02-10 NOTE — ED Provider Notes (Signed)
 [place] EMERGENCY DEPARTMENT AT [facility] Provider Note   CSN: [id number] Arrival date & time: 02/10/24  1204     History  No chief complaint on file.   Thomas [name] is a 89 y.o. male history of prostate cancer, multiple myeloma, A-fib on Eliquis , CHF presents with complaints of right lower extremity pain and swelling.  Was evaluated yesterday at [facility] for similar complaint.  Unfortunately ultrasound was not available and he was referred to obtain outpatient.  Patient is here from vascular after finding DVTs in his right lower extremity.  No chest pain or shortness of breath.  HPI     Home Medications Prior to Admission medications   Medication Sig Start Date End Date Taking? Authorizing Provider  acetaminophen  (TYLENOL ) 500 MG tablet Take 1,000 mg by mouth 2 (two) times daily as needed for mild pain (pain score 1-3) or moderate pain (pain score 4-6).    [provider]  apixaban  (ELIQUIS ) 2.5 MG TABS tablet Take 1 tablet (2.5 mg total) by mouth 2 (two) times daily. 09/04/23   Arnoldo Lapping, MD  azithromycin  (ZITHROMAX ) 500 MG tablet Take 1 tablet (500 mg total) by mouth as directed. Take one tablet 1 hour before any dental work including cleanings. 12/06/23   Ardia Kraft, PA-C  cholecalciferol (VITAMIN D3) 25 MCG (1000 UNIT) tablet Take 1,000 Units by mouth daily.    [provider]  clindamycin (CLEOCIN) 150 MG capsule Take 3 capsules (450 mg total) by mouth 3 (three) times daily for 7 days. 02/09/24 02/16/24  Sonnie Dusky, PA-C  dorzolamide -timolol  (COSOPT ) 22.3-6.8 MG/ML ophthalmic solution Place 1 drop into both eyes 2 (two) times daily. 11/03/21   [provider]  latanoprost  (XALATAN ) 0.005 % ophthalmic solution INSTILL 1 DROP INTO BOTH EYES EVERY DAY AT NIGHT 01/03/18   [provider]      Allergies    Benadryl [diphenhydramine hcl], Ceftriaxone sodium, Nsaids, Penicillins, Farxiga  [dapagliflozin ], Codeine,  Doxycycline, Ibuprofen, Levaquin [levofloxacin], Oxybutynin, Tramadol hcl, Chlorhexidine, and Furosemide     Review of Systems   Review of Systems  Musculoskeletal:  Positive for myalgias.    Physical Exam Updated Vital Signs BP 118/72   Pulse 85   Temp 97.8 F (36.6 C) (Oral)   Resp 17   Ht 5\' 4"  (1.626 m)   Wt 74.8 kg   SpO2 98%   BMI 28.32 kg/m  Physical Exam Vitals and nursing note reviewed.  Constitutional:      General: He is not in acute distress.    Appearance: He is well-developed.  HENT:     Head: Normocephalic and atraumatic.  Eyes:     Conjunctiva/sclera: Conjunctivae normal.  Cardiovascular:     Rate and Rhythm: Normal rate and regular rhythm.     Heart sounds: No murmur heard. Pulmonary:     Effort: Pulmonary effort is normal. No respiratory distress.     Breath sounds: Normal breath sounds.  Abdominal:     Palpations: Abdomen is soft.     Tenderness: There is no abdominal tenderness.  Musculoskeletal:        General: Swelling present.     Cervical back: Neck supple.     Comments: Unilateral swelling in right lower extremity, with tenderness to the calf, positive Homans, there is some increased warmth without erythema, DP pulses are faint but detectable on Doppler, tolerates full range of motion of ankle with some discomfort  Skin:    General: Skin is warm and dry.  Capillary Refill: Capillary refill takes less than 2 seconds.  Neurological:     Mental Status: He is alert.  Psychiatric:        Mood and Affect: Mood normal.     ED Results / Procedures / Treatments   Labs (all labs ordered are listed, but only abnormal results are displayed) Labs Reviewed  CBC WITH DIFFERENTIAL/PLATELET - Abnormal; Notable for the following components:      Result Value   RBC 4.14 (*)    Platelets 144 (*)    Monocytes Absolute 1.1 (*)    All other components within normal limits  COMPREHENSIVE METABOLIC PANEL WITH GFR - Abnormal; Notable for the following  components:   Glucose, Bld 100 (*)    Creatinine, Ser 1.51 (*)    Total Bilirubin 1.5 (*)    GFR, Estimated 44 (*)    All other components within normal limits  PROTIME-INR - Abnormal; Notable for the following components:   Prothrombin Time 17.1 (*)    INR 1.4 (*)    All other components within normal limits  APTT - Abnormal; Notable for the following components:   aPTT 46 (*)    All other components within normal limits    EKG None  Radiology LE Venous Result Date: 02/10/2024  Lower Venous DVT Study Patient Name:  Thomas [name]  Date of Exam:   02/10/2024 Medical Rec #: [id number]          Accession #:    [id number] Date of Birth: 04-02-1935          Patient Gender: M Patient Age:   89 years Exam Location:  [facility] Procedure:      VAS US  LOWER EXTREMITY VENOUS (DVT) Referring Phys: Sonnie Dusky --------------------------------------------------------------------------------  Indications: Pain, and Swelling. Patient had gel shots in knees 12/2023, calf pain and calf and foot swelling has been increasing over last several days.  Risk Factors: Multiple myeloma 2011, prostate adenocarcinoma 04/2011. Atrial fibrillation. TAVR 11/2023 Anticoagulation: Eliquis . Comparison Study: Prior negative Right LEV done 03/13/18 Performing Technologist: Carleene Chase RVS  Examination Guidelines: A complete evaluation includes B-mode imaging, spectral Doppler, color Doppler, and power Doppler as needed of all accessible portions of each vessel. Bilateral testing is considered an integral part of a complete examination. Limited examinations for reoccurring indications may be performed as noted. The reflux portion of the exam is performed with the patient in reverse Trendelenburg.  +---------+---------------+---------+-----------+----------+------------------+ RIGHT    CompressibilityPhasicitySpontaneityPropertiesThrombus Aging      +---------+---------------+---------+-----------+----------+------------------+ CFV      Full           Yes      Yes                                     +---------+---------------+---------+-----------+----------+------------------+ SFJ      Full                                                            +---------+---------------+---------+-----------+----------+------------------+ FV Prox  Full           Yes      Yes                                     +---------+---------------+---------+-----------+----------+------------------+  FV Mid   Partial                                      patent by color    +---------+---------------+---------+-----------+----------+------------------+ FV DistalFull                                         patent by color    +---------+---------------+---------+-----------+----------+------------------+ PFV      Full           Yes      Yes                                     +---------+---------------+---------+-----------+----------+------------------+ POP      Partial        No       No                   Acute distal                                                             portion            +---------+---------------+---------+-----------+----------+------------------+ PTV      None                                         Acute              +---------+---------------+---------+-----------+----------+------------------+ PERO     None                                         Acute              +---------+---------------+---------+-----------+----------+------------------+ Gastroc  Full                                                            +---------+---------------+---------+-----------+----------+------------------+   +----+---------------+---------+-----------+----------+--------------+ LEFTCompressibilityPhasicitySpontaneityPropertiesThrombus Aging  +----+---------------+---------+-----------+----------+--------------+ CFV Full           Yes      Yes                                 +----+---------------+---------+-----------+----------+--------------+     Summary: RIGHT: - Findings consistent with acute deep vein thrombosis involving the right popliteal vein, right posterior tibial veins, and right peroneal veins.   LEFT: - No evidence of common femoral vein obstruction.   *See table(s) above for measurements and observations.    Preliminary     Procedures Procedures    Medications Ordered in ED Medications  oxyCODONE  (Oxy IR/ROXICODONE ) immediate release tablet 5 mg (has no administration in time range)  ED Course/ Medical Decision Making/ A&P Clinical Course as of 02/10/24 1504  Sat Feb 10, 2024  1322 RIGHT:  - Findings consistent with acute deep vein thrombosis involving the right  popliteal vein, right posterior tibial veins, and right peroneal veins.      [SG]    Clinical Course User Index [SG] Teddi Favors, DO                                 Medical Decision Making Amount and/or Complexity of Data Reviewed Labs: ordered.  Risk Prescription drug management.   This patient presents to the ED with chief complaint(s) of DVT.  The complaint involves an extensive differential diagnosis and also carries with it a high risk of complications and morbidity.   Pertinent past medical history as listed in HPI  The differential diagnosis includes  DVT, cellulitis, septic joint, gout Additional history obtained: Additional history obtained from family Records reviewed Care Everywhere/External Records  Assessment and management:   Hemodynamically stable, nontoxic-appearing patient presenting with complaints of right lower extremity pain, swelling x 2 weeks.  Patient found to have DVTs involving the right popliteal vein, right posterior tibial veins and the right peroneal veins on his outpatient duplex today.  He  denies any chest pain or shortness of breath, he is not tachycardic or tachypneic.  On exam he does have unilateral right lower extremity swelling and tenderness.  DP pulses are faint but detectable on Doppler.  He does tolerate full range of motion of the ankle with some discomfort.  There is some increased warmth, however I do not suspect septic joint or gout.  Is not erythematous.  No suspicion for cellulitis.  Patient is on Eliquis  for A-fib and reports compliance.  He does have known prostate cancer and multiple myeloma and additionally underwent a TAVR back in February of this year.  Patient will be discharged home on Eliquis  5 mg twice daily and will follow-up with his oncologist next week.  Short supply of oxycodone  supplied.   Independent ECG interpretation:  none  Independent labs interpretation:  The following labs were independently interpreted:  CBC without significant abnormality, PT 17.1, INR 1.4, APTT 46, elevated creatinine to 1.51, appears to be near baseline  Independent visualization and interpretation of imaging: I independently visualized the following imaging with scope of interpretation limited to determining acute life threatening conditions related to emergency care:  DVT Outpatient :  Findings consistent with acute deep vein thrombosis involving the right  popliteal vein, right posterior tibial veins, and right peroneal veins.       Consultations obtained:   Hospitalist Dr. Consuela Denier reached out to oncology who recommended increasing Eliquis  5 BID and following up with oncology outpatient.    Disposition:   Patient will be discharged home. The patient has been appropriately medically screened and/or stabilized in the ED. I have low suspicion for any other emergent medical condition which would require further screening, evaluation or treatment in the ED or require inpatient management. At time of discharge the patient is hemodynamically stable and in no acute distress.  I have discussed work-up results and diagnosis with patient and answered all questions. Patient is agreeable with discharge plan. We discussed strict return precautions for returning to the emergency department and they verbalized understanding.     Social Determinants of Health:   none  This note was dictated with voice recognition software.  Despite best efforts at  proofreading, errors may have occurred which can change the documentation meaning.          Final Clinical Impression(s) / ED Diagnoses Final diagnoses:  Acute deep vein thrombosis (DVT) of proximal vein of right lower extremity [facility])    Rx / DC Orders ED Discharge Orders     None         Felicie Horning, PA-C 02/10/24 1517    Teddi Favors, DO 02/11/24 [phone]

## 2024-02-12 ENCOUNTER — Telehealth: Payer: Self-pay | Admitting: Medical Oncology

## 2024-02-12 NOTE — Telephone Encounter (Signed)
 Right lower extremity doppler  positive for acute DVT involving the right popliteal vein, right posterior tibial veins, and right peroneal veins. Eloquis increased to 10 mg bid.  Dtr asking when to f/u with Dr. Marguerita Shih?  Next f/u expected in August.

## 2024-02-13 ENCOUNTER — Encounter: Payer: Self-pay | Admitting: Medical Oncology

## 2024-02-14 ENCOUNTER — Telehealth: Payer: Self-pay | Admitting: Medical Oncology

## 2024-02-14 ENCOUNTER — Encounter: Payer: Self-pay | Admitting: Medical Oncology

## 2024-02-14 NOTE — Telephone Encounter (Signed)
 Next appt in October.  Does he need to see Dr Marguerita Shih before then re DVTs?

## 2024-02-20 ENCOUNTER — Telehealth: Payer: Self-pay | Admitting: *Deleted

## 2024-02-20 ENCOUNTER — Encounter: Payer: Self-pay | Admitting: Cardiovascular Disease

## 2024-02-20 ENCOUNTER — Ambulatory Visit: Attending: Emergency Medicine | Admitting: Cardiovascular Disease

## 2024-02-20 DIAGNOSIS — N1832 Chronic kidney disease, stage 3b: Secondary | ICD-10-CM | POA: Diagnosis not present

## 2024-02-20 DIAGNOSIS — Z952 Presence of prosthetic heart valve: Secondary | ICD-10-CM

## 2024-02-20 DIAGNOSIS — Z7901 Long term (current) use of anticoagulants: Secondary | ICD-10-CM | POA: Diagnosis not present

## 2024-02-20 DIAGNOSIS — C61 Malignant neoplasm of prostate: Secondary | ICD-10-CM

## 2024-02-20 DIAGNOSIS — G4733 Obstructive sleep apnea (adult) (pediatric): Secondary | ICD-10-CM

## 2024-02-20 DIAGNOSIS — I10 Essential (primary) hypertension: Secondary | ICD-10-CM

## 2024-02-20 DIAGNOSIS — C9000 Multiple myeloma not having achieved remission: Secondary | ICD-10-CM | POA: Diagnosis not present

## 2024-02-20 DIAGNOSIS — I5032 Chronic diastolic (congestive) heart failure: Secondary | ICD-10-CM

## 2024-02-20 DIAGNOSIS — I4819 Other persistent atrial fibrillation: Secondary | ICD-10-CM | POA: Diagnosis not present

## 2024-02-20 MED ORDER — METOPROLOL SUCCINATE ER 25 MG PO TB24: 12.5000 mg | ORAL_TABLET | Freq: Every day | ORAL | 3 refills | Status: DC

## 2024-02-20 NOTE — Patient Instructions (Signed)
 Medication Instructions:  Start Metoprolol  Succinate 12.5 mg daily  *If you need a refill on your cardiac medications before your next appointment, please call your pharmacy*  Lab Work: NONE ordered at this time of appointment    Testing/Procedures: NONE ordered at this time of appointment    Follow-Up: At [facility], you and your health needs are our priority.  As part of our continuing mission to provide you with exceptional heart care, our providers are all part of one team.  This team includes your primary Cardiologist (physician) and Advanced Practice Providers or APPs (Physician Assistants and Nurse Practitioners) who all work together to provide you with the care you need, when you need it.  Your next appointment:   Follow up Dr. Gaylyn Keas (As needed) Keep apt with Dr. Arlester Ladd   Provider:   Arnoldo Lapping, MD    We recommend signing up for the patient portal called "MyChart".  Sign up information is provided on this After Visit Summary.  MyChart is used to connect with patients for Virtual Visits (Telemedicine).  Patients are able to view lab/test results, encounter notes, upcoming appointments, etc.  Non-urgent messages can be sent to your provider as well.   To learn more about what you can do with MyChart, go to [email or web address].

## 2024-02-20 NOTE — Telephone Encounter (Signed)
 Per Dr Loetta Ringer order a N30i mask for patient order placed to [facility].

## 2024-02-20 NOTE — Progress Notes (Signed)
 Patient ID: Thomas [name], male   DOB: 1935-08-19, 89 y.o.   MRN: [id number]       HPI: Thomas [name], is a 89 y.o. male who was followed by Dr. Felipe Horton for cardiology care.  I initially saw him  in September 2015 for sleep clinic evaluation following initiation of CPAP therapy.  I last saw him on December 26, 2022 after receiving a new CPAP machine.  Thomas [name] has a history of paroxysmal atrial fibrillation and has been maintaining sinus rhythm with sinus bradycardia.  He has a history of hypertension, hyperlipidemia, documented bilateral renal cysts, and mild aortic valve stenosis, and multiple myeloma.  Due to concerns for obstructive sleep apnea, particularly with his atrial fibrillation, history he was referred by Dr. Felipe Horton for a diagnostic polysomnogram in 2015.   His diagnostic polysomnogram was done at the [facility] on 02/05/2014 revealed severe obstructive sleep apnea with an AHI of 45.7 per hour.  He had oxygen desaturation to 86% and there was evidence for moderate snoring.  There were frequent periodic limb movements with an index of 36 per hour with 20.3 per hour to arousal.  He subsequently was referred for a CPAP titration in a CPAP pressure of 9 cm was recommended.  Of note, there was significant improvement in his periodic leg movements with CPAP therapy.  A download from 05/24/2014 to 06/22/2014 showed excellent compliance with 100% days of usage.  He had 97% days with use greater than 4 hours and was meeting Medicare compliance standards.  He has an AirSense 10 AutoSet unit and is at a set pressure of 9 cm.  His AHI is now excellent at 1.2.  There is no leak.  He has a Lawyer, fullface mask (medium).  When I saw him in February 2018 he was 100% compliant with CPAP.  A Download  from 10/23/2016 through 11/21/2016 confirmed 100% compliance.  CPAP was set at 9 cm set pressure; AHI was 1.0.  He was using a full facemask.  His nocturia had significantly  improved with CPAP treatment. His Epworth Sleepiness Scale score is improved and was 6 compared to previously being elevated  Epworth Sleepiness Scale: Situation   Chance of Dozing/Sleeping (0 = never , 1 = slight chance , 2 = moderate chance , 3 = high chance )   sitting and reading 2   watching TV 1   sitting inactive in a public place 0   being a passenger in a motor vehicle for an hour or more 0   lying down in the afternoon 3   sitting and talking to someone 0   sitting quietly after lunch (no alcohol) 0   while stopped for a few minutes in traffic as the driver 0   Total Score  6   He continues to be followed by Dr. Felipe Horton for his paroxysmal atrial fibrillation, chronic diastolic heart failure, hypertension, chronic kidney disease and mild aortic stenosis.  Remotely he was not on anticoagulation secondary to  recurrent GU bleeding, but currently is on low-dose Eliquis .  He has undergone chemotherapy for multiple myeloma as well as treatment for prostate CA.  When I saw him in October 2019 he had  lost 49 pounds with his recent illnesses.  His primary physician question whether or not he still needed to use CPAP therapy.  In the office today I calculated a new Epworth Sleepiness Scale score and this endorsed at 11 and was consistent with mild daytime  sleepiness.  He has a ResMed air sense 10 AutoSet unit and has been set at a 9 cm pressure.  He was using an old air fit F10 full facemask and has had difficulty with pressure on the bridge of his nose.  A download from June 25, 2018 through July 24, 2018 shows 100% compliance;  averaging 6 hours and 26 minutes of CPAP use.  AHI is 3.1.  There was no leak.  During that evaluation I recommended he switch to the DreamWear technology full facemask in light of his difficulty with the older facemask on the bridge of his nose.  We discussed the importance of increasing sleep duration since his CPAP use was just under 6 hours and 30 minutes.  I  saw him in November 2020 and he continued to be in  permanent atrial fibrillation and was on diltiazem  CD 120 mg and Eliquis  2.5 mg twice a day.  He last saw Dr. Felipe Horton in February 2020.  A download was obtained today from July 13, 2019 through August 11, 2019.  Usage days 87%.  He has not been able to sleep for long duration since his wife had fallen and had broken 4 vertebrae and has had significant difficulty resulting from this.  As result he has been caring for her and oftentimes gets up during the night.  On his  download average use was 5 hours per night.  At his 9 cm water pressure, AHI however continued to be excellent at 1.2.  He typically goes to bed at 11:30 and wakes up at 630.  He likes the current air fit F 30 medium size mask that he is using that has a Management consultant compared to his old full facemask.  However, at times the tubing which from the front sometimes gets in the way when he tries to turn from side to side.  During that evaluation I recommended he switch to the ResMed air fit F 30i mask where the tubing arises from the crown of the head and does not interfere with him moving side to side.  At his August 24, 2020 evaluation he remained stable.   Previously he had significant weight loss as result of his illnesses.  But over the past year has had weight gain from 154 up to 173 pounds.  He admits to continuing to feel well with CPAP therapy.  He is unaware of breakthrough snoring.  A download was obtained from October 21 through August 21, 2020.  He is meeting compliance standards.  Average use is 6 hours and 11 minutes.  At a 9 cm set pressure, AHI is 4.2.  There is no mask leak.  An Epworth Sleepiness Scale score was recalculated in the office today and this endorsed at 5 arguing against residual daytime sleepiness.   I saw him on August 31, 2021. He has continued to be followed by Dr. Felipe Horton.  He underwent an echo Doppler study in August 2022 which showed EF of 55  to 60% with mild LVH.  There was an increase in there is aortic valve gradient with a peak gradient of 38 and a mean gradient of 19.  There was mild aortic root dilatation.  From a sleep perspective, he continues to use CPAP therapy.  Due to this 5G upgrade, his machine is no longer wireless.  [facility] is his DME company.  We were able to obtain data today with a download from October 31 through August 31, 2021.  Usage is excellent at 100%.  There is no mask leak.  His pressures are set at a range of 8 to 14 cm of water and his 95th percentile pressure is 13.6 with a maximum average pressure of 13.9.  Average use however was just shy of 6 hours at 5 hours and 56 minutes.  He states he typically goes to bed between 11:15 and 11:30 and can wake up anywhere between 6 and 7:30 AM.  He has issues with low back pain and the bad right knee and walks with a cane.   He was recently seen by Dr. Felipe Horton on September 06, 2022 before his retirement.  With his progressive aortic valve stenosis documented on his last echo in October 2023 now felt to be moderately severe he will be following up cardiology with Dr. Arlester Ladd.  I saw him on October 13, 2022.  From a sleep perspective, he needs to be referred to a new DME company since [facility] is no longer providing CPAP care.  His machine set up date is March 26, 2014.  He brought his machine to the office today so that we could manually download his device since it is no longer wireless with 5G upgrade.  His compliance continues to be excellent with 100% use with average use of 6 hours and 49 minutes.  His device is set at a pressure range of 10 to 16 cm.  His 95th percentile pressure is 15.5 with maximum average pressure 15.9.  AHI is excellent at 1.9.  He believes he is sleeping well.  He is in need for new machine.  An Epworth Sleepiness Scale score was calculated in the office today and this endorsed at 8 arguing against residual daytime sleepiness.    I  last saw him on December 26, 2022.  He received a new ResMed AirSense 11 AutoSet CPAP device from October 28, 2022 with [employer] as his new DME company.  A download from January 26 to November 26, 2022 showed 100% compliance with average use at 7 hours and 29 minutes.  His CPAP is set at a pressure range of 10 to 18 cm and 95th percentile pressure is 12.1 maximum average pressure 13.9.  AHI is excellent at 1.5.  A subsequent download from February 22 through December 23, 2022 showed 100% use with average use at 7 hours and 34 minutes.  AHI was 2.2/h with 95th percentile pressure 11.8 maximum average pressure 13.3.  Clinically he is sleeping well.  He is unaware of breakthrough snoring.  He continues to be on Eliquis  2.5 mg twice a day with his history of PAF.  He is on HCTZ 12.5 mg daily.    Thomas [name] underwent an echo on September 08, 2023 which showed EF 60 to 65%, normal RV function, and severe calcification restrictive of his aortic valve.  He was felt to have severe paradoxical low-flow low gradient aortic stenosis.  He ultimately underwent successful TAVR with a 29 mm Edwards SAPIEN 3 ultra Resilia THV via the transfemoral approach on November 28, 2023 by Dr. Arlester Ladd and Honey Lusty.  He subsequently has been seen by Darilyn Edin, PA-C on December 06, 2023.  He has history of permanent atrial fibrillation and apparently Toprol  had been stopped secondary to dizziness.  He has been on anticoagulation with reduced dose Eliquis  at 2.5 mg twice a day.  He has history of metastatic prostate CA and indolent multiple myeloma.  Presently, he is on CPAP therapy for his obstructive sleep  apnea.  [employer] is his DME company.  A download was obtained from April 20 through Feb 19, 2024.  Usage is 100%.  Average use is 5 hours 47 minutes.  His ResMed air sense 11 AutoSet unit is set at a pressure range of 10 to 18 cm of water.  95th percentile pressure is 12.3 with maximum average pressure 13.4.  He does have almost daily mask leak.   AHI remains slightly elevated at 6.1 contributed by this mask leak.  Remotely, a download from December 23 to January 2024 had shown an AHI of 1.9 at the similar pressure range without mask leak.  Aware of breakthrough snoring.  He presents for evaluation.   Past Medical History:  Diagnosis Date   BPH (benign prostatic hypertrophy)    Chronic diastolic heart failure (HCC) 12/27/2013   Chronic kidney disease    Hx of bilateral renal cysts.   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 Arlester Ladd and Dr. Honey Lusty   Severe aortic stenosis    Sinus node dysfunction (HCC) 08/14/2014   Sleep apnea    Smoldering multiple myeloma     Past Surgical History:  Procedure Laterality Date   ADRENALECTOMY  02/12/11   Left, for Pheochromocytoma   CARDIOVERSION N/A 12/26/2013   Procedure: CARDIOVERSION;  Surgeon: Elmyra Haggard, MD;  Location: [facility] ENDOSCOPY;  Service: Cardiovascular;  Laterality: N/A;   CHOLECYSTECTOMY  02/12/11   GASTRECTOMY  1984   S/P gastrectomy for ulcer   INTRAOPERATIVE TRANSTHORACIC ECHOCARDIOGRAM N/A 11/28/2023   Procedure: INTRAOPERATIVE TRANSTHORACIC ECHOCARDIOGRAM;  Surgeon: Arnoldo Lapping, MD;  Location: [facility] INVASIVE CV LAB;  Service: Cardiovascular;  Laterality: N/A;   KNEE SURGERY  1971   S/P Right knee surgery   RIGHT/LEFT HEART CATH AND CORONARY ANGIOGRAPHY N/A 11/01/2023   Procedure: RIGHT/LEFT HEART CATH AND CORONARY  ANGIOGRAPHY;  Surgeon: Arnoldo Lapping, MD;  Location: [facility] INVASIVE CV LAB;  Service: Cardiovascular;  Laterality: N/A;   TEE WITHOUT CARDIOVERSION N/A 12/26/2013   Procedure: TRANSESOPHAGEAL ECHOCARDIOGRAM (TEE);  Surgeon: Elmyra Haggard, MD;  Location: [facility] ENDOSCOPY;  Service: Cardiovascular;  Laterality: N/A;   TRANSURETHRAL RESECTION OF PROSTATE  7/12    Allergies  Allergen Reactions   Benadryl [Diphenhydramine Hcl] Other (See Comments)    Bad dreams -  Hallucinations.   Ceftriaxone Sodium Hives   Nsaids Other (See Comments)    REACTION: BLEEDING ULCER   Penicillins Hives   Farxiga  [Dapagliflozin ] Rash   Codeine     loopy   Doxycycline Nausea And Vomiting    Stomach pain   Ibuprofen Hives    Was told to stay off it by Dr. Felipe Horton.   Levaquin [Levofloxacin]     Unknown reaction    Oxybutynin Other (See Comments)    Lightheaded, dizzy, unbalanced. "Disturbed my equilibrium."   Tramadol Hcl     Other reaction(s): dizzy/nauseated   Chlorhexidine  Rash    Use alcohol for skin prep per patient request.    Furosemide  Diarrhea, Itching and Rash    Current Outpatient Medications  Medication Sig Dispense Refill   acetaminophen  (TYLENOL ) 500 MG tablet Take 1,000 mg by mouth 2 (two) times daily as needed for mild pain (pain score 1-3) or moderate pain (pain score 4-6).     apixaban  (ELIQUIS ) 5 MG TABS tablet Take 2 tablets (10mg ) twice daily for 7 days, then 1 tablet (5mg ) twice daily (Patient taking differently: Take 5 mg by mouth daily.) 60 tablet 0   azithromycin  (ZITHROMAX ) 500 MG tablet Take 1 tablet (500 mg total) by mouth as directed. Take one tablet 1 hour before any dental work including cleanings. 6 tablet 12   cholecalciferol (VITAMIN D3) 25 MCG (1000 UNIT) tablet Take 1,000 Units by mouth daily.     dorzolamide -timolol  (COSOPT ) 22.3-6.8 MG/ML ophthalmic solution Place 1 drop into both eyes 2 (two) times daily.     latanoprost  (XALATAN ) 0.005 % ophthalmic solution INSTILL 1 DROP  INTO BOTH EYES EVERY DAY AT NIGHT  6   metoprolol  succinate (TOPROL  XL) 25 MG 24 hr tablet Take 0.5 tablets (12.5 mg total) by mouth daily. 45 tablet 3   oxyCODONE  (ROXICODONE ) 5 MG immediate release tablet Take 1 tablet (5 mg total) by mouth every 4 (four) hours as needed for severe pain (pain score 7-10). 10 tablet 0   No current facility-administered medications for this visit.    Social History   Socioeconomic History   Marital status: Married    Spouse name: Not on file   Number of children: 3   Years of education: Not on file   Highest education level: Not on file  Occupational History    Employer: RETIRED  Tobacco Use   Smoking status: Never   Smokeless tobacco: Never  Vaping Use   Vaping status: Never Used  Substance and Sexual Activity   Alcohol use: No   Drug use: No   Sexual activity: Not Currently  Other Topics Concern   Not on file  Social History Narrative   Not on file   Social Drivers of Health   Financial Resource Strain: Not on file  Food Insecurity: No Food Insecurity (11/29/2023)   Hunger Vital Sign    Worried About Running Out of Food in the Last Year: Never true    Ran Out of Food in the Last Year: Never true  Transportation Needs: No Transportation Needs (11/29/2023)   PRAPARE - Administrator, Civil Service (Medical): No    Lack of Transportation (Non-Medical): No  Physical Activity: Not on file  Stress: Not on file  Social Connections: Moderately Isolated (11/29/2023)   Social Connection and Isolation Panel [NHANES]    Frequency of Communication with Friends and Family: Never    Frequency of Social Gatherings with Friends and Family: Never    Attends Religious Services: 1 to 4 times per year    Active Member of [employer] or Organizations: No    Attends Banker Meetings: Never    Marital Status: Married  Catering manager Violence: Not At Risk (11/29/2023)   Humiliation, Afraid, Rape, and Kick questionnaire    Fear of  Current or Ex-Partner: No    Emotionally Abused: No    Physically Abused: No    Sexually Abused: No     ROS General: Negative; No fevers, chills, or night sweats HEENT: Negative; No changes in vision or hearing, sinus congestion, difficulty  swallowing Pulmonary: Negative; No cough, wheezing, shortness of breath, hemoptysis Cardiovascular: Positive for permanent atrial fibrillation; status post TAVR GI: Negative; No nausea, vomiting, diarrhea, or abdominal pain GU: Negative; No dysuria, hematuria, or difficulty voiding Musculoskeletal: Back pain, walks with a cane Hematologic: Positive for multiple myeloma; and prostate CA Endocrine: Negative; no heat/cold intolerance Neuro: Negative; no changes in balance, headaches Skin: Negative; No rashes or skin lesions Psychiatric: Negative; No behavioral problems, depression Sleep: Positive for OSA on CPAP; No daytime sleepiness, hypersomnolence, bruxism, restless legs, hypnogognic hallucinations, no cataplexy   Physical Exam BP 118/72   Pulse (!) 102   Ht 5\' 4"  (1.626 m)   Wt 160 lb 3.2 oz (72.7 kg)   SpO2 96%   BMI 27.50 kg/m    Repeat blood pressure by me 136/78  Wt Readings from Last 3 Encounters:  02/20/24 160 lb 3.2 oz (72.7 kg)  02/10/24 165 lb (74.8 kg)  01/24/24 169 lb 4.8 oz (76.8 kg)    General: Alert, oriented, no distress.  Skin: normal turgor, no rashes, warm and dry HEENT: Normocephalic, atraumatic. Pupils equal round and reactive to light; sclera anicteric; extraocular muscles intact;  Nose without nasal septal hypertrophy Mouth/Parynx benign; Mallinpatti scale 3 Neck: No JVD, no carotid bruits; normal carotid upstroke Lungs: clear to ausculatation and percussion; no wheezing or rales Chest wall: without tenderness to palpitation Heart: PMI not displaced, irregular irregular consistent with A-fib rate increased at 102, s1 s2 normal, 2/6 systolic murmur in the aortic area, no diastolic murmur, no rubs, gallops,  thrills, or heaves Abdomen: soft, nontender; no hepatosplenomehaly, BS+; abdominal aorta nontender and not dilated by palpation. Back: no CVA tenderness Pulses 2+ Musculoskeletal: full range of motion, normal strength, no joint deformities Extremities: no clubbing cyanosis or edema, Homan's sign negative  Neurologic: grossly nonfocal; Cranial nerves grossly wnl Psychologic: Normal mood and affect   EKG Interpretation Date/Time:  Tuesday Feb 20 2024 10:12:16 EDT Ventricular Rate:  102 PR Interval:    QRS Duration:  90 QT Interval:  354 QTC Calculation: 461 R Axis:   24  Text Interpretation: Atrial fibrillation When compared with ECG of 06-Dec-2023 14:13, AF rate faster Confirmed by Magnus Schuller ([id number]) on 02/20/2024 10:54:51 AM    December 26, 2022 ECG (independently read by me): Atrial fibrillation, PVC  I personally reviewed the ECG from September 06, 2022 which shows atrial fibrillation at 60 bpm.  Nonspecific ST abnormality  August 31, 2021 ECG (independently read by me):  Atrial fibrillation at 78  August 24, 2020 ECG (independently read by me): Atrial fibrillation at 63; QTc 417 msec    November 2020 ECG (independently read by me): Atrial fibrillation at 78; QTc 414 msec  October 2019 ECG (independently read by me): Atrial fibrillation at 84 bpm.  QTc interval 460 ms.  February 2018 ECG (independently read by me): Sinus bradycardia at 59 bpm with first-degree AV block (PR interval 21 6/22).  QTc interval 451 ms.  LABS:     Latest Ref Rng & Units 02/10/2024   12:29 PM 01/16/2024    1:24 PM 12/11/2023    4:10 PM  BMP  Glucose 70 - 99 mg/dL 086  578  469   BUN 8 - 23 mg/dL 22  24  27    Creatinine 0.61 - 1.24 mg/dL 6.29  5.28  4.13   BUN/Creat Ratio 10 - 24   22   Sodium 135 - 145 mmol/L 137  138  141   Potassium 3.5 - 5.1  mmol/L 3.9  4.5  4.4   Chloride 98 - 111 mmol/L 108  110  107   CO2 22 - 32 mmol/L 22  26  21    Calcium  8.9 - 10.3 mg/dL 9.5  9.9  9.7        Latest Ref Rng & Units 02/10/2024   12:29 PM 01/16/2024    1:24 PM 11/24/2023    9:00 AM  Hepatic Function  Total Protein 6.5 - 8.1 g/dL 6.9  6.7  6.7   Albumin 3.5 - 5.0 g/dL 3.7  4.0  3.7   AST 15 - 41 U/L 28  22  31    ALT 0 - 44 U/L 15  14  19    Alk Phosphatase 38 - 126 U/L 93  98  74   Total Bilirubin 0.0 - 1.2 mg/dL 1.5  0.8  0.9       Latest Ref Rng & Units 02/10/2024   12:29 PM 01/16/2024    1:24 PM 11/29/2023    3:46 AM  CBC  WBC 4.0 - 10.5 K/uL 6.2  5.4  10.2   Hemoglobin 13.0 - 17.0 g/dL 16.1  09.6  04.5   Hematocrit 39.0 - 52.0 % 39.5  38.4  37.6   Platelets 150 - 400 K/uL 144  125  115    Lab Results  Component Value Date   TSH 4.500 03/29/2017   Lab Results  Component Value Date   HGBA1C 5.4 12/25/2013   Lipid Panel     Component Value Date/Time   CHOL 164 02/25/2020 0842   TRIG 131 02/25/2020 0842   HDL 41 02/25/2020 0842   CHOLHDL 4.0 02/25/2020 0842   LDLCALC 100 (H) 02/25/2020 0842     RADIOLOGY: No results found.  IMPRESSION:  1. OSA on CPAP   2. Persistent atrial fibrillation (HCC)   3. S/P TAVR (transcatheter aortic valve replacement)   4. Chronic diastolic heart failure (HCC)   5. Primary hypertension   6. Chronic anticoagulation   7. Stage 3b chronic kidney disease (HCC)   8. Multiple myeloma not having achieved remission (HCC)   9. Malignant neoplasm of prostate [facility])    ASSESSMENT AND PLAN: Thomas [name] Is an 19 -year-old male who has a history of permanent atrial fibrillation, previous documentation of chronic diastolic heart failure, hypertension, chronic kidney disease, aortic stenosis as well as a history of multiple myeloma, prostate adenocarcinoma and pheochromocytoma status post left adrenal gland resection.  He has been followed closely by Dr. Felipe Horton for cardiology care and was transition to Dr. Arlester Ladd to continue following his moderately severe aortic valve stenosis.  He underwent successful TAVR in February 2025.  He has  longstanding persistent atrial fibrillation.  Apparently due to mild dizziness, his beta-blocker had been discontinued.  Presently, ventricular rate is increased around 102 and blood pressure is stable.  I have recommended reinitiation of very low-dose metoprolol  succinate but instead of his previous dose of 25 mg have suggested starting back at 12.5 mg daily.  He has obstructive sleep apnea and is on CPAP therapy I reviewed his most recent download.  He is compliant with usage days at 100%.  Average use is suboptimal at only 5 hours and 47 minutes per night.  He does have mask leak.  [employer] is his DME company.  He has been having difficulty with his old mask and I have suggested changing this to a ResMed AirSense N30i mask.  I also will change his pressures to a range  of 12 to 18 cm of water.  He is scheduled to see Dr. Arlester Ladd in follow-up of his TAVR.  I discussed with him my upcoming retirement.  He will be transition to the sleep care of Dr. Micael Adas with yearly follow-up or sooner as needed.  Millicent Ally, MD, [facility], ABSM Diplomate, American Board of Sleep Medicine  02/22/2024 7:47 AM

## 2024-02-21 ENCOUNTER — Telehealth: Payer: Self-pay | Admitting: Cardiovascular Disease

## 2024-02-21 NOTE — Telephone Encounter (Signed)
 A need order for patient cpap. SWO or DWO and most recent office notes. And sleep study as well. Fax number [phone].Please advise

## 2024-02-22 ENCOUNTER — Encounter: Payer: Self-pay | Admitting: Cardiovascular Disease

## 2024-02-28 ENCOUNTER — Telehealth: Payer: Self-pay | Admitting: *Deleted

## 2024-02-28 NOTE — Telephone Encounter (Signed)
 PC received from patient & his daughter, Thomas [name] - they report that patient was seen in ED 2 weeks ago & was dx'd with multiple DVT's in his R leg, he was sent home on Eliquis  10 mg bid x 7 days, then 5 mg daily.  The patient's ankle was getting better, not as swollen, however, his wife was in the hospital & is now in hospice.  He has been sitting with her a lot & now his ankle is swelling again.  They are asking for Dr Bing Buff recommendation - should he increase his dose of Eliquis ?  Per previous documented  conversations with Merita Staples RN & Dr Marguerita Shih, advised patient & his daughter that DVT F/U & Eliquis  management should be handled through his PCP.  They also had question about appointment in August - documentation from visit here in April states F/U is expected in 6 months (October), appointments have been scheduled.  Patient & his daughter verbalize understanding.

## 2024-02-29 NOTE — Telephone Encounter (Signed)
 All paper work sent to [employer] via preferred fax.

## 2024-03-15 DIAGNOSIS — M7989 Other specified soft tissue disorders: Secondary | ICD-10-CM | POA: Diagnosis not present

## 2024-03-15 DIAGNOSIS — I82501 Chronic embolism and thrombosis of unspecified deep veins of right lower extremity: Secondary | ICD-10-CM | POA: Diagnosis not present

## 2024-03-15 DIAGNOSIS — C9000 Multiple myeloma not having achieved remission: Secondary | ICD-10-CM | POA: Diagnosis not present

## 2024-03-28 ENCOUNTER — Ambulatory Visit: Payer: Medicare Other | Admitting: Cardiovascular Disease

## 2024-04-18 ENCOUNTER — Other Ambulatory Visit (HOSPITAL_COMMUNITY): Payer: Self-pay | Admitting: Urology

## 2024-04-18 DIAGNOSIS — C61 Malignant neoplasm of prostate: Secondary | ICD-10-CM

## 2024-04-24 ENCOUNTER — Encounter: Payer: Self-pay | Admitting: Cardiovascular Disease

## 2024-04-24 ENCOUNTER — Ambulatory Visit: Attending: Cardiovascular Disease | Admitting: Cardiovascular Disease

## 2024-04-24 VITALS — BP 128/66 | HR 89 | Ht 64.0 in | Wt 152.2 lb

## 2024-04-24 DIAGNOSIS — I5032 Chronic diastolic (congestive) heart failure: Secondary | ICD-10-CM

## 2024-04-24 DIAGNOSIS — Z952 Presence of prosthetic heart valve: Secondary | ICD-10-CM

## 2024-04-24 DIAGNOSIS — I4819 Other persistent atrial fibrillation: Secondary | ICD-10-CM | POA: Diagnosis not present

## 2024-04-24 MED ORDER — APIXABAN 5 MG PO TABS: 5.0000 mg | ORAL_TABLET | Freq: Two times a day (BID) | ORAL | 1 refills | Status: DC

## 2024-04-24 NOTE — Assessment & Plan Note (Signed)
 Euvolemic on current medical therapy.

## 2024-04-24 NOTE — Progress Notes (Signed)
 Cardiology Office Note:    Date:  04/24/2024   ID:  Thomas, [name] 1935-08-05, MRN [id number]  PCP:  Thomas Channel, MD   [facility] Providers Cardiologist:  Thomas Fell, MD Sleep Medicine:  Thomas Sor, MD (Inactive)     Referring MD: Thomas Channel, MD   No chief complaint on file.   History of Present Illness:    Thomas [name] is a 89 y.o. male presenting for follow-up evaluation.  The patient has a history of permanent atrial fibrillation, chronic HFpEF, stage IIIb chronic kidney disease, and severe low-flow low gradient aortic stenosis status post TAVR in February 2025.  The patient is here with his daughter today.  His wife passed away last month from kidney failure.  He is going to try to stay in the house and remain independent as long as he can.  His daughter is nearby and provides a great deal of support for him.  The patient continues to complain of fatigue.  He has mild exertional dyspnea which is unchanged.  No chest pain or pressure.  No orthopnea, PND, or leg edema.   Current Medications: Current Meds  Medication Sig   apixaban  (ELIQUIS ) 5 MG TABS tablet Take 2 tablets (10mg ) twice daily for 7 days, then 1 tablet (5mg ) twice daily (Patient taking differently: Take 5 mg by mouth 2 (two) times daily.)   azithromycin  (ZITHROMAX ) 500 MG tablet Take 1 tablet (500 mg total) by mouth as directed. Take one tablet 1 hour before any dental work including cleanings.   cholecalciferol (VITAMIN D3) 25 MCG (1000 UNIT) tablet Take 1,000 Units by mouth daily.   dorzolamide -timolol  (COSOPT ) 22.3-6.8 MG/ML ophthalmic solution Place 1 drop into both eyes 2 (two) times daily.   latanoprost  (XALATAN ) 0.005 % ophthalmic solution INSTILL 1 DROP INTO BOTH EYES EVERY DAY AT NIGHT   metoprolol  succinate (TOPROL  XL) 25 MG 24 hr tablet Take 0.5 tablets (12.5 mg total) by mouth daily.   oxyCODONE  (ROXICODONE ) 5 MG immediate release tablet Take 1 tablet (5 mg total) by  mouth every 4 (four) hours as needed for severe pain (pain score 7-10).     Allergies:   Benadryl [diphenhydramine hcl], Ceftriaxone sodium, Nsaids, Penicillins, Farxiga  [dapagliflozin ], Codeine, Doxycycline, Ibuprofen, Levaquin [levofloxacin], Oxybutynin, Tramadol hcl, Chlorhexidine, and Furosemide    ROS:   Please see the history of present illness.    All other systems reviewed and are negative.  EKGs/Labs/Other Studies Reviewed:    The following studies were reviewed today: Cardiac Studies & Procedures   ______________________________________________________________________________________________ CARDIAC CATHETERIZATION  CARDIAC CATHETERIZATION 11/01/2023  Conclusion   There is severe aortic valve stenosis.  1.  Widely patent coronary arteries with minimal irregularity present, no stenoses, right dominant coronary circulation 2.  Calcified and restricted aortic valve leaflets on plain fluoroscopy.  Mean transaortic gradient 23 mmHg, calculated aortic valve area by invasive gradient measurement is 0.89 cm. 3.  Normal right heart hemodynamics with preserved cardiac output, mean PA pressure 23 mmHg, wedge pressure 11 mmHg.  Recommendations: Continue TAVR evaluation.  Okay to resume apixaban  tomorrow morning. Total contrast < 20 cc.  Findings Coronary Findings Diagnostic  Dominance: Right  Left Main The vessel exhibits minimal luminal irregularities. The left main is patent with no stenosis.  Divides into the LAD and left circumflex.  Left Anterior Descending There is mild diffuse disease throughout the vessel. The LAD is patent with mild irregularity proximally but no obstructive disease.  The vessel supplies 2 diagonal branches without stenoses.  The LAD terminates in the distal anterior wall and does not reach the apex.  Left Circumflex The circumflex is patent.  There are minimal irregularities.  The obtuse marginal branch divides into twin vessels.  There are no stenoses in  the circumflex distribution.  Right Coronary Artery Vessel is large. The vessel exhibits minimal luminal irregularities.  Right Ventricular Branch The RCA is a very large, dominant vessel.  It is smooth throughout its course.  The PDA and posterolateral branches are all patent and supplies a large area of myocardium.  There are minimal irregularities with no stenosis.  Intervention  No interventions have been documented.     ECHOCARDIOGRAM  ECHOCARDIOGRAM COMPLETE 12/27/2023  Narrative ECHOCARDIOGRAM REPORT    Patient Name:   Thomas [name] Date of Exam: 12/27/2023 Medical Rec #:  [id number]         Height:       64.8 in Accession #:    [id number]        Weight:       161.0 lb Date of Birth:  24-Jul-1935         BSA:          1.799 m Patient Age:    88 years          BP:           120/70 mmHg Patient Gender: M                 HR:           92 bpm. Exam Location:  [address]  Procedure: 2D Echo, 3D Echo, Cardiac Doppler and Color Doppler (Both Spectral and Color Flow Doppler were utilized during procedure).  Indications:    Z95.2 Status Post TAVR  History:        Patient has prior history of Echocardiogram examinations, most recent 11/29/2023. CHF, Aortic Valve Disease, Arrythmias:Atrial Fibrillation; Risk Factors:Hypertension and Dyslipidemia. Aortic Stenosis status post TAVR (11-28-23, 29mm Celestia MOLDER), Multiple Myeloma. Aortic Valve: 29 mm Sapien prosthetic, stented (TAVR) valve is present in the aortic position.  Sonographer:    Thomas [name] RDCS Referring Phys: Thomas [name]  IMPRESSIONS   1. Left ventricular ejection fraction, by estimation, is 60 to 65%. Left ventricular ejection fraction by 3D volume is 63 %. The left ventricle has normal function. The left ventricle has no regional wall motion abnormalities. Left ventricular diastolic parameters are indeterminate. Elevated left ventricular end-diastolic pressure. 2. Right ventricular systolic function  is normal. The right ventricular size is normal. 3. Left atrial size was severely dilated. 4. The mitral valve is degenerative. Mild to moderate mitral valve regurgitation. No evidence of mitral stenosis. 5. The aortic valve has been repaired/replaced. There is moderate perivalvular Aortic valve regurgitation. No aortic stenosis is present. There is a 29 mm Sapien prosthetic (TAVR) valve present in the aortic position. Aortic regurgitation PHT measures 372 msec. Aortic valve area, by VTI measures 2.59 cm. Aortic valve mean gradient measures 3.6 mmHg. Aortic valve Vmax measures 1.24 m/s. 6. There is mild dilatation of the ascending aorta, measuring 43 mm. 7. The inferior vena cava is normal in size with greater than 50% respiratory variability, suggesting right atrial pressure of 3 mmHg. 8. Compared to echo dated 09/08/23, there is now a normal functioning TAVR with moderate perivalvular AI present.  FINDINGS Left Ventricle: Left ventricular ejection fraction, by estimation, is 60 to 65%. Left ventricular ejection fraction by 3D volume is 63 %. The left ventricle has normal function. The  left ventricle has no regional wall motion abnormalities. The left ventricular internal cavity size was normal in size. There is no left ventricular hypertrophy. Left ventricular diastolic parameters are indeterminate. Elevated left ventricular end-diastolic pressure.  Right Ventricle: The right ventricular size is normal. No increase in right ventricular wall thickness. Right ventricular systolic function is normal.  Left Atrium: Left atrial size was severely dilated.  Right Atrium: Right atrial size was normal in size.  Pericardium: There is no evidence of pericardial effusion.  Mitral Valve: The mitral valve is degenerative in appearance. There is mild calcification of the mitral valve leaflet(s). Mild mitral annular calcification. Mild to moderate mitral valve regurgitation. No evidence of mitral valve  stenosis.  Tricuspid Valve: The tricuspid valve is normal in structure. Tricuspid valve regurgitation is mild . No evidence of tricuspid stenosis.  Aortic Valve: The aortic valve has been repaired/replaced. Aortic valve regurgitation is moderate. Aortic regurgitation PHT measures 372 msec. No aortic stenosis is present. Aortic valve mean gradient measures 3.6 mmHg. Aortic valve peak gradient measures 6.2 mmHg. Aortic valve area, by VTI measures 2.59 cm. There is a 29 mm Sapien prosthetic, stented (TAVR) valve present in the aortic position.  Pulmonic Valve: The pulmonic valve was normal in structure. Pulmonic valve regurgitation is trivial. No evidence of pulmonic stenosis.  Aorta: The aortic root is normal in size and structure. There is mild dilatation of the ascending aorta, measuring 43 mm.  Venous: The inferior vena cava is normal in size with greater than 50% respiratory variability, suggesting right atrial pressure of 3 mmHg.  IAS/Shunts: No atrial level shunt detected by color flow Doppler.  Additional Comments: 3D was performed not requiring image post processing on an independent workstation and was normal.   LEFT VENTRICLE PLAX 2D LVIDd:         4.50 cm         Diastology LVIDs:         1.70 cm         LV e' medial:    6.31 cm/s LV PW:         0.90 cm         LV E/e' medial:  16.3 LV IVS:        0.90 cm         LV e' lateral:   8.16 cm/s LVOT diam:     2.70 cm         LV E/e' lateral: 12.6 LV SV:         65 LV SV Index:   36 LVOT Area:     5.73 cm        3D Volume EF LV 3D EF:    Left ventricul ar ejection fraction by 3D volume is 63 %.  3D Volume EF: 3D EF:        63 % LV EDV:       113 ml LV ESV:       42 ml LV SV:        72 ml  RIGHT VENTRICLE RV Basal diam:  3.40 cm RV S prime:     19.60 cm/s TAPSE (M-mode): 1.9 cm RVSP:           28.0 mmHg  LEFT ATRIUM             Index        RIGHT ATRIUM           Index LA Vol (A2C):   97.8 ml 54.36 ml/m  RA  Pressure: 3.00 mmHg LA Vol (A4C):   93.9 ml 52.19 ml/m  RA Area:     18.60 cm LA Biplane Vol: 99.3 ml 55.19 ml/m  RA Volume:   50.70 ml  28.18 ml/m AORTIC VALVE AV Area (Vmax):    2.86 cm AV Area (Vmean):   2.62 cm AV Area (VTI):     2.59 cm AV Vmax:           124.36 cm/s AV Vmean:          88.080 cm/s AV VTI:            0.249 m AV Peak Grad:      6.2 mmHg AV Mean Grad:      3.6 mmHg LVOT Vmax:         62.13 cm/s LVOT Vmean:        40.267 cm/s LVOT VTI:          0.113 m LVOT/AV VTI ratio: 0.45 AI PHT:            372 msec  AORTA Ao Root diam: 3.30 cm Ao Asc diam:  4.20 cm  MITRAL VALVE                TRICUSPID VALVE MV Area (PHT): cm          TR Peak grad:   25.0 mmHg MV Decel Time: 196 msec     TR Vmax:        250.00 cm/s MR Peak grad: 122.3 mmHg    Estimated RAP:  3.00 mmHg MR Mean grad: 84.0 mmHg     RVSP:           28.0 mmHg MR Vmax:      553.00 cm/s MR Vmean:     429.0 cm/s    SHUNTS MV E velocity: 102.85 cm/s  Systemic VTI:  0.11 m MV A velocity: 41.77 cm/s   Systemic Diam: 2.70 cm MV E/A ratio:  2.46  Wilbert Bihari MD Electronically signed by Wilbert Bihari MD Signature Date/Time: 12/27/2023/3:46:38 PM    Final    MONITORS  LONG TERM MONITOR (3-14 DAYS) 01/23/2024  Narrative The basic rhythm is atrial fibrillation with an average heart rate of 89 bpm, atrial fibrillation burden 100% No high-grade heart block or pathologic pauses There are rare PVCs, less than 1%, with a single 5 beat ventricular run No bradycardic events   CT SCANS  CT CORONARY MORPH W/CTA COR W/SCORE 11/14/2023  Addendum 11/18/2023  9:27 PM ADDENDUM REPORT: 11/18/2023 21:24  EXAM: OVER-READ INTERPRETATION  CT CHEST  The following report is an over-read performed by radiologist Dr. Oneil Devonshire of [facility] on 11/18/2023. This over-read does not include interpretation of cardiac or coronary anatomy or pathology. The coronary calcium  score/coronary CTA interpretation  by the cardiologist is attached.  COMPARISON:  None.  FINDINGS: Cardiovascular: Dilatation of the ascending aorta to 4.1 cm is again noted. No evidence of dissection is seen.  Mediastinum/Nodes: There are no enlarged lymph nodes within the visualized mediastinum.  Lungs/Pleura: There is no pleural effusion. The visualized lungs appear clear.  Upper abdomen: No significant findings in the visualized upper abdomen.  Musculoskeletal/Chest wall: No chest wall mass or suspicious osseous findings within the visualized chest.  IMPRESSION: Dilatation of the ascending aorta to 4.1 cm. Recommend annual imaging followup by CTA or MRA. This recommendation follows 2010 ACCF/AHA/AATS/ACR/ASA/SCA/SCAI/SIR/STS/SVM Guidelines for the Diagnosis and Management of Patients with Thoracic Aortic Disease. Circulation. 2010; 121: Z733-z630. Aortic aneurysm  NOS (ICD10-I71.9)   Electronically Signed By: Oneil Devonshire M.D. On: 11/18/2023 21:24  Narrative CLINICAL DATA:  Aortic Valve pathology with assessment for TAVR  EXAM: Cardiac TAVR CT  TECHNIQUE: The patient was scanned on a Siemens Force 192 slice scanner. A 120 kV retrospective scan was triggered in the descending thoracic aorta at 111 HU's. Gantry rotation speed was 270 msecs and collimation was .9 mm. No beta blockade or nitro were given. The 3D data set was reconstructed in 5% intervals of the R-R cycle. Systolic and diastolic phases were analyzed on a dedicated work station using MPR, MIP and VRT modes. The patient received 95 cc of contrast.  FINDINGS: Aortic Valve: Severely thickened aortic valve with heavy calcification and reduced excursion the planimeter valve area is 1.15 sq cm consistent with moderate to severe aortic stenosis.  Morphology: Functionally bicuspid with right-left fusion/calcified raphe.  Annular calcification: None.  Aortic Valve Calcium  Score: 3049.  Presence of basal septal hypertrophy: Non  severe.  Perimembranous septal diameter: 7 mm.  Mitral Valve: No calcifications.  Aortic Annulus Measurements- 20% phase  Major annulus diameter: 32 mm.  Minor annulus diameter: 29 mm.  Annular perimeter: 94 mm.  Annular area: 6.68 cm2.  Aortic Measurements- 70% phase  Sinotubular Junction: 37 mm.  Ascending Thoracic Aorta: 43 mm.  Aortic Arch: 37 mm.  Descending Thoracic Aorta: 33 mm.  Aortic atherosclerosis.  Sinus of Valsalva Measurements:  Right coronary cusp width: 40 mm  Left coronary cusp width: 42 mm  Non coronary cusp width: 40 mm  Coronary Artery Height above Annulus:  Left Main: 19 mm  Left SoV height: 31 mm  Right Coronary: 22 mm  Right SoV height: 31 mm  Optimum Fluoroscopic Angle for Delivery: LAO 9, CAU 7  Cusp overlay view angle: RAO 0, CAU 19  Valves for structural team consideration:  Sapien 29 mm; upper limits for this valve type. Scene Saved for structural review.  Evolut 34 mm, upper limits for this valve type.  Non TAVR Valve Findings:  Coronary Arteries: Normal coronary origin. Study not completed with nitroglycerin .  Coronary Calcium  Score:  Left main: 0  Left anterior descending artery: 9  Left circumflex artery: 216  Right coronary artery: 4  Total: 229  Percentile: 86th for age, sex, and race matched control.  Systemic veins: Normal anatomy.  Main Pulmonary artery: Mild dilation 30 mm  Pulmonary veins: Normal anatomy  Left atrial appendage: There is lack of contrast opacification of the distal left atrial appendage. In comparison with calcium  score acquisition, mixing artifact favored, cannot exclude thrombus.  Interatrial septum: No communication.  Chamber dimensions: Right ventricular enlargement. Bi-atrial dilation  Pericardium: No calcification.  Extra Cardiac Findings as per separate reporting.  Notable artifacts: Motion artifact, poor contrast opacification of the left side  Image  quality: Poor  IMPRESSION: 1. Moderate to severe aortic stenosis. Findings pertinent to TAVR procedure are detailed above.  2.  Left atrial appendage lack of opacification noted.  3.  Mild aortic dilation.  RECOMMENDATIONS:  The proposed cut-off value of 1,651 AU yielded a 93 % sensitivity and 75 % specificity in grading AS severity in patients with classical low-flow, low-gradient AS. Proposed different cut-off values to define severe AS for men and women as 2,065 AU and 1,274 AU, respectively. The joint European and American recommendations for the assessment of AS consider the aortic valve calcium  score as a continuum - a very high calcium  score suggests severe AS and a low calcium  score suggests  severe AS is unlikely.  Donney VEAR Jarome LULLA Stephen RENETTE, et al. 2017 ESC/EACTS Guidelines for the management of valvular heart disease. Eur Heart J [phone]  Coronary artery calcium  (CAC) score is a strong predictor of incident coronary heart disease (CHD) and provides predictive information beyond traditional risk factors. CAC scoring is reasonable to use in the decision to withhold, postpone, or initiate statin therapy in intermediate-risk or selected borderline-risk asymptomatic adults (age 55-75 years and LDL-C >=70 to <190 mg/dL) who do not have diabetes or established atherosclerotic cardiovascular disease (ASCVD).* In intermediate-risk (10-year ASCVD risk >=7.5% to <20%) adults or selected borderline-risk (10-year ASCVD risk >=5% to <7.5%) adults in whom a CAC score is measured for the purpose of making a treatment decision the following recommendations have been made:  If CAC = 0, it is reasonable to withhold statin therapy and reassess in 5 to 10 years, as long as higher risk conditions are absent (diabetes mellitus, family history of premature CHD in first degree relatives (males <55 years; females <65 years), cigarette smoking, LDL >=190 mg/dL or other independent  risk factors).  If CAC is 1 to 99, it is reasonable to initiate statin therapy for patients >=31 years of age.  If CAC is >=100 or >=75th percentile, it is reasonable to initiate statin therapy at any age.  Cardiology referral should be considered for patients with CAC scores >=400 or >=75th percentile.  *2018 AHA/ACC/AACVPR/AAPA/ABC/ACPM/ADA/AGS/APhA/ASPC/NLA/PCNA Guideline on the Management of Blood Cholesterol: A Report of the American College of Cardiology/American Heart Association Task Force on Clinical Practice Guidelines. J Am Coll Cardiol. 2019;73(24):3168-3209.  Mahesh  Chandrasekhar  Electronically Signed: By: Stanly Leavens M.D. On: 11/14/2023 14:02     ______________________________________________________________________________________________      EKG:        Recent Labs: 11/29/2023: Magnesium  1.7 02/10/2024: ALT 15; BUN 22; Creatinine, Ser 1.51; Hemoglobin 13.2; Platelets 144; Potassium 3.9; Sodium 137  Recent Lipid Panel    Component Value Date/Time   CHOL 164 02/25/2020 0842   TRIG 131 02/25/2020 0842   HDL 41 02/25/2020 0842   CHOLHDL 4.0 02/25/2020 0842   LDLCALC 100 (H) 02/25/2020 0842     Risk Assessment/Calculations:    CHA2DS2-VASc Score = 4   This indicates a 4.8% annual risk of stroke. The patient's score is based upon: CHF History: 1 HTN History: 1 Diabetes History: 0 Stroke History: 0 Vascular Disease History: 0 Age Score: 2 Gender Score: 0               Physical Exam:    VS:  BP 128/66   Pulse 89   Ht 5' 4 (1.626 m)   Wt 152 lb 3.2 oz (69 kg)   SpO2 98%   BMI 26.13 kg/m     Wt Readings from Last 3 Encounters:  04/24/24 152 lb 3.2 oz (69 kg)  02/20/24 160 lb 3.2 oz (72.7 kg)  02/10/24 165 lb (74.8 kg)     GEN: Pleasant elderly male in no acute distress HEENT: Normal NECK: No JVD; No carotid bruits LYMPHATICS: No lymphadenopathy CARDIAC: Irregularly irregular with grade 2/6 systolic murmur at the  right upper sternal border grade 2/6 diastolic decrescendo murmur at the left lower sternal border RESPIRATORY:  Clear to auscultation without rales, wheezing or rhonchi  ABDOMEN: Soft, non-tender, non-distended MUSCULOSKELETAL:  No edema; No deformity  SKIN: Warm and dry NEUROLOGIC:  Alert and oriented x 3 PSYCHIATRIC:  Normal affect   Assessment & Plan Persistent atrial fibrillation (HCC) Treated  with apixaban  for anticoagulation.  Rate control with metoprolol  succinate.  Continue current therapy.  No bleeding problems reported. Chronic diastolic heart failure (HCC) Euvolemic on current medical therapy. S/P TAVR (transcatheter aortic valve replacement) Normal transvalvular gradients.  Mild to moderate paravalvular regurgitation noted.  Plan repeat echo at 12 months out from TAVR.  I personally reviewed his echo images today and agree that he has normal LV function, normal flow parameters through the TAVR prosthesis, and mild to moderate paravalvular regurgitation.  Reviewed these findings with the patient and his daughter.  They understand that we will continue with surveillance of his valve with serial echo exams.            Medication Adjustments/Labs and Tests Ordered: Current medicines are reviewed at length with the patient today.  Concerns regarding medicines are outlined above.  No orders of the defined types were placed in this encounter.  No orders of the defined types were placed in this encounter.   There are no Patient Instructions on file for this visit.   Signed, Thomas Fell, MD  04/24/2024 2:31 PM    [facility]

## 2024-04-24 NOTE — Patient Instructions (Signed)
 Follow-Up: At [facility], you and your health needs are our priority.  As part of our continuing mission to provide you with exceptional heart care, our providers are all part of one team.  This team includes your primary Cardiologist (physician) and Advanced Practice Providers or APPs (Physician Assistants and Nurse Practitioners) who all work together to provide you with the care you need, when you need it.  Your next appointment:   As Scheduled  Provider:   Izetta Hummer, PA-C

## 2024-04-24 NOTE — Assessment & Plan Note (Addendum)
 Normal transvalvular gradients.  Mild to moderate paravalvular regurgitation noted.  Plan repeat echo at 12 months out from TAVR.  I personally reviewed his echo images today and agree that he has normal LV function, normal flow parameters through the TAVR prosthesis, and mild to moderate paravalvular regurgitation.  Reviewed these findings with the patient and his daughter.  They understand that we will continue with surveillance of his valve with serial echo exams.

## 2024-04-29 ENCOUNTER — Encounter (HOSPITAL_COMMUNITY)
Admission: RE | Admit: 2024-04-29 | Discharge: 2024-04-29 | Disposition: A | Discharge status: Home / Self Care | Source: Ambulatory Visit | Attending: Urology | Admitting: Urology

## 2024-04-29 DIAGNOSIS — C61 Malignant neoplasm of prostate: Secondary | ICD-10-CM | POA: Diagnosis present

## 2024-04-29 MED ORDER — FLOTUFOLASTAT F 18 GALLIUM 296-5846 MBQ/ML IV SOLN
7.8300 | Freq: Once | INTRAVENOUS | Status: AC
  Administered 2024-04-29: 7.83 via INTRAVENOUS

## 2024-06-21 ENCOUNTER — Other Ambulatory Visit: Payer: Self-pay | Admitting: Physician Assistant

## 2024-06-24 DIAGNOSIS — H35372 Puckering of macula, left eye: Secondary | ICD-10-CM | POA: Diagnosis not present

## 2024-06-24 DIAGNOSIS — H401132 Primary open-angle glaucoma, bilateral, moderate stage: Secondary | ICD-10-CM | POA: Diagnosis not present

## 2024-06-24 DIAGNOSIS — H04123 Dry eye syndrome of bilateral lacrimal glands: Secondary | ICD-10-CM | POA: Diagnosis not present

## 2024-06-24 DIAGNOSIS — I10 Essential (primary) hypertension: Secondary | ICD-10-CM | POA: Diagnosis not present

## 2024-07-09 ENCOUNTER — Inpatient Hospital Stay: Attending: Internal Medicine

## 2024-07-09 DIAGNOSIS — Z634 Disappearance and death of family member: Secondary | ICD-10-CM | POA: Insufficient documentation

## 2024-07-09 DIAGNOSIS — D649 Anemia, unspecified: Secondary | ICD-10-CM | POA: Diagnosis not present

## 2024-07-09 DIAGNOSIS — I130 Hypertensive heart and chronic kidney disease with heart failure and stage 1 through stage 4 chronic kidney disease, or unspecified chronic kidney disease: Secondary | ICD-10-CM | POA: Insufficient documentation

## 2024-07-09 DIAGNOSIS — N400 Enlarged prostate without lower urinary tract symptoms: Secondary | ICD-10-CM | POA: Insufficient documentation

## 2024-07-09 DIAGNOSIS — Z7952 Long term (current) use of systemic steroids: Secondary | ICD-10-CM | POA: Diagnosis not present

## 2024-07-09 DIAGNOSIS — Z9079 Acquired absence of other genital organ(s): Secondary | ICD-10-CM | POA: Insufficient documentation

## 2024-07-09 DIAGNOSIS — I480 Paroxysmal atrial fibrillation: Secondary | ICD-10-CM | POA: Insufficient documentation

## 2024-07-09 DIAGNOSIS — Z888 Allergy status to other drugs, medicaments and biological substances status: Secondary | ICD-10-CM | POA: Insufficient documentation

## 2024-07-09 DIAGNOSIS — C9000 Multiple myeloma not having achieved remission: Secondary | ICD-10-CM | POA: Diagnosis present

## 2024-07-09 DIAGNOSIS — Z880 Allergy status to penicillin: Secondary | ICD-10-CM | POA: Diagnosis not present

## 2024-07-09 DIAGNOSIS — R5383 Other fatigue: Secondary | ICD-10-CM | POA: Diagnosis not present

## 2024-07-09 DIAGNOSIS — Z79899 Other long term (current) drug therapy: Secondary | ICD-10-CM | POA: Diagnosis not present

## 2024-07-09 DIAGNOSIS — Z9049 Acquired absence of other specified parts of digestive tract: Secondary | ICD-10-CM | POA: Diagnosis not present

## 2024-07-09 DIAGNOSIS — Z885 Allergy status to narcotic agent status: Secondary | ICD-10-CM | POA: Insufficient documentation

## 2024-07-09 DIAGNOSIS — M6281 Muscle weakness (generalized): Secondary | ICD-10-CM | POA: Diagnosis not present

## 2024-07-09 DIAGNOSIS — M879 Osteonecrosis, unspecified: Secondary | ICD-10-CM | POA: Diagnosis not present

## 2024-07-09 DIAGNOSIS — Z7901 Long term (current) use of anticoagulants: Secondary | ICD-10-CM | POA: Diagnosis not present

## 2024-07-09 DIAGNOSIS — Z86018 Personal history of other benign neoplasm: Secondary | ICD-10-CM | POA: Diagnosis not present

## 2024-07-09 DIAGNOSIS — Z881 Allergy status to other antibiotic agents status: Secondary | ICD-10-CM | POA: Insufficient documentation

## 2024-07-09 DIAGNOSIS — I82401 Acute embolism and thrombosis of unspecified deep veins of right lower extremity: Secondary | ICD-10-CM | POA: Insufficient documentation

## 2024-07-09 DIAGNOSIS — C61 Malignant neoplasm of prostate: Secondary | ICD-10-CM | POA: Diagnosis present

## 2024-07-09 DIAGNOSIS — Z886 Allergy status to analgesic agent status: Secondary | ICD-10-CM | POA: Diagnosis not present

## 2024-07-09 DIAGNOSIS — I5032 Chronic diastolic (congestive) heart failure: Secondary | ICD-10-CM | POA: Insufficient documentation

## 2024-07-09 DIAGNOSIS — I350 Nonrheumatic aortic (valve) stenosis: Secondary | ICD-10-CM | POA: Diagnosis not present

## 2024-07-09 LAB — CBC WITH DIFFERENTIAL (CANCER CENTER ONLY)
Abs Immature Granulocytes: 0.1 K/uL — ABNORMAL HIGH (ref 0.00–0.07)
Basophils Absolute: 0 K/uL (ref 0.0–0.1)
Basophils Relative: 0 %
Eosinophils Absolute: 0.1 K/uL (ref 0.0–0.5)
Eosinophils Relative: 1 %
HCT: 37.7 % — ABNORMAL LOW (ref 39.0–52.0)
Hemoglobin: 12.8 g/dL — ABNORMAL LOW (ref 13.0–17.0)
Immature Granulocytes: 1 %
Lymphocytes Relative: 18 %
Lymphs Abs: 1.3 K/uL (ref 0.7–4.0)
MCH: 31.3 pg (ref 26.0–34.0)
MCHC: 34 g/dL (ref 30.0–36.0)
MCV: 92.2 fL (ref 80.0–100.0)
Monocytes Absolute: 0.6 K/uL (ref 0.1–1.0)
Monocytes Relative: 8 %
Neutro Abs: 4.9 K/uL (ref 1.7–7.7)
Neutrophils Relative %: 72 %
Platelet Count: 147 K/uL — ABNORMAL LOW (ref 150–400)
RBC: 4.09 MIL/uL — ABNORMAL LOW (ref 4.22–5.81)
RDW: 14.8 % (ref 11.5–15.5)
WBC Count: 6.9 K/uL (ref 4.0–10.5)
nRBC: 0 % (ref 0.0–0.2)

## 2024-07-09 LAB — CMP (CANCER CENTER ONLY)
ALT: 11 U/L (ref 0–44)
AST: 23 U/L (ref 15–41)
Albumin: 4.4 g/dL (ref 3.5–5.0)
Alkaline Phosphatase: 90 U/L (ref 38–126)
Anion gap: 3 — ABNORMAL LOW (ref 5–15)
BUN: 28 mg/dL — ABNORMAL HIGH (ref 8–23)
CO2: 27 mmol/L (ref 22–32)
Calcium: 10.9 mg/dL — ABNORMAL HIGH (ref 8.9–10.3)
Chloride: 113 mmol/L — ABNORMAL HIGH (ref 98–111)
Creatinine: 1.34 mg/dL — ABNORMAL HIGH (ref 0.61–1.24)
GFR, Estimated: 51 mL/min — ABNORMAL LOW (ref >60)
Glucose, Bld: 103 mg/dL — ABNORMAL HIGH (ref 70–99)
Potassium: 4 mmol/L (ref 3.5–5.1)
Sodium: 143 mmol/L (ref 135–145)
Total Bilirubin: 0.9 mg/dL (ref 0.0–1.2)
Total Protein: 7.4 g/dL (ref 6.5–8.1)

## 2024-07-09 LAB — LACTATE DEHYDROGENASE: LDH: 452 U/L — ABNORMAL HIGH (ref 98–192)

## 2024-07-10 ENCOUNTER — Ambulatory Visit: Payer: Self-pay

## 2024-07-10 LAB — KAPPA/LAMBDA LIGHT CHAINS
Kappa free light chain: 171 mg/L — ABNORMAL HIGH (ref 3.3–19.4)
Kappa, lambda light chain ratio: 12.04 — ABNORMAL HIGH (ref 0.26–1.65)
Lambda free light chains: 14.2 mg/L (ref 5.7–26.3)

## 2024-07-10 LAB — BETA 2 MICROGLOBULIN, SERUM: Beta-2 Microglobulin: 3.8 mg/L — ABNORMAL HIGH (ref 0.6–2.4)

## 2024-07-10 NOTE — Telephone Encounter (Signed)
 Spoke with patient regarding lab results. Per Cassie, PA, patient's calcium  level is slightly elevated. Called to confirm if patient is taking calcium  supplements or consuming calcium -rich foods. Patient denies taking calcium  supplements or multivitamins and will watch his current intake of calcium -rich foods. Patient voiced understanding.

## 2024-07-15 LAB — MULTIPLE MYELOMA PANEL, SERUM
Albumin SerPl Elph-Mcnc: 3.8 g/dL (ref 2.9–4.4)
Albumin/Glob SerPl: 1.3 (ref 0.7–1.7)
Alpha 1: 0.2 g/dL (ref 0.0–0.4)
Alpha2 Glob SerPl Elph-Mcnc: 0.5 g/dL (ref 0.4–1.0)
B-Globulin SerPl Elph-Mcnc: 0.8 g/dL (ref 0.7–1.3)
Gamma Glob SerPl Elph-Mcnc: 1.5 g/dL (ref 0.4–1.8)
Globulin, Total: 3.1 g/dL (ref 2.2–3.9)
IgA: 81 mg/dL (ref 61–437)
IgG (Immunoglobin G), Serum: 1597 mg/dL (ref 603–1613)
IgM (Immunoglobulin M), Srm: 50 mg/dL (ref 15–143)
M Protein SerPl Elph-Mcnc: 1 g/dL — ABNORMAL HIGH
Total Protein ELP: 6.9 g/dL (ref 6.0–8.5)

## 2024-07-16 ENCOUNTER — Inpatient Hospital Stay: Admitting: Internal Medicine

## 2024-07-16 VITALS — BP 116/82 | HR 93 | Temp 97.3°F | Resp 17 | Ht 64.0 in | Wt 154.2 lb

## 2024-07-16 DIAGNOSIS — C9000 Multiple myeloma not having achieved remission: Secondary | ICD-10-CM | POA: Diagnosis not present

## 2024-07-16 DIAGNOSIS — C61 Malignant neoplasm of prostate: Secondary | ICD-10-CM | POA: Diagnosis not present

## 2024-07-16 NOTE — Progress Notes (Signed)
 [facility] [phone]   [phone]  OFFICE PROGRESS NOTE  Teresa Channel, MD [phone] 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 2) History of pheochromocytoma LEFT adrenal gland status post resection on 03/16/2009 3) Prostate adenocarcinoma  PRIOR THERAPY:  1)Systemic therapy with weekly Velcade  1.3 mg/KG subcutaneously, Revlimid  25 mg p.o. daily for 21 days every 4 weeks as well as Decadron  20 mg p.o. weekly.  First dose November 07, 2017.  Status post 7 cycles. 2) definitive radiotherapy to the prostate cancer under the care of Dr. Patrcia.  CURRENT THERAPY: Observation.  INTERVAL HISTORY: Thomas [name] 89 y.o. male returns to the clinic today for 10-month follow-up visit accompanied by his daughter. Discussed the use of AI scribe software for clinical note transcription with the patient, who gave verbal consent to proceed.  History of Present Illness Thomas [name] is an 89 year old male with smoldering multiple myeloma and prostate adenocarcinoma who presents for evaluation and repeat myeloma panel. He is accompanied by his daughter.  He has a history of smoldering multiple myeloma diagnosed in February 2011. He underwent treatment with weekly subcutaneous Velcade , camoravlimid, and Decadron  for seven cycles and has been off treatment since late 2019.  He also has a history of prostate adenocarcinoma and is followed by Doctor Renda and Doctor Patrcia. There have been no changes in treatment for this condition.  He experienced blood clots in his right leg, presenting with pain and swelling. He is currently on Eliquis , with the dose increased to 5 mg twice a day from a previous dose of 2.5 mg twice a day.  He mentions having a new heart valve and is reportedly doing well with it.  He reports the recent  passing of his wife on June 7th due to renal failure, which has been a significant emotional event for him.    MEDICAL HISTORY: Past Medical History:  Diagnosis Date   BPH (benign prostatic hypertrophy)    Chronic diastolic heart failure (HCC) 12/27/2013   Chronic kidney disease    Hx of bilateral renal cysts.   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 Wonda and Dr. Maryjane   Severe aortic stenosis    Sinus node dysfunction (HCC) 08/14/2014   Sleep apnea    Smoldering multiple myeloma     ALLERGIES:  is allergic to benadryl [diphenhydramine hcl], ceftriaxone sodium, nsaids, penicillins, farxiga  [dapagliflozin ], codeine, doxycycline, ibuprofen, levaquin [levofloxacin], oxybutynin, tramadol hcl, chlorhexidine, and furosemide .  MEDICATIONS:  Current Outpatient Medications  Medication Sig Dispense Refill   acetaminophen  (TYLENOL ) 500 MG tablet Take 1,000 mg by mouth 2 (two) times daily as needed for mild pain (pain score 1-3) or moderate pain (pain score 4-6).  apixaban  (ELIQUIS ) 5 MG TABS tablet Take 1 tablet (5 mg total) by mouth 2 (two) times daily. 180 tablet 1   azithromycin  (ZITHROMAX ) 500 MG tablet Take 1 tablet (500 mg total) by mouth as directed. Take one tablet 1 hour before any dental work including cleanings. 6 tablet 12   cholecalciferol (VITAMIN D3) 25 MCG (1000 UNIT) tablet Take 1,000 Units by mouth daily.      dorzolamide -timolol  (COSOPT ) 22.3-6.8 MG/ML ophthalmic solution Place 1 drop into both eyes 2 (two) times daily.     latanoprost  (XALATAN ) 0.005 % ophthalmic solution INSTILL 1 DROP INTO BOTH EYES EVERY DAY AT NIGHT  6   metoprolol  succinate (TOPROL -XL) 25 MG 24 hr tablet TAKE 1 TABLET (25 MG TOTAL) BY MOUTH DAILY. 90 tablet 3   oxyCODONE  (ROXICODONE ) 5 MG immediate release tablet Take 1 tablet (5 mg total) by mouth every 4 (four) hours as needed for severe pain (pain score 7-10). 10 tablet 0   No current facility-administered medications for this visit.    SURGICAL HISTORY:  Past Surgical History:  Procedure Laterality Date   ADRENALECTOMY  02/12/11   Left, for Pheochromocytoma   CARDIOVERSION N/A 12/26/2013   Procedure: CARDIOVERSION;  Surgeon: Vina LULLA Gull, MD;  Location[facility] ENDOSCOPY;  Service: Cardiovascular;  Laterality: N/A;   CHOLECYSTECTOMY  02/12/11   GASTRECTOMY  1984   S/P gastrectomy for ulcer   INTRAOPERATIVE TRANSTHORACIC ECHOCARDIOGRAM N/A 11/28/2023   Procedure: INTRAOPERATIVE TRANSTHORACIC ECHOCARDIOGRAM;  Surgeon: Wonda Sharper, MD;  Location[facility] INVASIVE CV LAB;  Service: Cardiovascular;  Laterality: N/A;   KNEE SURGERY  1971   S/P Right knee surgery   RIGHT/LEFT HEART CATH AND CORONARY ANGIOGRAPHY N/A 11/01/2023   Procedure: RIGHT/LEFT HEART CATH AND CORONARY ANGIOGRAPHY;  Surgeon: Wonda Sharper, MD;  Location: [facility] INVASIVE CV LAB;  Service: Cardiovascular;  Laterality: N/A;   TEE WITHOUT CARDIOVERSION N/A 12/26/2013   Procedure: TRANSESOPHAGEAL ECHOCARDIOGRAM (TEE);  Surgeon: Vina LULLA Gull, MD;  Location: [facility] ENDOSCOPY;  Service: Cardiovascular;  Laterality: N/A;   TRANSURETHRAL RESECTION OF PROSTATE  7/12    REVIEW OF SYSTEMS:  Constitutional: positive for fatigue Eyes: negative Ears, nose, mouth, throat, and face: negative Respiratory: negative Cardiovascular: negative Gastrointestinal: negative Genitourinary:negative Integument/breast:  negative Hematologic/lymphatic: negative Musculoskeletal:positive for muscle weakness Neurological: negative Behavioral/Psych: negative Endocrine: negative Allergic/Immunologic: negative   PHYSICAL EXAMINATION: General appearance: alert, cooperative and no distress Head: Normocephalic, without obvious abnormality, atraumatic Neck: no adenopathy, no JVD, supple, symmetrical, trachea midline and thyroid  not enlarged, symmetric, no tenderness/mass/nodules Lymph nodes: Cervical, supraclavicular, and axillary nodes normal. Resp: clear to auscultation bilaterally Back: symmetric, no curvature. ROM normal. No CVA tenderness. Cardio: regular rate and rhythm, S1, S2 normal, no murmur, click, rub or gallop GI: soft, non-tender; bowel sounds normal; no masses,  no organomegaly Extremities: extremities normal, atraumatic, no cyanosis or edema  ECOG PERFORMANCE STATUS: 1 - Symptomatic but completely ambulatory  There were no vitals taken for this visit.  LABORATORY DATA: Lab Results  Component Value Date   WBC 6.9 07/09/2024   HGB 12.8 (L) 07/09/2024   HCT 37.7 (L) 07/09/2024   MCV 92.2 07/09/2024   PLT 147 (L) 07/09/2024      Chemistry      Component Value Date/Time   NA 143 07/09/2024 1400   NA 141 12/11/2023 1610   NA 138 08/16/2017 1404   K 4.0 07/09/2024 1400   K 4.5 08/16/2017 1404   CL 113 (H) 07/09/2024 1400   CL 109 (H) 03/21/2013 9041  CO2 27 07/09/2024 1400   CO2 24 08/16/2017 1404   BUN 28 (H) 07/09/2024 1400   BUN 27 12/11/2023 1610   BUN 17.3 08/16/2017 1404   CREATININE 1.34 (H) 07/09/2024 1400   CREATININE 1.8 (H) 08/16/2017 1404      Component Value Date/Time   CALCIUM  10.9 (H) 07/09/2024 1400   CALCIUM  9.9 08/16/2017 1404   ALKPHOS 90 07/09/2024 1400   ALKPHOS 86 08/16/2017 1404   AST 23 07/09/2024 1400   AST 22 08/16/2017 1404   ALT 11 07/09/2024 1400   ALT 16 08/16/2017 1404   BILITOT 0.9 07/09/2024 1400   BILITOT 0.78 08/16/2017 1404        RADIOGRAPHIC STUDIES: No results found.  ASSESSMENT AND PLAN:  This is a very pleasant 89 years old white male with IgG smoldering Loma diagnosed in April 2011. The patient has been on observation since 2011. His recent myeloma panel showed further increase in the free kappa light chain. His recent skeletal bone survey showed no concerning findings for disease progression.  He has a focus of avascular necrosis but no significant lytic lesions. His bone marrow bone marrow biopsy and aspirate showed further increase in the plasma cells to 15%. The patient underwent treatment with subcutaneous weekly Velcade  as well as Revlimid  and Decadron  status post 7 cycles. He has been on observation since that time more than 4 years ago.  The patient is feeling fine with no concerning complaints. He had repeat myeloma panel performed recently.  I discussed the lab result with the patient and his daughter. His myeloma panel showed further increase in the free kappa light chain. The patient is currently asymptomatic with no worsening anemia or renal insufficiency. Assessment and Plan Assessment & Plan Smoldering multiple myeloma Smoldering multiple myeloma diagnosed in February 2011, currently on observation. Recent myeloma panel shows stability with mild changes. Hemoglobin is mildly anemic at 12.8, consistent with his baseline. Kappa free light chain increased from 125 to 171, not warranting treatment. Given his age and overall condition, treatment is not recommended as it may not be well tolerated. - Continue observation. - Repeat myeloma panel in six months.  Right lower extremity deep vein thrombosis Right lower extremity DVT on anticoagulation therapy. Previously on 2.5 mg Eliquis  twice daily, now increased to 5 mg twice daily. No current symptoms of DVT such as swelling or pain. - Continue Eliquis  5 mg twice daily.  Mild anemia Mild anemia with hemoglobin at 12.8, slightly below normal for  males but consistent with his historical baseline. No immediate intervention required.  Chronic kidney disease, unspecified stage Chronic kidney disease with recent improvement in kidney function. Serum creatinine is 1.34, better than previous levels. No immediate concerns or interventions required.  Prostate adenocarcinoma Prostate adenocarcinoma under the care of Dr. Renda and Dr. Patrcia. No changes in treatment due to age-related considerations. PET scan performed with no new treatment indicated. He was advised to call immediately if he has any other concerning symptoms in the interval  The patient voices understanding of current disease status and treatment options and is in agreement with the current care plan. All questions were answered. The patient knows to call the clinic with any problems, questions or concerns. We can certainly see the patient much sooner if necessary. The total time spent in the appointment was 30 minutes including review of chart and various tests results, discussions about plan of care and coordination of care plan .  Disclaimer: This note was dictated with voice  recognition software. Similar sounding words can inadvertently be transcribed and may not be corrected upon review.

## 2024-07-23 ENCOUNTER — Other Ambulatory Visit: Payer: Self-pay

## 2024-07-23 DIAGNOSIS — Z952 Presence of prosthetic heart valve: Secondary | ICD-10-CM

## 2024-09-28 ENCOUNTER — Inpatient Hospital Stay (HOSPITAL_COMMUNITY)
Admission: EM | Admit: 2024-09-28 | Discharge: 2024-10-02 | DRG: 522 | Disposition: A | Discharge status: Home Health | Attending: Internal Medicine | Admitting: Internal Medicine

## 2024-09-28 ENCOUNTER — Emergency Department (HOSPITAL_COMMUNITY)

## 2024-09-28 ENCOUNTER — Other Ambulatory Visit: Payer: Self-pay

## 2024-09-28 DIAGNOSIS — H9190 Unspecified hearing loss, unspecified ear: Secondary | ICD-10-CM | POA: Diagnosis present

## 2024-09-28 DIAGNOSIS — C9000 Multiple myeloma not having achieved remission: Secondary | ICD-10-CM | POA: Diagnosis present

## 2024-09-28 DIAGNOSIS — Z883 Allergy status to other anti-infective agents status: Secondary | ICD-10-CM

## 2024-09-28 DIAGNOSIS — Z8546 Personal history of malignant neoplasm of prostate: Secondary | ICD-10-CM | POA: Diagnosis not present

## 2024-09-28 DIAGNOSIS — Z79899 Other long term (current) drug therapy: Secondary | ICD-10-CM

## 2024-09-28 DIAGNOSIS — N1832 Chronic kidney disease, stage 3b: Secondary | ICD-10-CM | POA: Diagnosis present

## 2024-09-28 DIAGNOSIS — Z880 Allergy status to penicillin: Secondary | ICD-10-CM

## 2024-09-28 DIAGNOSIS — I4821 Permanent atrial fibrillation: Secondary | ICD-10-CM | POA: Diagnosis present

## 2024-09-28 DIAGNOSIS — Y9301 Activity, walking, marching and hiking: Secondary | ICD-10-CM | POA: Diagnosis present

## 2024-09-28 DIAGNOSIS — Y92008 Other place in unspecified non-institutional (private) residence as the place of occurrence of the external cause: Secondary | ICD-10-CM

## 2024-09-28 DIAGNOSIS — E039 Hypothyroidism, unspecified: Secondary | ICD-10-CM | POA: Diagnosis present

## 2024-09-28 DIAGNOSIS — Z886 Allergy status to analgesic agent status: Secondary | ICD-10-CM | POA: Diagnosis not present

## 2024-09-28 DIAGNOSIS — M1711 Unilateral primary osteoarthritis, right knee: Secondary | ICD-10-CM | POA: Diagnosis present

## 2024-09-28 DIAGNOSIS — Z885 Allergy status to narcotic agent status: Secondary | ICD-10-CM

## 2024-09-28 DIAGNOSIS — I129 Hypertensive chronic kidney disease with stage 1 through stage 4 chronic kidney disease, or unspecified chronic kidney disease: Secondary | ICD-10-CM | POA: Diagnosis not present

## 2024-09-28 DIAGNOSIS — N189 Chronic kidney disease, unspecified: Secondary | ICD-10-CM | POA: Diagnosis not present

## 2024-09-28 DIAGNOSIS — G8929 Other chronic pain: Secondary | ICD-10-CM | POA: Diagnosis present

## 2024-09-28 DIAGNOSIS — Z9079 Acquired absence of other genital organ(s): Secondary | ICD-10-CM

## 2024-09-28 DIAGNOSIS — I130 Hypertensive heart and chronic kidney disease with heart failure and stage 1 through stage 4 chronic kidney disease, or unspecified chronic kidney disease: Secondary | ICD-10-CM | POA: Diagnosis present

## 2024-09-28 DIAGNOSIS — S72001A Fracture of unspecified part of neck of right femur, initial encounter for closed fracture: Principal | ICD-10-CM | POA: Diagnosis present

## 2024-09-28 DIAGNOSIS — G4733 Obstructive sleep apnea (adult) (pediatric): Secondary | ICD-10-CM | POA: Diagnosis present

## 2024-09-28 DIAGNOSIS — Z881 Allergy status to other antibiotic agents status: Secondary | ICD-10-CM

## 2024-09-28 DIAGNOSIS — E7800 Pure hypercholesterolemia, unspecified: Secondary | ICD-10-CM | POA: Diagnosis present

## 2024-09-28 DIAGNOSIS — I5032 Chronic diastolic (congestive) heart failure: Secondary | ICD-10-CM | POA: Diagnosis present

## 2024-09-28 DIAGNOSIS — W19XXXA Unspecified fall, initial encounter: Secondary | ICD-10-CM

## 2024-09-28 DIAGNOSIS — Z952 Presence of prosthetic heart valve: Secondary | ICD-10-CM | POA: Diagnosis not present

## 2024-09-28 DIAGNOSIS — Z808 Family history of malignant neoplasm of other organs or systems: Secondary | ICD-10-CM

## 2024-09-28 DIAGNOSIS — N400 Enlarged prostate without lower urinary tract symptoms: Secondary | ICD-10-CM | POA: Diagnosis present

## 2024-09-28 DIAGNOSIS — E872 Acidosis, unspecified: Secondary | ICD-10-CM | POA: Diagnosis present

## 2024-09-28 DIAGNOSIS — Z7901 Long term (current) use of anticoagulants: Secondary | ICD-10-CM

## 2024-09-28 DIAGNOSIS — Z8042 Family history of malignant neoplasm of prostate: Secondary | ICD-10-CM

## 2024-09-28 DIAGNOSIS — Z888 Allergy status to other drugs, medicaments and biological substances status: Secondary | ICD-10-CM

## 2024-09-28 DIAGNOSIS — W010XXA Fall on same level from slipping, tripping and stumbling without subsequent striking against object, initial encounter: Secondary | ICD-10-CM | POA: Diagnosis present

## 2024-09-28 DIAGNOSIS — Z96641 Presence of right artificial hip joint: Secondary | ICD-10-CM

## 2024-09-28 DIAGNOSIS — Z9049 Acquired absence of other specified parts of digestive tract: Secondary | ICD-10-CM

## 2024-09-28 DIAGNOSIS — I959 Hypotension, unspecified: Secondary | ICD-10-CM | POA: Diagnosis not present

## 2024-09-28 DIAGNOSIS — Z9221 Personal history of antineoplastic chemotherapy: Secondary | ICD-10-CM

## 2024-09-28 DIAGNOSIS — D62 Acute posthemorrhagic anemia: Secondary | ICD-10-CM | POA: Diagnosis present

## 2024-09-28 LAB — CBC WITH DIFFERENTIAL/PLATELET
Abs Immature Granulocytes: 0.21 K/uL — ABNORMAL HIGH (ref 0.00–0.07)
Basophils Absolute: 0 K/uL (ref 0.0–0.1)
Basophils Relative: 0 %
Eosinophils Absolute: 0.1 K/uL (ref 0.0–0.5)
Eosinophils Relative: 1 %
HCT: 33.2 % — ABNORMAL LOW (ref 39.0–52.0)
Hemoglobin: 10.7 g/dL — ABNORMAL LOW (ref 13.0–17.0)
Immature Granulocytes: 2 %
Lymphocytes Relative: 7 %
Lymphs Abs: 0.7 K/uL (ref 0.7–4.0)
MCH: 30.1 pg (ref 26.0–34.0)
MCHC: 32.2 g/dL (ref 30.0–36.0)
MCV: 93.3 fL (ref 80.0–100.0)
Monocytes Absolute: 0.6 K/uL (ref 0.1–1.0)
Monocytes Relative: 5 %
Neutro Abs: 8.9 K/uL — ABNORMAL HIGH (ref 1.7–7.7)
Neutrophils Relative %: 85 %
Platelets: 179 K/uL (ref 150–400)
RBC: 3.56 MIL/uL — ABNORMAL LOW (ref 4.22–5.81)
RDW: 14.4 % (ref 11.5–15.5)
WBC: 10.5 K/uL (ref 4.0–10.5)
nRBC: 0 % (ref 0.0–0.2)

## 2024-09-28 LAB — TYPE AND SCREEN
ABO/RH(D): O POS
Antibody Screen: NEGATIVE

## 2024-09-28 LAB — COMPREHENSIVE METABOLIC PANEL WITH GFR
ALT: 11 U/L (ref 0–44)
AST: 27 U/L (ref 15–41)
Albumin: 4 g/dL (ref 3.5–5.0)
Alkaline Phosphatase: 129 U/L — ABNORMAL HIGH (ref 38–126)
Anion gap: 8 (ref 5–15)
BUN: 24 mg/dL — ABNORMAL HIGH (ref 8–23)
CO2: 21 mmol/L — ABNORMAL LOW (ref 22–32)
Calcium: 9.6 mg/dL (ref 8.9–10.3)
Chloride: 111 mmol/L (ref 98–111)
Creatinine, Ser: 1.48 mg/dL — ABNORMAL HIGH (ref 0.61–1.24)
GFR, Estimated: 46 mL/min — ABNORMAL LOW
Glucose, Bld: 119 mg/dL — ABNORMAL HIGH (ref 70–99)
Potassium: 4.5 mmol/L (ref 3.5–5.1)
Sodium: 141 mmol/L (ref 135–145)
Total Bilirubin: 0.6 mg/dL (ref 0.0–1.2)
Total Protein: 7 g/dL (ref 6.5–8.1)

## 2024-09-28 LAB — CBC
HCT: 32.1 % — ABNORMAL LOW (ref 39.0–52.0)
Hemoglobin: 10.3 g/dL — ABNORMAL LOW (ref 13.0–17.0)
MCH: 29.8 pg (ref 26.0–34.0)
MCHC: 32.1 g/dL (ref 30.0–36.0)
MCV: 92.8 fL (ref 80.0–100.0)
Platelets: 162 K/uL (ref 150–400)
RBC: 3.46 MIL/uL — ABNORMAL LOW (ref 4.22–5.81)
RDW: 14.5 % (ref 11.5–15.5)
WBC: 10.6 K/uL — ABNORMAL HIGH (ref 4.0–10.5)
nRBC: 0 % (ref 0.0–0.2)

## 2024-09-28 LAB — PROTIME-INR
INR: 1.2 (ref 0.8–1.2)
Prothrombin Time: 16 s — ABNORMAL HIGH (ref 11.4–15.2)

## 2024-09-28 MED ORDER — OXYCODONE HCL 5 MG PO TABS
5.0000 mg | ORAL_TABLET | ORAL | Status: DC | PRN
  Administered 2024-09-29: 10 mg via ORAL
  Filled 2024-09-28: qty 2

## 2024-09-28 MED ORDER — POLYETHYLENE GLYCOL 3350 17 G PO PACK: 17.0000 g | PACK | Freq: Every day | ORAL | Status: DC | PRN

## 2024-09-28 MED ORDER — METOPROLOL SUCCINATE ER 25 MG PO TB24
12.5000 mg | ORAL_TABLET | Freq: Every day | ORAL | Status: DC
  Administered 2024-09-28: 12.5 mg via ORAL
  Filled 2024-09-28: qty 1

## 2024-09-28 MED ORDER — LIDOCAINE 5 % EX PTCH
2.0000 | MEDICATED_PATCH | Freq: Every day | CUTANEOUS | Status: AC
  Administered 2024-09-28 – 2024-09-29 (×2): 2 via TRANSDERMAL
  Filled 2024-09-28 (×2): qty 2

## 2024-09-28 MED ORDER — ONDANSETRON HCL 4 MG/2ML IJ SOLN
4.0000 mg | Freq: Once | INTRAMUSCULAR | Status: AC
  Administered 2024-09-28: 4 mg via INTRAVENOUS
  Filled 2024-09-28: qty 2

## 2024-09-28 MED ORDER — ACETAMINOPHEN 500 MG PO TABS: 500.0000 mg | ORAL_TABLET | Freq: Four times a day (QID) | ORAL | Status: DC | PRN

## 2024-09-28 MED ORDER — PROCHLORPERAZINE EDISYLATE 10 MG/2ML IJ SOLN
5.0000 mg | Freq: Four times a day (QID) | INTRAMUSCULAR | Status: DC | PRN
  Filled 2024-09-28: qty 2

## 2024-09-28 MED ORDER — MELATONIN 5 MG PO TABS: 5.0000 mg | ORAL_TABLET | Freq: Every evening | ORAL | Status: DC | PRN

## 2024-09-28 MED ORDER — HYDROMORPHONE HCL 1 MG/ML IJ SOLN
0.5000 mg | INTRAMUSCULAR | Status: AC
  Administered 2024-09-28: 0.5 mg via INTRAVENOUS

## 2024-09-28 MED ORDER — LACTATED RINGERS IV SOLN: INTRAVENOUS | Status: AC

## 2024-09-28 MED ORDER — HYDROMORPHONE HCL 1 MG/ML IJ SOLN
0.5000 mg | INTRAMUSCULAR | Status: DC | PRN
  Administered 2024-09-29: 0.5 mg via INTRAVENOUS
  Filled 2024-09-28 (×2): qty 1

## 2024-09-28 MED ORDER — MORPHINE SULFATE (PF) 4 MG/ML IV SOLN
4.0000 mg | Freq: Once | INTRAVENOUS | Status: AC
  Administered 2024-09-28: 4 mg via INTRAVENOUS
  Filled 2024-09-28: qty 1

## 2024-09-28 NOTE — ED Notes (Signed)
 Patient transported to CT

## 2024-09-28 NOTE — ED Provider Notes (Signed)
 " Thomas [name] EMERGENCY DEPARTMENT AT [facility] Provider Note   CSN: [id number] Arrival date & time: 09/28/24  1906     Patient presents with: Thomas [name] is a 89 y.o. male history of A-fib on Eliquis , here presenting with fall.  Patient tripped over something and fell forward and hit his head and landed on the right hip.  Patient was noted to have right hip shortening and rotation.  Patient was given fentanyl  prior to arrival.  Level 2 trauma activated since patient is on Eliquis    The history is provided by the patient.       Prior to Admission medications  Medication Sig Start Date End Date Taking? Authorizing Provider  acetaminophen  (TYLENOL ) 500 MG tablet Take 1,000 mg by mouth 2 (two) times daily as needed for mild pain (pain score 1-3) or moderate pain (pain score 4-6).    [provider]  apixaban  (ELIQUIS ) 5 MG TABS tablet Take 1 tablet (5 mg total) by mouth 2 (two) times daily. 04/24/24   Wonda Sharper, MD  azithromycin  (ZITHROMAX ) 500 MG tablet Take 1 tablet (500 mg total) by mouth as directed. Take one tablet 1 hour before any dental work including cleanings. 12/06/23   Sebastian Lamarr SAUNDERS, PA-C  cholecalciferol (VITAMIN D3) 25 MCG (1000 UNIT) tablet Take 1,000 Units by mouth daily.    [provider]  dorzolamide -timolol  (COSOPT ) 22.3-6.8 MG/ML ophthalmic solution Place 1 drop into both eyes 2 (two) times daily. 11/03/21   [provider]  latanoprost  (XALATAN ) 0.005 % ophthalmic solution INSTILL 1 DROP INTO BOTH EYES EVERY DAY AT NIGHT 01/03/18   [provider]  metoprolol  succinate (TOPROL -XL) 25 MG 24 hr tablet TAKE 1 TABLET (25 MG TOTAL) BY MOUTH DAILY. 06/21/24   Wonda Sharper, MD  oxyCODONE  (ROXICODONE ) 5 MG immediate release tablet Take 1 tablet (5 mg total) by mouth every 4 (four) hours as needed for severe pain (pain score 7-10). 02/10/24   Donnajean Lynwood DEL, PA-C    Allergies: Benadryl [diphenhydramine hcl],  Ceftriaxone sodium, Nsaids, Penicillins, Farxiga  [dapagliflozin ], Codeine, Doxycycline, Ibuprofen, Levaquin [levofloxacin], Oxybutynin, Tramadol hcl, Chlorhexidine , and Furosemide     Review of Systems  Musculoskeletal:        Right hip pain  All other systems reviewed and are negative.   Updated Vital Signs BP (!) 190/80 (BP Location: Left Arm)   Temp 97.7 F (36.5 C) (Oral)   Ht 5' 4 (1.626 m)   Wt 73.9 kg   SpO2 98%   BMI 27.98 kg/m   Physical Exam Vitals and nursing note reviewed.  Constitutional:      Comments: Slightly uncomfortable  HENT:     Head: Normocephalic.     Comments: Mild frontal hematoma    Nose: Nose normal.     Mouth/Throat:     Mouth: Mucous membranes are moist.  Eyes:     Extraocular Movements: Extraocular movements intact.     Pupils: Pupils are equal, round, and reactive to light.  Cardiovascular:     Rate and Rhythm: Normal rate and regular rhythm.     Pulses: Normal pulses.     Heart sounds: Normal heart sounds.  Pulmonary:     Effort: Pulmonary effort is normal.     Breath sounds: Normal breath sounds.  Abdominal:     General: Abdomen is flat.     Palpations: Abdomen is soft.  Musculoskeletal:     Cervical back: Normal range of motion.  Comments: Right hip is shortened and rotated.  2+ DP pulse  Skin:    General: Skin is warm.     Capillary Refill: Capillary refill takes less than 2 seconds.  Neurological:     General: No focal deficit present.     Mental Status: He is alert and oriented to person, place, and time.  Psychiatric:        Mood and Affect: Mood normal.        Behavior: Behavior normal.     (all labs ordered are listed, but only abnormal results are displayed) Labs Reviewed  CBC WITH DIFFERENTIAL/PLATELET  COMPREHENSIVE METABOLIC PANEL WITH GFR  PROTIME-INR  TYPE AND SCREEN    EKG: EKG Interpretation Date/Time:  Saturday September 28 2024 19:35:26 EST Ventricular Rate:  95 PR Interval:    QRS  Duration:  93 QT Interval:  373 QTC Calculation: 469 R Axis:   69  Text Interpretation: Atrial flutter with predominant 3:1 AV block Minimal ST depression, inferior leads No significant change since last tracing Confirmed by Patt Alm DEL [phone]) on 09/28/2024 7:36:39 PM  Radiology: No results found.   Procedures   Medications Ordered in the ED  morphine  (PF) 4 MG/ML injection 4 mg (4 mg Intravenous Given 09/28/24 1924)  ondansetron  (ZOFRAN ) injection 4 mg (4 mg Intravenous Given 09/28/24 1924)                                    Medical Decision Making Thomas [name] is a 89 y.o. male here presenting with fall.  Patient did have a head injury and also hit his right hip.  Concern for possible hip fracture versus intracranial bleeding.  Plan to get CT head and cervical spine and chest x-ray and pelvis x-ray and right femur x-ray.  9:01 PM X-ray showed right femoral neck fracture.  Discussed with Dr. Barton who recommend hospitalist admission and transfer to [facility] for operation on Monday.  Discussed with hospitalist service for admission.  CT head and cervical spine unremarkable.  Labs unremarkable  Problems Addressed: Closed fracture of neck of right femur, initial encounter [facility]): acute illness or injury Fall, initial encounter: acute illness or injury  Amount and/or Complexity of Data Reviewed Labs: ordered. Decision-making details documented in ED Course. Radiology: ordered and independent interpretation performed. Decision-making details documented in ED Course. ECG/medicine tests: ordered and independent interpretation performed. Decision-making details documented in ED Course.  Risk Prescription drug management.     Final diagnoses:  None    ED Discharge Orders     None          Patt Alm Macho, MD 09/28/24 2102  "

## 2024-09-28 NOTE — Care Plan (Signed)
 Orthopaedic Surgery Plan of Care Note   -history and imaging reviewed with requesting team (ER) -pt has right displaced FNF -pt on Eliquis  -admit to Hospitalist team -reviewed with Dr. Edna who has kindly agreed to manage pt with plan for surgery at [facility] on Monday 09/30/24 -please keep NPO and hold Eliquis  accordingly -full consult note to follow in the AM by Dr. Edna Lillia Mountain, MD Orthopaedic Surgery [facility]

## 2024-09-28 NOTE — ED Triage Notes (Signed)
 89 yo M BIB [facility] from home for a mechanical fall on Eloquis. Pt tripped over a seam and hit the R front of his head. Denies LOC. Pt endorses pain from his R hip down his entire R leg. Pt with chronic R knee pain. EMS notes Shortening and rotation and pt AAO 4. Given 50mcg of fent in route.   EMS VITALS:  HR 90-120 AFIB W/HX  BP 170/83  O2 98% RA  RR 18   22 G L HAND

## 2024-09-28 NOTE — H&P (Addendum)
 " History and Physical  Thomas [name] FMW:[id number] DOB: 02-19-1935 DOA: 09/28/2024  Referring physician: Dr. Patt, EDP  PCP: Teresa Channel, MD  Outpatient Specialists: Cardiology Patient coming from: Home  Chief Complaint: Fall   HPI: Thomas [name] is a 89 y.o. male with medical history significant for permanent atrial fibrillation on Eliquis , chronic HFpEF, severe aortic stenosis status post TAVR, OSA on CPAP, hypertension, hyperlipidemia, CKD 3B, who presents to the ER via EMS due to a mechanical fall.  The patient was in his usual state of health prior to this event.  While walking in his home he tripped and fell.  At baseline ambulates with a cane.  Last dose of Eliquis  was taken on 09/28/2024 morning.  Denies loss of consciousness.  Hit the right front of his head and landed on his right hip.  EMS was activated.  Upon EMS arrival, the patient was alert and oriented x 4.  Vital signs were stable.  He received 50 mcg of fentanyl  en route.  In the ER, right hip x-ray revealed acute displaced right femoral neck fracture.  Suprapatellar knee joint effusion with intra-articular loose bodies.  Lab studies were notable for hemoglobin 10.7 from baseline of 12K.  EDP discussed the case with orthopedic surgery who recommended admission by the medicine team and transfer to [facility] for orthopedic surgical repair on Monday, 09/30/2024.  Admitted by [facility], hospitalist service.  ED Course: Temperature 97.7.  BP 132/95, pulse 102, respiration rate 14, O2 saturation 97% on room air.  Review of Systems: Review of systems as noted in the HPI. All other systems reviewed and are negative.   Past Medical History:  Diagnosis Date   BPH (benign prostatic hypertrophy)    Chronic diastolic heart failure (HCC) 12/27/2013   Chronic kidney disease    Hx of bilateral renal cysts.   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 Wonda and Dr. Maryjane   Severe aortic stenosis    Sinus node dysfunction (HCC) 08/14/2014   Sleep apnea    Smoldering multiple myeloma    Past Surgical History:  Procedure Laterality Date   ADRENALECTOMY  02/12/11   Left, for Pheochromocytoma   CARDIOVERSION N/A 12/26/2013   Procedure: CARDIOVERSION;  Surgeon: Vina LULLA Gull, MD;  Location: [facility] ENDOSCOPY;  Service: Cardiovascular;  Laterality: N/A;   CHOLECYSTECTOMY  02/12/11   GASTRECTOMY  1984   S/P gastrectomy for ulcer   INTRAOPERATIVE TRANSTHORACIC ECHOCARDIOGRAM N/A 11/28/2023   Procedure: INTRAOPERATIVE TRANSTHORACIC ECHOCARDIOGRAM;  Surgeon: Wonda Sharper, MD;  Location: [facility] INVASIVE CV LAB;  Service: Cardiovascular;  Laterality: N/A;   KNEE SURGERY  1971   S/P Right knee surgery   RIGHT/LEFT HEART CATH AND CORONARY ANGIOGRAPHY N/A 11/01/2023   Procedure: RIGHT/LEFT HEART CATH AND CORONARY ANGIOGRAPHY;  Surgeon: Wonda Sharper, MD;  Location: [facility] INVASIVE CV LAB;  Service: Cardiovascular;  Laterality:  N/A;   TEE WITHOUT CARDIOVERSION N/A 12/26/2013   Procedure: TRANSESOPHAGEAL ECHOCARDIOGRAM (TEE);  Surgeon: Vina LULLA Gull, MD;  Location: [facility] ENDOSCOPY;  Service: Cardiovascular;  Laterality: N/A;   TRANSURETHRAL RESECTION OF PROSTATE  7/12    Social History:  reports that he has never smoked. He has never used smokeless tobacco. He reports that he does not drink alcohol and does not use drugs.   Allergies[1]  Family History  Problem Relation Age of  Onset   Prostate cancer Father    Prostate cancer Brother    Polymyositis Mother    Liver disease Sister    Liver disease Sister    Liver disease Sister    Melanoma Brother    Kidney Stones Brother       Prior to Admission medications  Medication Sig Start Date End Date Taking? Authorizing Provider  acetaminophen  (TYLENOL ) 500 MG tablet Take 1,000 mg by mouth 2 (two) times daily as needed for mild pain (pain score 1-3) or moderate pain (pain score 4-6).    [provider]  apixaban  (ELIQUIS ) 5 MG TABS tablet Take 1 tablet (5 mg total) by mouth 2 (two) times daily. 04/24/24   Wonda Sharper, MD  azithromycin  (ZITHROMAX ) 500 MG tablet Take 1 tablet (500 mg total) by mouth as directed. Take one tablet 1 hour before any dental work including cleanings. 12/06/23   Sebastian Lamarr SAUNDERS, PA-C  cholecalciferol (VITAMIN D3) 25 MCG (1000 UNIT) tablet Take 1,000 Units by mouth daily.    [provider]  dorzolamide -timolol  (COSOPT ) 22.3-6.8 MG/ML ophthalmic solution Place 1 drop into both eyes 2 (two) times daily. 11/03/21   [provider]  latanoprost  (XALATAN ) 0.005 % ophthalmic solution INSTILL 1 DROP INTO BOTH EYES EVERY DAY AT NIGHT 01/03/18   [provider]  metoprolol  succinate (TOPROL -XL) 25 MG 24 hr tablet TAKE 1 TABLET (25 MG TOTAL) BY MOUTH DAILY. 06/21/24   Wonda Sharper, MD  oxyCODONE  (ROXICODONE ) 5 MG immediate release tablet Take 1 tablet (5 mg total) by mouth every 4 (four) hours as needed for severe pain (pain score 7-10). 02/10/24   Donnajean Lynwood DEL, PA-C    Physical Exam: BP (!) 190/80   Pulse (!) 117   Temp 97.7 F (36.5 C) (Oral)   Resp 19   Ht 5' 4 (1.626 m)   Wt 73.9 kg   SpO2 98%   BMI 27.97 kg/m   General: 89 y.o. year-old male well developed well nourished in no acute distress.  Alert and oriented x3.  Very hard of hearing. Cardiovascular: Regular rate and rhythm with no rubs or gallops.  No thyromegaly or JVD noted.  No lower  extremity edema. 2/4 pulses in all 4 extremities. Respiratory: Clear to auscultation with no wheezes or rales. Good inspiratory effort. Abdomen: Soft nontender nondistended with normal bowel sounds x4 quadrants. Muskuloskeletal: No cyanosis, clubbing or edema noted bilaterally.  Right lower extremity is shortened and externally rotated. Neuro: CN II-XII intact, strength, sensation, reflexes Skin: No ulcerative lesions noted or rashes Psychiatry: Judgement and insight appear normal. Mood is appropriate for condition and setting          Labs on Admission:  Basic Metabolic Panel: Recent Labs  Lab 09/28/24 1929  NA 141  K 4.5  CL 111  CO2 21*  GLUCOSE 119*  BUN 24*  CREATININE 1.48*  CALCIUM  9.6   Liver Function Tests: Recent Labs  Lab 09/28/24 1929  AST 27  ALT 11  ALKPHOS 129*  BILITOT  0.6  PROT 7.0  ALBUMIN  4.0   No results for input(s): LIPASE, AMYLASE in the last 168 hours. No results for input(s): AMMONIA in the last 168 hours. CBC: Recent Labs  Lab 09/28/24 1929  WBC 10.5  NEUTROABS 8.9*  HGB 10.7*  HCT 33.2*  MCV 93.3  PLT 179   Cardiac Enzymes: No results for input(s): CKTOTAL, CKMB, CKMBINDEX, TROPONINI in the last 168 hours.  BNP (last 3 results) No results for input(s): BNP in the last 8760 hours.  ProBNP (last 3 results) No results for input(s): PROBNP in the last 8760 hours.  CBG: No results for input(s): GLUCAP in the last 168 hours.  Radiological Exams on Admission: DG Pelvis Portable Result Date: 09/28/2024 EXAM: 1 OR 2 VIEW(S) XRAY OF THE PELVIS 09/28/2024 07:27:00 PM COMPARISON: None available. CLINICAL HISTORY: fall FINDINGS: BONES AND JOINTS: Acute displaced right femoral neck fracture with varus angulation. No right hip dislocation. No acute displaced fracture or dislocation of the left hip. No acute displaced fracture or dislocation of the bones of the pelvis. Poorly visualized sacrum - limited evaluation due to  overlapping osseous structures and overlying soft tissues. Degenerative changes of the visualized lower lumbar spine. SOFT TISSUES: Surgical clips overlying the pelvis. The remaining visualized soft tissues are unremarkable. IMPRESSION: 1. Acute displaced right femoral neck fracture. 2. Limited evaluation of the sacrum. Electronically signed by: Morgane Naveau MD 09/28/2024 08:46 PM EST RP Workstation: [id number]   CT Cervical Spine Wo Contrast Result Date: 09/28/2024 EXAM: CT CERVICAL SPINE WITHOUT CONTRAST 09/28/2024 08:15:42 PM TECHNIQUE: CT of the cervical spine was performed without the administration of intravenous contrast. Multiplanar reformatted images are provided for review. Automated exposure control, iterative reconstruction, and/or weight based adjustment of the mA/kV was utilized to reduce the radiation dose to as low as reasonably achievable. COMPARISON: None available. CLINICAL HISTORY: Neck trauma (Age >= 65y) FINDINGS: BONES AND ALIGNMENT: No acute fracture or traumatic malalignment. DEGENERATIVE CHANGES: Multilevel moderate-to-severe degenerative change of the spine most prominent at the C5-C7 levels. Moderate-to-severe left C3-C4 osseous neural foraminal stenosis. No severe osseous central canal stenosis. SOFT TISSUES: No prevertebral soft tissue swelling. IMPRESSION: 1. No evidence of acute traumatic injury. Electronically signed by: Morgane Naveau MD 09/28/2024 08:45 PM EST RP Workstation: [id number]   CT HEAD WO CONTRAST ( ) Result Date: 09/28/2024 EXAM: CT HEAD WITHOUT CONTRAST 09/28/2024 08:15:42 PM TECHNIQUE: CT of the head was performed without the administration of intravenous contrast. Automated exposure control, iterative reconstruction, and/or weight based adjustment of the mA/kV was utilized to reduce the radiation dose to as low as reasonably achievable. COMPARISON: CT head 01/23/2022. CLINICAL HISTORY: Head trauma, minor (Age >= 65y) FINDINGS: BRAIN AND VENTRICLES: No  acute hemorrhage. No evidence of acute infarct. No hydrocephalus. No extra-axial collection. No mass effect or midline shift. Mild periventricular white matter disease. ORBITS: Bilateral lens replacement. SINUSES: No acute abnormality. SOFT TISSUES AND SKULL: No acute soft tissue abnormality. No skull fracture. VASCULATURE: Mild calcific atheromatous disease within carotid siphons. IMPRESSION: 1. No acute intracranial abnormality related to trauma. Electronically signed by: Morgane Naveau MD 09/28/2024 08:44 PM EST RP Workstation: [id number]   DG Femur Min 2 Views Right Result Date: 09/28/2024 EXAM: 2 VIEW(S) XRAY OF THE RIGHT FEMUR 09/28/2024 07:27:00 PM COMPARISON: None available. CLINICAL HISTORY: fall FINDINGS: BONES AND JOINTS: Acute displaced right femoral neck fracture. No acute fracture or mortise distally. Suprapatellar knee joint effusion with intra-articular loose bodies anteriorly along the joint space as well as posteriorly along the popliteal fossa.  Mild-to-moderate tricompartmental degenerative changes of the knee. SOFT TISSUES: Vascular calcifications. IMPRESSION: 1. Acute displaced right femoral neck fracture. 2. Suprapatellar knee joint effusion with intra-articular loose bodies. Electronically signed by: Morgane Naveau MD 09/28/2024 08:36 PM EST RP Workstation: [id number]   DG Chest Port 1 View Result Date: 09/28/2024 EXAM: 1 VIEW(S) XRAY OF THE CHEST 09/28/2024 07:27:00 PM COMPARISON: 11/24/2023 CLINICAL HISTORY: fall FINDINGS: LUNGS AND PLEURA: No focal pulmonary opacity. No pleural effusion. No pneumothorax. HEART AND MEDIASTINUM: Surgical clips at GE junction. Tortuous thoracic aorta. TAVR noted. BONES AND SOFT TISSUES: No acute osseous abnormality. IMPRESSION: 1. No acute process. Electronically signed by: Morgane Naveau MD 09/28/2024 08:25 PM EST RP Workstation: [id number]    EKG: I independently viewed the EKG done and my findings are as followed: Atrial flutter rate of 95.  QTc  469.  Assessment/Plan Present on Admission:  Closed displaced fracture of right femoral neck (HCC)  Principal Problem:   Closed displaced fracture of right femoral neck (HCC)  Acute closed displaced fracture right femoral neck, post mechanical fall, POA Pain control Continue to hold off home Eliquis  Plan for orthopedic surgical repair on 09/30/2024 Further management per orthopedic surgery.  Acute blood loss anemia in the setting of bone fracture At baseline hemoglobin 12-13 K Presented with hemoglobin level of 10.7 Closely monitor H&H Transfuse PRBCs when indicated.  Mild non-anion gap metabolic acidosis Serum bicarb 21, anion gap 8 Continue IV fluid hydration LR at 50 cc/h x 1 day. Repeat chemistry panel in the morning.  Permanent atrial fibrillation on Eliquis  Last dose of Eliquis  was taken on 09/28/24 AM Resume home Toprol -XL for rate control Continue to closely monitor on telemetry.  CKD 3B Renal function is close to baseline Presented with creatinine 1.48 with GFR 46 Continue IV fluid hydration LR at 50 cc/h x 1 day. Avoid nephrotoxic agents, dehydration, and hypotension.  Chronic HFpEF Euvolemic on exam Closely monitor volume status while on gentle IV fluid hydration Last 2D echo done on 12/27/2023 revealed LVEF 60 to 65%  OSA CPAP nightly.   Time: 75 minutes.   DVT prophylaxis: SCDs  Code Status: Full code.  Family Communication: Daughter and son at bedside.  Disposition Plan: Admitted to telemetry unit at [facility] per orthopedic surgery request.  Consults called: Orthopedic surgery.  Admission status: Inpatient status.   Status is: Inpatient The patient requires at least 2 midnight for further evaluation and treatment of present condition.   Terry LOISE Hurst MD [facility] Pager [phone]  If 7PM-7AM, please contact night-coverage [email or web address] Password [email or web address]  09/28/2024, 9:51 PM      [1]  Allergies Allergen  Reactions   Benadryl [Diphenhydramine Hcl] Other (See Comments)    Bad dreams -  Hallucinations.   Ceftriaxone Sodium Hives   Nsaids Other (See Comments)    REACTION: BLEEDING ULCER   Penicillins Hives   Farxiga  [Dapagliflozin ] Rash   Codeine     loopy   Doxycycline Nausea And Vomiting    Stomach pain   Ibuprofen Hives    Was told to stay off it by Dr. Claudene.   Levaquin [Levofloxacin]     Unknown reaction    Oxybutynin Other (See Comments)    Lightheaded, dizzy, unbalanced. Disturbed my equilibrium.   Tramadol Hcl     Other reaction(s): dizzy/nauseated   Chlorhexidine  Rash    Use alcohol for skin prep per patient request.    Furosemide  Diarrhea, Itching and Rash   "

## 2024-09-29 ENCOUNTER — Inpatient Hospital Stay (HOSPITAL_COMMUNITY)

## 2024-09-29 ENCOUNTER — Encounter (HOSPITAL_COMMUNITY): Payer: Self-pay | Admitting: Internal Medicine

## 2024-09-29 ENCOUNTER — Encounter (HOSPITAL_COMMUNITY)
Admission: EM | Disposition: A | Discharge status: Home Health | Payer: Self-pay | Source: Home / Self Care | Attending: Internal Medicine

## 2024-09-29 ENCOUNTER — Inpatient Hospital Stay (HOSPITAL_COMMUNITY): Admitting: Anesthesiology

## 2024-09-29 DIAGNOSIS — I129 Hypertensive chronic kidney disease with stage 1 through stage 4 chronic kidney disease, or unspecified chronic kidney disease: Secondary | ICD-10-CM

## 2024-09-29 DIAGNOSIS — Z96641 Presence of right artificial hip joint: Secondary | ICD-10-CM

## 2024-09-29 DIAGNOSIS — N189 Chronic kidney disease, unspecified: Secondary | ICD-10-CM

## 2024-09-29 DIAGNOSIS — S72001A Fracture of unspecified part of neck of right femur, initial encounter for closed fracture: Secondary | ICD-10-CM

## 2024-09-29 HISTORY — PX: TOTAL HIP ARTHROPLASTY: SHX124

## 2024-09-29 LAB — CBC WITH DIFFERENTIAL/PLATELET
Abs Immature Granulocytes: 0.14 K/uL — ABNORMAL HIGH (ref 0.00–0.07)
Basophils Absolute: 0 K/uL (ref 0.0–0.1)
Basophils Relative: 0 %
Eosinophils Absolute: 0.1 K/uL (ref 0.0–0.5)
Eosinophils Relative: 1 %
HCT: 31.5 % — ABNORMAL LOW (ref 39.0–52.0)
Hemoglobin: 10.2 g/dL — ABNORMAL LOW (ref 13.0–17.0)
Immature Granulocytes: 1 %
Lymphocytes Relative: 8 %
Lymphs Abs: 0.8 K/uL (ref 0.7–4.0)
MCH: 29.8 pg (ref 26.0–34.0)
MCHC: 32.4 g/dL (ref 30.0–36.0)
MCV: 92.1 fL (ref 80.0–100.0)
Monocytes Absolute: 1 K/uL (ref 0.1–1.0)
Monocytes Relative: 10 %
Neutro Abs: 8.2 K/uL — ABNORMAL HIGH (ref 1.7–7.7)
Neutrophils Relative %: 80 %
Platelets: 147 K/uL — ABNORMAL LOW (ref 150–400)
RBC: 3.42 MIL/uL — ABNORMAL LOW (ref 4.22–5.81)
RDW: 14.3 % (ref 11.5–15.5)
WBC: 10.3 K/uL (ref 4.0–10.5)
nRBC: 0 % (ref 0.0–0.2)

## 2024-09-29 LAB — COMPREHENSIVE METABOLIC PANEL WITH GFR
ALT: 15 U/L (ref 0–44)
AST: 31 U/L (ref 15–41)
Albumin: 3.7 g/dL (ref 3.5–5.0)
Alkaline Phosphatase: 113 U/L (ref 38–126)
Anion gap: 8 (ref 5–15)
BUN: 22 mg/dL (ref 8–23)
CO2: 20 mmol/L — ABNORMAL LOW (ref 22–32)
Calcium: 9.2 mg/dL (ref 8.9–10.3)
Chloride: 109 mmol/L (ref 98–111)
Creatinine, Ser: 1.43 mg/dL — ABNORMAL HIGH (ref 0.61–1.24)
GFR, Estimated: 47 mL/min — ABNORMAL LOW
Glucose, Bld: 122 mg/dL — ABNORMAL HIGH (ref 70–99)
Potassium: 4.6 mmol/L (ref 3.5–5.1)
Sodium: 137 mmol/L (ref 135–145)
Total Bilirubin: 0.7 mg/dL (ref 0.0–1.2)
Total Protein: 6.5 g/dL (ref 6.5–8.1)

## 2024-09-29 LAB — SURGICAL PCR SCREEN
MRSA, PCR: NEGATIVE
Staphylococcus aureus: NEGATIVE

## 2024-09-29 LAB — PHOSPHORUS: Phosphorus: 2.8 mg/dL (ref 2.5–4.6)

## 2024-09-29 LAB — MAGNESIUM: Magnesium: 1.8 mg/dL (ref 1.7–2.4)

## 2024-09-29 SURGERY — ARTHROPLASTY, HIP, TOTAL, ANTERIOR APPROACH
Anesthesia: General | Site: Hip | Laterality: Right

## 2024-09-29 MED ORDER — ACETAMINOPHEN 10 MG/ML IV SOLN
INTRAVENOUS | Status: AC
  Filled 2024-09-29: qty 100

## 2024-09-29 MED ORDER — ALUM & MAG HYDROXIDE-SIMETH 200-200-20 MG/5ML PO SUSP: 30.0000 mL | ORAL | Status: DC | PRN

## 2024-09-29 MED ORDER — AMISULPRIDE (ANTIEMETIC) 5 MG/2ML IV SOLN: 10.0000 mg | Freq: Once | INTRAVENOUS | Status: DC | PRN

## 2024-09-29 MED ORDER — POVIDONE-IODINE 10 % EX SWAB
2.0000 | Freq: Once | CUTANEOUS | Status: AC
  Administered 2024-09-29: 2 via TOPICAL

## 2024-09-29 MED ORDER — FENTANYL CITRATE (PF) 100 MCG/2ML IJ SOLN: 25.0000 ug | INTRAMUSCULAR | Status: DC | PRN

## 2024-09-29 MED ORDER — SENNA 8.6 MG PO TABS
2.0000 | ORAL_TABLET | Freq: Every day | ORAL | Status: DC
  Administered 2024-09-30 – 2024-10-01 (×2): 17.2 mg via ORAL
  Filled 2024-09-29: qty 2

## 2024-09-29 MED ORDER — SUCCINYLCHOLINE CHLORIDE 200 MG/10ML IV SOSY
PREFILLED_SYRINGE | INTRAVENOUS | Status: AC
  Filled 2024-09-29: qty 20

## 2024-09-29 MED ORDER — METOPROLOL SUCCINATE ER 25 MG PO TB24
25.0000 mg | ORAL_TABLET | Freq: Every day | ORAL | Status: DC
  Administered 2024-09-29 – 2024-10-02 (×4): 25 mg via ORAL
  Filled 2024-09-29: qty 1

## 2024-09-29 MED ORDER — METHOCARBAMOL 1000 MG/10ML IJ SOLN: 500.0000 mg | Freq: Four times a day (QID) | INTRAMUSCULAR | Status: DC | PRN

## 2024-09-29 MED ORDER — SUGAMMADEX SODIUM 200 MG/2ML IV SOLN
INTRAVENOUS | Status: DC | PRN
  Administered 2024-09-29: 200 mg via INTRAVENOUS

## 2024-09-29 MED ORDER — KETOROLAC TROMETHAMINE 30 MG/ML IJ SOLN
INTRAMUSCULAR | Status: AC
  Filled 2024-09-29: qty 1

## 2024-09-29 MED ORDER — LIDOCAINE 2% (20 MG/ML) 5 ML SYRINGE
INTRAMUSCULAR | Status: AC
  Filled 2024-09-29: qty 10

## 2024-09-29 MED ORDER — CEFAZOLIN SODIUM-DEXTROSE 2-4 GM/100ML-% IV SOLN
2.0000 g | INTRAVENOUS | Status: AC
  Administered 2024-09-29: 2 g via INTRAVENOUS
  Filled 2024-09-29: qty 100

## 2024-09-29 MED ORDER — MENTHOL 3 MG MT LOZG: 1.0000 | LOZENGE | OROMUCOSAL | Status: DC | PRN

## 2024-09-29 MED ORDER — POLYETHYLENE GLYCOL 3350 17 G PO PACK
17.0000 g | PACK | Freq: Two times a day (BID) | ORAL | Status: DC
  Administered 2024-09-30 – 2024-10-02 (×3): 17 g via ORAL
  Filled 2024-09-29: qty 1

## 2024-09-29 MED ORDER — DEXAMETHASONE SOD PHOSPHATE PF 10 MG/ML IJ SOLN
10.0000 mg | Freq: Once | INTRAMUSCULAR | Status: AC
  Administered 2024-09-30: 10 mg via INTRAVENOUS

## 2024-09-29 MED ORDER — PROPOFOL 10 MG/ML IV BOLUS
INTRAVENOUS | Status: DC | PRN
  Administered 2024-09-29: 100 mg via INTRAVENOUS

## 2024-09-29 MED ORDER — OXYCODONE HCL 5 MG PO TABS: 5.0000 mg | ORAL_TABLET | Freq: Once | ORAL | Status: DC | PRN

## 2024-09-29 MED ORDER — CHLORHEXIDINE GLUCONATE 0.12 % MT SOLN
OROMUCOSAL | Status: AC
  Filled 2024-09-29: qty 15

## 2024-09-29 MED ORDER — OXYCODONE HCL 5 MG/5ML PO SOLN: 5.0000 mg | Freq: Once | ORAL | Status: DC | PRN

## 2024-09-29 MED ORDER — HYDROMORPHONE HCL 1 MG/ML IJ SOLN: 0.5000 mg | INTRAMUSCULAR | Status: DC | PRN

## 2024-09-29 MED ORDER — METHOCARBAMOL 500 MG PO TABS
500.0000 mg | ORAL_TABLET | Freq: Four times a day (QID) | ORAL | Status: DC | PRN
  Administered 2024-09-30: 500 mg via ORAL

## 2024-09-29 MED ORDER — PHENYLEPHRINE 80 MCG/ML (10ML) SYRINGE FOR IV PUSH (FOR BLOOD PRESSURE SUPPORT)
PREFILLED_SYRINGE | INTRAVENOUS | Status: AC
  Filled 2024-09-29: qty 20

## 2024-09-29 MED ORDER — EPHEDRINE 5 MG/ML INJ
INTRAVENOUS | Status: AC
  Filled 2024-09-29: qty 5

## 2024-09-29 MED ORDER — LATANOPROST 0.005 % OP SOLN
1.0000 [drp] | Freq: Every day | OPHTHALMIC | Status: DC
  Administered 2024-09-29 – 2024-10-01 (×3): 1 [drp] via OPHTHALMIC
  Filled 2024-09-29 (×2): qty 2.5

## 2024-09-29 MED ORDER — DEXAMETHASONE SOD PHOSPHATE PF 10 MG/ML IJ SOLN
INTRAMUSCULAR | Status: DC | PRN
  Administered 2024-09-29: 4 mg via INTRAVENOUS

## 2024-09-29 MED ORDER — ROCURONIUM BROMIDE 10 MG/ML (PF) SYRINGE
PREFILLED_SYRINGE | INTRAVENOUS | Status: DC | PRN
  Administered 2024-09-29: 50 mg via INTRAVENOUS

## 2024-09-29 MED ORDER — ORAL CARE MOUTH RINSE
15.0000 mL | Freq: Once | OROMUCOSAL | Status: AC
  Administered 2024-09-29: 15 mL via OROMUCOSAL

## 2024-09-29 MED ORDER — LACTATED RINGERS IV SOLN: INTRAVENOUS | Status: DC

## 2024-09-29 MED ORDER — ROCURONIUM BROMIDE 10 MG/ML (PF) SYRINGE
PREFILLED_SYRINGE | INTRAVENOUS | Status: AC
  Filled 2024-09-29: qty 20

## 2024-09-29 MED ORDER — DORZOLAMIDE HCL-TIMOLOL MAL 2-0.5 % OP SOLN
1.0000 [drp] | Freq: Two times a day (BID) | OPHTHALMIC | Status: DC
  Administered 2024-09-29 – 2024-10-02 (×7): 1 [drp] via OPHTHALMIC
  Filled 2024-09-29 (×2): qty 10

## 2024-09-29 MED ORDER — OXYCODONE HCL 5 MG PO TABS
5.0000 mg | ORAL_TABLET | ORAL | Status: DC | PRN
  Administered 2024-10-01 – 2024-10-02 (×2): 10 mg via ORAL

## 2024-09-29 MED ORDER — TRANEXAMIC ACID-NACL 1000-0.7 MG/100ML-% IV SOLN
1000.0000 mg | INTRAVENOUS | Status: AC
  Administered 2024-09-29: 1000 mg via INTRAVENOUS

## 2024-09-29 MED ORDER — ONDANSETRON HCL 4 MG PO TABS: 4.0000 mg | ORAL_TABLET | Freq: Four times a day (QID) | ORAL | Status: DC | PRN

## 2024-09-29 MED ORDER — FENTANYL CITRATE (PF) 250 MCG/5ML IJ SOLN
INTRAMUSCULAR | Status: AC
  Filled 2024-09-29: qty 5

## 2024-09-29 MED ORDER — TRANEXAMIC ACID-NACL 1000-0.7 MG/100ML-% IV SOLN
1000.0000 mg | Freq: Once | INTRAVENOUS | Status: AC
  Administered 2024-09-29: 1000 mg via INTRAVENOUS
  Filled 2024-09-29: qty 100

## 2024-09-29 MED ORDER — PHENOL 1.4 % MT LIQD: 1.0000 | OROMUCOSAL | Status: DC | PRN

## 2024-09-29 MED ORDER — FENTANYL CITRATE (PF) 250 MCG/5ML IJ SOLN
INTRAMUSCULAR | Status: DC | PRN
  Administered 2024-09-29 (×2): 50 ug via INTRAVENOUS

## 2024-09-29 MED ORDER — ACETAMINOPHEN 10 MG/ML IV SOLN
INTRAVENOUS | Status: DC | PRN
  Administered 2024-09-29: 1000 mg via INTRAVENOUS

## 2024-09-29 MED ORDER — PHENYLEPHRINE 80 MCG/ML (10ML) SYRINGE FOR IV PUSH (FOR BLOOD PRESSURE SUPPORT)
PREFILLED_SYRINGE | INTRAVENOUS | Status: DC | PRN
  Administered 2024-09-29: 160 ug via INTRAVENOUS
  Administered 2024-09-29: 80 ug via INTRAVENOUS

## 2024-09-29 MED ORDER — SODIUM CHLORIDE 0.9 % IR SOLN
Status: DC | PRN
  Administered 2024-09-29: 1000 mL

## 2024-09-29 MED ORDER — HYDROMORPHONE HCL 1 MG/ML IJ SOLN
1.0000 mg | INTRAMUSCULAR | Status: DC | PRN
  Administered 2024-09-29: 1 mg via INTRAVENOUS
  Filled 2024-09-29: qty 1

## 2024-09-29 MED ORDER — HYDROMORPHONE HCL 1 MG/ML IJ SOLN
0.5000 mg | Freq: Once | INTRAMUSCULAR | Status: AC
  Administered 2024-09-29: 0.5 mg via INTRAVENOUS
  Filled 2024-09-29: qty 1

## 2024-09-29 MED ORDER — METOPROLOL TARTRATE 5 MG/5ML IV SOLN
5.0000 mg | Freq: Once | INTRAVENOUS | Status: AC
  Administered 2024-09-29: 5 mg via INTRAVENOUS
  Filled 2024-09-29: qty 5

## 2024-09-29 MED ORDER — PHENYLEPHRINE 80 MCG/ML (10ML) SYRINGE FOR IV PUSH (FOR BLOOD PRESSURE SUPPORT)
PREFILLED_SYRINGE | INTRAVENOUS | Status: AC
  Filled 2024-09-29: qty 10

## 2024-09-29 MED ORDER — METOCLOPRAMIDE HCL 5 MG/ML IJ SOLN: 5.0000 mg | Freq: Three times a day (TID) | INTRAMUSCULAR | Status: DC | PRN

## 2024-09-29 MED ORDER — METOCLOPRAMIDE HCL 5 MG PO TABS: 5.0000 mg | ORAL_TABLET | Freq: Three times a day (TID) | ORAL | Status: DC | PRN

## 2024-09-29 MED ORDER — ONDANSETRON HCL 4 MG/2ML IJ SOLN
INTRAMUSCULAR | Status: AC
  Filled 2024-09-29: qty 6

## 2024-09-29 MED ORDER — APIXABAN 5 MG PO TABS
5.0000 mg | ORAL_TABLET | Freq: Two times a day (BID) | ORAL | Status: DC
  Administered 2024-09-30 – 2024-10-02 (×5): 5 mg via ORAL

## 2024-09-29 MED ORDER — CEFAZOLIN SODIUM-DEXTROSE 2-4 GM/100ML-% IV SOLN
2.0000 g | Freq: Four times a day (QID) | INTRAVENOUS | Status: AC
  Administered 2024-09-29 – 2024-09-30 (×2): 2 g via INTRAVENOUS
  Filled 2024-09-29: qty 100

## 2024-09-29 MED ORDER — BISACODYL 10 MG RE SUPP: 10.0000 mg | Freq: Every day | RECTAL | Status: DC | PRN

## 2024-09-29 MED ORDER — SODIUM CHLORIDE 0.9 % IV SOLN: INTRAVENOUS | Status: DC

## 2024-09-29 MED ORDER — ACETAMINOPHEN 325 MG PO TABS
650.0000 mg | ORAL_TABLET | Freq: Four times a day (QID) | ORAL | Status: DC
  Administered 2024-09-29 – 2024-10-02 (×10): 650 mg via ORAL
  Filled 2024-09-29: qty 2

## 2024-09-29 MED ORDER — CHLORHEXIDINE GLUCONATE 0.12 % MT SOLN: 15.0000 mL | Freq: Once | OROMUCOSAL | Status: AC

## 2024-09-29 MED ORDER — TRANEXAMIC ACID-NACL 1000-0.7 MG/100ML-% IV SOLN
INTRAVENOUS | Status: AC
  Filled 2024-09-29: qty 100

## 2024-09-29 MED ORDER — CEFAZOLIN SODIUM-DEXTROSE 2-4 GM/100ML-% IV SOLN
INTRAVENOUS | Status: AC
  Filled 2024-09-29: qty 100

## 2024-09-29 MED ORDER — SUGAMMADEX SODIUM 200 MG/2ML IV SOLN
INTRAVENOUS | Status: AC
  Filled 2024-09-29: qty 2

## 2024-09-29 MED ORDER — ONDANSETRON HCL 4 MG/2ML IJ SOLN: 4.0000 mg | Freq: Four times a day (QID) | INTRAMUSCULAR | Status: DC | PRN

## 2024-09-29 MED ORDER — ONDANSETRON HCL 4 MG/2ML IJ SOLN
INTRAMUSCULAR | Status: DC | PRN
  Administered 2024-09-29: 4 mg via INTRAVENOUS

## 2024-09-29 MED ORDER — OXYCODONE HCL 5 MG PO TABS
2.5000 mg | ORAL_TABLET | ORAL | Status: DC | PRN
  Administered 2024-09-30 – 2024-10-01 (×3): 5 mg via ORAL

## 2024-09-29 MED ORDER — ALBUMIN HUMAN 5 % IV SOLN: INTRAVENOUS | Status: DC | PRN

## 2024-09-29 SURGICAL SUPPLY — 54 items
BAG COUNTER SPONGE SURGICOUNT (BAG) ×1 IMPLANT
BLADE CLIPPER SURG (BLADE) IMPLANT
BLADE SAG 18X100X1.27 (BLADE) ×1 IMPLANT
COVER BACK TABLE 24X17X13 BIG (DRAPES) IMPLANT
COVER SURGICAL LIGHT HANDLE (MISCELLANEOUS) ×1 IMPLANT
CUP ACETBLR 54 OD PINNACLE (Hips) IMPLANT
DERMABOND ADVANCED .7 DNX12 (GAUZE/BANDAGES/DRESSINGS) ×1 IMPLANT
DERMABOND ADVANCED .7 DNX6 (GAUZE/BANDAGES/DRESSINGS) IMPLANT
DRAPE C-ARM 42X72 X-RAY (DRAPES) ×1 IMPLANT
DRAPE IMP U-DRAPE 54X76 (DRAPES) ×1 IMPLANT
DRAPE STERI IOBAN 125X83 (DRAPES) ×1 IMPLANT
DRAPE U-SHAPE 47X51 STRL (DRAPES) ×3 IMPLANT
DRSG AQUACEL AG ADV 3.5X10 (GAUZE/BANDAGES/DRESSINGS) ×1 IMPLANT
DRSG TEGADERM 4X4.75 (GAUZE/BANDAGES/DRESSINGS) ×1 IMPLANT
DURAPREP 26ML APPLICATOR (WOUND CARE) ×1 IMPLANT
ELECT BLADE TIP CTD 4 INCH (ELECTRODE) IMPLANT
ELECTRODE REM PT RTRN 9FT ADLT (ELECTROSURGICAL) ×1 IMPLANT
EVACUATOR 1/8 PVC DRAIN (DRAIN) ×1 IMPLANT
FACESHIELD WRAPAROUND OR TEAM (MASK) ×1 IMPLANT
GAUZE SPONGE 2X2 8PLY STRL LF (GAUZE/BANDAGES/DRESSINGS) ×1 IMPLANT
GLOVE BIOGEL PI IND STRL 7.5 (GLOVE) ×3 IMPLANT
GLOVE BIOGEL PI IND STRL 8 (GLOVE) ×1 IMPLANT
GLOVE BIOGEL PI MICRO 8.0 (GLOVE) ×2 IMPLANT
GLOVE PI ORTHO PRO STRL 7.5 (GLOVE) ×2 IMPLANT
GLOVE SURG ENC MOIS LTX SZ6 (GLOVE) ×1 IMPLANT
GLOVE SURG UNDER POLY LF SZ6.5 (GLOVE) ×2 IMPLANT
GOWN BRE IMP SLV AUR LG STRL (GOWN DISPOSABLE) ×1 IMPLANT
GOWN STRL REIN 3XL XLG LVL4 (GOWN DISPOSABLE) ×1 IMPLANT
GOWN STRL REUS W/ TWL LRG LVL3 (GOWN DISPOSABLE) ×2 IMPLANT
HEAD ARTICULEZE (Hips) IMPLANT
KIT BASIN OR (CUSTOM PROCEDURE TRAY) ×1 IMPLANT
KIT TURNOVER KIT B (KITS) ×1 IMPLANT
LINER NEUTRAL 54X36MM PLUS 4 (Hips) IMPLANT
MANIFOLD NEPTUNE II (INSTRUMENTS) ×1 IMPLANT
PACK TOTAL JOINT (CUSTOM PROCEDURE TRAY) ×1 IMPLANT
PACK UNIVERSAL I (CUSTOM PROCEDURE TRAY) ×1 IMPLANT
PAD ARMBOARD POSITIONER FOAM (MISCELLANEOUS) ×2 IMPLANT
RESTRAINT LIMB HOLDER UNIV (RESTRAINTS) ×1 IMPLANT
RETRACTOR WND ALEXIS 18 MED (MISCELLANEOUS) ×1 IMPLANT
SCREW 6.5MMX25MM (Screw) IMPLANT
SCREW 6.5MMX30MM (Screw) IMPLANT
SOLN 0.9% NACL POUR BTL 1000ML (IV SOLUTION) ×1 IMPLANT
SOLN STERILE WATER BTL 1000 ML (IV SOLUTION) ×3 IMPLANT
SPONGE T-LAP 4X18 ~~LOC~~+RFID (SPONGE) IMPLANT
STAPLER SKIN PROX 35W (STAPLE) ×1 IMPLANT
STEM FEMORAL SZ5 HIGH ACTIS (Stem) IMPLANT
SUCTION TUBE FRAZIER 10FR DISP (SUCTIONS) ×1 IMPLANT
SUT MNCRL AB 4-0 PS2 18 (SUTURE) ×1 IMPLANT
SUT VIC AB 1 CT1 27XBRD ANBCTR (SUTURE) ×4 IMPLANT
SUT VIC AB 2-0 CT1 TAPERPNT 27 (SUTURE) ×2 IMPLANT
SUTURE STRATFX 0 PDS 27 VIOLET (SUTURE) ×1 IMPLANT
TOWEL GREEN STERILE (TOWEL DISPOSABLE) ×1 IMPLANT
TOWEL GREEN STERILE FF (TOWEL DISPOSABLE) ×1 IMPLANT
TRAY FOLEY MTR SLVR 16FR STAT (SET/KITS/TRAYS/PACK) IMPLANT

## 2024-09-29 NOTE — Discharge Instructions (Signed)

## 2024-09-29 NOTE — ED Notes (Signed)
 Pt placed on 4L due to O2 sat dropping after pain med.

## 2024-09-29 NOTE — H&P (View-Only) (Signed)
 Patient ID: Thomas [name], male   DOB: 04/28/1935, 89 y.o.   MRN: [id number]  Discussed with Dr. Edna the condition of this patient's right hip.  He has a displaced right femoral neck fracture. We are able to get him to the operating room today on 09/21/2024 to perform a right total hip replacement to manage his hip fracture. Orders placed He has been n.p.o. for today. His Eliquis  use was reviewed.  His history and physical notes as well as consult notes were reviewed.

## 2024-09-29 NOTE — Progress Notes (Signed)
 Patient ID: Thomas [name], male   DOB: 18-Apr-1935, 89 y.o.   MRN: [id number]  Patient with right femoral neck fracture. We will try to get him into the operating room today to perform a right total hip replacement to treat his right hip fracture. Maintain n.p.o. Orders will follow as well as full note.

## 2024-09-29 NOTE — Interval H&P Note (Signed)
 History and Physical Interval Note:  09/29/2024 3:07 PM  Thomas [name]  has presented today for surgery, with the diagnosis of RIGHT HIP FRACTURE.  The various methods of treatment have been discussed with the patient and family. After consideration of risks, benefits and other options for treatment, the patient has consented to  Procedures: ARTHROPLASTY, HIP, TOTAL, ANTERIOR APPROACH (Right) as a surgical intervention.  The patient's history has been reviewed, patient examined, no change in status, stable for surgery.  I have reviewed the patient's chart and labs.  Questions were answered to the patient's satisfaction.     [name]

## 2024-09-29 NOTE — Progress Notes (Signed)
 Patient ID: Thomas [name], male   DOB: 04/28/1935, 89 y.o.   MRN: [id number]  Discussed with Dr. Edna the condition of this patient's right hip.  He has a displaced right femoral neck fracture. We are able to get him to the operating room today on 09/21/2024 to perform a right total hip replacement to manage his hip fracture. Orders placed He has been n.p.o. for today. His Eliquis  use was reviewed.  His history and physical notes as well as consult notes were reviewed.

## 2024-09-29 NOTE — Progress Notes (Signed)
" ° °  Brief Progress Note   _____________________________________________________________________________________________________________  Patient Name: Thomas [name] Patient DOB: Feb 27, 1935 Date: @TODAY @      Data:  89 y.o. male presented to ED with c/o fall    Action: Reviewed the patient's vital signs, lab results, and clinical notes to assess if patient appropriate to level-load from [facility] to [facility] for admission.    Response:  Provider request patient stay at [facility] for admission.  _____________________________________________________________________________________________________________  [facility] RN Expeditor Maiana Hennigan S Maile Linford Please contact us  directly via secure chat (search for [facility]) or by calling us  at [phone] [facility]).  "

## 2024-09-29 NOTE — Transfer of Care (Signed)
 Immediate Anesthesia Transfer of Care Note  Patient: Thomas [name]  Procedure(s) Performed: ARTHROPLASTY, HIP, TOTAL, ANTERIOR APPROACH (Right: Hip)  Patient Location: PACU  Anesthesia Type:General  Level of Consciousness: sedated  Airway & Oxygen Therapy: Patient connected to nasal cannula oxygen  Post-op Assessment: Report given to RN and Post -op Vital signs reviewed and stable  Post vital signs: Reviewed and stable  Last Vitals:  Vitals Value Taken Time  BP 96/71 09/29/24 17:30  Temp 37.2 C 09/29/24 17:27  Pulse 98 09/29/24 17:31  Resp 16 09/29/24 17:30  SpO2 97 % 09/29/24 17:31  Vitals shown include unfiled device data.  Last Pain:  Vitals:   09/29/24 1727  TempSrc:   PainSc: Asleep      Patients Stated Pain Goal: 0 (09/28/24 2042)  Complications: No notable events documented.

## 2024-09-29 NOTE — Progress Notes (Signed)
.. ° ° °  PROCEDURAL EXPEDITER PROGRESS NOTE  Patient Name: Thomas [name]  DOB:[birth year] Date of Admission: 09/28/2024  Date of Assessment:09/29/2024   -------------------------------------------------------------------------------------------------------------------   Brief clinical summary: Pt to OR today for right total hip replacement   Orders in place:  Yes   Labs, test, and orders reviewed: Y  Requires surgical clearance:  No  Barriers noted: Needs surgical PCR and a med rec done.    Intervention provided by [facility] team: Surgical PCR order placed. Reached out to Pharm tech to get the med rec completed.   Barrier resolved:  yes   -------------------------------------------------------------------------------------------------------------------  [employer], [place], [place] Please contact us  directly via secure chat (search for [facility]) or by calling us  at [phone] [facility]).

## 2024-09-29 NOTE — Consult Note (Signed)
 ORTHOPAEDIC CONSULTATION  REQUESTING PHYSICIAN: Thomas [name], *  Chief Complaint: Right hip pain  HPI: Thomas [name] is a 89 y.o. male who lost his balance and fell yesterday resulting in pain to the right hip.  X-ray showed a displaced femoral neck fracture.  He also has chronic pain in the right knee secondary to arthritis but was exacerbated by the fall.  He denies any pain on the left side.  He uses a cane at baseline for mobility for balance and due to the knee arthritis.  His wife passed away 6 months ago.  He lives alone and is independent.  Past Medical History:  Diagnosis Date   BPH (benign prostatic hypertrophy)    Chronic diastolic heart failure (HCC) 12/27/2013   Chronic kidney disease    Hx of bilateral renal cysts.   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 Wonda and Dr. Maryjane   Severe aortic stenosis    Sinus node dysfunction (HCC) 08/14/2014   Sleep apnea    Smoldering multiple myeloma    Past Surgical History:  Procedure Laterality Date   ADRENALECTOMY  02/12/11   Left, for Pheochromocytoma   CARDIOVERSION N/A 12/26/2013   Procedure: CARDIOVERSION;  Surgeon: Vina LULLA Gull, MD;  Location: [facility] ENDOSCOPY;  Service: Cardiovascular;  Laterality: N/A;   CHOLECYSTECTOMY  02/12/11   GASTRECTOMY  1984   S/P gastrectomy for ulcer   INTRAOPERATIVE TRANSTHORACIC ECHOCARDIOGRAM N/A 11/28/2023   Procedure:  INTRAOPERATIVE TRANSTHORACIC ECHOCARDIOGRAM;  Surgeon: Wonda Sharper, MD;  Location: [facility] INVASIVE CV LAB;  Service: Cardiovascular;  Laterality: N/A;   KNEE SURGERY  1971   S/P Right knee surgery   RIGHT/LEFT HEART CATH AND CORONARY ANGIOGRAPHY N/A 11/01/2023   Procedure: RIGHT/LEFT HEART CATH AND CORONARY ANGIOGRAPHY;  Surgeon: Wonda Sharper, MD;  Location: [facility] INVASIVE CV LAB;  Service: Cardiovascular;  Laterality: N/A;   TEE WITHOUT CARDIOVERSION N/A 12/26/2013   Procedure: TRANSESOPHAGEAL ECHOCARDIOGRAM (TEE);  Surgeon: Vina LULLA Gull, MD;  Location: [facility] ENDOSCOPY;  Service: Cardiovascular;  Laterality: N/A;   TRANSURETHRAL RESECTION OF PROSTATE  7/12   Social History   Socioeconomic History   Marital status: Married    Spouse name: Not on file   Number of children: 3   Years of education: Not on file   Highest education level: Not on file  Occupational History    Employer: RETIRED  Tobacco Use   Smoking status: Never   Smokeless tobacco: Never  Vaping Use   Vaping status: Never Used  Substance and Sexual Activity   Alcohol use: No   Drug use: No   Sexual activity: Not Currently  Other Topics Concern   Not on file  Social History Narrative   Not on file   Social Drivers of Health   Tobacco Use: Low Risk (04/24/2024)   Patient History    Smoking Tobacco Use: Never  Smokeless Tobacco Use: Never    Passive Exposure: Not on file  Financial Resource Strain: Not on file  Food Insecurity: No Food Insecurity (11/29/2023)   Hunger Vital Sign    Worried About Running Out of Food in the Last Year: Never true    Ran Out of Food in the Last Year: Never true  Transportation Needs: No Transportation Needs (11/29/2023)   PRAPARE - Administrator, Civil Service (Medical): No    Lack of Transportation (Non-Medical): No  Physical Activity: Not on file  Stress: Not on file  Social Connections: Moderately Isolated (11/29/2023)   Social Connection and Isolation Panel     Frequency of Communication with Friends and Family: Never    Frequency of Social Gatherings with Friends and Family: Never    Attends Religious Services: 1 to 4 times per year    Active Member of [employer] or Organizations: No    Attends Banker Meetings: Never    Marital Status: Married  Depression (PHQ2-9): Not on file  Alcohol Screen: Not on file  Housing: Low Risk (11/29/2023)   Housing Stability Vital Sign    Unable to Pay for Housing in the Last Year: No    Number of Times Moved in the Last Year: 0    Homeless in the Last Year: No  Utilities: Not At Risk (11/29/2023)   AHC Utilities    Threatened with loss of utilities: No  Health Literacy: Not on file   Family History  Problem Relation Age of Onset   Prostate cancer Father    Prostate cancer Brother    Polymyositis Mother    Liver disease Sister    Liver disease Sister    Liver disease Sister    Melanoma Brother    Kidney Stones Brother    Allergies[1]   Positive ROS: All other systems have been reviewed and were otherwise negative with the exception of those mentioned in the HPI and as above.  Physical Exam: General: Alert, no acute distress Cardiovascular: No pedal edema Respiratory: No cyanosis, no use of accessory musculature Skin: No lesions in the area of chief complaint Neurologic: Sensation intact distally Psychiatric: Patient is competent for consent with normal mood and affect  MUSCULOSKELETAL:  LLE No traumatic wounds, ecchymosis, or rash  Nontender  No groin pain with log roll  No knee or ankle effusion  Knee stable to varus/ valgus stress  Sens DPN, SPN, TN intact  Motor EHL, ext, flex 5/5  DP 2+, PT 2+, No significant edema   RLE No traumatic wounds, ecchymosis, or rash groin pain with log roll  Swelling and tenderness about the right knee  No ankle effusion  Sens DPN, SPN, TN intact  Motor EHL, ext, flex 5/5  DP 2+, PT 2+, No significant edema   IMAGING: X-rays reviewed  demonstrate displaced right femoral neck fracture   Assessment: Principal Problem:   Closed displaced fracture of right femoral neck (HCC)  Displaced right femoral neck fracture  Plan: Discussed plan for total hip arthroplasty for the right femoral neck fracture.  Timing for surgery possibly today with Dr. Ernie at [facility] versus tomorrow with myself at [facility] depending on OR availability.  He is on Eliquis  for atrial fibrillation.  Last dose was Saturday morning 12/27.  Continue to hold in anticipation of surgery.  Ordered CT scan of the right knee to eval for occult fracture given significant tenderness to palpation and moderate swelling.  May just be a  contusion with the underlying arthritis.    Thomas DELENA HIGASHI, MD  Contact information:   Tzzxijbd 7am-5pm epic message Dr. Higashi, or call office for patient follow up: [phone] After hours and holidays please check [email or web address] for group call information for [facility]    [1]  Allergies Allergen Reactions   Benadryl [Diphenhydramine Hcl] Other (See Comments)    Bad dreams -  Hallucinations.   Ceftriaxone Sodium Hives   Nsaids Other (See Comments)    REACTION: BLEEDING ULCER   Penicillins Hives   Farxiga  [Dapagliflozin ] Rash   Codeine     loopy   Doxycycline Nausea And Vomiting    Stomach pain   Ibuprofen Hives    Was told to stay off it by Dr. Claudene.   Levaquin [Levofloxacin]     Unknown reaction    Oxybutynin Other (See Comments)    Lightheaded, dizzy, unbalanced. Disturbed my equilibrium.   Tramadol Hcl     Other reaction(s): dizzy/nauseated   Chlorhexidine  Rash    Use alcohol for skin prep per patient request.    Furosemide  Diarrhea, Itching and Rash

## 2024-09-29 NOTE — Progress Notes (Signed)
" °  Progress Note   Patient: Thomas [name] FMW:[id number] DOB: January 18, 1935 DOA: 09/28/2024     1 DOS: the patient was seen and examined on 09/29/2024 seen at 7:30AM   Brief hospital course: 89 y.o. M with smoldering MM s/p Velcade  and Revlimid  in 2019, prosCA s/p radX, pheochromocytoma s/p resection in 2010, as well as AS s/p TAVR in 2025, permAF on Eliquis , dCHF, stage IIIb baseline 1.4 who presented with fall and right hip fracture.  Assessment and Plan: Right hip fracture - Consult Ortho - Hold Eliquis    Right knee swelling X-ray suggests possible loose bodies - CT knee  Permanent atrial fibrillation with RVR HRs in the 130s overnight due to pain, holding metoprolol  - Resume metoprolol , dose increased - IV Metoprolol  x1 now - Hold Eliquis   Severe aortic stenosis status post TAVR last February I have reached out to Cardiology  Chronic diastolic congestive heart failure Appears euvolemic - Hold torsemide today  Stage IIIb chronic kidney disease Cr stable at baseline - Attention post-op   History of multiple myeloma In surveillance with Dr. Gatha, no active treatment  History of prostate cancer, resolved History of pheochromocytoma, resolved  Normocytic anemia Mild - Attention postop      Subjective: Patient is feeling okay, he has no chest pain, dyspnea, orthopnea, swelling.  He only complains of right leg and knee pain.  Inability to get comfortable.  Physical Exam: Vitals:   09/29/24 0345 09/29/24 0400 09/29/24 0500 09/29/24 0530  BP: (!) 151/85 137/89 (!) 154/105 (!) 149/97  Pulse: (!) 105 96 (!) 105 (!) 108  Resp: (!) 23 (!) 21 (!) 36 (!) 27  Temp:      TempSrc:      SpO2: 95% 97% 97% 95%  Weight:      Height:       Overweight adult male, lying in bed, interactive and appropriate Tachycardic, irregular, no murmurs, no peripheral edema, JVP not visible due to body habitus Respiratory rate seems normal, lungs clear without rales or  wheezes Abdomen soft, there is some tenderness in the right lower quadrant, referred pain from the hip, otherwise no guarding Attention normal, oriented x 3, face symmetric, speech fluent, upper extremity strength 5/5 and symmetric   Data Reviewed: Discussed with orthopedics and cardiology Basic metabolic panel shows normal electrolytes and renal function LFTs normal CBC shows mild anemia    Family Communication: None present  Disposition: Status is: Inpatient   Planned Discharge Destination:       Author: Lonni SHAUNNA Dalton, MD 09/29/2024 6:03 AM  For on call review [email or web address].   "

## 2024-09-29 NOTE — Progress Notes (Signed)
 Orthopedic Tech Progress Note Patient Details:  Thomas [name] Nov 19, 1934 [id number]  Patient ID: Thomas [name], male   DOB: [birth year], 89 y.o.   MRN: [id number] Pt doesn't meet criteria for ohf. Pt must be under 70 to get ohf. Chandra Dorn JINNY 09/29/2024, 7:54 PM

## 2024-09-29 NOTE — Op Note (Signed)
 MEDICAL RECORD NO.: [id number]      FACILITY:  [facility]      PHYSICIAN:  Thomas JONETTA [name]  DATE OF BIRTH:  04/08/1935     DATE OF PROCEDURE:  09/29/2024                                 OPERATIVE REPORT         PREOPERATIVE DIAGNOSIS: Right  hip femoral neck fracture.      POSTOPERATIVE DIAGNOSIS:  Right hip femoral neck fracture     PROCEDURE:  Right total hip replacement through an anterior approach   utilizing DePuy THR system, component size 54 mm pinnacle cup, a size 36+4 neutral   Altrex liner, a size 5 Hi Actis stem with a 36+5 Articuleze metal head ball.      SURGEON:  Thomas [name], M.D.      ASSISTANT:  Rosina Calin, PA-C     ANESTHESIA:  Regional.      SPECIMENS:  None.      COMPLICATIONS:  None.      BLOOD LOSS:  500 cc     DRAINS:  None.      INDICATION OF THE PROCEDURE:  Thomas [name] is a 89 y.o. male who unfortunately had a trip and fall in his home.  He uses a cane due to right knee osteoarthritis.  He landed on his right side and had the immediate onset of right hip pain and inability to bear weight.  He is brought to the emergency room where radiographs revealed a displaced right femoral neck fracture.  Treatment of his hip fracture was discussed as being a total hip replacement.  Risks include infection DVT dislocation neurovascular injury and the need for future surgeries.  Consent was obtained for management of his fracture care and pain relief.     PROCEDURE IN DETAIL:  The patient was brought to operative theater.   Once adequate anesthesia, preoperative antibiotics, 2 gm of Ancef , 1 gm of Tranexamic Acid , and 10 mg of Decadron  were administered, the patient was positioned supine on the [employer] table.  Once the patient was safely positioned with adequate padding of boney prominences we predraped out the hip, and used fluoroscopy to confirm orientation of the pelvis.      The right hip was then prepped and draped from proximal  iliac crest to   mid thigh with a shower curtain technique.      Time-out was performed identifying the patient, planned procedure, and the appropriate extremity.     An incision was then made 2 cm lateral to the   anterior superior iliac spine extending over the orientation of the   tensor fascia lata muscle and sharp dissection was carried down to the   fascia of the muscle.      The fascia was then incised.  The muscle belly was identified and swept   laterally and retractor placed along the superior neck.  Following   cauterization of the circumflex vessels and removing some pericapsular   fat, a second cobra retractor was placed on the inferior neck.  A T-capsulotomy was made along the line of the   superior neck to the trochanteric fossa, then extended proximally and   distally.  Tag sutures were placed and the retractors were then placed   intracapsular.  We then identified the trochanteric fossa and   orientation of my  neck cut and then made a neck osteotomy with the femur on traction.  The fractured femoral neck and femoral head were removed without difficulty or complication.  Traction was let   off and retractors were placed posterior and anterior around the   acetabulum.      The labrum and foveal tissue were debrided.  I began reaming with a 47 mm   reamer and reamed up to 53 mm reamer with good bony bed preparation and a 54 mm  cup was chosen.  The final 54 mm Pinnacle cup was then impacted under fluoroscopy to confirm the depth of penetration and orientation with respect to   Abduction and forward flexion.  A screw was placed into the ilium followed by the hole eliminator.  The final   36+4 neutral Altrex liner was impacted with good visualized rim fit.  The cup was positioned anatomically within the acetabular portion of the pelvis.      At this point, the femur was rolled to 100 degrees.  Further capsule was   released off the inferior aspect of the femoral neck.  I then    released the superior capsule proximally.  With the leg in a neutral position the hook was placed laterally   along the femur under the vastus lateralis origin and elevated manually and then held in position using the hook attachment on the bed.  The leg was then extended and adducted with the leg rolled to 100   degrees of external rotation.  Retractors were placed along the medial calcar and posteriorly over the greater trochanter.  Once the proximal femur was fully   exposed, I used a box osteotome to set orientation.  I then began   broaching with the starting chili pepper broach and passed this by hand and then broached up to 5.  With the 5 broach in place I chose a high offset neck and did several trial reductions.  The offset was appropriate, leg lengths   appeared to be equal best matched with the +5 head ball trial confirmed radiographically.   Given these findings, I went ahead and dislocated the hip, repositioned all   retractors and positioned the right hip in the extended and abducted position.  The final 5 Hi Actis stem was   chosen and it was impacted down to the level of neck cut.  Based on this   and the trial reductions, a final 36+5 Articuleze metal head ball was chosen and   impacted onto a clean and dry trunnion, and the hip was reduced.  The   hip had been irrigated throughout the case again at this point.  I did   reapproximate the superior capsular leaflet to the anterior leaflet   using #1 Vicryl.  The fascia of the   tensor fascia lata muscle was then reapproximated using #1 Vicryl and #0 Stratafix sutures.  The   remaining wound was closed with 2-0 Vicryl and running 4-0 Monocryl.   The hip was cleaned, dried, and dressed sterilely using Dermabond and   Aquacel dressing.  The patient was then brought   to recovery room in stable condition tolerating the procedure well.    Rosina Calin, PA-C was present for the entirety of the case involved from   preoperative  positioning, perioperative retractor management, general   facilitation of the case, as well as primary wound closure as assistant.            Thomas [name], M.D.  09/29/2024 3:08 PM

## 2024-09-29 NOTE — Anesthesia Postprocedure Evaluation (Signed)
"   Anesthesia Post Note  Patient: Thomas [name]  Procedure(s) Performed: ARTHROPLASTY, HIP, TOTAL, ANTERIOR APPROACH (Right: Hip)     Patient location during evaluation: PACU Anesthesia Type: General Level of consciousness: awake and alert Pain management: pain level controlled Vital Signs Assessment: post-procedure vital signs reviewed and stable Respiratory status: spontaneous breathing, nonlabored ventilation, respiratory function stable and patient connected to nasal cannula oxygen Cardiovascular status: blood pressure returned to baseline and stable Postop Assessment: no apparent nausea or vomiting Anesthetic complications: no   No notable events documented.  Last Vitals:  Vitals:   09/29/24 1800 09/29/24 1827  BP: 110/69 122/73  Pulse: (!) 106 95  Resp: 11   Temp: 37.2 C 36.6 C  SpO2: 98% 100%    Last Pain:  Vitals:   09/29/24 1827  TempSrc: Oral  PainSc:                  Thomas [name]      "

## 2024-09-29 NOTE — Anesthesia Preprocedure Evaluation (Signed)
"                                    Anesthesia Evaluation  Patient identified by MRN, date of birth, ID band Patient awake    Reviewed: Allergy & Precautions, NPO status , Patient's Chart, lab work & pertinent test results  Airway Mallampati: II  TM Distance: >3 FB Neck ROM: Full    Dental   Pulmonary sleep apnea and Continuous Positive Airway Pressure Ventilation    Pulmonary exam normal        Cardiovascular hypertension, Pt. on medications and Pt. on home beta blockers Normal cardiovascular exam+ dysrhythmias Atrial Fibrillation + Valvular Problems/Murmurs AI and MR   IMPRESSIONS     1. Left ventricular ejection fraction, by estimation, is 60 to 65%. Left  ventricular ejection fraction by 3D volume is 63 %. The left ventricle has  normal function. The left ventricle has no regional wall motion  abnormalities. Left ventricular diastolic   parameters are indeterminate. Elevated left ventricular end-diastolic  pressure.   2. Right ventricular systolic function is normal. The right ventricular  size is normal.   3. Left atrial size was severely dilated.   4. The mitral valve is degenerative. Mild to moderate mitral valve  regurgitation. No evidence of mitral stenosis.   5. The aortic valve has been repaired/replaced. There is moderate  perivalvular Aortic valve regurgitation. No aortic stenosis is present.  There is a 29 mm Sapien prosthetic (TAVR) valve present in the aortic  position. Aortic regurgitation PHT measures  372 msec. Aortic valve area, by VTI measures 2.59 cm. Aortic valve mean  gradient measures 3.6 mmHg. Aortic valve Vmax measures 1.24 m/s.   6. There is mild dilatation of the ascending aorta, measuring 43 mm.   7. The inferior vena cava is normal in size with greater than 50%  respiratory variability, suggesting right atrial pressure of 3 mmHg.   8. Compared to echo dated 09/08/23, there is now a normal functioning TAVR  with moderate perivalvular  AI present.      Neuro/Psych  Neuromuscular disease    GI/Hepatic negative GI ROS, Neg liver ROS,,,  Endo/Other  Hypothyroidism    Renal/GU CRFRenal disease     Musculoskeletal  (+) Arthritis ,    Abdominal   Peds  Hematology  (+) Blood dyscrasia (last eliquis  12/27), anemia   Anesthesia Other Findings   Reproductive/Obstetrics                              Anesthesia Physical Anesthesia Plan  ASA: 3  Anesthesia Plan: General   Post-op Pain Management: Ofirmev  IV (intra-op)*   Induction: Intravenous  PONV Risk Score and Plan: 2 and Dexamethasone , Ondansetron  and Treatment may vary due to age or medical condition  Airway Management Planned: Oral ETT  Additional Equipment:   Intra-op Plan:   Post-operative Plan: Extubation in OR  Informed Consent: I have reviewed the patients History and Physical, chart, labs and discussed the procedure including the risks, benefits and alternatives for the proposed anesthesia with the patient or authorized representative who has indicated his/her understanding and acceptance.     Dental advisory given  Plan Discussed with: CRNA  Anesthesia Plan Comments:         Anesthesia Quick Evaluation  "

## 2024-09-29 NOTE — Anesthesia Procedure Notes (Signed)
 Procedure Name: Intubation Date/Time: 09/29/2024 3:52 PM  Performed by: Lockie Flesher, CRNAPre-anesthesia Checklist: Patient identified, Emergency Drugs available, Suction available and Patient being monitored Patient Re-evaluated:Patient Re-evaluated prior to induction Oxygen Delivery Method: Circle System Utilized Preoxygenation: Pre-oxygenation with 100% oxygen Induction Type: IV induction Ventilation: Mask ventilation without difficulty Laryngoscope Size: Mac and 4 Grade View: Grade I Tube type: Oral Tube size: 7.0 mm Number of attempts: 1 Airway Equipment and Method: Stylet and Oral airway Placement Confirmation: ETT inserted through vocal cords under direct vision, positive ETCO2 and breath sounds checked- equal and bilateral Secured at: 23 cm Tube secured with: Tape Dental Injury: Teeth and Oropharynx as per pre-operative assessment

## 2024-09-29 NOTE — Hospital Course (Signed)
 Brief Narrative:   89 y.o. M with smoldering MM s/p Velcade  and Revlimid  in 2019, prosCA s/p radX, pheochromocytoma s/p resection in 2010, as well as AS s/p TAVR in 2025, permAF on Eliquis , dCHF, stage IIIb baseline 1.4 who presented with fall and right hip fracture.  Patient underwent ORIF and did well.  PT OT recommended SNF but family opted for home health services.  Also had right knee swelling for which it was aspirated and showed hemarthrosis.  Orthopedic recommended discharging on Medrol Dosepak.    Assessment & Plan:  Right hip fracture -Status post ORIF.  Eliquis  resumed.  PT recommended SNF but family elected HH   Right knee swelling -Status post aspiration this evening showing some hemarthrosis.  6 days of prednisone pack per orthopedic   Permanent atrial fibrillation with RVR -On Toprol -XL and Eliquis    Severe aortic stenosis status post TAVR last February   Chronic diastolic congestive heart failure Appears euvolemic -Resume home meds upon discharge   Stage IIIb chronic kidney disease Cr stable at baseline   History of multiple myeloma In surveillance with Dr. Gatha, no active treatment   History of prostate cancer, resolved History of pheochromocytoma, resolved   Normocytic anemia Mild - Attention postop   DVT prophylaxis: Eliquis      Code Status: Full Code Family Communication:   Status is: Inpatient Remains inpatient appropriate because: Plans to hopefully discharge home today   PT Follow up Recs: Skilled Nursing-Short Term Rehab (<3 Hours/Day)10/01/2024 1037  Subjective:  Seen at bedside no complaints Family present  Examination:  General exam: Appears calm and comfortable  Respiratory system: Clear to auscultation. Respiratory effort normal. Cardiovascular system: S1 & S2 heard, RRR. No JVD, murmurs, rubs, gallops or clicks. No pedal edema. Gastrointestinal system: Abdomen is nondistended, soft and nontender. No organomegaly or masses felt.  Normal bowel sounds heard. Central nervous system: Alert and oriented. No focal neurological deficits. Extremities: Symmetric 5 x 5 power..  Mild right sided knee swelling Skin: No rashes, lesions or ulcers Psychiatry: Judgement and insight appear normal. Mood & affect appropriate.

## 2024-09-30 ENCOUNTER — Encounter (HOSPITAL_COMMUNITY): Payer: Self-pay | Admitting: Orthopedic Surgery

## 2024-09-30 DIAGNOSIS — W19XXXA Unspecified fall, initial encounter: Secondary | ICD-10-CM

## 2024-09-30 DIAGNOSIS — S72001A Fracture of unspecified part of neck of right femur, initial encounter for closed fracture: Secondary | ICD-10-CM | POA: Diagnosis not present

## 2024-09-30 LAB — CBC
HCT: 25.2 % — ABNORMAL LOW (ref 39.0–52.0)
Hemoglobin: 8.2 g/dL — ABNORMAL LOW (ref 13.0–17.0)
MCH: 29.3 pg (ref 26.0–34.0)
MCHC: 32.5 g/dL (ref 30.0–36.0)
MCV: 90 fL (ref 80.0–100.0)
Platelets: 113 K/uL — ABNORMAL LOW (ref 150–400)
RBC: 2.8 MIL/uL — ABNORMAL LOW (ref 4.22–5.81)
RDW: 13.9 % (ref 11.5–15.5)
WBC: 14 K/uL — ABNORMAL HIGH (ref 4.0–10.5)
nRBC: 0 % (ref 0.0–0.2)

## 2024-09-30 LAB — COMPREHENSIVE METABOLIC PANEL WITH GFR
ALT: 14 U/L (ref 0–44)
AST: 27 U/L (ref 15–41)
Albumin: 3.4 g/dL — ABNORMAL LOW (ref 3.5–5.0)
Alkaline Phosphatase: 78 U/L (ref 38–126)
Anion gap: 8 (ref 5–15)
BUN: 24 mg/dL — ABNORMAL HIGH (ref 8–23)
CO2: 21 mmol/L — ABNORMAL LOW (ref 22–32)
Calcium: 8.7 mg/dL — ABNORMAL LOW (ref 8.9–10.3)
Chloride: 107 mmol/L (ref 98–111)
Creatinine, Ser: 1.52 mg/dL — ABNORMAL HIGH (ref 0.61–1.24)
GFR, Estimated: 44 mL/min — ABNORMAL LOW
Glucose, Bld: 149 mg/dL — ABNORMAL HIGH (ref 70–99)
Potassium: 4.8 mmol/L (ref 3.5–5.1)
Sodium: 136 mmol/L (ref 135–145)
Total Bilirubin: 0.5 mg/dL (ref 0.0–1.2)
Total Protein: 5.7 g/dL — ABNORMAL LOW (ref 6.5–8.1)

## 2024-09-30 MED ORDER — PROPOFOL 10 MG/ML IV BOLUS
INTRAVENOUS | Status: AC
  Filled 2024-09-30: qty 20

## 2024-09-30 NOTE — Progress Notes (Signed)
 "  Subjective: 1 Day Post-Op Procedures (LRB): ARTHROPLASTY, HIP, TOTAL, ANTERIOR APPROACH (Right) Patient reports pain as mild.   Patient seen in rounds for Dr. Ernie. Patient is resting in bed on exam this morning. No acute events overnight. Foley catheter removed. Patient has not been up with PT yet. He reports his daughter lives nearby and he will try to arrange with her for discharge plans.  We will start therapy today.   Objective: Vital signs in last 24 hours: Temp:  [97.8 F (36.6 C)-99 F (37.2 C)] 97.8 F (36.6 C) (12/29 0804) Pulse Rate:  [76-110] 110 (12/29 0804) Resp:  [10-16] 16 (12/29 0804) BP: (96-122)/(59-75) 98/59 (12/29 0804) SpO2:  [97 %-100 %] 99 % (12/29 0804)  Intake/Output from previous day:  Intake/Output Summary (Last 24 hours) at 09/30/2024 1606 Last data filed at 09/30/2024 1602 Gross per 24 hour  Intake 2182.92 ml  Output 1250 ml  Net 932.92 ml     Intake/Output this shift: Total I/O In: 992.9 [P.O.:240; I.V.:752.9] Out: 200 [Urine:200]  Labs: Recent Labs    09/28/24 1929 09/28/24 2242 09/29/24 0640 09/30/24 0752  HGB 10.7* 10.3* 10.2* 8.2*   Recent Labs    09/29/24 0640 09/30/24 0752  WBC 10.3 14.0*  RBC 3.42* 2.80*  HCT 31.5* 25.2*  PLT 147* 113*   Recent Labs    09/29/24 0640 09/30/24 0316  NA 137 136  K 4.6 4.8  CL 109 107  CO2 20* 21*  BUN 22 24*  CREATININE 1.43* 1.52*  GLUCOSE 122* 149*  CALCIUM  9.2 8.7*   Recent Labs    09/28/24 1929  INR 1.2    Exam: General - Patient is Alert and Appropriate Extremity - Neurologically intact Sensation intact distally Intact pulses distally Dorsiflexion/Plantar flexion intact Dressing - dressing C/D/I Motor Function - intact, moving foot and toes well on exam.   Past Medical History:  Diagnosis Date   BPH (benign prostatic hypertrophy)    Chronic diastolic heart failure (HCC) 12/27/2013   Chronic kidney disease    Hx of bilateral renal cysts.   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 Wonda and Dr. Maryjane   Severe aortic stenosis    Sinus node dysfunction (HCC) 08/14/2014   Sleep apnea    Smoldering multiple myeloma     Assessment/Plan: 1 Day Post-Op Procedures (LRB): ARTHROPLASTY, HIP, TOTAL, ANTERIOR APPROACH (Right) Principal Problem:   Closed displaced fracture of right femoral neck (HCC) Active Problems:   S/P total right hip arthroplasty  Estimated body mass index is 27.97 kg/m as calculated from the following:   Height as of this encounter: 5' 4 (1.626 m).   Weight as of this encounter: 73.9 kg. Advance diet Up with therapy  DVT Prophylaxis - Eliquis  Weight bearing as tolerated.  Hgb stable at 8.2 this AM Slightly hypotensive  Up with PT Planning to  d/c home with help of his daughter/grandson   Rosina Calin, [place] [facility] [phone] 09/30/2024, 4:06 PM  "

## 2024-09-30 NOTE — Evaluation (Addendum)
 Physical Therapy Evaluation Patient Details Name: Thomas [name] MRN: [id number] DOB: [birth year] Today's Date: 09/30/2024  History of Present Illness  Pt is a 89 y.o. M who presents 09/28/2024 with a fall and displaced R femoral neck fx. S/p right total hip replacement through anterior approach. Significant PMH:  permanent atrial fibrillation on Eliquis , chronic HFpEF, severe aortic stenosis status post TAVR, OSA on CPAP, hypertension, hyperlipidemia, CKD 3B.  Clinical Impression  PTA, pt lives alone in a one story home with 1 step to enter and is independent with ADL's and ambulation using a cane. Pt presents with right hip/knee pain, RLE weakness, gait abnormalities, impaired standing balance. Pt requiring min-mod assist (+2 safety) for bed mobility, transfers and ambulating ~5 ft with RW and chair follow. Pt with limited tolerance for weightbearing through RLE. Ice applied post session. Patient will benefit from continued inpatient follow up therapy, <3 hours/day in order to address deficits and maximize functional mobility. Will continue to reassess if pt has significant improvement.        If plan is discharge home, recommend the following: A little help with walking and/or transfers;A little help with bathing/dressing/bathroom;Assistance with cooking/housework;Assist for transportation;Help with stairs or ramp for entrance   Can travel by private vehicle   Yes    Equipment Recommendations None recommended by PT (pt equipped)  Recommendations for Other Services       Functional Status Assessment Patient has had a recent decline in their functional status and demonstrates the ability to make significant improvements in function in a reasonable and predictable amount of time.     Precautions / Restrictions Precautions Precautions: Fall Restrictions Weight Bearing Restrictions Per Provider Order: No      Mobility  Bed Mobility Overal bed mobility: Needs Assistance Bed  Mobility: Supine to Sit     Supine to sit: Mod assist, +2 for physical assistance     General bed mobility comments: RLE management off edge of bed, pt using bed rail, difficulty with shifting hips and trunk assist to sit up    Transfers Overall transfer level: Needs assistance Equipment used: Rolling walker (2 wheels) Transfers: Sit to/from Stand Sit to Stand: Min assist, +2 safety/equipment           General transfer comment: Rocking to gain momentum, cues for hand placement, minA to power up to stand. Increased time to achieve upright    Ambulation/Gait Ambulation/Gait assistance: Min assist, +2 safety/equipment Gait Distance (Feet): 5 Feet Assistive device: Rolling walker (2 wheels) Gait Pattern/deviations: Step-to pattern, Decreased stance time - right, Decreased weight shift to right, Trunk flexed Gait velocity: decreased Gait velocity interpretation: <1.31 ft/sec, indicative of household ambulator   General Gait Details: Pt with minimal weightbearing through R foot, hopping through L foot, cues for sequencing/technique and to keep R foot flat if able. Chair follow utilized  Acupuncturist Bed    Modified Rankin (Stroke Patients Only)       Balance Overall balance assessment: Needs assistance Sitting-balance support: Feet supported Sitting balance-Leahy Scale: Fair     Standing balance support: Bilateral upper extremity supported, Reliant on assistive device for balance Standing balance-Leahy Scale: Poor                               Pertinent Vitals/Pain Pain Assessment Pain Assessment: Faces Faces Pain Scale: Hurts whole lot  Pain Location: R hip, knee (arthritic at baseline) Pain Descriptors / Indicators: Discomfort, Grimacing, Operative site guarding Pain Intervention(s): Limited activity within patient's tolerance, Monitored during session, Premedicated before session, Ice applied    Home Living  Family/patient expects to be discharged to:: Private residence Living Arrangements: Alone Available Help at Discharge: Family;Available PRN/intermittently (daughter lives 2 miles away, grandson 79 y.o., FIL also available) Type of Home: House Home Access: Stairs to enter Entrance Stairs-Rails: None Entrance Stairs-Number of Steps: 1   Home Layout: One level Home Equipment: Agricultural Consultant (2 wheels);Rollator (4 wheels);Cane - single point;Shower seat;BSC/3in1 Additional Comments: Daughter available to stay overnight initially as needed    Prior Function Prior Level of Function : Independent/Modified Independent;Driving             Mobility Comments: Uses cane, reports 2 falls within past 3 months ADLs Comments: Independent ADL's/IADL's     Extremity/Trunk Assessment   Upper Extremity Assessment Upper Extremity Assessment: Defer to OT evaluation    Lower Extremity Assessment Lower Extremity Assessment: RLE deficits/detail RLE Deficits / Details: R femoral neck fx s/p THA. Grossly weak 2-/5    Cervical / Trunk Assessment Cervical / Trunk Assessment: Kyphotic  Communication   Communication Communication: Impaired Factors Affecting Communication: Hearing impaired    Cognition Arousal: Alert Behavior During Therapy: WFL for tasks assessed/performed   PT - Cognitive impairments: No apparent impairments                         Following commands: Intact       Cueing Cueing Techniques: Verbal cues     General Comments      Exercises General Exercises - Lower Extremity Long Arc Quad: AAROM, Right, 5 reps, Seated   Assessment/Plan    PT Assessment Patient needs continued PT services  PT Problem List Decreased strength;Decreased activity tolerance;Decreased balance;Decreased mobility;Pain       PT Treatment Interventions DME instruction;Gait training;Stair training;Functional mobility training;Therapeutic activities;Therapeutic exercise;Balance  training;Patient/family education    PT Goals (Current goals can be found in the Care Plan section)  Acute Rehab PT Goals Patient Stated Goal: to return to independence PT Goal Formulation: With patient/family Time For Goal Achievement: 10/14/24 Potential to Achieve Goals: Good    Frequency Min 3X/week     Co-evaluation               AM-PAC PT 6 Clicks Mobility  Outcome Measure Help needed turning from your back to your side while in a flat bed without using bedrails?: A Little Help needed moving from lying on your back to sitting on the side of a flat bed without using bedrails?: A Lot Help needed moving to and from a bed to a chair (including a wheelchair)?: A Little Help needed standing up from a chair using your arms (e.g., wheelchair or bedside chair)?: A Little Help needed to walk in hospital room?: Total Help needed climbing 3-5 steps with a railing? : Total 6 Click Score: 13    End of Session Equipment Utilized During Treatment: Gait belt Activity Tolerance: Patient tolerated treatment well Patient left: in chair;with call bell/phone within reach;with chair alarm set Nurse Communication: Mobility status PT Visit Diagnosis: Unsteadiness on feet (R26.81);Other abnormalities of gait and mobility (R26.89);History of falling (Z91.81);Difficulty in walking, not elsewhere classified (R26.2);Pain Pain - Right/Left: Right Pain - part of body: Hip    Time: 9184-9151 PT Time Calculation (min) (ACUTE ONLY): 33 min   Charges:   PT Evaluation $  PT Eval Low Complexity: 1 Low PT Treatments $Therapeutic Activity: 8-22 mins PT General Charges $$ ACUTE PT VISIT: 1 Visit         Thomas [name], PT, DPT Acute Rehabilitation Services Office [phone]   Thomas [name] 09/30/2024, 8:59 AM

## 2024-09-30 NOTE — Evaluation (Signed)
 Occupational Therapy Evaluation Patient Details Name: Thomas [name] MRN: [id number] DOB: [birth year] Today's Date: 09/30/2024   History of Present Illness   Pt is a 89 y.o. M who presents 09/28/2024 with a fall and displaced R femoral neck fx. S/p right total hip replacement through anterior approach. Significant PMH:  permanent atrial fibrillation on Eliquis , chronic HFpEF, severe aortic stenosis status post TAVR, OSA on CPAP, hypertension, hyperlipidemia, CKD 3B.     Clinical Impressions PTA Pt was independent with ADL/IADLs and Mod I with cane for functional mobility. Pt currently requires up to Mod A for functional transfers and up to Mod A for ADL engagement. Pt is primarily limited by decreased activity tolerance, unsteadiness on feet, decreased knowledge of DME, decreased safety awareness, and pain. OT to continue to follow Pt acutely to facilitate progress towards goals. Patient will benefit from continued inpatient follow up therapy, <3 hours/day with goal of safe return home.      If plan is discharge home, recommend the following:   A lot of help with walking and/or transfers;A lot of help with bathing/dressing/bathroom;Assist for transportation;Help with stairs or ramp for entrance;Assistance with cooking/housework     Functional Status Assessment   Patient has had a recent decline in their functional status and demonstrates the ability to make significant improvements in function in a reasonable and predictable amount of time.     Equipment Recommendations   Other (comment) (defer to next venue)     Recommendations for Other Services         Precautions/Restrictions   Precautions Precautions: Fall Recall of Precautions/Restrictions: Intact Restrictions Weight Bearing Restrictions Per Provider Order: No     Mobility Bed Mobility               General bed mobility comments: Pt greeted in recliner and returned to recliner     Transfers Overall transfer level: Needs assistance Equipment used: Rolling walker (2 wheels) Transfers: Sit to/from Stand Sit to Stand: Mod assist           General transfer comment: Mod A to stand from recliner. Verbal cues for proper hand placement on RW. Min A in standing to ambulate ~6 feet. Dense verbal cues for controlled descent back to recliner.      Balance Overall balance assessment: Needs assistance Sitting-balance support: Feet supported Sitting balance-Leahy Scale: Fair     Standing balance support: Bilateral upper extremity supported, During functional activity, Reliant on assistive device for balance Standing balance-Leahy Scale: Poor Standing balance comment: Dependent on RW and external support                           ADL either performed or assessed with clinical judgement   ADL Overall ADL's : Needs assistance/impaired Eating/Feeding: Set up;Sitting   Grooming: Set up;Sitting   Upper Body Bathing: Contact guard assist;Sitting   Lower Body Bathing: Moderate assistance   Upper Body Dressing : Contact guard assist   Lower Body Dressing: Moderate assistance   Toilet Transfer: Minimal assistance;Moderate assistance;Stand-pivot;BSC/3in1;Rolling walker (2 wheels)   Toileting- Clothing Manipulation and Hygiene: Moderate assistance               Vision Baseline Vision/History: 1 Wears glasses Patient Visual Report: No change from baseline       Perception         Praxis         Pertinent Vitals/Pain Pain Assessment Pain Assessment: Faces Faces Pain Scale: Hurts whole  lot Pain Location: R hip radiating to knee Pain Descriptors / Indicators: Discomfort, Grimacing, Guarding, Sharp Pain Intervention(s): Limited activity within patient's tolerance, Monitored during session, Patient requesting pain meds-RN notified     Extremity/Trunk Assessment Upper Extremity Assessment Upper Extremity Assessment: Overall WFL for tasks  assessed   Lower Extremity Assessment Lower Extremity Assessment: Defer to PT evaluation   Cervical / Trunk Assessment Cervical / Trunk Assessment: Kyphotic   Communication Communication Communication: Impaired Factors Affecting Communication: Hearing impaired   Cognition Arousal: Alert Behavior During Therapy: WFL for tasks assessed/performed Cognition: No apparent impairments                               Following commands: Intact       Cueing  General Comments   Cueing Techniques: Verbal cues;Visual cues  VSS on RA.   Exercises     Shoulder Instructions      Home Living Family/patient expects to be discharged to:: Private residence Living Arrangements: Alone Available Help at Discharge: Family;Available PRN/intermittently (daughter lives 2 miles away, grandson 48 y.o., FIL also available) Type of Home: House Home Access: Stairs to enter Secretary/administrator of Steps: 1 Entrance Stairs-Rails: None Home Layout: One level     Bathroom Shower/Tub: Walk-in shower (threshold)         Home Equipment: Agricultural Consultant (2 wheels);Rollator (4 wheels);Cane - single point;Shower seat;BSC/3in1   Additional Comments: Daughter available to stay overnight initially as needed      Prior Functioning/Environment Prior Level of Function : Independent/Modified Independent;Driving             Mobility Comments: Uses cane, reports 2 falls within past 3 months ADLs Comments: Independent ADL's/IADL's    OT Problem List: Decreased activity tolerance;Impaired balance (sitting and/or standing);Decreased safety awareness;Decreased knowledge of use of DME or AE;Pain   OT Treatment/Interventions: Self-care/ADL training;Therapeutic exercise;Energy conservation;DME and/or AE instruction;Therapeutic activities;Patient/family education;Balance training      OT Goals(Current goals can be found in the care plan section)   Acute Rehab OT Goals Patient Stated Goal:  to get better OT Goal Formulation: With patient Time For Goal Achievement: 10/14/24 Potential to Achieve Goals: Good ADL Goals Pt Will Perform Lower Body Bathing: with modified independence;with adaptive equipment Pt Will Perform Lower Body Dressing: with min assist;with adaptive equipment;sitting/lateral leans Pt Will Transfer to Toilet: with contact guard assist;ambulating;bedside commode Pt Will Perform Toileting - Clothing Manipulation and hygiene: with contact guard assist;sitting/lateral leans   OT Frequency:  Min 2X/week    Co-evaluation              AM-PAC OT 6 Clicks Daily Activity     Outcome Measure Help from another person eating meals?: A Little Help from another person taking care of personal grooming?: A Little Help from another person toileting, which includes using toliet, bedpan, or urinal?: A Lot Help from another person bathing (including washing, rinsing, drying)?: A Lot Help from another person to put on and taking off regular upper body clothing?: A Little Help from another person to put on and taking off regular lower body clothing?: A Lot 6 Click Score: 15   End of Session Equipment Utilized During Treatment: Gait belt;Rolling walker (2 wheels) Nurse Communication: Mobility status;Patient requests pain meds  Activity Tolerance: Patient tolerated treatment well Patient left: in chair;with call bell/phone within reach;with chair alarm set;with family/visitor present  OT Visit Diagnosis: Unsteadiness on feet (R26.81);Muscle weakness (generalized) (M62.81);History of falling (Z91.81);Pain  Pain - Right/Left: Right Pain - part of body: Hip                Time: 8546-8487 OT Time Calculation (min): 19 min Charges:  OT General Charges $OT Visit: 1 Visit OT Evaluation $OT Eval Low Complexity: 1 Low  Maurilio CROME, OTR/L.  [facility] Acute Rehabilitation  Office: [phone]   Maurilio PARAS Cyndi Montejano 09/30/2024, 3:22 PM

## 2024-09-30 NOTE — TOC CAGE-AID Note (Signed)
 Transition of Care [facility]) - CAGE-AID Screening   Patient Details  Name: Thomas [name] MRN: [id number] Date of Birth: 1935-05-21  Transition of Care [facility]) CM/SW Contact:    Carmelita FORBES Carbon, LCSW Phone Number: 09/30/2024, 10:54 AM   Clinical Narrative: No SA noted.   CAGE-AID Screening:    Have You Ever Felt You Ought to Cut Down on Your Drinking or Drug Use?: No Have People Annoyed You By Critizing Your Drinking Or Drug Use?: No Have You Felt Bad Or Guilty About Your Drinking Or Drug Use?: No Have You Ever Had a Drink or Used Drugs First Thing In The Morning to Steady Your Nerves or to Get Rid of a Hangover?: No CAGE-AID Score: 0  Substance Abuse Education Offered: No

## 2024-09-30 NOTE — Progress Notes (Signed)
" °  Progress Note   Patient: Thomas [name] FMW:[id number] DOB: [birth year] DOA: 09/28/2024     2 DOS: the patient was seen and examined on 09/30/2024   Brief hospital course: 89 y.o. M with smoldering MM s/p Velcade  and Revlimid  in 2019, prosCA s/p radX, pheochromocytoma s/p resection in 2010, as well as AS s/p TAVR in 2025, permAF on Eliquis , dCHF, stage IIIb baseline 1.4 who presented with fall and right hip fracture.  Assessment and Plan: Right hip fracture - Ortho following. Pt is now s/p surgery 12/28 -eliquis  resumed -PT/OT following, recs for SNF noted   Right knee swelling X-ray suggests possible loose bodies - CT knee   Permanent atrial fibrillation with RVR - Held Eliquis  for surgery, now resumed -bp soft at this time. Placed hold parameters for metoprolol    Severe aortic stenosis status post TAVR last February   Chronic diastolic congestive heart failure Appears euvolemic -cont to hold torsemide for now given soft bp this AM   Stage IIIb chronic kidney disease Cr stable at baseline   History of multiple myeloma In surveillance with Dr. Gatha, no active treatment   History of prostate cancer, resolved History of pheochromocytoma, resolved   Normocytic anemia Mild - Attention postop      Subjective: Tolerated working with PT. Still has residual post-op pains  Physical Exam: Vitals:   09/29/24 2045 09/30/24 0300 09/30/24 0804 09/30/24 1630  BP: 116/65 111/73 (!) 98/59 100/71  Pulse: 76 92 (!) 110 (!) 102  Resp: 16  16 16   Temp: 98 F (36.7 C) 98 F (36.7 C) 97.8 F (36.6 C) 97.8 F (36.6 C)  TempSrc: Oral Oral    SpO2: 100% 100% 99% 96%  Weight:      Height:       General exam: Awake, sitting in chair, in nad Respiratory system: Normal respiratory effort, no wheezing Cardiovascular system: regular rate, s1, s2 Gastrointestinal system: Soft, nondistended, positive BS Central nervous system: CN2-12 grossly intact, strength  intact Extremities: Perfused, no clubbing Skin: Normal skin turgor, no notable skin lesions seen Psychiatry: Mood normal // affect normal  Data Reviewed:  Labs reviewed: Na 136, K 4.8, Cr 1.52, WBC 14.0, Hgb 8.2, Plts 113  Family Communication: Pt in room, family at bedside  Disposition: Status is: Inpatient Remains inpatient appropriate because: severity of illness  Planned Discharge Destination: Skilled nursing facility    Author: Garnette Pelt, MD 09/30/2024 5:50 PM  For on call review [email or web address].  "

## 2024-10-01 DIAGNOSIS — W19XXXA Unspecified fall, initial encounter: Secondary | ICD-10-CM | POA: Diagnosis not present

## 2024-10-01 DIAGNOSIS — S72001A Fracture of unspecified part of neck of right femur, initial encounter for closed fracture: Secondary | ICD-10-CM | POA: Diagnosis not present

## 2024-10-01 LAB — BASIC METABOLIC PANEL WITH GFR
Anion gap: 8 (ref 5–15)
BUN: 42 mg/dL — ABNORMAL HIGH (ref 8–23)
CO2: 21 mmol/L — ABNORMAL LOW (ref 22–32)
Calcium: 9.4 mg/dL (ref 8.9–10.3)
Chloride: 106 mmol/L (ref 98–111)
Creatinine, Ser: 1.67 mg/dL — ABNORMAL HIGH (ref 0.61–1.24)
GFR, Estimated: 39 mL/min — ABNORMAL LOW
Glucose, Bld: 138 mg/dL — ABNORMAL HIGH (ref 70–99)
Potassium: 4.7 mmol/L (ref 3.5–5.1)
Sodium: 135 mmol/L (ref 135–145)

## 2024-10-01 LAB — CBC
HCT: 24.4 % — ABNORMAL LOW (ref 39.0–52.0)
Hemoglobin: 8.1 g/dL — ABNORMAL LOW (ref 13.0–17.0)
MCH: 30.3 pg (ref 26.0–34.0)
MCHC: 33.2 g/dL (ref 30.0–36.0)
MCV: 91.4 fL (ref 80.0–100.0)
Platelets: 121 K/uL — ABNORMAL LOW (ref 150–400)
RBC: 2.67 MIL/uL — ABNORMAL LOW (ref 4.22–5.81)
RDW: 13.9 % (ref 11.5–15.5)
WBC: 15.9 K/uL — ABNORMAL HIGH (ref 4.0–10.5)
nRBC: 0 % (ref 0.0–0.2)

## 2024-10-01 MED ORDER — LIDOCAINE HCL 1 % IJ SOLN
5.0000 mL | Freq: Once | INTRAMUSCULAR | Status: DC
  Filled 2024-10-01 (×2): qty 5

## 2024-10-01 MED ORDER — LIDOCAINE 1% INJECTION FOR CIRCUMCISION
3.0000 mL | INJECTION | Freq: Once | INTRAVENOUS | Status: DC
  Filled 2024-10-01: qty 3

## 2024-10-01 NOTE — Progress Notes (Addendum)
 Physical Therapy Treatment Patient Details Name: Thomas [name] MRN: [id number] DOB: [birth year] Today's Date: 10/01/2024   History of Present Illness Pt is a 89 y.o. M who presents 09/28/2024 with a fall and displaced R femoral neck fx. S/p right total hip replacement through anterior approach. Significant PMH:  permanent atrial fibrillation on Eliquis , chronic HFpEF, severe aortic stenosis status post TAVR, OSA on CPAP, hypertension, hyperlipidemia, CKD 3B.    PT Comments  Pt making excellent progress towards his physical therapy goals in comparison to yesterday. Pt premedicated prior to session. Performed bed level HEP for RLE strengthening and ROM (written handout previously provided). Pt requiring light minA for RLE negotiation off edge of bed. Able to perform transfers off edge of bed and toilet without physical assist. Ambulating 60 ft with a RW with slowed step to pattern and chair follow. In light of progress, updating d/c recommendation to HHPT and discussed with pt family. Recommended increased supervision and assist at home at least for first week. Mobility specialist plans to see this PM for an additional walk.    If plan is discharge home, recommend the following: A little help with walking and/or transfers;A little help with bathing/dressing/bathroom;Assistance with cooking/housework;Assist for transportation;Help with stairs or ramp for entrance   Can travel by private vehicle     Yes  Equipment Recommendations  None recommended by PT (pt equipped)    Recommendations for Other Services       Precautions / Restrictions Precautions Precautions: Fall Restrictions Weight Bearing Restrictions Per Provider Order: No     Mobility  Bed Mobility Overal bed mobility: Needs Assistance Bed Mobility: Supine to Sit     Supine to sit: Min assist     General bed mobility comments: Very light minA for RLE management off edge of bed    Transfers Overall transfer level: Needs  assistance Equipment used: Rolling walker (2 wheels) Transfers: Sit to/from Stand Sit to Stand: Supervision           General transfer comment: Verbal cues for hand placement    Ambulation/Gait Ambulation/Gait assistance: +2 safety/equipment, Contact guard assist Gait Distance (Feet): 60 Feet Assistive device: Rolling walker (2 wheels) Gait Pattern/deviations: Step-to pattern, Decreased stance time - right, Decreased weight shift to right, Trunk flexed Gait velocity: decreased     General Gait Details: Verbal cues for shorter R step length, heel strike at initial contact, rolling walker rather than picking it up, upright posture. Chair follow utilized   Comptroller Bed    Modified Rankin (Stroke Patients Only)       Balance Overall balance assessment: Needs assistance Sitting-balance support: Feet supported Sitting balance-Leahy Scale: Fair     Standing balance support: Bilateral upper extremity supported, Reliant on assistive device for balance Standing balance-Leahy Scale: Poor                              Communication Communication Communication: Impaired Factors Affecting Communication: Hearing impaired  Cognition Arousal: Alert Behavior During Therapy: WFL for tasks assessed/performed   PT - Cognitive impairments: No apparent impairments                         Following commands: Intact      Cueing Cueing Techniques: Verbal cues  Exercises General Exercises - Lower Extremity Ankle Circles/Pumps: AROM,  Both, 10 reps, Supine Short Arc Quad: Right, AAROM, 10 reps, Supine Long Arc Quad: Right, 5 reps, Seated, AROM Heel Slides: AAROM, Right, 5 reps, Supine Hip ABduction/ADduction: AAROM, Right, 5 reps, Supine    General Comments        Pertinent Vitals/Pain Pain Assessment Pain Assessment: Faces Faces Pain Scale: Hurts even more Pain Location: R hip, knee (arthritic at  baseline) Pain Descriptors / Indicators: Discomfort, Grimacing, Operative site guarding Pain Intervention(s): Monitored during session, Premedicated before session    Home Living                          Prior Function            PT Goals (current goals can now be found in the care plan section) Acute Rehab PT Goals Patient Stated Goal: to return to independence PT Goal Formulation: With patient/family Time For Goal Achievement: 10/14/24 Potential to Achieve Goals: Good Progress towards PT goals: Progressing toward goals    Frequency    Min 3X/week      PT Plan      Co-evaluation              AM-PAC PT 6 Clicks Mobility   Outcome Measure  Help needed turning from your back to your side while in a flat bed without using bedrails?: A Little Help needed moving from lying on your back to sitting on the side of a flat bed without using bedrails?: A Little Help needed moving to and from a bed to a chair (including a wheelchair)?: A Little Help needed standing up from a chair using your arms (e.g., wheelchair or bedside chair)?: A Little Help needed to walk in hospital room?: A Little Help needed climbing 3-5 steps with a railing? : Total 6 Click Score: 16    End of Session Equipment Utilized During Treatment: Gait belt Activity Tolerance: Patient tolerated treatment well Patient left: in chair;with call bell/phone within reach;with chair alarm set Nurse Communication: Mobility status PT Visit Diagnosis: Unsteadiness on feet (R26.81);Other abnormalities of gait and mobility (R26.89);History of falling (Z91.81);Difficulty in walking, not elsewhere classified (R26.2);Pain Pain - Right/Left: Right Pain - part of body: Hip     Time: 0928-1000 PT Time Calculation (min) (ACUTE ONLY): 32 min  Charges:    $Gait Training: 8-22 mins $Therapeutic Exercise: 8-22 mins PT General Charges $$ ACUTE PT VISIT: 1 Visit                     Aleck Daring, PT,  DPT Acute Rehabilitation Services Office [phone]    Aleck ONEIDA Daring 10/01/2024, 10:40 AM

## 2024-10-01 NOTE — TOC Transition Note (Signed)
 Transition of Care [facility]) - Discharge Note   Patient Details  Name: Thomas [name] MRN: [id number] Date of Birth: 1934/11/13  Transition of Care [facility]) CM/SW Contact:  Rosalva Jon Bloch, RN Phone Number: 10/01/2024, 12:39 PM   Clinical Narrative:    Patient will DC to: home Anticipated DC date: 10/01/2024 Family notified: yes Transport by: car      S/p right total hip replacement  Per MD patient ready for DC today pending therapy clearance. RN, patient,  and patient's family aware of d/c plan. Pt agreeable to home health services.Pt without provider preference. Home health referral place via HUB, awaiting responses. Pt without DME needs. Pt without RX med or transportation issues. Post hospital f/u noted on AVS.  12/30  [facility] accepted for home health services.  RNCM will sign off for now as intervention is no longer needed. Please consult us  again if new needs arise.   Final next level of care: Home w Home Health Services Barriers to Discharge: No Barriers Identified   Patient Goals and CMS Choice     Choice offered to / list presented to : Patient      Discharge Placement                       Discharge Plan and Services Additional resources added to the After Visit Summary for                                       Social Drivers of Health (SDOH) Interventions SDOH Screenings   Food Insecurity: No Food Insecurity (09/29/2024)  Housing: Low Risk (09/29/2024)  Transportation Needs: No Transportation Needs (09/29/2024)  Utilities: Not At Risk (09/29/2024)  Social Connections: Moderately Isolated (09/29/2024)  Tobacco Use: Low Risk (09/29/2024)     Readmission Risk Interventions     No data to display

## 2024-10-01 NOTE — Progress Notes (Signed)
" °  Progress Note   Patient: Thomas [name] FMW:[id number] DOB: 18-May-1935 DOA: 09/28/2024     3 DOS: the patient was seen and examined on 10/01/2024   Brief hospital course: 89 y.o. M with smoldering MM s/p Velcade  and Revlimid  in 2019, prosCA s/p radX, pheochromocytoma s/p resection in 2010, as well as AS s/p TAVR in 2025, permAF on Eliquis , dCHF, stage IIIb baseline 1.4 who presented with fall and right hip fracture.  Assessment and Plan: Right hip fracture - Ortho following. Pt is now s/p surgery 12/28 -eliquis  resumed -PT/OT following, recs for SNF noted, however pt and family elected on home with HH   Right knee swelling X-ray suggests possible loose bodies - likely hemearthrosis, s/p aspiration this evening -Per ortho, plan to initiate 6 day pred dose pack starting tomorrow on d/c   Permanent atrial fibrillation with RVR - Held Eliquis  for surgery, later resumed -bp soft at this time. Placed hold parameters for metoprolol    Severe aortic stenosis status post TAVR last February   Chronic diastolic congestive heart failure Appears euvolemic -cont to hold torsemide for now given soft bp this AM   Stage IIIb chronic kidney disease Cr stable at baseline   History of multiple myeloma In surveillance with Dr. Gatha, no active treatment   History of prostate cancer, resolved History of pheochromocytoma, resolved   Normocytic anemia Mild - Attention postop      Subjective: Complaining of R knee pain this AM resulting in difficulty walking or participating with PT  Physical Exam: Vitals:   10/01/24 0353 10/01/24 0700 10/01/24 0749 10/01/24 1601  BP: 115/64  110/68 117/68  Pulse: 96  (!) 105 77  Resp: 17  16 16   Temp: 97.7 F (36.5 C)  97.8 F (36.6 C) 97.9 F (36.6 C)  TempSrc: Oral     SpO2: 98%  99% 99%  Weight:  81 kg    Height:       General exam: Conversant, in no acute distress Respiratory system: normal chest rise, clear, no audible  wheezing Cardiovascular system: regular rhythm, s1-s2 Gastrointestinal system: Nondistended, nontender, pos BS Central nervous system: No seizures, no tremors Extremities: No cyanosis, no joint deformities, R knee effusion Skin: No rashes, no pallor Psychiatry: Affect normal // no auditory hallucinations   Data Reviewed:  Labs reviewed: Na 135, K 4.7, Cr 1.67, WBC 15.9, hgb 8.1, Plts 121  Family Communication: Pt in room, family at bedside  Disposition: Status is: Inpatient Remains inpatient appropriate because: severity of illness  Planned Discharge Destination: Skilled nursing facility    Author: Garnette Pelt, MD 10/01/2024 5:51 PM  For on call review [email or web address].  "

## 2024-10-01 NOTE — Care Management Important Message (Signed)
 Important Message  Patient Details  Name: HAYWARD RYLANDER MRN: [id number] Date of Birth: 1935/07/20   Important Message Given:  Yes - Medicare IM     Jennie Laneta Dragon 10/01/2024, 1:11 PM

## 2024-10-01 NOTE — Progress Notes (Signed)
 "  Subjective: 2 Days Post-Op Procedures (LRB): ARTHROPLASTY, HIP, TOTAL, ANTERIOR APPROACH (Right) Patient reports pain as mild.   Patient seen in rounds for Dr. Ernie. Patient is resting in bed on exam this morning. No acute events overnight. He ambulated a short distance yesterday. He does complain of right knee pain, and tells me he had a visco series earlier this year. He did have his knee aspirated before this he tells me.  We will continue therapy today.   Objective: Vital signs in last 24 hours: Temp:  [97.7 F (36.5 C)-98.5 F (36.9 C)] 97.7 F (36.5 C) (12/30 0353) Pulse Rate:  [96-116] 96 (12/30 0353) Resp:  [16-17] 17 (12/30 0353) BP: (98-115)/(59-71) 115/64 (12/30 0353) SpO2:  [96 %-99 %] 98 % (12/30 0353)  Intake/Output from previous day:  Intake/Output Summary (Last 24 hours) at 10/01/2024 0647 Last data filed at 10/01/2024 0600 Gross per 24 hour  Intake 1352.92 ml  Output 950 ml  Net 402.92 ml     Intake/Output this shift: Total I/O In: -  Out: 400 [Urine:400]  Labs: Recent Labs    09/28/24 1929 09/28/24 2242 09/29/24 0640 09/30/24 0752 10/01/24 0436  HGB 10.7* 10.3* 10.2* 8.2* 8.1*   Recent Labs    09/30/24 0752 10/01/24 0436  WBC 14.0* 15.9*  RBC 2.80* 2.67*  HCT 25.2* 24.4*  PLT 113* 121*   Recent Labs    09/30/24 0316 10/01/24 0436  NA 136 135  K 4.8 4.7  CL 107 106  CO2 21* 21*  BUN 24* 42*  CREATININE 1.52* 1.67*  GLUCOSE 149* 138*  CALCIUM  8.7* 9.4   Recent Labs    09/28/24 1929  INR 1.2    Exam: General - Patient is Alert and Oriented Extremity - Neurologically intact Sensation intact distally Intact pulses distally Dorsiflexion/Plantar flexion intact  Right Knee: Prior anterior knee scar from open menisectomy in his 40s Large knee effusion Tenderness to palpation generally about the knee  Dressing - dressing C/D/I Motor Function - intact, moving foot and toes well on exam.   Past Medical History:   Diagnosis Date   BPH (benign prostatic hypertrophy)    Chronic diastolic heart failure (HCC) 12/27/2013   Chronic kidney disease    Hx of bilateral renal cysts.   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 Wonda and Dr. Maryjane   Severe aortic stenosis    Sinus node dysfunction (HCC) 08/14/2014   Sleep apnea    Smoldering multiple myeloma     Assessment/Plan: 2 Days Post-Op Procedures (LRB): ARTHROPLASTY, HIP, TOTAL, ANTERIOR APPROACH (Right) Principal Problem:   Closed displaced fracture of right femoral neck (HCC) Active Problems:   S/P total right hip arthroplasty  Estimated body mass index is 27.97 kg/m as calculated from the following:   Height as of this encounter: 5' 4 (1.626 m).   Weight as of  this encounter: 73.9 kg. Advance diet Up with therapy  DVT Prophylaxis - Eliquis  Weight bearing as tolerated.  Right knee with known advanced osteoarthritis, confirmed on x-ray and CT here Loose bodies in the popliteal space are not a concern Large effusion He would benefit from aspiration/injection which I will plan to do tomorrow   Up with PT as able Recommend for d/c home with family as opposed to SNF  Rosina Calin, PA-C Orthopedic Surgery [phone] 10/01/2024, 6:47 AM  "

## 2024-10-01 NOTE — Progress Notes (Signed)
 I returned this afternoon to aspirate his knee. We discussed this in detail, and he elected to proceed. Plans initially were to inject with cortisone at the same time, but he ended up having a hemarthrosis. We aspirated 70 cc bloody fluid from his knee without difficulty. He tolerated this well. We discussed that this is likely related to synovitis/trauma in the setting of chronic eliquis  use as well as his advanced OA. We held off on cortisone, but will initiate a 6 day prednisone dose pack tomorrow which he can complete on discharge.  F/U in our office in 2 weeks. We can inject at that time if no improvement.    Rosina Calin, PA-C Orthopedic Surgery [facility] [place] [phone]

## 2024-10-01 NOTE — Progress Notes (Signed)
 Mobility Specialist: Progress Note   10/01/24 1500  Mobility  Activity Ambulated with assistance  Level of Assistance Contact guard assist, steadying assist (+2, chair follow)  Assistive Device Front wheel walker  Distance Ambulated (ft) 90 ft  Activity Response Tolerated well  Mobility Referral Yes  Mobility visit 1 Mobility  Mobility Specialist Start Time (ACUTE ONLY) 1417  Mobility Specialist Stop Time (ACUTE ONLY) 1430  Mobility Specialist Time Calculation (min) (ACUTE ONLY) 13 min    Pt received in chair, pleasant and agreeable to mobility session. Daughter present and helpful. CGA for STS and ambulation. C/o 7/10 pain in RLE pain only during ambulation. Chair follow utilized. No complaints throughout. Returned to room by the recliner chair. Left in chair with all needs met, call bell in reach.   Ileana Lute Mobility Specialist Please contact via SecureChat or Rehab office at [phone]

## 2024-10-02 ENCOUNTER — Other Ambulatory Visit (HOSPITAL_COMMUNITY): Payer: Self-pay

## 2024-10-02 LAB — COMPREHENSIVE METABOLIC PANEL WITH GFR
ALT: <5 U/L (ref 0–44)
AST: 19 U/L (ref 15–41)
Albumin: 3.2 g/dL — ABNORMAL LOW (ref 3.5–5.0)
Alkaline Phosphatase: 88 U/L (ref 38–126)
Anion gap: 9 (ref 5–15)
BUN: 44 mg/dL — ABNORMAL HIGH (ref 8–23)
CO2: 20 mmol/L — ABNORMAL LOW (ref 22–32)
Calcium: 9.6 mg/dL (ref 8.9–10.3)
Chloride: 107 mmol/L (ref 98–111)
Creatinine, Ser: 1.52 mg/dL — ABNORMAL HIGH (ref 0.61–1.24)
GFR, Estimated: 44 mL/min — ABNORMAL LOW
Glucose, Bld: 97 mg/dL (ref 70–99)
Potassium: 4.3 mmol/L (ref 3.5–5.1)
Sodium: 136 mmol/L (ref 135–145)
Total Bilirubin: 0.5 mg/dL (ref 0.0–1.2)
Total Protein: 6 g/dL — ABNORMAL LOW (ref 6.5–8.1)

## 2024-10-02 LAB — CBC
HCT: 24.6 % — ABNORMAL LOW (ref 39.0–52.0)
Hemoglobin: 8 g/dL — ABNORMAL LOW (ref 13.0–17.0)
MCH: 29.4 pg (ref 26.0–34.0)
MCHC: 32.5 g/dL (ref 30.0–36.0)
MCV: 90.4 fL (ref 80.0–100.0)
Platelets: 144 K/uL — ABNORMAL LOW (ref 150–400)
RBC: 2.72 MIL/uL — ABNORMAL LOW (ref 4.22–5.81)
RDW: 14 % (ref 11.5–15.5)
WBC: 14.7 K/uL — ABNORMAL HIGH (ref 4.0–10.5)
nRBC: 0 % (ref 0.0–0.2)

## 2024-10-02 MED ORDER — PREDNISONE 5 MG (21) PO TBPK: 5.0000 mg | ORAL_TABLET | ORAL | Status: DC

## 2024-10-02 MED ORDER — PREDNISONE 5 MG (21) PO TBPK: 10.0000 mg | ORAL_TABLET | Freq: Every evening | ORAL | Status: DC

## 2024-10-02 MED ORDER — POLYETHYLENE GLYCOL 3350 17 GM/SCOOP PO POWD
17.0000 g | Freq: Every day | ORAL | 0 refills | Status: AC
  Filled 2024-10-02: qty 238, 14d supply, fill #0

## 2024-10-02 MED ORDER — METHOCARBAMOL 500 MG PO TABS
500.0000 mg | ORAL_TABLET | Freq: Four times a day (QID) | ORAL | 0 refills | Status: AC | PRN
  Filled 2024-10-02: qty 40, 10d supply, fill #0

## 2024-10-02 MED ORDER — PREDNISONE 5 MG (21) PO TBPK
10.0000 mg | ORAL_TABLET | Freq: Every morning | ORAL | Status: DC
  Filled 2024-10-02: qty 21

## 2024-10-02 MED ORDER — PREDNISONE 5 MG (21) PO TBPK: 5.0000 mg | ORAL_TABLET | Freq: Three times a day (TID) | ORAL | Status: DC

## 2024-10-02 MED ORDER — PREDNISONE 5 MG (21) PO TBPK: 5.0000 mg | ORAL_TABLET | Freq: Four times a day (QID) | ORAL | Status: DC

## 2024-10-02 MED ORDER — OXYCODONE HCL 5 MG PO TABS
2.5000 mg | ORAL_TABLET | ORAL | 0 refills | Status: AC | PRN
  Filled 2024-10-02: qty 30, 5d supply, fill #0

## 2024-10-02 MED ORDER — METHYLPREDNISOLONE 4 MG PO TBPK
ORAL_TABLET | ORAL | 0 refills | Status: AC
  Filled 2024-10-02: qty 21, 6d supply, fill #0

## 2024-10-02 MED ORDER — SENNA 8.6 MG PO TABS
1.0000 | ORAL_TABLET | Freq: Every day | ORAL | 0 refills | Status: AC
  Filled 2024-10-02: qty 30, 30d supply, fill #0

## 2024-10-02 NOTE — Progress Notes (Signed)
 Physical Therapy Treatment Patient Details Name: Thomas [name] MRN: [id number] DOB: [birth year] Today's Date: 10/02/2024   History of Present Illness Pt is a 89 y.o. M who presents 09/28/2024 with a fall and displaced R femoral neck fx. S/p right total hip replacement through anterior approach. Significant PMH:  permanent atrial fibrillation on Eliquis , chronic HFpEF, severe aortic stenosis status post TAVR, OSA on CPAP, hypertension, hyperlipidemia, CKD 3B.    PT Comments  Pt made excellent progress towards his physical therapy goals during his inpatient stay. Pt ambulating 100 ft with a RW with step to pattern without physical assist. Negotiated 1 platform step with a walker to simulate home entrance. Also simulated car transfer technique with good carryover. Provided pt family with gait belt and reviewed fall prevention techniques and home set up. Recommendation of HHPT remains appropriate.    If plan is discharge home, recommend the following: A little help with walking and/or transfers;A little help with bathing/dressing/bathroom;Assistance with cooking/housework;Assist for transportation;Help with stairs or ramp for entrance   Can travel by private vehicle     Yes  Equipment Recommendations  None recommended by PT (pt equipped)    Recommendations for Other Services       Precautions / Restrictions Precautions Precautions: Fall Restrictions Weight Bearing Restrictions Per Provider Order: No     Mobility  Bed Mobility Overal bed mobility: Modified Independent             General bed mobility comments: HOB flat, no rails, increased time    Transfers Overall transfer level: Needs assistance Equipment used: Rolling walker (2 wheels) Transfers: Sit to/from Stand Sit to Stand: Supervision                Ambulation/Gait Ambulation/Gait assistance: Supervision Gait Distance (Feet): 100 Feet Assistive device: Rolling walker (2 wheels) Gait Pattern/deviations:  Step-to pattern, Decreased stance time - right, Decreased weight shift to right, Trunk flexed Gait velocity: decreased     General Gait Details: Verbal cues for upright posture   Stairs Stairs: Yes Stairs assistance: Contact guard assist Stair Management: With walker, Forwards Number of Stairs: 1 General stair comments: Verbal cues for sequencing/technique   Wheelchair Mobility     Tilt Bed    Modified Rankin (Stroke Patients Only)       Balance Overall balance assessment: Needs assistance Sitting-balance support: Feet supported Sitting balance-Leahy Scale: Fair     Standing balance support: Bilateral upper extremity supported, Reliant on assistive device for balance Standing balance-Leahy Scale: Poor                              Communication Communication Communication: Impaired Factors Affecting Communication: Hearing impaired  Cognition Arousal: Alert Behavior During Therapy: WFL for tasks assessed/performed   PT - Cognitive impairments: No apparent impairments                         Following commands: Intact      Cueing Cueing Techniques: Verbal cues  Exercises      General Comments General comments (skin integrity, edema, etc.): Educated pt and family members on AE sock aide, reacher, and long handled sponge. Demonstrated use. Educated family on minimizing fall risk in the home and addressed concerns related to IADL engagement such as food preparation.      Pertinent Vitals/Pain Pain Assessment Pain Assessment: Faces Faces Pain Scale: Hurts little more Pain Location: R hip, knee (arthritic at  baseline) Pain Descriptors / Indicators: Discomfort, Grimacing, Operative site guarding Pain Intervention(s): Monitored during session, Premedicated before session    Home Living                          Prior Function            PT Goals (current goals can now be found in the care plan section) Acute Rehab PT  Goals Patient Stated Goal: to return to independence PT Goal Formulation: With patient/family Time For Goal Achievement: 10/14/24 Potential to Achieve Goals: Good Progress towards PT goals: Progressing toward goals    Frequency    Min 3X/week      PT Plan      Co-evaluation              AM-PAC PT 6 Clicks Mobility   Outcome Measure  Help needed turning from your back to your side while in a flat bed without using bedrails?: None Help needed moving from lying on your back to sitting on the side of a flat bed without using bedrails?: None Help needed moving to and from a bed to a chair (including a wheelchair)?: A Little Help needed standing up from a chair using your arms (e.g., wheelchair or bedside chair)?: A Little Help needed to walk in hospital room?: A Little Help needed climbing 3-5 steps with a railing? : A Little 6 Click Score: 20    End of Session Equipment Utilized During Treatment: Gait belt Activity Tolerance: Patient tolerated treatment well Patient left: in chair;with call bell/phone within reach;with chair alarm set Nurse Communication: Mobility status PT Visit Diagnosis: Unsteadiness on feet (R26.81);Other abnormalities of gait and mobility (R26.89);History of falling (Z91.81);Difficulty in walking, not elsewhere classified (R26.2);Pain Pain - Right/Left: Right Pain - part of body: Hip     Time: 1001-1031 PT Time Calculation (min) (ACUTE ONLY): 30 min  Charges:    $Gait Training: 8-22 mins $Therapeutic Activity: 8-22 mins PT General Charges $$ ACUTE PT VISIT: 1 Visit                     Aleck Daring, PT, DPT Acute Rehabilitation Services Office [phone]    Aleck ONEIDA Daring 10/02/2024, 11:46 AM

## 2024-10-02 NOTE — Plan of Care (Signed)

## 2024-10-02 NOTE — Progress Notes (Signed)
 Occupational Therapy Treatment Patient Details Name: Thomas [name] MRN: [id number] DOB: [birth year] Today's Date: 10/02/2024   History of present illness Pt is a 89 y.o. M who presents 09/28/2024 with a fall and displaced R femoral neck fx. S/p right total hip replacement through anterior approach. Significant PMH:  permanent atrial fibrillation on Eliquis , chronic HFpEF, severe aortic stenosis status post TAVR, OSA on CPAP, hypertension, hyperlipidemia, CKD 3B.   OT comments  Pt progressing well towards OT goals. Focus of session on education of AE for safe engagement in ADL tasks in the home, as well as minimizing fall risk. Pt and family at bedside educated and verbalized understanding of reacher, sock aide, and long handled sponge. Anticipate that with level of assist as outlined below, and HHOT services, Pt can d/c with the outlined recommendations once medically cleared.       If plan is discharge home, recommend the following:  A lot of help with walking and/or transfers;A lot of help with bathing/dressing/bathroom;Assist for transportation;Help with stairs or ramp for entrance;Assistance with cooking/housework   Equipment Recommendations  None recommended by OT    Recommendations for Other Services      Precautions / Restrictions Precautions Precautions: Fall Recall of Precautions/Restrictions: Intact Restrictions Weight Bearing Restrictions Per Provider Order: No       Mobility Bed Mobility               General bed mobility comments: Greeted in recliner, remained in recliner    Transfers                   General transfer comment: Pt respectfully declined mobility this session as he just worked with PT     Balance                                           ADL either performed or assessed with clinical judgement   ADL Overall ADL's : Needs assistance/impaired         Upper Body Bathing: Supervision/ safety;Sitting;With  adaptive equipment   Lower Body Bathing: Minimal assistance;With adaptive equipment;Sitting/lateral leans       Lower Body Dressing: Moderate assistance;With adaptive equipment;Sitting/lateral leans                      Extremity/Trunk Assessment Upper Extremity Assessment Upper Extremity Assessment: Overall WFL for tasks assessed            Vision       Perception     Praxis     Communication Communication Communication: Impaired Factors Affecting Communication: Hearing impaired   Cognition Arousal: Alert Behavior During Therapy: WFL for tasks assessed/performed Cognition: No apparent impairments                               Following commands: Intact        Cueing   Cueing Techniques: Verbal cues, Visual cues  Exercises      Shoulder Instructions       General Comments Educated pt and family members on AE sock aide, reacher, and long handled sponge. Demonstrated use. Educated family on minimizing fall risk in the home and addressed concerns related to IADL engagement such as food preparation.    Pertinent Vitals/ Pain       Pain Assessment Pain Assessment: Faces Faces  Pain Scale: Hurts a little bit Pain Location: R hip with mobility Pain Descriptors / Indicators: Discomfort Pain Intervention(s): Limited activity within patient's tolerance, Monitored during session  Home Living                                          Prior Functioning/Environment              Frequency  Min 2X/week        Progress Toward Goals  OT Goals(current goals can now be found in the care plan section)  Progress towards OT goals: Progressing toward goals  Acute Rehab OT Goals Patient Stated Goal: to go home OT Goal Formulation: With patient Time For Goal Achievement: 10/14/24 Potential to Achieve Goals: Good ADL Goals Pt Will Perform Lower Body Bathing: with modified independence;with adaptive equipment Pt Will  Perform Lower Body Dressing: with min assist;with adaptive equipment;sitting/lateral leans Pt Will Transfer to Toilet: with contact guard assist;ambulating;bedside commode Pt Will Perform Toileting - Clothing Manipulation and hygiene: with contact guard assist;sitting/lateral leans  Plan      Co-evaluation                 AM-PAC OT 6 Clicks Daily Activity     Outcome Measure   Help from another person eating meals?: A Little Help from another person taking care of personal grooming?: A Little Help from another person toileting, which includes using toliet, bedpan, or urinal?: A Lot Help from another person bathing (including washing, rinsing, drying)?: A Little Help from another person to put on and taking off regular upper body clothing?: A Little Help from another person to put on and taking off regular lower body clothing?: A Lot 6 Click Score: 16    End of Session    OT Visit Diagnosis: Unsteadiness on feet (R26.81);Muscle weakness (generalized) (M62.81);History of falling (Z91.81);Pain Pain - Right/Left: Right Pain - part of body: Hip   Activity Tolerance Patient tolerated treatment well   Patient Left in chair;with call bell/phone within reach;with chair alarm set;with family/visitor present   Nurse Communication          Time: 8953-8877 OT Time Calculation (min): 36 min  Charges: OT General Charges $OT Visit: 1 Visit OT Treatments $Self Care/Home Management : 23-37 mins  Maurilio CROME, OTR/L.  [facility] Acute Rehabilitation  Office: [phone]   Maurilio PARAS Dehlia Kilner 10/02/2024, 11:42 AM

## 2024-10-02 NOTE — Discharge Summary (Signed)
 Physician Discharge Summary  Thomas [name] FMW:[id number] DOB: 10/08/1934 DOA: 09/28/2024  PCP: Teresa Channel, MD  Admit date: 09/28/2024 Discharge date: 10/02/2024  Admitted From: Home Disposition: Home  Recommendations for Outpatient Follow-up:  Follow up with PCP in 1-2 weeks Please obtain BMP/CBC in one week your next doctors visit.  Pain medications with bowel regimen Medrol Dosepak Outpatient follow-up with orthopedic   Discharge Condition: Stable CODE STATUS: Full code Diet recommendation: Cardiac  Brief/Interim Summary: Brief Narrative:   89 y.o. M with smoldering MM s/p Velcade  and Revlimid  in 2019, prosCA s/p radX, pheochromocytoma s/p resection in 2010, as well as AS s/p TAVR in 2025, permAF on Eliquis , dCHF, stage IIIb baseline 1.4 who presented with fall and right hip fracture.  Patient underwent ORIF and did well.  PT OT recommended SNF but family opted for home health services.  Also had right knee swelling for which it was aspirated and showed hemarthrosis.  Orthopedic recommended discharging on Medrol Dosepak.    Assessment & Plan:  Right hip fracture -Status post ORIF.  Eliquis  resumed.  PT recommended SNF but family elected HH   Right knee swelling -Status post aspiration this evening showing some hemarthrosis.  6 days of prednisone pack per orthopedic   Permanent atrial fibrillation with RVR -On Toprol -XL and Eliquis    Severe aortic stenosis status post TAVR last February   Chronic diastolic congestive heart failure Appears euvolemic -Resume home meds upon discharge   Stage IIIb chronic kidney disease Cr stable at baseline   History of multiple myeloma In surveillance with Dr. Gatha, no active treatment   History of prostate cancer, resolved History of pheochromocytoma, resolved   Normocytic anemia Mild - Attention postop   DVT prophylaxis: Eliquis      Code Status: Full Code Family Communication:   Status is: Inpatient Remains  inpatient appropriate because: Plans to hopefully discharge home today   PT Follow up Recs: Skilled Nursing-Short Term Rehab (<3 Hours/Day)10/01/2024 1037  Subjective:  Seen at bedside no complaints Family present  Examination:  General exam: Appears calm and comfortable  Respiratory system: Clear to auscultation. Respiratory effort normal. Cardiovascular system: S1 & S2 heard, RRR. No JVD, murmurs, rubs, gallops or clicks. No pedal edema. Gastrointestinal system: Abdomen is nondistended, soft and nontender. No organomegaly or masses felt. Normal bowel sounds heard. Central nervous system: Alert and oriented. No focal neurological deficits. Extremities: Symmetric 5 x 5 power..  Mild right sided knee swelling Skin: No rashes, lesions or ulcers Psychiatry: Judgement and insight appear normal. Mood & affect appropriate.    Discharge Diagnoses:  Principal Problem:   Closed displaced fracture of right femoral neck (HCC) Active Problems:   S/P total right hip arthroplasty      Discharge Exam: Vitals:   10/02/24 0600 10/02/24 0806  BP: 117/70 103/67  Pulse: 65 (!) 107  Resp: 18 17  Temp: 97.8 F (36.6 C) 97.8 F (36.6 C)  SpO2: 100% 100%   Vitals:   10/01/24 1601 10/01/24 2030 10/02/24 0600 10/02/24 0806  BP: 117/68 114/78 117/70 103/67  Pulse: 77 98 65 (!) 107  Resp: 16 16 18 17   Temp: 97.9 F (36.6 C) 98.2 F (36.8 C) 97.8 F (36.6 C) 97.8 F (36.6 C)  TempSrc:  Oral    SpO2: 99% 98% 100% 100%  Weight:      Height:          Discharge Instructions   Allergies as of 10/02/2024       Reactions  Benadryl [diphenhydramine Hcl] Other (See Comments)   Bad dreams -  Hallucinations.   Ceftriaxone Sodium Hives   Nsaids Other (See Comments)   REACTION: BLEEDING ULCER   Penicillins Hives   Farxiga  [dapagliflozin ] Rash   Codeine    loopy   Doxycycline Nausea And Vomiting   Stomach pain   Ibuprofen Hives   Was told to stay off it by Dr. Claudene.   Levaquin  [levofloxacin]    Unknown reaction    Oxybutynin Other (See Comments)   Lightheaded, dizzy, unbalanced. Disturbed my equilibrium.   Tramadol Hcl    Other reaction(s): dizzy/nauseated   Chlorhexidine  Rash   Use alcohol for skin prep per patient request.    Furosemide  Diarrhea, Itching, Rash        Medication List     TAKE these medications    acetaminophen  500 MG tablet Commonly known as: TYLENOL  Take 1,000 mg by mouth 2 (two) times daily.   apixaban  5 MG Tabs tablet Commonly known as: Eliquis  Take 1 tablet (5 mg total) by mouth 2 (two) times daily.   cholecalciferol 25 MCG (1000 UNIT) tablet Commonly known as: VITAMIN D3 Take 1,000 Units by mouth daily.   dorzolamide -timolol  2-0.5 % ophthalmic solution Commonly known as: COSOPT  Place 1 drop into both eyes 2 (two) times daily.   latanoprost  0.005 % ophthalmic solution Commonly known as: XALATAN  Place 1 drop into both eyes at bedtime.   methocarbamol  500 MG tablet Commonly known as: ROBAXIN  Take 1 tablet (500 mg total) by mouth every 6 (six) hours as needed for muscle spasms (thigh pain).   methylPREDNISolone 4 MG Tbpk tablet Commonly known as: MEDROL DOSEPAK Take 6 tablets by mouth day 1, 5 tablets by mouth day 2, 4 tablets by mouth day 3, 3 tablets by mouth day 4, 2 tablets by mouth day 5,.1 tablet by mouth day 6. Take with food in the morning.   metoprolol  succinate 25 MG 24 hr tablet Commonly known as: TOPROL -XL TAKE 1 TABLET (25 MG TOTAL) BY MOUTH DAILY. What changed:  how much to take when to take this   oxyCODONE  5 MG immediate release tablet Commonly known as: Oxy IR/ROXICODONE  Take 0.5-1 tablets (2.5-5 mg total) by mouth every 4 (four) hours as needed for severe pain (pain score 7-10).   polyethylene glycol powder 17 GM/SCOOP powder Commonly known as: GLYCOLAX /MIRALAX  Take 17 g by mouth daily. Dissolve 1 capful (17g) in 4-8 ounces of liquid and take by mouth daily.   senna 8.6 MG Tabs  tablet Commonly known as: SENOKOT Take 1 tablet (8.6 mg total) by mouth daily.        Contact information for follow-up providers     Ernie Cough, MD. Schedule an appointment as soon as possible for a visit in 2 week(s).   Specialty: Orthopedic Surgery Contact information: [address] [place] [address] [place] [address] [phone]         Teresa Channel, MD Follow up.   Specialty: Family Medicine Contact information: [address] [place] [address] [phone]         [facility]) Follow up.   Specialty: Home Health Services Why: [facility] will provide home health services, start of care within 48 post discharge Contact information: [address] [place] [address] [place]  [address] [phone]             Contact information for after-discharge care     Home Medical  Care     [facility]) .   Service: Home Health Services Contact information: [address] [place]  [address] [phone]                    Allergies[1]  You were cared for by a hospitalist during your hospital stay. If you have any questions about your discharge medications or the care you received while you were in the hospital after you are discharged, you can call the unit and asked to speak with the hospitalist on call if the hospitalist that took care of you is not available. Once you are discharged, your primary care physician will handle any further medical issues. Please note that no refills for any discharge medications will be authorized once you are discharged, as it is imperative that you return to your primary care physician (or establish a relationship with a primary care physician if you do not have one) for your aftercare needs so that they can reassess your need for medications and monitor your lab values.  You  were cared for by a hospitalist during your hospital stay. If you have any questions about your discharge medications or the care you received while you were in the hospital after you are discharged, you can call the unit and asked to speak with the hospitalist on call if the hospitalist that took care of you is not available. Once you are discharged, your primary care physician will handle any further medical issues. Please note that NO REFILLS for any discharge medications will be authorized once you are discharged, as it is imperative that you return to your primary care physician (or establish a relationship with a primary care physician if you do not have one) for your aftercare needs so that they can reassess your need for medications and monitor your lab values.  Please request your Prim.MD to go over all Hospital Tests and Procedure/Radiological results at the follow up, please get all Hospital records sent to your Prim MD by signing hospital release before you go home.  Get CBC, CMP, 2 view Chest X ray checked  by Primary MD during your next visit or SNF MD in 5-7 days ( we routinely change or add medications that can affect your baseline labs and fluid status, therefore we recommend that you get the mentioned basic workup next visit with your PCP, your PCP may decide not to get them or add new tests based on their clinical decision)  On your next visit with your primary care physician please Get Medicines reviewed and adjusted.  If you experience worsening of your admission symptoms, develop shortness of breath, life threatening emergency, suicidal or homicidal thoughts you must seek medical attention immediately by calling 911 or calling your MD immediately  if symptoms less severe.  You Must read complete instructions/literature along with all the possible adverse reactions/side effects for all the Medicines you take and that have been prescribed to you. Take any new Medicines after you have  completely understood and accpet all the possible adverse reactions/side effects.   Do not drive, operate heavy machinery, perform activities at heights, swimming or participation in water activities or provide baby sitting services if your were admitted for syncope or siezures until you have seen by Primary MD or a Neurologist and advised to do so again.  Do not drive when taking Pain medications.   Procedures/Studies: DG Pelvis Portable Result Date: 09/29/2024 CLINICAL DATA:  Status post hip arthroplasty. EXAM: PORTABLE PELVIS 1-2 VIEWS COMPARISON:  Preoperative radiograph FINDINGS: Right hip arthroplasty in expected alignment. No periprosthetic lucency or fracture. Recent postsurgical change includes air and edema in the soft tissues. IMPRESSION: Right hip arthroplasty without immediate postoperative complication. Electronically Signed   By: Andrea Gasman M.D.   On: 09/29/2024 18:15   DG HIP UNILAT WITH PELVIS 1V RIGHT Result Date: 09/29/2024 CLINICAL DATA:  Elective surgery. EXAM: DG HIP (WITH OR WITHOUT PELVIS) 1V RIGHT COMPARISON:  Preoperative radiograph FINDINGS: Seven fluoroscopic spot views of the pelvis and right hip obtained in the operating room. Sequential images during hip arthroplasty. Fluoroscopy time 10.6 seconds. Dose 1.34 mGy. IMPRESSION: Intraoperative fluoroscopy during right hip arthroplasty. Electronically Signed   By: Andrea Gasman M.D.   On: 09/29/2024 18:15   DG C-Arm 1-60 Min-No Report Result Date: 09/29/2024 Fluoroscopy was utilized by the requesting physician.  No radiographic interpretation.   DG C-Arm 1-60 Min-No Report Result Date: 09/29/2024 Fluoroscopy was utilized by the requesting physician.  No radiographic interpretation.   CT KNEE RIGHT WO CONTRAST Result Date: 09/29/2024 CLINICAL DATA:  Knee trauma.  Fall. EXAM: CT OF THE RIGHT KNEE WITHOUT CONTRAST TECHNIQUE: Multidetector CT imaging of the right knee was performed according to the standard  protocol. Multiplanar CT image reconstructions were also generated. RADIATION DOSE REDUCTION: This exam was performed according to the departmental dose-optimization program which includes automated exposure control, adjustment of the mA and/or kV according to patient size and/or use of iterative reconstruction technique. COMPARISON:  Right femur radiographs dated 09/28/2024. FINDINGS: Bones/Joint/Cartilage No acute fracture or dislocation. Severe medial femoral compartment joint space narrowing resulting in near bone-on-bone contact. Tricompartmental osteophytosis with chondrocalcinosis in the medial and lateral femorotibial compartments. There is a moderate-sized joint effusion with multiple calcified intra-articular bodies within the suprapatellar recess as well as the posterior joint space within a Baker's cyst which likely communicates with the joint space. Ligaments Ligaments are suboptimally evaluated by CT. Muscles and Tendons Quadriceps and patellar tendons appear grossly intact. Mild generalized atrophy of the posterior compartment musculature of the proximal calf. Soft tissue Subcutaneous edema along the visualized proximal posterior calf. No loculated fluid collection. Peripheral vascular calcification. IMPRESSION: 1. No acute osseous abnormality. 2. Medial femorotibial compartment predominant osteoarthritis with severe joint space narrowing resulting in near bone-on-bone contact. Chondrocalcinosis in the medial and lateral femorotibial compartments. 3. Moderate-sized knee joint effusion with multiple calcified intra-articular bodies within the suprapatellar recess and the posterior joint space within a Baker's cyst which likely communicates with the joint space. 4. Subcutaneous edema along the visualized proximal posterior calf. Electronically Signed   By: Harrietta Sherry M.D.   On: 09/29/2024 11:13   DG Pelvis Portable Result Date: 09/28/2024 EXAM: 1 OR 2 VIEW(S) XRAY OF THE PELVIS 09/28/2024  07:27:00 PM COMPARISON: None available. CLINICAL HISTORY: fall FINDINGS: BONES AND JOINTS: Acute displaced right femoral neck fracture with varus angulation. No right hip dislocation. No acute displaced fracture or dislocation of the left hip. No acute displaced fracture or dislocation of the bones of the pelvis. Poorly visualized sacrum - limited evaluation due to overlapping osseous structures and overlying soft tissues. Degenerative changes of the visualized lower lumbar spine. SOFT TISSUES: Surgical clips overlying the pelvis. The remaining visualized soft tissues are unremarkable. IMPRESSION: 1. Acute displaced right femoral neck fracture. 2. Limited evaluation of the sacrum. Electronically signed by: Morgane Naveau MD 09/28/2024 08:46 PM EST RP Workstation: [id number]   CT Cervical Spine Wo Contrast Result Date: 09/28/2024  EXAM: CT CERVICAL SPINE WITHOUT CONTRAST 09/28/2024 08:15:42 PM TECHNIQUE: CT of the cervical spine was performed without the administration of intravenous contrast. Multiplanar reformatted images are provided for review. Automated exposure control, iterative reconstruction, and/or weight based adjustment of the mA/kV was utilized to reduce the radiation dose to as low as reasonably achievable. COMPARISON: None available. CLINICAL HISTORY: Neck trauma (Age >= 65y) FINDINGS: BONES AND ALIGNMENT: No acute fracture or traumatic malalignment. DEGENERATIVE CHANGES: Multilevel moderate-to-severe degenerative change of the spine most prominent at the C5-C7 levels. Moderate-to-severe left C3-C4 osseous neural foraminal stenosis. No severe osseous central canal stenosis. SOFT TISSUES: No prevertebral soft tissue swelling. IMPRESSION: 1. No evidence of acute traumatic injury. Electronically signed by: Morgane Naveau MD 09/28/2024 08:45 PM EST RP Workstation: [id number]   CT HEAD WO CONTRAST ( ) Result Date: 09/28/2024 EXAM: CT HEAD WITHOUT CONTRAST 09/28/2024 08:15:42 PM TECHNIQUE: CT of the  head was performed without the administration of intravenous contrast. Automated exposure control, iterative reconstruction, and/or weight based adjustment of the mA/kV was utilized to reduce the radiation dose to as low as reasonably achievable. COMPARISON: CT head 01/23/2022. CLINICAL HISTORY: Head trauma, minor (Age >= 65y) FINDINGS: BRAIN AND VENTRICLES: No acute hemorrhage. No evidence of acute infarct. No hydrocephalus. No extra-axial collection. No mass effect or midline shift. Mild periventricular white matter disease. ORBITS: Bilateral lens replacement. SINUSES: No acute abnormality. SOFT TISSUES AND SKULL: No acute soft tissue abnormality. No skull fracture. VASCULATURE: Mild calcific atheromatous disease within carotid siphons. IMPRESSION: 1. No acute intracranial abnormality related to trauma. Electronically signed by: Morgane Naveau MD 09/28/2024 08:44 PM EST RP Workstation: [id number]   DG Femur Min 2 Views Right Result Date: 09/28/2024 EXAM: 2 VIEW(S) XRAY OF THE RIGHT FEMUR 09/28/2024 07:27:00 PM COMPARISON: None available. CLINICAL HISTORY: fall FINDINGS: BONES AND JOINTS: Acute displaced right femoral neck fracture. No acute fracture or mortise distally. Suprapatellar knee joint effusion with intra-articular loose bodies anteriorly along the joint space as well as posteriorly along the popliteal fossa. Mild-to-moderate tricompartmental degenerative changes of the knee. SOFT TISSUES: Vascular calcifications. IMPRESSION: 1. Acute displaced right femoral neck fracture. 2. Suprapatellar knee joint effusion with intra-articular loose bodies. Electronically signed by: Morgane Naveau MD 09/28/2024 08:36 PM EST RP Workstation: [id number]   DG Chest Port 1 View Result Date: 09/28/2024 EXAM: 1 VIEW(S) XRAY OF THE CHEST 09/28/2024 07:27:00 PM COMPARISON: 11/24/2023 CLINICAL HISTORY: fall FINDINGS: LUNGS AND PLEURA: No focal pulmonary opacity. No pleural effusion. No pneumothorax. HEART AND  MEDIASTINUM: Surgical clips at GE junction. Tortuous thoracic aorta. TAVR noted. BONES AND SOFT TISSUES: No acute osseous abnormality. IMPRESSION: 1. No acute process. Electronically signed by: Morgane Naveau MD 09/28/2024 08:25 PM EST RP Workstation: [id number]     The results of significant diagnostics from this hospitalization (including imaging, microbiology, ancillary and laboratory) are listed below for reference.     Microbiology: Recent Results (from the past 240 hours)  Surgical pcr screen     Status: None   Collection Time: 09/29/24  4:21 PM   Specimen: Nasal Mucosa; Nasal Swab  Result Value Ref Range Status   MRSA, PCR NEGATIVE NEGATIVE Final   Staphylococcus aureus NEGATIVE NEGATIVE Final    Comment: (NOTE) The Xpert SA Assay (FDA approved for NASAL specimens in patients 33 years of age and older), is one component of a comprehensive surveillance program. It is not intended to diagnose infection nor to guide or monitor treatment. Performed at [facility], [address] [place], [place] [address]  Labs: BNP (last 3 results) No results for input(s): BNP in the last 8760 hours. Basic Metabolic Panel: Recent Labs  Lab 09/28/24 1929 09/29/24 0640 09/30/24 0316 10/01/24 0436 10/02/24 0558  NA 141 137 136 135 136  K 4.5 4.6 4.8 4.7 4.3  CL 111 109 107 106 107  CO2 21* 20* 21* 21* 20*  GLUCOSE 119* 122* 149* 138* 97  BUN 24* 22 24* 42* 44*  CREATININE 1.48* 1.43* 1.52* 1.67* 1.52*  CALCIUM  9.6 9.2 8.7* 9.4 9.6  MG  --  1.8  --   --   --   PHOS  --  2.8  --   --   --    Liver Function Tests: Recent Labs  Lab 09/28/24 1929 09/29/24 0640 09/30/24 0316 10/02/24 0558  AST 27 31 27 19   ALT 11 15 14  <5  ALKPHOS 129* 113 78 88  BILITOT 0.6 0.7 0.5 0.5  PROT 7.0 6.5 5.7* 6.0*  ALBUMIN  4.0 3.7 3.4* 3.2*   No results for input(s): LIPASE, AMYLASE in the last 168 hours. No results for input(s): AMMONIA in the last 168 hours. CBC: Recent  Labs  Lab 09/28/24 1929 09/28/24 2242 09/29/24 0640 09/30/24 0752 10/01/24 0436 10/02/24 0558  WBC 10.5 10.6* 10.3 14.0* 15.9* 14.7*  NEUTROABS 8.9*  --  8.2*  --   --   --   HGB 10.7* 10.3* 10.2* 8.2* 8.1* 8.0*  HCT 33.2* 32.1* 31.5* 25.2* 24.4* 24.6*  MCV 93.3 92.8 92.1 90.0 91.4 90.4  PLT 179 162 147* 113* 121* 144*   Cardiac Enzymes: No results for input(s): CKTOTAL, CKMB, CKMBINDEX, TROPONINI in the last 168 hours. BNP: Invalid input(s): POCBNP CBG: No results for input(s): GLUCAP in the last 168 hours. D-Dimer No results for input(s): DDIMER in the last 72 hours. Hgb A1c No results for input(s): HGBA1C in the last 72 hours. Lipid Profile No results for input(s): CHOL, HDL, LDLCALC, TRIG, CHOLHDL, LDLDIRECT in the last 72 hours. Thyroid  function studies No results for input(s): TSH, T4TOTAL, T3FREE, THYROIDAB in the last 72 hours.  Invalid input(s): FREET3 Anemia work up No results for input(s): VITAMINB12, FOLATE, FERRITIN, TIBC, IRON, RETICCTPCT in the last 72 hours. Urinalysis    Component Value Date/Time   COLORURINE YELLOW 11/24/2023 0920   APPEARANCEUR HAZY (A) 11/24/2023 0920   LABSPEC 1.019 11/24/2023 0920   PHURINE 5.0 11/24/2023 0920   GLUCOSEU NEGATIVE 11/24/2023 0920   HGBUR LARGE (A) 11/24/2023 0920   BILIRUBINUR NEGATIVE 11/24/2023 0920   KETONESUR NEGATIVE 11/24/2023 0920   PROTEINUR 100 (A) 11/24/2023 0920   UROBILINOGEN 1.0 12/05/2009 1637   NITRITE NEGATIVE 11/24/2023 0920   LEUKOCYTESUR MODERATE (A) 11/24/2023 0920   Sepsis Labs Recent Labs  Lab 09/29/24 0640 09/30/24 0752 10/01/24 0436 10/02/24 0558  WBC 10.3 14.0* 15.9* 14.7*   Microbiology Recent Results (from the past 240 hours)  Surgical pcr screen     Status: None   Collection Time: 09/29/24  4:21 PM   Specimen: Nasal Mucosa; Nasal Swab  Result Value Ref Range Status   MRSA, PCR NEGATIVE NEGATIVE Final   Staphylococcus  aureus NEGATIVE NEGATIVE Final    Comment: (NOTE) The Xpert SA Assay (FDA approved for NASAL specimens in patients 69 years of age and older), is one component of a comprehensive surveillance program. It is not intended to diagnose infection nor to guide or monitor treatment. Performed at [facility], [address] [place], [place] [address]  Time coordinating discharge:  I have spent 35 minutes face to face with the patient and on the ward discussing the patients care, assessment, plan and disposition with other care givers. >50% of the time was devoted counseling the patient about the risks and benefits of treatment/Discharge disposition and coordinating care.   SIGNED:   Burgess JAYSON Dare, MD  [facility] 10/02/2024, 11:49 AM   If 7PM-7AM, please contact night-coverage      [1]  Allergies Allergen Reactions   Benadryl [Diphenhydramine Hcl] Other (See Comments)    Bad dreams -  Hallucinations.   Ceftriaxone Sodium Hives   Nsaids Other (See Comments)    REACTION: BLEEDING ULCER   Penicillins Hives   Farxiga  [Dapagliflozin ] Rash   Codeine     loopy   Doxycycline Nausea And Vomiting    Stomach pain   Ibuprofen Hives    Was told to stay off it by Dr. Claudene.   Levaquin [Levofloxacin]     Unknown reaction    Oxybutynin Other (See Comments)    Lightheaded, dizzy, unbalanced. Disturbed my equilibrium.   Tramadol Hcl     Other reaction(s): dizzy/nauseated   Chlorhexidine  Rash    Use alcohol for skin prep per patient request.    Furosemide  Diarrhea, Itching and Rash

## 2024-10-02 NOTE — Progress Notes (Signed)
 DISCHARGE NOTE HOME Thomas [name] to be discharged Home per MD order. Discussed prescriptions and follow up appointments with the patient. Prescriptions given to patient; medication list explained in detail. Patient verbalized understanding.  Skin clean, dry and intact without evidence of skin break down, no evidence of skin tears noted. IV catheter discontinued intact. Site without signs and symptoms of complications. Dressing and pressure applied. Pt denies pain at the site currently. No complaints noted.  See Lda for surgical incision right hip at discharge Patient free of lines, drains, and wounds.   An After Visit Summary (AVS) was printed and given to the patient. Patient escorted via wheelchair, and discharged home via private auto.  Peyton SHAUNNA Pepper, RN

## 2024-10-11 ENCOUNTER — Other Ambulatory Visit (HOSPITAL_BASED_OUTPATIENT_CLINIC_OR_DEPARTMENT_OTHER): Payer: Self-pay | Admitting: Family Medicine

## 2024-10-11 DIAGNOSIS — M8000XD Age-related osteoporosis with current pathological fracture, unspecified site, subsequent encounter for fracture with routine healing: Secondary | ICD-10-CM

## 2024-10-16 ENCOUNTER — Other Ambulatory Visit: Payer: Self-pay | Admitting: Cardiovascular Disease

## 2024-10-16 NOTE — Telephone Encounter (Signed)
 Prescription refill request for Eliquis  received. Indication:afib Last office visit:7/25 Scr: 1.52  12/25 Age:89 Weight:81  kg  Under review

## 2024-11-06 ENCOUNTER — Ambulatory Visit (HOSPITAL_BASED_OUTPATIENT_CLINIC_OR_DEPARTMENT_OTHER)
Admission: RE | Admit: 2024-11-06 | Discharge: 2024-11-06 | Disposition: A | Source: Ambulatory Visit | Attending: Family Medicine | Admitting: Family Medicine

## 2024-11-06 DIAGNOSIS — M8000XD Age-related osteoporosis with current pathological fracture, unspecified site, subsequent encounter for fracture with routine healing: Secondary | ICD-10-CM

## 2024-11-28 ENCOUNTER — Other Ambulatory Visit (HOSPITAL_COMMUNITY)

## 2024-11-28 ENCOUNTER — Ambulatory Visit: Admitting: Physician Assistant

## 2024-12-26 ENCOUNTER — Ambulatory Visit: Admitting: Physician Assistant

## 2025-01-13 ENCOUNTER — Inpatient Hospital Stay: Attending: Internal Medicine

## 2025-01-20 ENCOUNTER — Inpatient Hospital Stay: Admitting: Internal Medicine
